# Patient Record
Sex: Male | Born: 1935 | Race: White | Hispanic: No | Marital: Married | Smoking: Former smoker
Health system: Southern US, Community
[De-identification: ages and names within clinical notes are randomized; demographics above are authoritative.]

## PROBLEM LIST (undated history)

## (undated) DIAGNOSIS — R001 Bradycardia, unspecified: Secondary | ICD-10-CM

## (undated) DIAGNOSIS — N183 Chronic kidney disease, stage 3 unspecified: Secondary | ICD-10-CM

## (undated) DIAGNOSIS — Z87891 Personal history of nicotine dependence: Secondary | ICD-10-CM

## (undated) DIAGNOSIS — I5020 Unspecified systolic (congestive) heart failure: Secondary | ICD-10-CM

## (undated) DIAGNOSIS — M1990 Unspecified osteoarthritis, unspecified site: Secondary | ICD-10-CM

## (undated) DIAGNOSIS — E039 Hypothyroidism, unspecified: Secondary | ICD-10-CM

## (undated) DIAGNOSIS — I714 Abdominal aortic aneurysm, without rupture, unspecified: Secondary | ICD-10-CM

## (undated) DIAGNOSIS — N400 Enlarged prostate without lower urinary tract symptoms: Secondary | ICD-10-CM

## (undated) DIAGNOSIS — I428 Other cardiomyopathies: Secondary | ICD-10-CM

## (undated) DIAGNOSIS — I10 Essential (primary) hypertension: Secondary | ICD-10-CM

## (undated) DIAGNOSIS — K859 Acute pancreatitis without necrosis or infection, unspecified: Secondary | ICD-10-CM

## (undated) DIAGNOSIS — I2510 Atherosclerotic heart disease of native coronary artery without angina pectoris: Secondary | ICD-10-CM

## (undated) DIAGNOSIS — I4819 Other persistent atrial fibrillation: Secondary | ICD-10-CM

## (undated) HISTORY — DX: Other persistent atrial fibrillation: I48.19

## (undated) HISTORY — DX: Other cardiomyopathies: I42.8

## (undated) HISTORY — PX: TONSILLECTOMY: SUR1361

## (undated) HISTORY — PX: EYE SURGERY: SHX253

---

## 1978-10-18 HISTORY — PX: CATARACT EXTRACTION W/ INTRAOCULAR LENS  IMPLANT, BILATERAL: SHX1307

## 2000-11-01 ENCOUNTER — Ambulatory Visit (HOSPITAL_COMMUNITY): Admission: RE | Admit: 2000-11-01 | Discharge: 2000-11-01 | Payer: Self-pay | Admitting: Internal Medicine

## 2000-11-01 ENCOUNTER — Encounter: Payer: Self-pay | Admitting: Internal Medicine

## 2001-04-11 ENCOUNTER — Encounter: Payer: Self-pay | Admitting: Urology

## 2001-04-11 ENCOUNTER — Ambulatory Visit (HOSPITAL_COMMUNITY): Admission: RE | Admit: 2001-04-11 | Discharge: 2001-04-11 | Payer: Self-pay | Admitting: Urology

## 2005-12-10 ENCOUNTER — Ambulatory Visit (HOSPITAL_COMMUNITY): Admission: RE | Admit: 2005-12-10 | Discharge: 2005-12-10 | Payer: Self-pay | Admitting: Family Medicine

## 2006-12-07 ENCOUNTER — Ambulatory Visit: Payer: Self-pay | Admitting: Vascular Surgery

## 2007-12-06 ENCOUNTER — Ambulatory Visit: Payer: Self-pay | Admitting: Vascular Surgery

## 2008-04-18 ENCOUNTER — Ambulatory Visit (HOSPITAL_COMMUNITY): Admission: RE | Admit: 2008-04-18 | Discharge: 2008-04-18 | Payer: Self-pay | Admitting: Family Medicine

## 2008-04-19 ENCOUNTER — Ambulatory Visit (HOSPITAL_COMMUNITY): Admission: RE | Admit: 2008-04-19 | Discharge: 2008-04-19 | Payer: Self-pay | Admitting: Family Medicine

## 2009-01-14 ENCOUNTER — Ambulatory Visit: Payer: Self-pay | Admitting: Vascular Surgery

## 2009-07-18 ENCOUNTER — Ambulatory Visit: Payer: Self-pay | Admitting: Vascular Surgery

## 2010-02-26 ENCOUNTER — Ambulatory Visit
Admission: RE | Admit: 2010-02-26 | Discharge: 2010-02-26 | Payer: Self-pay | Source: Home / Self Care | Attending: Vascular Surgery | Admitting: Vascular Surgery

## 2010-03-08 NOTE — Procedures (Unsigned)
DUPLEX ULTRASOUND OF ABDOMINAL AORTA  INDICATION:  AAA.  HISTORY: Diabetes:  No. Cardiac:  No. Hypertension:  No. Smoking:  No. Connective Tissue Disorder: Family History:  No. Previous Surgery:  No.  DUPLEX EXAM:         AP (cm)                   TRANSVERSE (cm) Proximal             Not visualized            Not visualized Mid                  2.79 cm                   2.69 cm Distal               4.44 cm                   4.54 cm Right Iliac          1.61 cm                   1.69 cm Left Iliac           1.50 cm                   1.61 cm  PREVIOUS:  Date: 07/18/2009  AP:  4.5  TRANSVERSE:  4.4  IMPRESSION: 1. Aneurysmal dilatation of the distal abdominal aorta that measures     4.44 X 4.54 cm. 2. Difficult to image due to excessive bowel gas. 3. Appears stable from previous study.  ___________________________________________ Di Kindle. Edilia Bo, M.D.  EM/MEDQ  D:  02/26/2010  T:  02/26/2010  Job:  [id number]

## 2010-07-01 NOTE — Assessment & Plan Note (Signed)
OFFICE VISIT   Diaz, Connor R  DOB:  January 08, 1924                                       07/18/2009  GNFAO#:[id number]   I saw the patient in the office today for continued followup of his  abdominal aortic aneurysm.  He had been coming in for ultrasounds at 6-  month intervals.  His most recent study was January 14, 2009, at which  time the aneurysm measured 4.7 cm in maximum diameter.  He comes in for  65-month followup visit.  Since he was seen last, he has had no abdominal  or back pain.  He does have a history of hypertension which has been  stable on his current medications.  In addition, he has significant  arthritis in the right knee which has also been stable on his current  medications.   On social history, he is married.  He has 3 children.  He has a remote  history of tobacco use but has not smoked in many years.   On review of systems:  CARDIOVASCULAR:  He had no chest pain, chest pressure, palpitations, or  arrhythmias.  He has had no claudication, rest pain, or nonhealing  ulcers.  He had no history of stroke, TIAs.  He has had no history of  DVT.  NEUROLOGIC:  He has had no dizziness, blackouts, headaches, or seizures.   On physical examination, this is a pleasant 77 year old gentleman who  appears younger than his stated age.  Blood pressure is 145/77, heart  rate is 70, saturation 99%.  HEENT is unremarkable.  Lungs are clear  bilaterally to auscultation without rales, rhonchi, or wheezing.  On  cardiovascular exam, I do not detect any carotid bruits.  He has a  regular rate and rhythm.  He has palpable femoral pulses and palpable  dorsalis pedis pulses bilaterally.  Abdomen is soft and  nontender.  It  is difficult to palpate his aneurysm but he is nontender.  He has normal  pitched bowel sounds.  Neurologic exam:  He has no focal weakness or  paresthesias.   I have independently interpreted his duplex scan of his aneurysm which  shows the maximum diameter of his distal aorta as 4.5 cm.  He has some  slight ectasia of his iliac arteries but no frank aneurysms.  I have  compared this study to his previous study which shows no significant  change in the size of his aneurysm.   I have explained that we typically would not recommend elective repair  unless the aneurysm reached 5.5 cm in maximum diameter.  I have ordered  a followup ultrasound in 6 months.  I will see him on a yearly basis,  however, unless the aneurysm shows significant enlargement on his next  followup study.  He knows to call sooner if he has problems.     Di Kindle. Edilia Bo, M.D.  Electronically Signed   CSD/MEDQ  D:  07/18/2009  T:  07/19/2009  Job:  [id number]

## 2010-07-01 NOTE — Procedures (Signed)
DUPLEX ULTRASOUND OF ABDOMINAL AORTA   INDICATION:  Follow up of abdominal aortic aneurysm.   HISTORY:  Diabetes:  no  Cardiac:  no  Hypertension:  yes  Smoking:  no  Connective Tissue Disorder:  Family History:  no  Previous Surgery:  No   DUPLEX EXAM:         AP (cm)                   TRANSVERSE (cm)  Proximal             2.9 cm                    2.4 cm  Mid                  4.46 cm                   4.76 cm  Distal               3.27 cm                   3.76 cm  Right Iliac          1.84 cm                   1.78 cm  Left Iliac           1.52 cm                   1.57 cm   PREVIOUS:  Date: 12/06/2007  AP:  4.24  TRANSVERSE:  4.30   IMPRESSION:  Abdominal aortic aneurysm with largest measurement of 4.46  x 4.76 cm.   ___________________________________________  Di Kindle. Edilia Bo, M.D.   CJ/MEDQ  D:  01/14/2009  T:  01/14/2009  Job:  [id number]

## 2010-07-01 NOTE — Procedures (Signed)
DUPLEX ULTRASOUND OF ABDOMINAL AORTA   INDICATION:  Follow-up abdominal aortic aneurysm.   HISTORY:  Diabetes:  No.  Cardiac:  No.  Hypertension:  Yes.  Smoking:  No.  Connective Tissue Disorder:  Family History:  Previous Surgery:   DUPLEX EXAM:         AP (cm)                   TRANSVERSE (cm)  Proximal             2.86 cm                   2.87 cm  Mid                  2.42 cm                   4.30 cm  Distal               3.58 cm                   3.65 cm  Right Iliac          Not well visualized  Left Iliac           No well visualized   PREVIOUS:  Date:  AP:  4.04  TRANSVERSE:  4.07   IMPRESSION:  Abdominal aortic aneurysm noted with the largest  measurement of (4.24 cm x 4.3 cm).   ___________________________________________  Di Kindle. Edilia Bo, M.D.   MG/MEDQ  D:  12/06/2007  T:  12/06/2007  Job:  [id number]

## 2010-07-01 NOTE — Procedures (Signed)
DUPLEX ULTRASOUND OF ABDOMINAL AORTA   INDICATION:  Followup abdominal aortic aneurysm.   HISTORY:  Diabetes:  No  Cardiac:  No  Hypertension:  Yes  Smoking:  No  Family History:  No  Previous Surgery:  No   DUPLEX EXAM:         AP (cm)                   TRANSVERSE (cm)  Proximal             2.74 cm                   2.86 cm  Mid                  4.04 cm                   4.07 cm  Distal               3.02 cm                   2.92 cm  Right Iliac          1.56 cm                   1.40 cm  Left Iliac           1.40 cm                   1.65 cm   PREVIOUS:  Date: 12/02/2005  AP:  4.04  TRANSVERSE:  4.00   IMPRESSION:  Abdominal aortic aneurysm repair stable from previous  study.   ___________________________________________  Di Kindle. Edilia Bo, M.D.   AS/MEDQ  D:  12/07/2006  T:  12/08/2006  Job:  [id number]

## 2011-11-18 ENCOUNTER — Inpatient Hospital Stay (HOSPITAL_COMMUNITY)
Admission: EM | Admit: 2011-11-18 | Discharge: 2011-11-23 | DRG: 244 | Disposition: A | Discharge status: Home / Self Care | Payer: Medicare Other | Attending: Cardiology | Admitting: Cardiology

## 2011-11-18 ENCOUNTER — Encounter (HOSPITAL_COMMUNITY): Payer: Self-pay

## 2011-11-18 ENCOUNTER — Other Ambulatory Visit: Payer: Self-pay

## 2011-11-18 ENCOUNTER — Emergency Department (HOSPITAL_COMMUNITY): Payer: Medicare Other

## 2011-11-18 DIAGNOSIS — R079 Chest pain, unspecified: Secondary | ICD-10-CM

## 2011-11-18 DIAGNOSIS — I4891 Unspecified atrial fibrillation: Secondary | ICD-10-CM | POA: Diagnosis present

## 2011-11-18 DIAGNOSIS — I129 Hypertensive chronic kidney disease with stage 1 through stage 4 chronic kidney disease, or unspecified chronic kidney disease: Secondary | ICD-10-CM | POA: Diagnosis present

## 2011-11-18 DIAGNOSIS — I714 Abdominal aortic aneurysm, without rupture, unspecified: Secondary | ICD-10-CM | POA: Diagnosis present

## 2011-11-18 DIAGNOSIS — I209 Angina pectoris, unspecified: Secondary | ICD-10-CM | POA: Diagnosis present

## 2011-11-18 DIAGNOSIS — Z87891 Personal history of nicotine dependence: Secondary | ICD-10-CM

## 2011-11-18 DIAGNOSIS — N182 Chronic kidney disease, stage 2 (mild): Secondary | ICD-10-CM | POA: Diagnosis present

## 2011-11-18 DIAGNOSIS — I2510 Atherosclerotic heart disease of native coronary artery without angina pectoris: Secondary | ICD-10-CM | POA: Diagnosis present

## 2011-11-18 DIAGNOSIS — I495 Sick sinus syndrome: Principal | ICD-10-CM | POA: Diagnosis present

## 2011-11-18 HISTORY — DX: Benign prostatic hyperplasia without lower urinary tract symptoms: N40.0

## 2011-11-18 HISTORY — DX: Chronic kidney disease, stage 3 (moderate): N18.3

## 2011-11-18 HISTORY — DX: Personal history of nicotine dependence: Z87.891

## 2011-11-18 HISTORY — DX: Abdominal aortic aneurysm, without rupture: I71.4

## 2011-11-18 HISTORY — DX: Unspecified osteoarthritis, unspecified site: M19.90

## 2011-11-18 HISTORY — DX: Acute pancreatitis without necrosis or infection, unspecified: K85.90

## 2011-11-18 HISTORY — DX: Abdominal aortic aneurysm, without rupture, unspecified: I71.40

## 2011-11-18 HISTORY — DX: Bradycardia, unspecified: R00.1

## 2011-11-18 HISTORY — DX: Essential (primary) hypertension: I10

## 2011-11-18 HISTORY — DX: Chronic kidney disease, stage 3 unspecified: N18.30

## 2011-11-18 LAB — BASIC METABOLIC PANEL
CO2: 29 mEq/L (ref 19–32)
Chloride: 96 mEq/L (ref 96–112)
Creatinine, Ser: 1.4 mg/dL — ABNORMAL HIGH (ref 0.50–1.35)
Glucose, Bld: 111 mg/dL — ABNORMAL HIGH (ref 70–99)
Sodium: 134 mEq/L — ABNORMAL LOW (ref 135–145)

## 2011-11-18 LAB — CBC WITH DIFFERENTIAL/PLATELET
Basophils Absolute: 0 10*3/uL (ref 0.0–0.1)
Eosinophils Absolute: 0.4 10*3/uL (ref 0.0–0.7)
Eosinophils Relative: 4 % (ref 0–5)
HCT: 41.1 % (ref 39.0–52.0)
Lymphocytes Relative: 29 % (ref 12–46)
Lymphs Abs: 3.6 10*3/uL (ref 0.7–4.0)
Lymphs Abs: 2.1 10*3/uL (ref 0.7–4.0)
MCH: 33.6 pg (ref 26.0–34.0)
MCHC: 35.2 g/dL (ref 30.0–36.0)
MCV: 95.5 fL (ref 78.0–100.0)
Monocytes Absolute: 1.2 10*3/uL — ABNORMAL HIGH (ref 0.1–1.0)
Neutro Abs: 6.9 10*3/uL (ref 1.7–7.7)
Platelets: 190 10*3/uL (ref 150–400)
RBC: 4.29 MIL/uL (ref 4.22–5.81)
RBC: 3.75 MIL/uL — ABNORMAL LOW (ref 4.22–5.81)
RDW: 13 % (ref 11.5–15.5)
WBC: 12.4 10*3/uL — ABNORMAL HIGH (ref 4.0–10.5)

## 2011-11-18 LAB — COMPREHENSIVE METABOLIC PANEL
AST: 16 U/L (ref 0–37)
CO2: 29 mEq/L (ref 19–32)
Calcium: 11.1 mg/dL — ABNORMAL HIGH (ref 8.4–10.5)
Creatinine, Ser: 1.44 mg/dL — ABNORMAL HIGH (ref 0.50–1.35)
GFR calc Af Amer: 49 mL/min — ABNORMAL LOW (ref >90)
GFR calc non Af Amer: 42 mL/min — ABNORMAL LOW (ref >90)
Glucose, Bld: 105 mg/dL — ABNORMAL HIGH (ref 70–99)

## 2011-11-18 LAB — TROPONIN I
Troponin I: <0.3 ng/mL (ref <0.30)
Troponin I: <0.3 ng/mL (ref <0.30)
Troponin I: <0.3 ng/mL (ref <0.30)

## 2011-11-18 LAB — MRSA PCR SCREENING: MRSA by PCR: NEGATIVE

## 2011-11-18 LAB — PROTIME-INR: Prothrombin Time: 14.1 seconds (ref 11.6–15.2)

## 2011-11-18 LAB — TSH: TSH: 1.655 u[IU]/mL (ref 0.350–4.500)

## 2011-11-18 MED ORDER — NITROGLYCERIN 0.4 MG SL SUBL: 0.4000 mg | SUBLINGUAL_TABLET | SUBLINGUAL | Status: DC | PRN

## 2011-11-18 MED ORDER — SODIUM CHLORIDE 0.9 % IV SOLN
1.0000 mL/kg/h | INTRAVENOUS | Status: DC
  Administered 2011-11-18: 1 mL/kg/h via INTRAVENOUS

## 2011-11-18 MED ORDER — ACETAMINOPHEN 325 MG PO TABS: 650.0000 mg | ORAL_TABLET | ORAL | Status: DC | PRN

## 2011-11-18 MED ORDER — SODIUM CHLORIDE 0.9 % IV SOLN: 250.0000 mL | INTRAVENOUS | Status: DC | PRN

## 2011-11-18 MED ORDER — ASPIRIN EC 81 MG PO TBEC
81.0000 mg | DELAYED_RELEASE_TABLET | Freq: Every day | ORAL | Status: DC
  Administered 2011-11-21 – 2011-11-23 (×3): 81 mg via ORAL
  Filled 2011-11-18 (×5): qty 1

## 2011-11-18 MED ORDER — ASPIRIN 81 MG PO CHEW
324.0000 mg | CHEWABLE_TABLET | ORAL | Status: AC
  Administered 2011-11-19: 324 mg via ORAL
  Filled 2011-11-18 (×2): qty 4

## 2011-11-18 MED ORDER — ONDANSETRON HCL 4 MG/2ML IJ SOLN
4.0000 mg | Freq: Once | INTRAMUSCULAR | Status: AC
  Administered 2011-11-18: 4 mg via INTRAVENOUS
  Filled 2011-11-18: qty 2

## 2011-11-18 MED ORDER — ASPIRIN 81 MG PO CHEW
324.0000 mg | CHEWABLE_TABLET | Freq: Once | ORAL | Status: AC
  Administered 2011-11-18: 324 mg via ORAL
  Filled 2011-11-18: qty 4

## 2011-11-18 MED ORDER — NITROGLYCERIN IN D5W 200-5 MCG/ML-% IV SOLN: 5.0000 ug/min | Freq: Once | INTRAVENOUS | Status: DC

## 2011-11-18 MED ORDER — NITROGLYCERIN IN D5W 200-5 MCG/ML-% IV SOLN
5.0000 ug/min | Freq: Once | INTRAVENOUS | Status: AC
  Administered 2011-11-18: 5 ug/min via INTRAVENOUS
  Filled 2011-11-18: qty 250

## 2011-11-18 MED ORDER — ATORVASTATIN CALCIUM 80 MG PO TABS
80.0000 mg | ORAL_TABLET | Freq: Every day | ORAL | Status: DC
  Administered 2011-11-18 – 2011-11-22 (×5): 80 mg via ORAL
  Filled 2011-11-18 (×7): qty 1

## 2011-11-18 MED ORDER — MORPHINE SULFATE 4 MG/ML IJ SOLN
4.0000 mg | Freq: Once | INTRAMUSCULAR | Status: AC
  Administered 2011-11-18: 4 mg via INTRAVENOUS
  Filled 2011-11-18: qty 1

## 2011-11-18 MED ORDER — SODIUM CHLORIDE 0.9 % IJ SOLN: 3.0000 mL | INTRAMUSCULAR | Status: DC | PRN

## 2011-11-18 MED ORDER — ASPIRIN 81 MG PO CHEW
324.0000 mg | CHEWABLE_TABLET | ORAL | Status: AC
  Administered 2011-11-19: 324 mg via ORAL

## 2011-11-18 MED ORDER — ONDANSETRON HCL 4 MG/2ML IJ SOLN: 4.0000 mg | Freq: Three times a day (TID) | INTRAMUSCULAR | Status: AC | PRN

## 2011-11-18 MED ORDER — PANTOPRAZOLE SODIUM 40 MG PO TBEC
40.0000 mg | DELAYED_RELEASE_TABLET | Freq: Every day | ORAL | Status: DC
  Administered 2011-11-18 – 2011-11-23 (×7): 40 mg via ORAL
  Filled 2011-11-18 (×6): qty 1

## 2011-11-18 MED ORDER — OMEGA-3 FATTY ACIDS 1000 MG PO CAPS: 1.0000 g | ORAL_CAPSULE | Freq: Every day | ORAL | Status: DC

## 2011-11-18 MED ORDER — ALUM & MAG HYDROXIDE-SIMETH 200-200-20 MG/5ML PO SUSP
30.0000 mL | ORAL | Status: DC | PRN
  Administered 2011-11-18: 30 mL via ORAL
  Filled 2011-11-18: qty 30

## 2011-11-18 MED ORDER — MORPHINE SULFATE 2 MG/ML IJ SOLN: 2.0000 mg | INTRAMUSCULAR | Status: DC | PRN

## 2011-11-18 MED ORDER — SODIUM CHLORIDE 0.9 % IJ SOLN
3.0000 mL | Freq: Two times a day (BID) | INTRAMUSCULAR | Status: DC
  Administered 2011-11-18 (×2): 3 mL via INTRAVENOUS
  Administered 2011-11-19: 6 mL via INTRAVENOUS

## 2011-11-18 MED ORDER — HEPARIN (PORCINE) IN NACL 100-0.45 UNIT/ML-% IJ SOLN
950.0000 [IU]/h | INTRAMUSCULAR | Status: DC
  Administered 2011-11-18: 1000 [IU]/h via INTRAVENOUS
  Filled 2011-11-18 (×3): qty 250

## 2011-11-18 MED ORDER — HEPARIN BOLUS VIA INFUSION
4000.0000 [IU] | Freq: Once | INTRAVENOUS | Status: AC
  Administered 2011-11-18: 4000 [IU] via INTRAVENOUS

## 2011-11-18 MED ORDER — NITROGLYCERIN IN D5W 200-5 MCG/ML-% IV SOLN: 5.0000 ug/min | INTRAVENOUS | Status: DC

## 2011-11-18 MED ORDER — ASPIRIN 300 MG RE SUPP
300.0000 mg | RECTAL | Status: AC
  Filled 2011-11-18: qty 1

## 2011-11-18 MED ORDER — ATROPINE SULFATE 1 MG/ML IJ SOLN
INTRAMUSCULAR | Status: AC
  Filled 2011-11-18: qty 1

## 2011-11-18 MED ORDER — NITROGLYCERIN 0.4 MG SL SUBL
0.4000 mg | SUBLINGUAL_TABLET | Freq: Once | SUBLINGUAL | Status: AC
  Administered 2011-11-18: 0.4 mg via SUBLINGUAL
  Filled 2011-11-18: qty 25

## 2011-11-18 MED ORDER — OMEGA-3-ACID ETHYL ESTERS 1 G PO CAPS
1.0000 g | ORAL_CAPSULE | Freq: Every day | ORAL | Status: DC
Start: 2011-11-18 — End: 2011-11-23
  Administered 2011-11-18 – 2011-11-23 (×5): 1 g via ORAL
  Filled 2011-11-18 (×6): qty 1

## 2011-11-18 MED ORDER — ONDANSETRON HCL 4 MG/2ML IJ SOLN: 4.0000 mg | Freq: Four times a day (QID) | INTRAMUSCULAR | Status: DC | PRN

## 2011-11-18 NOTE — ED Notes (Signed)
Report given to [facility] RN with [facility].

## 2011-11-18 NOTE — Progress Notes (Signed)
ANTICOAGULATION CONSULT NOTE - Initial Consult  Pharmacy Consult for Heparin Indication: chest pain/ACS  No Known Allergies  Patient Measurements: Height: 5\' 4"  (162.6 cm) Weight: 184 lb (83.462 kg) IBW/kg (Calculated) : 59.2  Heparin Dosing Weight: 83 kg  Vital Signs: Temp: 97.5 F (36.4 C) (10/02 0812) Temp src: Oral (10/02 0812) BP: 106/68 mmHg (10/02 0835) Pulse Rate: 95  (10/02 0835)  Labs:  Basename 11/18/11 0809  HGB 14.2  HCT 41.1  PLT 209  APTT --  LABPROT --  INR --  HEPARINUNFRC --  CREATININE 1.40*  CKTOTAL --  CKMB --  TROPONINI <0.30    Estimated Creatinine Clearance: 36.9 ml/min (by C-G formula based on Cr of 1.4).   Medical History: Past Medical History  Diagnosis Date  . Hypertension   . Arthritis     Medications:  Scheduled:    . aspirin  324 mg Oral Once  . heparin  4,000 Units Intravenous Once  . morphine  4 mg Intravenous Once  . nitroGLYCERIN  0.4 mg Sublingual Once  . nitroGLYCERIN  5-200 mcg/min Intravenous Once  . ondansetron  4 mg Intravenous Once    Assessment: Chest pain Heparin per protocol for ACS/STEMI Labs acceptable  Goal of Therapy:  Heparin level 0.3-0.7 units/ml Monitor platelets by anticoagulation protocol: Yes   Plan:  Give 4000 units bolus x 1 Start heparin infusion at 1000 units/hr Check anti-Xa level in 8 hours and daily while on heparin Continue to monitor H&H and platelets  Raquel James, Sanjna Haskew Bennett 11/18/2011,9:58 AM

## 2011-11-18 NOTE — H&P (Signed)
Connor Diaz is an 78 y.o. male.   Chief Complaint: Chest pain HPI: 78 year old male with past medical history significant for hypertension, abdominal aortic aneurysm, degenerative joint disease, remote tobacco abuse, was transferred from [facility] ER. Patient went to the ER from PMDs office because of retrosternal chest pain off and on for last 2 days described as pressure heaviness as if someone is sitting on the chest associated with feeling tired weak fatigued and mild shortness of breath. Patient was noted to be in atrial fibrillation with moderate white blood response and was noted to have occasional A. fib with slow ventricular response heart rate in 40s. Patient denies any palpitations lightheadedness or syncopal episode . Patient received IV heparin and nitrates in ER and transferred to [facility]. Patient presently denies any chest pain. Denies any cardiac workup in the past. Denies any weakness in the arms or legs. Denies any thyroid problems.  Past Medical History  Diagnosis Date  . Hypertension   . Arthritis     Past Surgical History  Procedure Date  . Tonsillectomy     No family history on file. Social History:  reports that he has never smoked. He does not have any smokeless tobacco history on file. He reports that he does not drink alcohol or use illicit drugs.  Allergies: No Known Allergies  Medications Prior to Admission  Medication Sig Dispense Refill  . dutasteride (AVODART) 0.5 MG capsule Take 0.5 mg by mouth daily.      . fish oil-omega-3 fatty acids 1000 MG capsule Take 1 g by mouth daily.      . naproxen (NAPROSYN) 500 MG tablet Take 500 mg by mouth 2 (two) times daily.      Marland Kitchen olmesartan-hydrochlorothiazide (BENICAR HCT) 40-12.5 MG per tablet Take 1 tablet by mouth daily.        Results for orders placed during the hospital encounter of 11/18/11 (from the past 48 hour(s))  CBC WITH DIFFERENTIAL     Status: Abnormal   Collection Time   11/18/11  8:09 AM    Component Value Range Comment   WBC 12.4 (*) 4.0 - 10.5 K/uL    RBC 4.29  4.22 - 5.81 MIL/uL    Hemoglobin 14.2  13.0 - 17.0 g/dL    HCT 16.1  09.6 - 04.5 %    MCV 95.8  78.0 - 100.0 fL    MCH 33.1  26.0 - 34.0 pg    MCHC 34.5  30.0 - 36.0 g/dL    RDW 40.9  81.1 - 91.4 %    Platelets 209  150 - 400 K/uL    Neutrophils Relative 55  43 - 77 %    Neutro Abs 6.9  1.7 - 7.7 K/uL    Lymphocytes Relative 29  12 - 46 %    Lymphs Abs 3.6  0.7 - 4.0 K/uL    Monocytes Relative 9  3 - 12 %    Monocytes Absolute 1.2 (*) 0.1 - 1.0 K/uL    Eosinophils Relative 6 (*) 0 - 5 %    Eosinophils Absolute 0.8 (*) 0.0 - 0.7 K/uL    Basophils Relative 0  0 - 1 %    Basophils Absolute 0.0  0.0 - 0.1 K/uL   BASIC METABOLIC PANEL     Status: Abnormal   Collection Time   11/18/11  8:09 AM      Component Value Range Comment   Sodium 134 (*) 135 - 145 mEq/L  Potassium 4.2  3.5 - 5.1 mEq/L    Chloride 96  96 - 112 mEq/L    CO2 29  19 - 32 mEq/L    Glucose, Bld 111 (*) 70 - 99 mg/dL    BUN 36 (*) 6 - 23 mg/dL    Creatinine, Ser 1.61 (*) 0.50 - 1.35 mg/dL    Calcium 09.6 (*) 8.4 - 10.5 mg/dL    GFR calc non Af Amer 44 (*) >90 mL/min    GFR calc Af Amer 51 (*) >90 mL/min   TROPONIN I     Status: Normal   Collection Time   11/18/11  8:09 AM      Component Value Range Comment   Troponin I <0.30  <0.30 ng/mL    Dg Chest Portable 1 View  11/18/2011  *RADIOLOGY REPORT*  Clinical Data: Chest pain  PORTABLE CHEST - 1 VIEW  Comparison: Acute abdominal series 05/27/2011  Findings: Heart size appears mildly enlarged and stable.  The patient is rotated slightly to the right.  There are probable bilateral nipple shadows (the right nipple shadow projects over the soft tissues lateral to the right rib cage, and the left nipple shadow projects over the left lung.  There are emphysematous changes bilaterally.  Mild peribronchial thickening at the lung bases appears unchanged from prior study.  No airspace disease, effusion,  or pneumothorax is identified.  IMPRESSION:  1.  No acute findings. 2.  Emphysema. 3.  Probable bilateral nipple shadows.  This could be confirmed with follow-up chest radiograph with nipple markers in place.   Original Report Authenticated By: Britta Mccreedy, M.D.     Review of Systems  Constitutional: Negative for fever, chills and weight loss.  Eyes: Negative for blurred vision and double vision.  Respiratory: Positive for shortness of breath. Negative for cough, hemoptysis, sputum production and wheezing.   Cardiovascular: Positive for chest pain. Negative for palpitations, orthopnea and claudication.  Gastrointestinal: Positive for nausea. Negative for heartburn, vomiting and abdominal pain.  Neurological: Negative for dizziness and headaches.    Blood pressure 125/53, pulse 82, temperature 97.5 F (36.4 C), temperature source Oral, resp. rate 18, height 5\' 11"  (1.803 m), weight 81.8 kg (180 lb 5.4 oz), SpO2 97.00%. Physical Exam  Constitutional: He is oriented to person, place, and time. He appears well-developed and well-nourished.  HENT:  Head: Normocephalic and atraumatic.  Nose: Nose normal.  Eyes: Conjunctivae normal are normal. Left eye exhibits no discharge. No scleral icterus.  Neck: Normal range of motion. Neck supple. No JVD present. No tracheal deviation present. No thyromegaly present.  Cardiovascular:       Irregularly irregular S1 and S2 soft there is soft systolic murmur no S3 gallop  Respiratory: Effort normal and breath sounds normal. No respiratory distress. He has no rales.  GI: Soft. Bowel sounds are normal. He exhibits no distension. There is no tenderness. There is no rebound.  Musculoskeletal: He exhibits no edema and no tenderness.  Neurological: He is alert and oriented to person, place, and time.     Assessment/Plan Acute coronary syndrome New-onset A. fib Probable tachybradycardia syndrome Hypertension Abdominal aortic aneurysm Mild renal  insufficiency Remote tobacco abuse Degenerative joint disease Plan As per orders Discussed with patient regarding left cath possible PTCA stenting its risk and benefits i.e. death MI stroke need for emergency CABG risk of restenosis local vascular complications etc. and consents for PCI Also discussed with patient regarding possible permanent pacemaker in view of tachybradycardia syndrome. We will get  EP consult  [name] 11/18/2011, 12:08 PM

## 2011-11-18 NOTE — Progress Notes (Signed)
Latest potassium level- 5.6, MD aware with no order.

## 2011-11-18 NOTE — ED Notes (Signed)
[facility] at bedside for transport. Patient remains alert/oriented x 4. NAD noted.

## 2011-11-18 NOTE — ED Notes (Signed)
Patient reports pain with Nitro drip is "almost gone and barely there". Nitro drip increased to 7.5 mcg/minute per titration orders. BP 110/61. Patient states nausea is relieved.

## 2011-11-18 NOTE — ED Notes (Signed)
Patient reports pain has "eased" with 1 SL nitro. Patient reports pain 6/10 on NPS. BP 106/68. A Fib on monitor. Will continue to monitor patient.

## 2011-11-18 NOTE — Progress Notes (Signed)
ANTICOAGULATION CONSULT NOTE - Follow Up Consult  Pharmacy Consult for Heparin Indication: chest pain/ACS  No Known Allergies  Patient Measurements: Height: 5\' 11"  (180.3 cm) Weight: 180 lb 5.4 oz (81.8 kg) IBW/kg (Calculated) : 75.3  Heparin Dosing Weight: 82 kg  Vital Signs: Temp: 97.9 F (36.6 C) (10/02 1653) Temp src: Oral (10/02 1653) BP: 114/56 mmHg (10/02 1900) Pulse Rate: 75  (10/02 1900)  Labs:  Basename 11/18/11 1849 11/18/11 1315 11/18/11 1314 11/18/11 0809  HGB -- 12.6* -- 14.2  HCT -- 35.8* -- 41.1  PLT -- 190 -- 209  APTT -- 116* -- --  LABPROT -- 14.1 -- --  INR -- 1.10 -- --  HEPARINUNFRC 0.69 -- -- --  CREATININE -- 1.44* -- 1.40*  CKTOTAL -- -- -- --  CKMB -- -- -- --  TROPONINI <0.30 -- <0.30 <0.30    Estimated Creatinine Clearance: 39.2 ml/min (by C-G formula based on Cr of 1.44).    Assessment: 78 yo who was tx from [facility] for CP as well as afib. IV heparin was started at [facility]. Heparin level high-end goal range on 1000 units/hr, initially also received a bolus of 4000 units. No bleeding noted per chart.    Goal of Therapy:  Heparin level 0.3-0.7 units/ml Monitor platelets by anticoagulation protocol: Yes   Plan:  - Decrease heparin rate slightly to 950 units/hr to avoid accumulation - f/u Am heparin level and CBC   Bayard Hugger, PharmD, BCPS  Clinical Pharmacist  Pager: [phone]  11/18/2011,8:06 PM

## 2011-11-18 NOTE — ED Notes (Signed)
Complain of chest pain for two days. States it is worse today. Also, nausea this morning.

## 2011-11-18 NOTE — Progress Notes (Signed)
*  PRELIMINARY RESULTS* Echocardiogram 2D Echocardiogram has been performed.  Jeryl Columbia 11/18/2011, 4:54 PM

## 2011-11-18 NOTE — ED Notes (Signed)
Report given to [place], RN unit [id number] at [facility]. Awaiting [facility] for transport.

## 2011-11-18 NOTE — Progress Notes (Addendum)
Heart rates fluctuating to 40's and 80's, asymptomatic, MD aware.

## 2011-11-18 NOTE — ED Notes (Signed)
Patient left ED at this time with [facility]. 

## 2011-11-18 NOTE — ED Provider Notes (Signed)
History  This chart was scribed for Connor Booze, MD by Shari Heritage. The patient was seen in room APA19/APA19. Patient's care was started at 0805.     CSN: [id number]  Arrival date & time 11/18/11  7829   First MD Initiated Contact with Patient 11/18/11 0805      Chief Complaint  Patient presents with  . Chest Pain    The history is provided by the patient. No language interpreter was used.    HPI Comments: [place] is a 78 y.o. male with h/o HTN who presents to the Emergency Department complaining of gradually worsening, constant, pressure-like, moderate chest pain onset 1 day ago. There is associated nausea. Patient denies diaphoresis. Patient rates the pain as 5/10. Patient says that pain is relieved minimally when he is sitting up. Patient has taken Tums and Gas X and states that Tums provided some mild relief. He says that he is not nauseated at this time, but was a few hours ago. Patient's most recent dosage of aspirin 81 mg was yesterday. Other medical history includes arthritis. He does not smoke.  PCP - Janna Arch  Past Medical History  Diagnosis Date  . Hypertension     History reviewed. No pertinent past surgical history.  No family history on file.  History  Substance Use Topics  . Smoking status: Never Smoker   . Smokeless tobacco: Not on file  . Alcohol Use: No      Review of Systems  Constitutional: Negative for diaphoresis.  Cardiovascular: Positive for chest pain.  Gastrointestinal: Positive for nausea.  All other systems reviewed and are negative.    Allergies  Review of patient's allergies indicates no known allergies.  Home Medications  No current outpatient prescriptions on file.  BP 133/79  Pulse 56  Temp 97.5 F (36.4 C) (Oral)  Resp 16  Ht 5\' 4"  (1.626 m)  Wt 184 lb (83.462 kg)  BMI 31.58 kg/m2  SpO2 99%  Physical Exam  Nursing note and vitals reviewed. Constitutional: He is oriented to person, place, and time. He  appears well-developed and well-nourished.  HENT:  Head: Normocephalic and atraumatic.  Eyes: EOM are normal. Pupils are equal, round, and reactive to light.  Neck: Normal range of motion. Neck supple.  Cardiovascular: Normal rate, regular rhythm and normal heart sounds.   No murmur heard. Pulmonary/Chest: Effort normal and breath sounds normal. No respiratory distress. He has no wheezes. He has no rales.  Abdominal: Soft. Bowel sounds are normal. He exhibits no distension. There is no tenderness. There is no rebound.  Musculoskeletal: Normal range of motion.  Neurological: He is alert and oriented to person, place, and time.  Skin: Skin is warm and dry.  Psychiatric: He has a normal mood and affect. His behavior is normal.    ED Course  Procedures (including critical care time) DIAGNOSTIC STUDIES: Oxygen Saturation is 99% on room air, normal by my interpretation.    COORDINATION OF CARE: 8:20am- Patient informed of current plan for treatment and evaluation and agrees with plan at this time.  Results for orders placed during the hospital encounter of 11/18/11  CBC WITH DIFFERENTIAL      Component Value Range   WBC 12.4 (*) 4.0 - 10.5 K/uL   RBC 4.29  4.22 - 5.81 MIL/uL   Hemoglobin 14.2  13.0 - 17.0 g/dL   HCT 56.2  13.0 - 86.5 %   MCV 95.8  78.0 - 100.0 fL   MCH 33.1  26.0 -  34.0 pg   MCHC 34.5  30.0 - 36.0 g/dL   RDW 16.1  09.6 - 04.5 %   Platelets 209  150 - 400 K/uL   Neutrophils Relative 55  43 - 77 %   Neutro Abs 6.9  1.7 - 7.7 K/uL   Lymphocytes Relative 29  12 - 46 %   Lymphs Abs 3.6  0.7 - 4.0 K/uL   Monocytes Relative 9  3 - 12 %   Monocytes Absolute 1.2 (*) 0.1 - 1.0 K/uL   Eosinophils Relative 6 (*) 0 - 5 %   Eosinophils Absolute 0.8 (*) 0.0 - 0.7 K/uL   Basophils Relative 0  0 - 1 %   Basophils Absolute 0.0  0.0 - 0.1 K/uL  BASIC METABOLIC PANEL      Component Value Range   Sodium 134 (*) 135 - 145 mEq/L   Potassium 4.2  3.5 - 5.1 mEq/L   Chloride 96  96  - 112 mEq/L   CO2 29  19 - 32 mEq/L   Glucose, Bld 111 (*) 70 - 99 mg/dL   BUN 36 (*) 6 - 23 mg/dL   Creatinine, Ser 4.09 (*) 0.50 - 1.35 mg/dL   Calcium 81.1 (*) 8.4 - 10.5 mg/dL   GFR calc non Af Amer 44 (*) >90 mL/min   GFR calc Af Amer 51 (*) >90 mL/min  TROPONIN I      Component Value Range   Troponin I <0.30  <0.30 ng/mL     Dg Chest Portable 1 View  11/18/2011  *RADIOLOGY REPORT*  Clinical Data: Chest pain  PORTABLE CHEST - 1 VIEW  Comparison: Acute abdominal series 05/27/2011  Findings: Heart size appears mildly enlarged and stable.  The patient is rotated slightly to the right.  There are probable bilateral nipple shadows (the right nipple shadow projects over the soft tissues lateral to the right rib cage, and the left nipple shadow projects over the left lung.  There are emphysematous changes bilaterally.  Mild peribronchial thickening at the lung bases appears unchanged from prior study.  No airspace disease, effusion, or pneumothorax is identified.  IMPRESSION:  1.  No acute findings. 2.  Emphysema. 3.  Probable bilateral nipple shadows.  This could be confirmed with follow-up chest radiograph with nipple markers in place.   Original Report Authenticated By: Britta Mccreedy, M.D.     Date: 11/18/2011  Rate: 97  Rhythm: normal sinus rhythm and premature atrial contractions (PAC)  QRS Axis: normal  Intervals: PR prolonged  ST/T Wave abnormalities: normal  Conduction Disutrbances:first-degree A-V block  and right bundle branch block  Narrative Interpretation: First agree AV block, atrial bigeminy, right bundle branch block. No prior ECG available for comparison. Please note, computer reading of ECG states atrial fibrillation which is incorrect.  Old EKG Reviewed: none available    1. Chest pain    CRITICAL CARE Performed by: BJYNW,GNFAO   Total critical care time: 35 minutes  Critical care time was exclusive of separately billable procedures and treating other  patients.  Critical care was necessary to treat or prevent imminent or life-threatening deterioration.  Critical care was time spent personally by me on the following activities: development of treatment plan with patient and/or surrogate as well as nursing, discussions with consultants, evaluation of patient's response to treatment, examination of patient, obtaining history from patient or surrogate, ordering and performing treatments and interventions, ordering and review of laboratory studies, ordering and review of radiographic studies, pulse oximetry and re-evaluation  of patient's condition.   MDM  Chest pain worrisome for cardiac pain. He has known atherosclerotic disease with aortic aneurysm. Prior records are reviewed and he has been followed in vascular surgery clinic for an aortic aneurysm which has been stable over time and currently measures about 4.5 cm. His symptoms do not sound suspicious for dissecting aneurysm. He'll be given aspirin and nitroglycerin.  There is only minimal relief from nitroglycerin. He will be given morphine and will need to be transferred to [facility] for admission and cardiac evaluation.  He got complete relief of pain with IV morphine. Case is discussed with Dr. [name], on call for cardiology, who requests he be started on IV nitroglycerin and IV heparin. He'll be transferred to [facility] and admitted to the step down unit.  I personally performed the services described in this documentation, which was scribed in my presence. The recorded information has been reviewed and considered.      Connor Booze, MD 11/18/11 [phone]

## 2011-11-19 ENCOUNTER — Encounter (HOSPITAL_COMMUNITY): Payer: Self-pay | Admitting: Nurse Practitioner

## 2011-11-19 ENCOUNTER — Encounter (HOSPITAL_COMMUNITY)
Admission: EM | Disposition: A | Discharge status: Home / Self Care | Payer: Self-pay | Source: Home / Self Care | Attending: Cardiology

## 2011-11-19 DIAGNOSIS — I495 Sick sinus syndrome: Principal | ICD-10-CM

## 2011-11-19 HISTORY — PX: CORONARY ANGIOGRAM: SHX5466

## 2011-11-19 LAB — HEPARIN LEVEL (UNFRACTIONATED): Heparin Unfractionated: 0.46 IU/mL (ref 0.30–0.70)

## 2011-11-19 LAB — CBC
Platelets: 169 10*3/uL (ref 150–400)
RDW: 13.1 % (ref 11.5–15.5)
WBC: 9.1 10*3/uL (ref 4.0–10.5)

## 2011-11-19 LAB — BASIC METABOLIC PANEL
Chloride: 103 mEq/L (ref 96–112)
GFR calc Af Amer: 46 mL/min — ABNORMAL LOW (ref >90)
Potassium: 4.4 mEq/L (ref 3.5–5.1)
Sodium: 136 mEq/L (ref 135–145)

## 2011-11-19 LAB — LIPID PANEL
Cholesterol: 124 mg/dL (ref 0–200)
LDL Cholesterol: 61 mg/dL (ref 0–99)
Triglycerides: 87 mg/dL (ref <150)

## 2011-11-19 LAB — TROPONIN I: Troponin I: <0.3 ng/mL (ref <0.30)

## 2011-11-19 LAB — POCT ACTIVATED CLOTTING TIME: Activated Clotting Time: 139 seconds

## 2011-11-19 SURGERY — CORONARY ANGIOGRAM

## 2011-11-19 MED ORDER — ASPIRIN 81 MG PO CHEW: 81.0000 mg | CHEWABLE_TABLET | Freq: Every day | ORAL | Status: DC

## 2011-11-19 MED ORDER — ACETAMINOPHEN 325 MG PO TABS: 650.0000 mg | ORAL_TABLET | ORAL | Status: DC | PRN

## 2011-11-19 MED ORDER — NITROGLYCERIN 0.2 MG/ML ON CALL CATH LAB
INTRAVENOUS | Status: AC
  Filled 2011-11-19: qty 1

## 2011-11-19 MED ORDER — SODIUM CHLORIDE 0.9 % IV SOLN
INTRAVENOUS | Status: AC
  Administered 2011-11-19: 18:00:00 via INTRAVENOUS

## 2011-11-19 MED ORDER — HEPARIN (PORCINE) IN NACL 2-0.9 UNIT/ML-% IJ SOLN
INTRAMUSCULAR | Status: AC
  Filled 2011-11-19: qty 1000

## 2011-11-19 MED ORDER — FENTANYL CITRATE 0.05 MG/ML IJ SOLN
INTRAMUSCULAR | Status: AC
  Filled 2011-11-19: qty 2

## 2011-11-19 MED ORDER — ONDANSETRON HCL 4 MG/2ML IJ SOLN: 4.0000 mg | Freq: Four times a day (QID) | INTRAMUSCULAR | Status: DC | PRN

## 2011-11-19 MED ORDER — ATROPINE SULFATE 0.4 MG/ML IJ SOLN
0.5000 mg | Freq: Once | INTRAMUSCULAR | Status: AC
  Administered 2011-11-19: 0.5 mg via INTRAVENOUS
  Filled 2011-11-19: qty 1.25

## 2011-11-19 MED ORDER — NITROGLYCERIN IN D5W 200-5 MCG/ML-% IV SOLN: 5.0000 ug/min | INTRAVENOUS | Status: DC

## 2011-11-19 MED ORDER — LIDOCAINE HCL (PF) 1 % IJ SOLN
INTRAMUSCULAR | Status: AC
  Filled 2011-11-19: qty 30

## 2011-11-19 NOTE — Progress Notes (Signed)
MD Harwani notified at this time of patients increased HR pause lengths (2.40seconds current). Patient asymptomatic with HR's from the 35-37's, SBP 115/61, 98 2L [place]. New orders obtained at this time. Will continue to closely monitor the patient.

## 2011-11-19 NOTE — Progress Notes (Signed)
Subjective:  Patient denies any further chest pain or shortness of breath. Patient had recurrent episodes of marked sinus bradycardia last night requiring IV atropine  Objective:  Vital Signs in the last 24 hours: Temp:  [97.8 F (36.6 C)-98.5 F (36.9 C)] 98.5 F (36.9 C) (10/03 0756) Pulse Rate:  [38-89] 56  (10/03 0800) Resp:  [13-23] 13  (10/03 0800) BP: (95-143)/(49-83) 108/50 mmHg (10/03 0800) SpO2:  [85 %-100 %] 98 % (10/03 0800) Weight:  [81.8 kg (180 lb 5.4 oz)-85.3 kg (188 lb 0.8 oz)] 85.3 kg (188 lb 0.8 oz) (10/03 0500)  Intake/Output from previous day: 10/02 0701 - 10/03 0700 In: 2131.1 [P.O.:360; I.V.:1771.1] Out: 900 [Urine:900] Intake/Output from this shift: Total I/O In: 92.8 [I.V.:92.8] Out: 75 [Urine:75]  Physical Exam: Neck: no adenopathy, no carotid bruit, no JVD and supple, symmetrical, trachea midline Lungs: clear to auscultation bilaterally Heart: regular rate and rhythm, S1, S2 normal and Soft systolic murmur noted no S3 gallop Abdomen: soft, non-tender; bowel sounds normal; no masses,  no organomegaly Extremities: extremities normal, atraumatic, no cyanosis or edema  Lab Results:  Basename 11/19/11 0448 11/18/11 1315  WBC 9.1 10.6*  HGB 11.8* 12.6*  PLT 169 190    Basename 11/19/11 0448 11/18/11 1315  NA 136 134*  K 4.4 5.6*  CL 103 99  CO2 27 29  GLUCOSE 94 105*  BUN 35* 34*  CREATININE 1.51* 1.44*    Basename 11/18/11 2354 11/18/11 1849  TROPONINI <0.30 <0.30   Hepatic Function Panel  Basename 11/18/11 1315  PROT 6.6  ALBUMIN 3.4*  AST 16  ALT 5  ALKPHOS 56  BILITOT 0.4  BILIDIR --  IBILI --    Basename 11/19/11 0448  CHOL 124   No results found for this basename: PROTIME in the last 72 hours  Imaging: Imaging results have been reviewed and Dg Chest Portable 1 View  11/18/2011  *RADIOLOGY REPORT*  Clinical Data: Chest pain  PORTABLE CHEST - 1 VIEW  Comparison: Acute abdominal series 05/27/2011  Findings: Heart size  appears mildly enlarged and stable.  The patient is rotated slightly to the right.  There are probable bilateral nipple shadows (the right nipple shadow projects over the soft tissues lateral to the right rib cage, and the left nipple shadow projects over the left lung.  There are emphysematous changes bilaterally.  Mild peribronchial thickening at the lung bases appears unchanged from prior study.  No airspace disease, effusion, or pneumothorax is identified.  IMPRESSION:  1.  No acute findings. 2.  Emphysema. 3.  Probable bilateral nipple shadows.  This could be confirmed with follow-up chest radiograph with nipple markers in place.   Original Report Authenticated By: Britta Mccreedy, M.D.     Cardiac Studies:  Assessment/Plan:  Stable angina MI ruled out Status post paroxysmal A. fib  Status post symptomatic bradycardia  Sick sinus syndrome Hypertension Abdominal aortic aneurysm Mild renal insufficiency Remote tobacco abuse Degenerative joint disease Plan Discuss with patient and family regarding left eye possible PTCA stenting its risk and benefits and also need for permanent pacemaker and consents for PCI.  LOS: 1 day    Galan Ghee N 11/19/2011, 9:46 AM

## 2011-11-19 NOTE — Progress Notes (Signed)
ANTICOAGULATION CONSULT NOTE - Follow Up Consult  Pharmacy Consult for Heparin Indication: chest pain/ACS  No Known Allergies  Patient Measurements: Heparin Dosing Weight:82kg  Vital Signs: Temp: 97.8 F (36.6 C) (10/03 0400) Temp src: Oral (10/03 0400) BP: 95/49 mmHg (10/03 0400) Pulse Rate: 70  (10/03 0400)  Labs:  Basename 11/19/11 0448 11/18/11 2354 11/18/11 1849 11/18/11 1315 11/18/11 1314 11/18/11 0809  HGB 11.8* -- -- 12.6* -- --  HCT 35.1* -- -- 35.8* -- 41.1  PLT 169 -- -- 190 -- 209  APTT -- -- -- 116* -- --  LABPROT -- -- -- 14.1 -- --  INR -- -- -- 1.10 -- --  HEPARINUNFRC 0.46 -- 0.69 -- -- --  CREATININE -- -- -- 1.44* -- 1.40*  CKTOTAL -- -- -- -- -- --  CKMB -- -- -- -- -- --  TROPONINI -- <0.30 <0.30 -- <0.30 --    Estimated Creatinine Clearance: 39.2 ml/min (by C-G formula based on Cr of 1.44).   Medications:  Heparin @ 950 units/hr  Assessment: 88yom continues on heparin with a therapeutic heparin level. Noted drop in Hgb/Hct/Plts all trending down. No bleeding noted. May go to cath today.  Goal of Therapy:  Heparin level 0.3-0.7 units/ml Monitor platelets by anticoagulation protocol: Yes   Plan:  1) Continue heparin at 950 units/hr 2) Follow up plans for cath  Fredrik Rigger 11/19/2011,7:36 AM

## 2011-11-19 NOTE — Consult Note (Signed)
ELECTROPHYSIOLOGY CONSULT NOTE  Patient ID: [place] MRN: [id number], DOB/AGE: 1923-05-04   Admit date: 11/18/2011 Date of Consult: 11/19/2011  Primary Physician: Connor Stalling, MD Primary Cardiologist: Connor Lull, MD Reason for Consultation: Bradycardia  Past Medical History Past Medical History  Diagnosis Date  . Hypertension   . Arthritis   . AAA (abdominal aortic aneurysm)     a. 02/2009 U/S 4.4 x 4.5 cm  . PAF (paroxysmal atrial fibrillation)     a. noted 11/2011  . Symptomatic bradycardia     a. noted 11/2011  . CKD (chronic kidney disease), stage III   . DJD (degenerative joint disease)   . History of tobacco abuse     a. roughly 36 pack years, quit @ age 32.  Marland Kitchen Pancreatitis     a. 10/2011 - managed conservatively @ home.  Marland Kitchen BPH (benign prostatic hyperplasia)     Past Surgical History Past Surgical History  Procedure Date  . Tonsillectomy     Allergies/Intolerances No Known Allergies  History of Present Illness Connor [place] is an 78 year old gentleman with the above problem list who was in his USOH until approx 2 wks ago when he developed severe lower abdominal pain.  He saw his PCP and was apparently dx with pancreatitis.  He was advised to maintain bowel rest for 5 days and pain was managed with prn narcotics.  His symptoms improved and per wife, f/u blood work showed improvement.  He resumed a normal diet and was doing ok until about 3 days ago when he developed recurrent abdominal pain, though this time it was in his upper abdomen and epigastric area.  It was constant in nature and worse with palpation.  This persisted for a period of 2 days despite "eating tums," and he saw his PCP on the morning of 10/2 after awakening with severe nausea and heaving.  Once seen by his PCP, he was referred to the [facility] ED, where he was treated with ntg w/o relief.  ECG showed sinus with 1st deg AVB and PAC's.  Labs notable for mild renal insuff (creat 1.4) and nl troponin's  and LFT's.  Amylase/lipase not eval.  Because of risk factors and ongoing pain, he was tx to [facility] for further eval.  Here, he has had no further abdominal/epigastric pain.  He was maintained on heparin and IV NTG.  Overnight and this AM, he was noted to have intermittent bradycardia with rates dipping into the mid 30's w/o evidence of high grade heart block (1st deg avb present with pr of 240 msec).  Bradycardia has been asymptomatic.  He has not received any AV nodal blocking agents.  He was given atropine 0.5mg  x 1 earlier this AM for asymp brady into the 30's.  This AM, he underwent cath - results not available at this time but no intervention performed.  We have been asked to eval 2/2 bradycardia.  Inpatient Medications . aspirin  324 mg Oral NOW   Or  . aspirin  300 mg Rectal NOW  . aspirin  324 mg Oral Pre-Cath  . aspirin  81 mg Oral Daily  . aspirin EC  81 mg Oral Daily  . atorvastatin  80 mg Oral q1800  . atropine      . atropine  0.5 mg Intravenous Once  . fentaNYL      . heparin      . lidocaine      . nitroGLYCERIN      . omega-3  acid ethyl esters  1 g Oral Daily  . pantoprazole  40 mg Oral Q0600   . sodium chloride    . nitroGLYCERIN Stopped (11/19/11 0949)  . nitroGLYCERIN     Family History Family History  Problem Relation Age of Onset  . Other Father     Accidental death @ age 72 - box fell on him at work  . Cancer Mother     died @ 38    Social History History   Social History  . Marital Status: Married    Spouse Name: N/A    Number of Children: N/A  . Years of Education: N/A   Occupational History  . Not on file.   Social History Main Topics  . Smoking status: Former Smoker -- 1.0 packs/day for 35 years    Types: Cigarettes  . Smokeless tobacco: Not on file   Comment: Quit at age 53.  Marland Kitchen Alcohol Use: No  . Drug Use: No  . Sexually Active: Not on file   Other Topics Concern  . Not on file   Social History Narrative   Pt lives in [place] with  his wife.  He is retired from [employer].  He is relatively active @ home.   Review of Systems General: No chills, fever, night sweats or weight changes  Cardiovascular: +abdominal/epigastric pain, No sob, edema, orthopnea, palpitations, paroxysmal nocturnal dyspnea, presyncope, syncope. Dermatological: No rash, lesions or masses Respiratory: No cough Urologic: No hematuria, dysuria Abdominal: +++ nausea and dry heaving yesterday morning in setting of 3 day h/o constant abdominal and epigastric pain and tenderness.  No vomiting, diarrhea, bright red blood per rectum, melena, or hematemesis Neurologic: No visual changes, weakness, changes in mental status All other systems reviewed and are otherwise negative except as noted above.  Physical Exam Blood pressure 145/77, pulse 82, temperature 98.2 F (36.8 C), temperature source Oral, resp. rate 15, height 5\' 11"  (1.803 m), weight 188 lb 0.8 oz (85.3 kg), SpO2 94.00%.  General: Well developed, well appearing 78 year old male in no acute distress. HEENT: Normocephalic, atraumatic. EOMs intact. Sclera nonicteric. Oropharynx clear.  Neck: Supple without bruits. No JVD. Lungs: Respirations regular and unlabored, CTA bilaterally. No wheezes, rales or rhonchi. Heart: RRR. S1, S2 present. No murmurs, rub, S3 or S4. Abdomen: Soft, non-tender, non-distended. BS present x 4 quadrants. No hepatosplenomegaly.  Extremities: No clubbing, cyanosis or edema. DP/PT/Radials 2+ and equal bilaterally. Psych: Normal affect. Neuro: Alert and oriented X 3. Moves all extremities spontaneously. Musculoskeletal: No kyphosis. Skin: Intact. Warm and dry. No rashes or petechiae in exposed areas.   Labs  Basename 11/18/11 2354 11/18/11 1849 11/18/11 1314 11/18/11 0809  CKTOTAL -- -- -- --  CKMB -- -- -- --  TROPONINI <0.30 <0.30 <0.30 <0.30   Lab Results  Component Value Date   WBC 9.1 11/19/2011   HGB 11.8* 11/19/2011   HCT 35.1* 11/19/2011   MCV  96.7 11/19/2011   PLT 169 11/19/2011     Lab 11/19/11 0448 11/18/11 1315  NA 136 --  K 4.4 --  CL 103 --  CO2 27 --  BUN 35* --  CREATININE 1.51* --  CALCIUM 9.5 --  PROT -- 6.6  BILITOT -- 0.4  ALKPHOS -- 56  ALT -- 5  AST -- 16  GLUCOSE 94 --    Basename 11/18/11 1315  TSH 1.655  T4TOTAL --  T3FREE --  THYROIDAB --    Basename 11/18/11 1315  INR 1.10  Radiology/Studies Dg Chest Portable 1 View  11/18/2011  *RADIOLOGY REPORT*  Clinical Data: Chest pain  PORTABLE CHEST - 1 VIEW   IMPRESSION:  1.  No acute findings. 2.  Emphysema. 3.  Probable bilateral nipple shadows.  This could be confirmed with follow-up chest radiograph with nipple markers in place.   Original Report Authenticated By: Britta Mccreedy, M.D.    Echocardiogram  None recent in EPIC  12-lead ECG from admission shows SR, 97, RBBB, 1st deg AVB, frequent PAC's 12-lead ECG performed yesterday shows SR with 1st degree AVB, RBBB; PR 280 ms, QRS 164 ms  Telemetry sinus rhythm/sinus brady, 1st deg avb, pac's.  Brady's down to mid 30's @ times without evidence of high grade HB.  Assessment and Plan    Signed, Nicolasa Ducking, NP 11/19/2011, 2:48 PM  EP Attending  Patient seen and examined. He is a pleasant otherwise healthy 78 yo man who up until the past 2 weeks has been healthy. He was found to have sinus node dysfunction with resting HR's in the 30's and RBBB. He underwent catheterization earlier today and was found to have 3 vessel disease with an occluded RCA and severe, heavily calcified disease in the LAD and LCX. He has renal insufficiency and was on no sinus node slowing drugs. In addition, he has had atrial fibrillation with heart rates over 100/min.   A/P 1. Symptomatic (minimally) tachybrady syndrome 2. 3 vessel CAD 3. Stage 2 renal failure 4. HTN Rec: In light of above, I would suggest PPM and uptitration of beta blocker as well as initiation of low dose amiodarone. He will not tolerate  atrial fib, and will be ischemic when he goes into this arrythmia.

## 2011-11-19 NOTE — Progress Notes (Signed)
14 Fr. Foley Catheter placed by T. Renae Gloss, R.N. and C. Rice, R.N.  Patient cleansed with incontinent cleanser and soap and water prior to catheter insertion.  Foley huddle done and insertion agreed to be necessary.  Bladder scanner indicated >932ml in bladder and pt. with c/o abdominal/bladder pain w/difficulty not being able to void.  Pt. now indicated relief.  Clear yellow urine returned in foley tubing.

## 2011-11-20 ENCOUNTER — Encounter (HOSPITAL_COMMUNITY)
Admission: EM | Disposition: A | Discharge status: Home / Self Care | Payer: Self-pay | Source: Home / Self Care | Attending: Cardiology

## 2011-11-20 DIAGNOSIS — I495 Sick sinus syndrome: Secondary | ICD-10-CM

## 2011-11-20 HISTORY — PX: PERMANENT PACEMAKER INSERTION: SHX5480

## 2011-11-20 SURGERY — PERMANENT PACEMAKER INSERTION
Anesthesia: LOCAL

## 2011-11-20 MED ORDER — SODIUM CHLORIDE 0.9 % IV SOLN: 250.0000 mL | INTRAVENOUS | Status: DC

## 2011-11-20 MED ORDER — SODIUM CHLORIDE 0.9 % IJ SOLN: 3.0000 mL | Freq: Two times a day (BID) | INTRAMUSCULAR | Status: DC

## 2011-11-20 MED ORDER — LIDOCAINE HCL (PF) 1 % IJ SOLN
INTRAMUSCULAR | Status: AC
  Filled 2011-11-20: qty 60

## 2011-11-20 MED ORDER — SODIUM CHLORIDE 0.45 % IV SOLN
INTRAVENOUS | Status: DC
  Administered 2011-11-20: 13:00:00 via INTRAVENOUS

## 2011-11-20 MED ORDER — CHLORHEXIDINE GLUCONATE 4 % EX LIQD
60.0000 mL | Freq: Once | CUTANEOUS | Status: AC
  Administered 2011-11-20: 4 via TOPICAL

## 2011-11-20 MED ORDER — SODIUM CHLORIDE 0.9 % IJ SOLN
3.0000 mL | INTRAMUSCULAR | Status: DC | PRN
  Administered 2011-11-22: 3 mL via INTRAVENOUS

## 2011-11-20 MED ORDER — SODIUM CHLORIDE 0.9 % IJ SOLN: 3.0000 mL | INTRAMUSCULAR | Status: DC | PRN

## 2011-11-20 MED ORDER — ONDANSETRON HCL 4 MG/2ML IJ SOLN: 4.0000 mg | Freq: Four times a day (QID) | INTRAMUSCULAR | Status: DC | PRN

## 2011-11-20 MED ORDER — SODIUM CHLORIDE 0.9 % IJ SOLN
3.0000 mL | Freq: Two times a day (BID) | INTRAMUSCULAR | Status: DC
  Administered 2011-11-21 – 2011-11-23 (×5): 3 mL via INTRAVENOUS

## 2011-11-20 MED ORDER — CEFAZOLIN SODIUM 1-5 GM-% IV SOLN
1.0000 g | Freq: Four times a day (QID) | INTRAVENOUS | Status: AC
  Administered 2011-11-20 – 2011-11-21 (×3): 1 g via INTRAVENOUS
  Filled 2011-11-20 (×3): qty 50

## 2011-11-20 MED ORDER — CHLORHEXIDINE GLUCONATE 4 % EX LIQD
60.0000 mL | Freq: Once | CUTANEOUS | Status: AC
  Administered 2011-11-20: 4 via TOPICAL
  Filled 2011-11-20: qty 60

## 2011-11-20 MED ORDER — HEPARIN (PORCINE) IN NACL 2-0.9 UNIT/ML-% IJ SOLN
INTRAMUSCULAR | Status: AC
  Filled 2011-11-20: qty 500

## 2011-11-20 MED ORDER — YOU HAVE A PACEMAKER BOOK
Freq: Once | Status: AC
  Administered 2011-11-20: 19:00:00
  Filled 2011-11-20: qty 1

## 2011-11-20 MED ORDER — SODIUM CHLORIDE 0.9 % IR SOLN
80.0000 mg | Status: DC
  Filled 2011-11-20: qty 2

## 2011-11-20 MED ORDER — HYDROCODONE-ACETAMINOPHEN 5-325 MG PO TABS: 1.0000 | ORAL_TABLET | ORAL | Status: DC | PRN

## 2011-11-20 MED ORDER — SODIUM CHLORIDE 0.9 % IV SOLN: 250.0000 mL | INTRAVENOUS | Status: DC | PRN

## 2011-11-20 MED ORDER — CEFAZOLIN SODIUM-DEXTROSE 2-3 GM-% IV SOLR
2.0000 g | INTRAVENOUS | Status: AC
  Administered 2011-11-20: 2 g via INTRAVENOUS
  Filled 2011-11-20: qty 50

## 2011-11-20 MED ORDER — ACETAMINOPHEN 325 MG PO TABS
325.0000 mg | ORAL_TABLET | ORAL | Status: DC | PRN
  Administered 2011-11-21: 09:00:00 650 mg via ORAL
  Filled 2011-11-20: qty 2

## 2011-11-20 NOTE — Op Note (Signed)
SURGEON:  Hillis Range, MD     PREPROCEDURE DIAGNOSIS:  Symptomatic Bradycardia    POSTPROCEDURE DIAGNOSIS:  Symptomatic Bradycardia     PROCEDURES:   1. Left upper extremity venography.   2. Pacemaker implantation.     INTRODUCTION: [place] is a 78 y.o. male  with a history of bradycardia who presents today for pacemaker implantation.  The patient reports intermittent episodes of dizziness and fatigue.  No reversible causes have been identified.  Given his CAD and prior afib with RVR, he is felt to require beta blocker therapy long term.  The patient therefore presents today for pacemaker implantation.     DESCRIPTION OF PROCEDURE:  Informed written consent was obtained, and the patient was brought to the electrophysiology lab in a fasting state.  The patient required no sedation for the procedure today.  The patients left chest was prepped and draped in the usual sterile fashion by the EP lab staff. The skin overlying the left deltopectoral region was infiltrated with lidocaine for local analgesia.  A 4-cm incision was made over the left deltopectoral region.  A left subcutaneous pacemaker pocket was fashioned using a combination of sharp and blunt dissection. Electrocautery was required to assure hemostasis.    Left Upper Extremity Venography: A venogram of the left upper extremity was performed, which revealed a small left cephalic vein, which emptied into a large left subclavian vein.  The left axillary vein was large in size.    RA/RV Lead Placement: The left axillary vein was therefore directly visualized and cannulated.  Through the left axillary vein, a Public house manager model [id number] (serial number  [id number]) right atrial lead and a [employer] model 1948- 58 (serial number  [id number]) right ventricular lead were advanced with fluoroscopic visualization into the right atrial appendage and right ventricular apex positions respectively.  The right ventricle was quite vertical.    Initial atrial lead P- waves measured 1.7 mV with impedance of 443 ohms and a threshold of 0.8 V at 0.5 msec.  Right ventricular lead R-waves measured 10 mV with an impedance of 751 ohms and a threshold of 0.5 V at 0.5 msec.  Both leads were secured to the pectoralis fascia using #2-0 silk over the suture sleeves.   Device Placement:  The leads were then connected to a [employer] Accent DR RF model [id number] (serial number [id number]) pacemaker.  The pocket was irrigated with copious gentamicin solution.  The pacemaker was then placed into the pocket.  The pocket was then closed in 2 layers with 2.0 Vicryl suture for the subcutaneous and subcuticular layers.  Steri- Strips and a sterile dressing were then applied.  There were no early apparent complications.     CONCLUSIONS:   1. Successful implantation of a Industrial/product designer DR RF dual-chamber pacemaker for symptomatic bradycardia  2. No early apparent complications.           Hillis Range, MD 11/20/2011 3:21 PM

## 2011-11-20 NOTE — Progress Notes (Signed)
Subjective: Appreciate EP consult and help. Patient denies any chest pain or shortness of breath. Denies any palpitations. Scheduled for permanent pacemaker today  Objective:  Vital Signs in the last 24 hours: Temp:  [97.5 F (36.4 C)-98.9 F (37.2 C)] 97.5 F (36.4 C) (10/04 0730) Pulse Rate:  [38-82] 47  (10/04 0730) Resp:  [12-19] 18  (10/04 0730) BP: (90-149)/(37-87) 113/66 mmHg (10/04 0730) SpO2:  [94 %-99 %] 98 % (10/04 0730)  Intake/Output from previous day: 10/03 0701 - 10/04 0700 In: 2186.9 [P.O.:400; I.V.:1786.9] Out: 1450 [Urine:1450] Intake/Output from this shift:    Physical Exam: Neck: no adenopathy, no carotid bruit, no JVD and supple, symmetrical, trachea midline Lungs: clear to auscultation bilaterally Heart: regular rate and rhythm, S1, S2 normal and Soft systolic murmur noted Abdomen: soft, non-tender; bowel sounds normal; no masses,  no organomegaly Extremities: extremities normal, atraumatic, no cyanosis or edema and Right groin stable  Lab Results:  Basename 11/19/11 0448 11/18/11 1315  WBC 9.1 10.6*  HGB 11.8* 12.6*  PLT 169 190    Basename 11/19/11 0448 11/18/11 1315  NA 136 134*  K 4.4 5.6*  CL 103 99  CO2 27 29  GLUCOSE 94 105*  BUN 35* 34*  CREATININE 1.51* 1.44*    Basename 11/18/11 2354 11/18/11 1849  TROPONINI <0.30 <0.30   Hepatic Function Panel  Basename 11/18/11 1315  PROT 6.6  ALBUMIN 3.4*  AST 16  ALT 5  ALKPHOS 56  BILITOT 0.4  BILIDIR --  IBILI --    Basename 11/19/11 0448  CHOL 124   No results found for this basename: PROTIME in the last 72 hours  Imaging: Imaging results have been reviewed and No results found.  Cardiac Studies:  Assessment/Plan:  Stable angina Multivessel coronary artery disease status post left cath Probably tachybradycardia syndrome Status post symptomatic bradycardia Hypertension Abdominal aortic aneurysm Mild renal insufficiency stage II Remote tobacco abuse Degenerative  joint disease Plan Schedule for permanent pacemaker today Agree with beta blockers and low dose amiodarone Continue aspirin statin We'll start him on Plavix in one week once pacemaker site is healed. Patient able to tolerate Plavix for the next few weeks we'll consider staged PCI. Discussed briefly with his wife and patient and will discuss further with family pedis options of treatment. Dr. Algie Coffer on-call for weekend Check renal function in a.m.  LOS: 2 days    Connor Diaz 11/20/2011, 12:10 PM

## 2011-11-20 NOTE — Progress Notes (Signed)
Patient: Connor Diaz Date of Encounter: 11/20/2011, 10:53 AM Admit date: 11/18/2011     Subjective  [name] has no complaints this AM. He states he is ready for PPM.   Objective  Physical Exam: Vitals: BP 113/66  Pulse 47  Temp 97.5 F (36.4 C) (Oral)  Resp 18  Ht 5\' 11"  (1.803 m)  Wt 188 lb 0.8 oz (85.3 kg)  BMI 26.23 kg/m2  SpO2 98% General: Well developed, well appearing 78 year old male in no acute distress. Neck: Supple. JVD not elevated. Lungs: Clear bilaterally to auscultation without wheezes, rales, or rhonchi. Breathing is unlabored. Heart: RRR S1 S2 without murmurs, rubs, or gallops.  Abdomen: Soft, non-distended. Extremities: No clubbing or cyanosis. No edema.  Distal pedal pulses are 2+ and equal bilaterally. Neuro: Alert and oriented X 3. Moves all extremities spontaneously. No focal deficits.  Intake/Output:  Intake/Output Summary (Last 24 hours) at 11/20/11 1053 Last data filed at 11/19/11 2238  Gross per 24 hour  Intake 1919.5 ml  Output   1325 ml  Net  594.5 ml    Inpatient Medications:  . aspirin  81 mg Oral Daily  . aspirin EC  81 mg Oral Daily  . atorvastatin  80 mg Oral q1800  . omega-3 acid ethyl esters  1 g Oral Daily  . pantoprazole  40 mg Oral Q0600  . DISCONTD: sodium chloride  3 mL Intravenous Q12H   . sodium chloride 150 mL/hr at 11/19/11 1741  . nitroGLYCERIN Stopped (11/19/11 0949)  . nitroGLYCERIN 5 mcg/min (11/19/11 1200)  . DISCONTD: sodium chloride 1 mL/kg/hr (11/19/11 0600)  . DISCONTD: heparin 950 Units/hr (11/19/11 0600)   Labs:  [facility] 11/19/11 0448 11/18/11 1315  NA 136 134*  K 4.4 5.6*  CL 103 99  CO2 27 29  GLUCOSE 94 105*  BUN 35* 34*  CREATININE 1.51* 1.44*  CALCIUM 9.5 11.1*  MG -- 2.0  PHOS -- --    Basename 11/18/11 1315  AST 16  ALT 5  ALKPHOS 56  BILITOT 0.4  PROT 6.6  ALBUMIN 3.4*    Basename 11/19/11 0448 11/18/11 1315 11/18/11 0809  WBC 9.1 10.6* --  NEUTROABS -- 7.2 6.9    HGB 11.8* 12.6* --  HCT 35.1* 35.8* --  MCV 96.7 95.5 --  PLT 169 190 --    Basename 11/18/11 2354 11/18/11 1849 11/18/11 1314 11/18/11 0809  CKTOTAL -- -- -- --  CKMB -- -- -- --  TROPONINI <0.30 <0.30 <0.30 <0.30    Basename 11/18/11 1315  TSH 1.655  T4TOTAL --  T3FREE --  THYROIDAB --    Radiology/Studies: Dg Chest Portable 1 View  11/18/2011  *RADIOLOGY REPORT*  Clinical Data: Chest pain  PORTABLE CHEST - 1 VIEW  Comparison: Acute abdominal series 05/27/2011  Findings: Heart size appears mildly enlarged and stable.  The patient is rotated slightly to the right.  There are probable bilateral nipple shadows (the right nipple shadow projects over the soft tissues lateral to the right rib cage, and the left nipple shadow projects over the left lung.  There are emphysematous changes bilaterally.  Mild peribronchial thickening at the lung bases appears unchanged from prior study.  No airspace disease, effusion, or pneumothorax is identified.  IMPRESSION:  1.  No acute findings. 2.  Emphysema. 3.  Probable bilateral nipple shadows.  This could be confirmed with follow-up chest radiograph with nipple markers in place.   Original Report Authenticated By: Britta Mccreedy, M.D.  Echocardiogram: LVEF 50-55%. Regional wall motion abnormalities: Possible moderate hypokinesis of the basal-midinferior myocardium. No significant valvular abnormalities. LA dimension 40 mm.  Telemetry: sinus bradycardia at 49 bpm currently   Assessment and Plan  1. Symptomatic (minimally) tachybrady syndrome  2. 3 vessel CAD  3. CKD, stage 2 4. HTN  Connor Diaz is a pleasant 78 yo man who up until the past 2 weeks has been healthy. He was found to have sinus node dysfunction with resting HRs in the 30s and underlying RBBB. He underwent catheterization yesterday and was found to have 3 vessel disease with an occluded RCA and severe, heavily calcified disease in the LAD and LCx. He has renal insufficiency and was  on no sinus node slowing drugs. In addition, he has had atrial fibrillation with heart rates over 100 bpm. Per Dr. Lubertha Basque recs yesterday, "In light of the above, I would suggest PPM and uptitration of beta blocker as well as initiation of low dose amiodarone. He will not tolerate atrial fib, and will be ischemic when he goes into this arrythmia."   Connor Diaz and his wife state they understand the indications for PPM in addition to the procedure and its risks and benefits. All of their questions have been answered to their satisfaction and they have no further questions. He agrees to proceed with PPM implantation today.   Signed, EDMISTEN, BROOKE PA-C  I have seen, examined the patient, and reviewed the above assessment and plan.  Changes to above are made where necessary.  Pt with symptomatic tachy/brady syndrome.  I agree with Dr Ladona Ridgel that PPM is reasonable. Risks, benefits, alternatives to pacemaker implantation were discussed in detail with the patient today. The patient understands that the risks include but are not limited to bleeding, infection, pneumothorax, perforation, tamponade, vascular damage, renal failure, MI, stroke, death,  and lead dislodgement and wishes to proceed. We will therefore schedule the procedure at the next available time.   Co Sign: Hillis Range, MD 11/20/2011 11:38 AM

## 2011-11-20 NOTE — Cardiovascular Report (Signed)
NAMEPATRIK, GRUDZINSKI NO.:  [id number]  MEDICAL RECORD NO.:  [id number]  LOCATION:  2922                         FACILITY:  [facility]  PHYSICIAN:  Eduardo Osier. [name], M.D. DATE OF BIRTH:  10/03/1923  DATE OF PROCEDURE:  11/19/2011 DATE OF DISCHARGE:                           CARDIAC CATHETERIZATION   PROCEDURE:  Left cardiac cath with selective left and right coronary angiography, LV graphy via right groin using Judkins technique.  INDICATION FOR THE PROCEDURE:  Mr. Mon is a Nehemiah Settle is 78 year old male with past medical history significant for hypertension, history of abdominal aortic aneurysm, degenerative joint disease, remote tobacco abuse, was transferred from [facility] ER.  The patient went to ER from PMDs office because of retrosternal chest pain off and on for last 2 days described as pressure, heaviness as if someone is sitting on the chest associated with feeling weak, tired, weak, fatigued, and mild shortness of breath.  The patient was noted to be in AFib with moderate ventricular response and was noted to have occasional AFib with slow ventricular response, heart rate in 40s and also episodes of marked sinus bradycardia.  The patient denies any palpitation, lightheadedness, or syncopal episode at present.  The patient received heparin and IV nitrates in the ER and was transferred to [facility].  The patient presently denies any chest pain.  Denies any cardiac workup in the past. Denies any weakness in the arms or legs.  Denies any thyroid problems. Due to typical anginal chest pain, multiple risk factors discussed with the patient and his family regarding left cath, possible PTCA stenting, its risks and benefits, i.e., death, MI, stroke, need for emergency CABG, local vascular complications, etc., and consents for PCI procedure.  After obtaining informed consent, the patient was brought to the cath lab and was placed on fluoroscopy table.  Right groin  was prepped and draped in usual fashion.  Xylocaine 1% was used for local anesthesia in the right groin.  With the help of thin wall needle, a 6- French arterial sheath was placed.  The sheath was aspirated and flushed.  Next, 6-French left Judkins catheter was advanced over the wire under fluoroscopic guidance up to the ascending aorta.  Wire was pulled out.  The catheter was aspirated and connected to the Manifold. Catheter was further advanced and engaged into left coronary ostium. Multiple views of the left system were taken.  Next, the catheter was disengaged and was pulled out over the wire and was replaced with 6- [place] right Judkins catheter, which was advanced over the wire under fluoroscopic guidance up to the ascending aorta.  Wire was pulled out. The catheter was aspirated and connected to the Manifold.  Catheter was further advanced and engaged into right coronary ostium.  A single view of right coronary artery was obtained.  Next, the catheter was disengaged and was pulled out over the wire.  Sheaths were aspirated and flushed.  FINDINGS:  LV was not done as the patient had 2D echo, which showed good LV systolic function.  Also the patient had elevated creatinine of 1.55 with GFR between 40-45.  Left main has mild aneurysmal mid dilatation and 30-40% distal stenosis.  LAD has  60-80% multiple sequential, proximal, and mid stenosis.  Vessel is very calcified.  Diagonal 1 is small, which has 40-50% ostial stenosis.  Diagonal 2 is very small which has 50-60% proximal stenosis.  Left circumflex has 80-85% focal bifurcation stenosis in the proximal portion with OM2.  OM1 is very, very small.  OM2 has 30-40% proximal stenosis.  OM3 has 80-85% proximal focal stenosis.  RCA has 50-60% ostial stenosis and then 100% occluded filling distally by collaterals from the left system.  All his vessels are very calcified.  The patient tolerated the procedure well.  There were  no complications.  Plan is to hydrate the patient.  Discussed with family regarding various options of treatment, i.e., high-risk PCI versus CABG versus medical management.  We will also get EP consultation for possible permanent pacemaker insertion.     Eduardo Osier. [name], M.D.     [facility]/MEDQ  D:  11/19/2011  T:  11/20/2011  Job:  [id number]

## 2011-11-21 ENCOUNTER — Inpatient Hospital Stay (HOSPITAL_COMMUNITY): Payer: Medicare Other

## 2011-11-21 LAB — BASIC METABOLIC PANEL
BUN: 23 mg/dL (ref 6–23)
Calcium: 9 mg/dL (ref 8.4–10.5)
Creatinine, Ser: 1.31 mg/dL (ref 0.50–1.35)
GFR calc Af Amer: 55 mL/min — ABNORMAL LOW (ref >90)
GFR calc non Af Amer: 48 mL/min — ABNORMAL LOW (ref >90)

## 2011-11-21 LAB — CBC
HCT: 33.7 % — ABNORMAL LOW (ref 39.0–52.0)
MCH: 32.7 pg (ref 26.0–34.0)
MCHC: 34.1 g/dL (ref 30.0–36.0)
MCV: 95.7 fL (ref 78.0–100.0)
RDW: 12.7 % (ref 11.5–15.5)

## 2011-11-21 MED ORDER — METOPROLOL TARTRATE 25 MG PO TABS
25.0000 mg | ORAL_TABLET | Freq: Two times a day (BID) | ORAL | Status: DC
  Administered 2011-11-21 – 2011-11-23 (×5): 25 mg via ORAL
  Filled 2011-11-21 (×6): qty 1

## 2011-11-21 MED ORDER — AMIODARONE HCL 200 MG PO TABS
200.0000 mg | ORAL_TABLET | Freq: Two times a day (BID) | ORAL | Status: DC
  Administered 2011-11-21 – 2011-11-23 (×4): 200 mg via ORAL
  Filled 2011-11-21 (×6): qty 1

## 2011-11-21 NOTE — Progress Notes (Signed)
No charge note  Patient seen.  Pacer site looks good.  Xray is pending.  Does have some expiratory wheezing on exam.  Appears to be in some atrial fib on telemetry overriding pacer.  Dr.  Ladona Ridgel had recommended up titration of beta blockade and addition of Amiodarone, but these have not been started.  In light of wheezing, overall adjustment may be difficult and he may require additional time in the hospital.    Will sign off and defer to Dr. Algie Coffer regarding overall management.

## 2011-11-21 NOTE — Plan of Care (Signed)
Problem: Phase I Progression Outcomes Goal: Voiding-avoid urinary catheter unless indicated Outcome: Adequate for Discharge Foley catheter discontinued at 430.

## 2011-11-21 NOTE — Progress Notes (Signed)
Subjective:  Feeling better. Heart rate in 70-90's, SR with APCs. No chest pain.   Objective:  Vital Signs in the last 24 hours: Temp:  [97.6 F (36.4 C)-98.7 F (37.1 C)] 97.8 F (36.6 C) (10/05 0829) Pulse Rate:  [42-93] 93  (10/05 0829) Cardiac Rhythm:  [-] Heart block (10/04 2000) Resp:  [13-21] 18  (10/05 0829) BP: (118-145)/(50-111) 141/76 mmHg (10/05 0829) SpO2:  [95 %-98 %] 98 % (10/05 0829) Weight:  [83.235 kg (183 lb 8 oz)] 83.235 kg (183 lb 8 oz) (10/05 0345)  Physical Exam: BP Readings from Last 1 Encounters:  11/21/11 141/76    Wt Readings from Last 1 Encounters:  11/21/11 83.235 kg (183 lb 8 oz)    Weight change:   HEENT: [place]/AT, Eyes-Blue, PERL, EOMI, Conjunctiva-Pink, Sclera-Non-icteric Neck: No JVD, No bruit, Trachea midline. Lungs:  Clear, Bilateral. Left pectoral pacer site is intact. Cardiac:  Regular rhythm, normal S1 and S2, no S3.  Abdomen:  Soft, non-tender. Extremities:  No edema present. No cyanosis. No clubbing. CNS: AxOx3, Cranial nerves grossly intact, moves all 4 extremities. Right handed. Skin: Warm and dry.   Intake/Output from previous day: 10/04 0701 - 10/05 0700 In: 240 [P.O.:240] Out: 1900 [Urine:1900]    Lab Results: BMET    Component Value Date/Time   NA 134* 11/21/2011 0630   K 4.2 11/21/2011 0630   CL 102 11/21/2011 0630   CO2 25 11/21/2011 0630   GLUCOSE 98 11/21/2011 0630   BUN 23 11/21/2011 0630   CREATININE 1.31 11/21/2011 0630   CALCIUM 9.0 11/21/2011 0630   GFRNONAA 48* 11/21/2011 0630   GFRAA 55* 11/21/2011 0630   CBC    Component Value Date/Time   WBC 9.5 11/21/2011 0630   RBC 3.52* 11/21/2011 0630   HGB 11.5* 11/21/2011 0630   HCT 33.7* 11/21/2011 0630   PLT 161 11/21/2011 0630   MCV 95.7 11/21/2011 0630   MCH 32.7 11/21/2011 0630   MCHC 34.1 11/21/2011 0630   RDW 12.7 11/21/2011 0630   LYMPHSABS 2.1 11/18/2011 1315   MONOABS 0.9 11/18/2011 1315   EOSABS 0.4 11/18/2011 1315   BASOSABS 0.0 11/18/2011 1315   CARDIAC  ENZYMES Lab Results  Component Value Date   TROPONINI <0.30 11/18/2011    Assessment/Plan:  Patient Active Hospital Problem List: Multivessel coronary artery disease status post left cath  Probably tachybradycardia syndrome  Status post symptomatic bradycardia  Hypertension  Abdominal aortic aneurysm  Mild renal insufficiency stage II  Remote tobacco abuse  Degenerative joint disease   Add beta blockers and low dose amiodarone       LOS: 3 days    Orpah Cobb  MD  11/21/2011, 10:57 AM

## 2011-11-22 ENCOUNTER — Inpatient Hospital Stay (HOSPITAL_COMMUNITY): Payer: Medicare Other

## 2011-11-22 MED ORDER — DIGOXIN 0.25 MG/ML IJ SOLN
0.2500 mg | Freq: Once | INTRAMUSCULAR | Status: AC
  Administered 2011-11-22: 0.25 mg via INTRAVENOUS
  Filled 2011-11-22: qty 1

## 2011-11-22 MED ORDER — DIGOXIN 0.25 MG/ML IJ SOLN
0.2500 mg | Freq: Once | INTRAMUSCULAR | Status: AC
  Administered 2011-11-22: 0.25 mg via INTRAVENOUS
  Filled 2011-11-22 (×2): qty 1

## 2011-11-22 NOTE — Progress Notes (Signed)
Subjective:  Feeling well. Heart rate 70-140/min-possible paroxysmal Atrial flutter  Objective:  Vital Signs in the last 24 hours: Temp:  [97.7 F (36.5 C)-98.2 F (36.8 C)] 97.9 F (36.6 C) (10/06 0406) Pulse Rate:  [64-74] 74  (10/06 0406) Cardiac Rhythm:  [-] A-V Sequential paced (10/05 1944) Resp:  [14-18] 14  (10/06 0406) BP: (117-149)/(48-60) 117/60 mmHg (10/06 0406) SpO2:  [97 %-99 %] 97 % (10/06 0406)  Physical Exam: BP Readings from Last 1 Encounters:  11/22/11 117/60    Wt Readings from Last 1 Encounters:  11/21/11 83.235 kg (183 lb 8 oz)    Weight change:   HEENT: /AT, Eyes-Brown, PERL, EOMI, Conjunctiva-Pink, Sclera-Non-icteric Neck: No JVD, No bruit, Trachea midline. Lungs:  Mostly clear, Bilateral. Rare rhonchi. Cardiac:  Regular rhythm, normal S1 and S2, no S3.  Abdomen:  Soft, non-tender. Extremities:  No edema present. No cyanosis. No clubbing. CNS: AxOx3, Cranial nerves grossly intact, moves all 4 extremities. Right handed. Skin: Warm and dry.   Intake/Output from previous day: 10/05 0701 - 10/06 0700 In: 840 [P.O.:840] Out: 1050 [Urine:1050]    Lab Results: BMET    Component Value Date/Time   NA 134* 11/21/2011 0630   K 4.2 11/21/2011 0630   CL 102 11/21/2011 0630   CO2 25 11/21/2011 0630   GLUCOSE 98 11/21/2011 0630   BUN 23 11/21/2011 0630   CREATININE 1.31 11/21/2011 0630   CALCIUM 9.0 11/21/2011 0630   GFRNONAA 48* 11/21/2011 0630   GFRAA 55* 11/21/2011 0630   CBC    Component Value Date/Time   WBC 9.5 11/21/2011 0630   RBC 3.52* 11/21/2011 0630   HGB 11.5* 11/21/2011 0630   HCT 33.7* 11/21/2011 0630   PLT 161 11/21/2011 0630   MCV 95.7 11/21/2011 0630   MCH 32.7 11/21/2011 0630   MCHC 34.1 11/21/2011 0630   RDW 12.7 11/21/2011 0630   LYMPHSABS 2.1 11/18/2011 1315   MONOABS 0.9 11/18/2011 1315   EOSABS 0.4 11/18/2011 1315   BASOSABS 0.0 11/18/2011 1315   CARDIAC ENZYMES Lab Results  Component Value Date   TROPONINI <0.30 11/18/2011     Assessment/Plan:  Patient Active Hospital Problem List: Multivessel coronary artery disease status post left cath  Probably tachybradycardia syndrome  Status post symptomatic bradycardia  Hypertension  Abdominal aortic aneurysm  Mild renal insufficiency stage II  Remote tobacco abuse  Degenerative joint disease   Low dose beta blockers and amiodarone  Add lanoxin for HR control till amiodarone starts working. Check iron level.   LOS: 4 days    Orpah Cobb  MD  11/22/2011, 10:19 AM

## 2011-11-23 ENCOUNTER — Encounter: Payer: Self-pay | Admitting: *Deleted

## 2011-11-23 DIAGNOSIS — Z950 Presence of cardiac pacemaker: Secondary | ICD-10-CM | POA: Insufficient documentation

## 2011-11-23 DIAGNOSIS — R079 Chest pain, unspecified: Secondary | ICD-10-CM

## 2011-11-23 MED ORDER — NITROGLYCERIN 0.4 MG SL SUBL: 0.4000 mg | SUBLINGUAL_TABLET | SUBLINGUAL | Status: AC | PRN

## 2011-11-23 MED ORDER — PANTOPRAZOLE SODIUM 40 MG PO TBEC: 40.0000 mg | DELAYED_RELEASE_TABLET | Freq: Every day | ORAL | Status: DC

## 2011-11-23 MED ORDER — METOPROLOL TARTRATE 25 MG PO TABS: 25.0000 mg | ORAL_TABLET | Freq: Two times a day (BID) | ORAL | Status: DC

## 2011-11-23 MED ORDER — AMIODARONE HCL 200 MG PO TABS: 200.0000 mg | ORAL_TABLET | Freq: Two times a day (BID) | ORAL | Status: DC

## 2011-11-23 MED ORDER — ASPIRIN 81 MG PO TBEC: 81.0000 mg | DELAYED_RELEASE_TABLET | Freq: Every day | ORAL | Status: DC

## 2011-11-23 MED ORDER — ATORVASTATIN CALCIUM 80 MG PO TABS: 40.0000 mg | ORAL_TABLET | Freq: Every day | ORAL | Status: DC

## 2011-11-23 NOTE — Discharge Summary (Signed)
  Discharge summary dictated on 10 713 dictation number is [phone]

## 2011-11-23 NOTE — Progress Notes (Signed)
Pt and family inquiring about d/c home today; MD paged at this time; will await callback.

## 2011-11-23 NOTE — Progress Notes (Signed)
   SUBJECTIVE: The patient is doing well today.  At this time, he denies chest pain, shortness of breath, or any new concerns.     Marland Kitchen amiodarone  200 mg Oral BID  . aspirin EC  81 mg Oral Daily  . atorvastatin  80 mg Oral q1800  . digoxin  0.25 mg Intravenous Once  . digoxin  0.25 mg Intravenous Once  . metoprolol tartrate  25 mg Oral BID  . omega-3 acid ethyl esters  1 g Oral Daily  . pantoprazole  40 mg Oral Q0600  . sodium chloride  3 mL Intravenous Q12H      OBJECTIVE: Physical Exam: Filed Vitals:   11/22/11 2024 11/22/11 2027 11/22/11 2210 11/23/11 0416  BP: 161/76 152/72 154/74 130/69  Pulse: 67  68 67  Temp: 98.5 F (36.9 C)   97.8 F (36.6 C)  TempSrc: Oral   Oral  Resp: 17   18  Height:      Weight:    175 lb 11.2 oz (79.697 kg)  SpO2: 96%   98%    Intake/Output Summary (Last 24 hours) at 11/23/11 4098 Last data filed at 11/22/11 1800  Gross per 24 hour  Intake    240 ml  Output    601 ml  Net   -361 ml    Telemetry reveals atrial pacing, short nonsustained atrial arrhythmias  GEN- The patient is well appearing, alert and oriented x 3 today.   Head- normocephalic, atraumatic Eyes-  Sclera clear, conjunctiva pink Ears- hearing intact Oropharynx- clear Chest- pacemaker site without hematoma Lungs- decreased BS throughout, no wheezes Heart- Regular rate and rhythm, no murmurs, rubs or gallops, PMI not laterally displaced GI- soft, NT, ND, + BS Extremities- no clubbing, cyanosis, or edema   LABS: Basic Metabolic Panel:  Basename 11/21/11 0630  NA 134*  K 4.2  CL 102  CO2 25  GLUCOSE 98  BUN 23  CREATININE 1.31  CALCIUM 9.0  MG --  PHOS --   Liver Function Tests: No results found for this basename: AST:2,ALT:2,ALKPHOS:2,BILITOT:2,PROT:2,ALBUMIN:2 in the last 72 hours No results found for this basename: LIPASE:2,AMYLASE:2 in the last 72 hours CBC:  Basename 11/21/11 0630  WBC 9.5  NEUTROABS --  HGB 11.5*  HCT 33.7*  MCV 95.7  PLT 161   Anemia Panel:  Basename 11/22/11 1038  VITAMINB12 --  FOLATE --  FERRITIN 396*  TIBC 261  IRON 79  RETICCTPCT --    ASSESSMENT AND PLAN:  1. Tachy/ brady- normal pacemaker function Return to my office for a wound check in 1 week Routine wound care  2. afib- appears to be mostly in sinus Low dose amiodarone and beta blocker therapy are probably reasonable I would avoid digoxin given recent data suggesting increased mortality with digoxin when treating afib as well as this patient's advanced age  OK to discharge from an EP standpoint We will see as needed while here. Please call with questions.  Hillis Range, MD 11/23/2011 8:21 AM

## 2011-11-24 NOTE — Discharge Summary (Signed)
NAMELYELL, CLUGSTON NO.:  [id number]  MEDICAL RECORD NO.:  [id number]  LOCATION:  2014                         FACILITY:  [facility]  PHYSICIAN:  Eduardo Osier. [name], M.D. DATE OF BIRTH:  [birth year]  DATE OF ADMISSION:  11/18/2011 DATE OF DISCHARGE:  11/23/2011                              DISCHARGE SUMMARY   ADMITTING DIAGNOSES: 1. Acute coronary syndrome. 2. New onset atrial fibrillation. 3. Probable tachycardia-bradycardia syndrome. 4. Hypertension. 5. Abdominal aortic aneurysm. 6. Mild renal insufficiency, stage II. 7. Remote tobacco abuse. 8. Degenerative joint disease.  DISCHARGE DIAGNOSES: 1. Unstable angina, multivessel coronary artery disease, status post     left catheterization. 2. Tachycardia-bradycardia syndrome, status post permanent pacemaker. 3. Status post atrial fibrillation. 4. Hypertension. 5. Hypercholesteremia. 6. Abdominal aortic aneurysm. 7. Mild renal insufficiency, stage II. 8. Remote tobacco abuse. 9. Degenerative joint disease.  DISCHARGE HOME MEDICATIONS: 1. Amiodarone 200 mg 1 tablet twice daily for now. 2. Enteric-coated aspirin 81 mg 1 tablet daily. 3. Atorvastatin 40 mg daily. 4. Metoprolol tartrate 25 mg 1 tablet twice daily. 5. Nitrostat 0.4 mg sublingual use as directed. 6. Protonix 40 mg 1 tablet daily. 7. Avodart 0.5 mg daily. 8. Omega-3 fish oil 1 g daily.  The patient has been advised to stop     Benicar and Naprosyn.  DIET:  Low salt, low cholesterol.  ACTIVITY:  Increase activity slowly as tolerated.  Follow up with me in 1 week.  Follow up with Dr. Johney Frame as scheduled.  CONDITION AT DISCHARGE:  Stable.  BRIEF HISTORY AND HOSPITAL COURSE:  Mr. Retherford is 78 year old male with past medical history significant for hypertension, abdominal aortic aneurysm, degenerative joint disease, remote tobacco abuse, who was transferred from [facility] ER.  The patient went to ER from PMD's office because of  retrosternal chest pain off and on for last 2 days, described as pressure, heaviness as if someone is sitting on his chest associated with feeling tired, weak, fatigued and mild shortness of breath.  The patient was noted to be in AFib with moderate ventricular response and was noted to have occasional AFib with slow ventricular response, heart rate in the 40s.  The patient denies any palpitation, lightheadedness, or syncopal episode.  The patient received IV heparin, IV nitrates in the ER with relief of chest pain and was transferred to [facility] for further management.  The patient denies any chest pain at present.  Denies any cardiac workup in the past. Denies any weakness in the arms or legs.  Denies any claudication pain. Denies any thyroid problems.  PAST MEDICAL HISTORY:  As above.  PAST SURGICAL HISTORY:  Had tonsillectomy in the past.  He is married. He used to smoke many years ago.  No history of alcohol abuse.  PHYSICAL EXAMINATION:  VITAL SIGNS:  His blood pressure was 125/53, pulse was 82, irregularly irregular.  He was afebrile. HEENT:  Conjunctiva was pink. NECK:  Supple.  No JVD. LUNGS:  Clear to auscultation without rhonchi or rales. CARDIOVASCULAR:  Irregularly irregular.  S1, S2 was soft.  There was soft systolic murmur.  No S3 gallop. ABDOMEN:  Soft.  Bowel sounds were present.  Nontender. EXTREMITIES:  There was no clubbing, cyanosis, or edema.  LABORATORY DATA:  Sodium was 134, potassium 4.2, BUN 36, creatinine 1.40.  Repeat electrolytes on November 21, 2011; sodium 134, potassium 4.2, BUN 23, creatinine 1.31, which has been stable.  His glucose was 111, repeat fasting blood sugar was 94 and 98.  Four sets of troponin-I were negative.  Cholesterol done on November 19, 2011; total cholesterol 124, LDL 51, HDL 46, triglycerides were 87.  Hematocrit was 41.1, white count of 12.4.  Hemoglobin on November 21, 2011 was 11.5, hematocrit 33.7, white count of 9.5, which has  been stable for last 2 days.  His TSH was 1.655.  BRIEF HOSPITAL COURSE:  The patient was admitted to Step-Down Unit.  MI was ruled out by serial enzymes and EKG.  The patient subsequently underwent left cardiac cath with selective left and right coronary angiography as per procedure report and was noted to have multivessel CAD.  EP consultation was also called with Dr. Ladona Ridgel.  The patient subsequently underwent permanent pacemaker placement.  His pacemaker site is okay.  The patient had occasional episodes of AFib with RVR, was started on p.o. Lopressor and low-dose amiodarone.  Discussed with the patient and family at length various options of treatment, i.e. medical versus staged PCI as outpatient, which the patient agrees.  The patient will be discharged home on above medications and will start him on Plavix as outpatient once the pacemaker pocket is healing well, and if the patient is able to tolerate Plavix, we will schedule him for PCI as outpatient.     Eduardo Osier. [name], M.D.     MNH/MEDQ  D:  11/23/2011  T:  11/23/2011  Job:  [id number]

## 2011-11-30 ENCOUNTER — Ambulatory Visit (INDEPENDENT_AMBULATORY_CARE_PROVIDER_SITE_OTHER): Payer: Medicare Other | Admitting: *Deleted

## 2011-11-30 DIAGNOSIS — I442 Atrioventricular block, complete: Secondary | ICD-10-CM

## 2011-11-30 LAB — PACEMAKER DEVICE OBSERVATION
AL IMPEDENCE PM: 512.5 Ohm
AL THRESHOLD: 0.625 V
ATRIAL PACING PM: 89
BAMS-0001: 150 {beats}/min
BAMS-0003: 70 {beats}/min
DEVICE MODEL PM: 7394618
RV LEAD IMPEDENCE PM: 675 Ohm
VENTRICULAR PACING PM: 85

## 2011-11-30 NOTE — Progress Notes (Signed)
Wound check-PPM 

## 2011-12-15 ENCOUNTER — Encounter: Payer: Self-pay | Admitting: Internal Medicine

## 2011-12-17 ENCOUNTER — Encounter (HOSPITAL_COMMUNITY): Payer: Self-pay | Admitting: Pharmacist

## 2011-12-22 ENCOUNTER — Encounter (HOSPITAL_COMMUNITY): Payer: Self-pay | Admitting: General Practice

## 2011-12-22 ENCOUNTER — Encounter (HOSPITAL_COMMUNITY)
Admission: RE | Disposition: A | Discharge status: Home / Self Care | Payer: Self-pay | Source: Ambulatory Visit | Attending: Cardiology

## 2011-12-22 ENCOUNTER — Ambulatory Visit (HOSPITAL_COMMUNITY)
Admission: RE | Admit: 2011-12-22 | Discharge: 2011-12-23 | Disposition: A | Discharge status: Home / Self Care | Payer: Medicare Other | Source: Ambulatory Visit | Attending: Cardiology | Admitting: Cardiology

## 2011-12-22 DIAGNOSIS — M1990 Unspecified osteoarthritis, unspecified site: Secondary | ICD-10-CM | POA: Insufficient documentation

## 2011-12-22 DIAGNOSIS — E7800 Pure hypercholesterolemia, unspecified: Secondary | ICD-10-CM | POA: Insufficient documentation

## 2011-12-22 DIAGNOSIS — Z950 Presence of cardiac pacemaker: Secondary | ICD-10-CM | POA: Insufficient documentation

## 2011-12-22 DIAGNOSIS — I714 Abdominal aortic aneurysm, without rupture, unspecified: Secondary | ICD-10-CM | POA: Insufficient documentation

## 2011-12-22 DIAGNOSIS — N182 Chronic kidney disease, stage 2 (mild): Secondary | ICD-10-CM | POA: Insufficient documentation

## 2011-12-22 DIAGNOSIS — I4891 Unspecified atrial fibrillation: Secondary | ICD-10-CM | POA: Insufficient documentation

## 2011-12-22 DIAGNOSIS — I129 Hypertensive chronic kidney disease with stage 1 through stage 4 chronic kidney disease, or unspecified chronic kidney disease: Secondary | ICD-10-CM | POA: Insufficient documentation

## 2011-12-22 DIAGNOSIS — I2510 Atherosclerotic heart disease of native coronary artery without angina pectoris: Secondary | ICD-10-CM | POA: Insufficient documentation

## 2011-12-22 HISTORY — DX: Atherosclerotic heart disease of native coronary artery without angina pectoris: I25.10

## 2011-12-22 HISTORY — PX: PERCUTANEOUS CORONARY STENT INTERVENTION (PCI-S): SHX5485

## 2011-12-22 HISTORY — PX: CORONARY ANGIOPLASTY WITH STENT PLACEMENT: SHX49

## 2011-12-22 HISTORY — DX: Hypothyroidism, unspecified: E03.9

## 2011-12-22 LAB — BASIC METABOLIC PANEL
BUN: 24 mg/dL — ABNORMAL HIGH (ref 6–23)
CO2: 29 mEq/L (ref 19–32)
Calcium: 9.2 mg/dL (ref 8.4–10.5)
Creatinine, Ser: 1.47 mg/dL — ABNORMAL HIGH (ref 0.50–1.35)
Glucose, Bld: 98 mg/dL (ref 70–99)

## 2011-12-22 LAB — CBC
MCH: 33.8 pg (ref 26.0–34.0)
MCV: 96.8 fL (ref 78.0–100.0)
Platelets: 175 10*3/uL (ref 150–400)
RBC: 3.73 MIL/uL — ABNORMAL LOW (ref 4.22–5.81)

## 2011-12-22 LAB — PLATELET INHIBITION P2Y12: Platelet Function  P2Y12: 267 [PRU] (ref 194–418)

## 2011-12-22 LAB — POCT ACTIVATED CLOTTING TIME: Activated Clotting Time: 349 seconds

## 2011-12-22 SURGERY — PERCUTANEOUS CORONARY STENT INTERVENTION (PCI-S)
Anesthesia: LOCAL

## 2011-12-22 MED ORDER — AMIODARONE HCL 200 MG PO TABS
200.0000 mg | ORAL_TABLET | Freq: Every day | ORAL | Status: DC
Start: 2011-12-22 — End: 2011-12-23
  Administered 2011-12-22 – 2011-12-23 (×2): 200 mg via ORAL
  Filled 2011-12-22 (×2): qty 1

## 2011-12-22 MED ORDER — ASPIRIN 81 MG PO CHEW: 81.0000 mg | CHEWABLE_TABLET | Freq: Every day | ORAL | Status: DC

## 2011-12-22 MED ORDER — LIDOCAINE HCL (PF) 1 % IJ SOLN
INTRAMUSCULAR | Status: AC
  Filled 2011-12-22: qty 30

## 2011-12-22 MED ORDER — MAGNESIUM HYDROXIDE 400 MG/5ML PO SUSP: 30.0000 mL | Freq: Every evening | ORAL | Status: DC | PRN

## 2011-12-22 MED ORDER — NITROGLYCERIN 0.2 MG/ML ON CALL CATH LAB
INTRAVENOUS | Status: AC
  Filled 2011-12-22: qty 1

## 2011-12-22 MED ORDER — SODIUM BICARBONATE 8.4 % IV SOLN
INTRAVENOUS | Status: AC
  Filled 2011-12-22: qty 1000

## 2011-12-22 MED ORDER — CLOPIDOGREL BISULFATE 75 MG PO TABS
75.0000 mg | ORAL_TABLET | Freq: Every day | ORAL | Status: DC
  Administered 2011-12-23: 75 mg via ORAL
  Filled 2011-12-22: qty 1

## 2011-12-22 MED ORDER — CLOPIDOGREL BISULFATE 75 MG PO TABS: 75.0000 mg | ORAL_TABLET | Freq: Every day | ORAL | Status: DC

## 2011-12-22 MED ORDER — ACETAMINOPHEN 325 MG PO TABS: 650.0000 mg | ORAL_TABLET | ORAL | Status: DC | PRN

## 2011-12-22 MED ORDER — SODIUM CHLORIDE 0.9 % IJ SOLN: 3.0000 mL | Freq: Two times a day (BID) | INTRAMUSCULAR | Status: DC

## 2011-12-22 MED ORDER — DIAZEPAM 5 MG PO TABS
5.0000 mg | ORAL_TABLET | ORAL | Status: AC
  Administered 2011-12-22: 5 mg via ORAL

## 2011-12-22 MED ORDER — SODIUM CHLORIDE 0.9 % IJ SOLN: 3.0000 mL | INTRAMUSCULAR | Status: DC | PRN

## 2011-12-22 MED ORDER — CLOPIDOGREL BISULFATE 300 MG PO TABS
ORAL_TABLET | ORAL | Status: AC
  Filled 2011-12-22: qty 1

## 2011-12-22 MED ORDER — ATORVASTATIN CALCIUM 40 MG PO TABS
40.0000 mg | ORAL_TABLET | Freq: Every day | ORAL | Status: DC
  Administered 2011-12-22: 40 mg via ORAL
  Filled 2011-12-22 (×2): qty 1

## 2011-12-22 MED ORDER — FAMOTIDINE IN NACL 20-0.9 MG/50ML-% IV SOLN
20.0000 mg | Freq: Once | INTRAVENOUS | Status: AC
  Administered 2011-12-22: 20 mg via INTRAVENOUS

## 2011-12-22 MED ORDER — SODIUM CHLORIDE 0.9 % IV SOLN
0.2500 mg/kg/h | INTRAVENOUS | Status: DC
  Filled 2011-12-22: qty 250

## 2011-12-22 MED ORDER — ASPIRIN 81 MG PO CHEW
324.0000 mg | CHEWABLE_TABLET | ORAL | Status: AC
  Administered 2011-12-22: 324 mg via ORAL

## 2011-12-22 MED ORDER — SODIUM CHLORIDE 0.9 % IV SOLN
INTRAVENOUS | Status: DC
  Administered 2011-12-22: 13:00:00 via INTRAVENOUS

## 2011-12-22 MED ORDER — DUTASTERIDE 0.5 MG PO CAPS
0.5000 mg | ORAL_CAPSULE | Freq: Every morning | ORAL | Status: DC
  Administered 2011-12-22 – 2011-12-23 (×2): 0.5 mg via ORAL
  Filled 2011-12-22 (×2): qty 1

## 2011-12-22 MED ORDER — NITROGLYCERIN IN D5W 200-5 MCG/ML-% IV SOLN
INTRAVENOUS | Status: AC
  Filled 2011-12-22: qty 250

## 2011-12-22 MED ORDER — CLOPIDOGREL BISULFATE 75 MG PO TABS
300.0000 mg | ORAL_TABLET | ORAL | Status: AC
  Administered 2011-12-22: 300 mg via ORAL

## 2011-12-22 MED ORDER — DIAZEPAM 5 MG PO TABS
ORAL_TABLET | ORAL | Status: AC
  Filled 2011-12-22: qty 1

## 2011-12-22 MED ORDER — ONDANSETRON HCL 4 MG/2ML IJ SOLN
INTRAMUSCULAR | Status: AC
  Filled 2011-12-22: qty 2

## 2011-12-22 MED ORDER — SODIUM BICARBONATE 8.4 % IV SOLN
INTRAVENOUS | Status: AC
  Administered 2011-12-22: 07:00:00 via INTRAVENOUS
  Filled 2011-12-22: qty 1000

## 2011-12-22 MED ORDER — ONDANSETRON HCL 4 MG/2ML IJ SOLN: 4.0000 mg | Freq: Four times a day (QID) | INTRAMUSCULAR | Status: DC | PRN

## 2011-12-22 MED ORDER — FENTANYL CITRATE 0.05 MG/ML IJ SOLN
INTRAMUSCULAR | Status: AC
  Filled 2011-12-22: qty 2

## 2011-12-22 MED ORDER — FAMOTIDINE 20 MG PO TABS
20.0000 mg | ORAL_TABLET | Freq: Two times a day (BID) | ORAL | Status: DC
  Administered 2011-12-22 – 2011-12-23 (×3): 20 mg via ORAL
  Filled 2011-12-22 (×4): qty 1

## 2011-12-22 MED ORDER — ASPIRIN 81 MG PO CHEW
CHEWABLE_TABLET | ORAL | Status: AC
  Filled 2011-12-22: qty 4

## 2011-12-22 MED ORDER — EPTIFIBATIDE 75 MG/100ML IV SOLN
INTRAVENOUS | Status: AC
  Filled 2011-12-22: qty 100

## 2011-12-22 MED ORDER — MIDAZOLAM HCL 2 MG/2ML IJ SOLN
INTRAMUSCULAR | Status: AC
  Filled 2011-12-22: qty 2

## 2011-12-22 MED ORDER — ASPIRIN EC 81 MG PO TBEC
81.0000 mg | DELAYED_RELEASE_TABLET | Freq: Every day | ORAL | Status: DC
  Administered 2011-12-22 – 2011-12-23 (×2): 81 mg via ORAL
  Filled 2011-12-22 (×2): qty 1

## 2011-12-22 MED ORDER — HEPARIN (PORCINE) IN NACL 2-0.9 UNIT/ML-% IJ SOLN
INTRAMUSCULAR | Status: AC
  Filled 2011-12-22: qty 1500

## 2011-12-22 MED ORDER — MORPHINE SULFATE 10 MG/ML IJ SOLN
INTRAMUSCULAR | Status: AC
  Filled 2011-12-22: qty 1

## 2011-12-22 MED ORDER — SODIUM CHLORIDE 0.9 % IV SOLN
INTRAVENOUS | Status: DC
  Administered 2011-12-22: 1000 mL via INTRAVENOUS

## 2011-12-22 MED ORDER — NITROGLYCERIN 0.4 MG SL SUBL: 0.4000 mg | SUBLINGUAL_TABLET | SUBLINGUAL | Status: DC | PRN

## 2011-12-22 MED ORDER — METOPROLOL TARTRATE 25 MG PO TABS
25.0000 mg | ORAL_TABLET | Freq: Two times a day (BID) | ORAL | Status: DC
  Administered 2011-12-22 – 2011-12-23 (×3): 25 mg via ORAL
  Filled 2011-12-22 (×4): qty 1

## 2011-12-22 MED ORDER — NITROGLYCERIN IN D5W 200-5 MCG/ML-% IV SOLN
5.0000 ug/min | INTRAVENOUS | Status: DC
  Administered 2011-12-22: 13:00:00 5 ug/min via INTRAVENOUS

## 2011-12-22 MED ORDER — BIVALIRUDIN BOLUS VIA INFUSION
0.1000 mg/kg | Freq: Once | INTRAVENOUS | Status: DC
  Filled 2011-12-22: qty 9

## 2011-12-22 MED ORDER — EPTIFIBATIDE 75 MG/100ML IV SOLN
1.0000 ug/kg/min | INTRAVENOUS | Status: AC
  Administered 2011-12-22: 1 ug/kg/min via INTRAVENOUS
  Filled 2011-12-22 (×2): qty 100

## 2011-12-22 MED ORDER — BIVALIRUDIN 250 MG IV SOLR
INTRAVENOUS | Status: AC
  Filled 2011-12-22: qty 250

## 2011-12-22 MED ORDER — SODIUM CHLORIDE 0.9 % IV SOLN: 250.0000 mL | INTRAVENOUS | Status: DC | PRN

## 2011-12-22 MED ORDER — FAMOTIDINE IN NACL 20-0.9 MG/50ML-% IV SOLN
INTRAVENOUS | Status: AC
  Filled 2011-12-22: qty 50

## 2011-12-22 NOTE — CV Procedure (Signed)
PTCA stenting report dictated on 12/22/2011 dictation number is [id number]

## 2011-12-22 NOTE — Progress Notes (Signed)
Site area: right groin  Site Prior to Removal:  Level 0  Pressure Applied For 20 MINUTES    Minutes Beginning at 1850  Manual:   yes  Patient Status During Pull: AAO x 3  Post Pull Groin Site:  Level 0  Post Pull Instructions Given:  yes  Post Pull Pulses Present:  yes  Dressing Applied:  no  Comments:  Tolerated procedure well

## 2011-12-22 NOTE — Progress Notes (Signed)
Subjective:  Doing well denies any chest pain or shortness of breath  Objective:  Vital Signs in the last 24 hours: Temp:  [96.8 F (36 C)-97.6 F (36.4 C)] 97.6 F (36.4 C) (11/05 1244) Pulse Rate:  [59-65] 65  (11/05 1700) Resp:  [13-21] 17  (11/05 1700) BP: (130-173)/(63-80) 131/67 mmHg (11/05 1700) SpO2:  [98 %-100 %] 100 % (11/05 1700) Weight:  [81.3 kg (179 lb 3.7 oz)-81.647 kg (180 lb)] 81.647 kg (180 lb) (11/05 0610)  Intake/Output from previous day:   Intake/Output from this shift:    Physical Exam: Neck: no adenopathy, no carotid bruit, no JVD and supple, symmetrical, trachea midline Lungs: clear to auscultation bilaterally Heart: regular rate and rhythm, S1, S2 normal, no murmur, click, rub or gallop Abdomen: soft, non-tender; bowel sounds normal; no masses,  no organomegaly Extremities: extremities normal, atraumatic, no cyanosis or edema and Right groin sheath site okay no hematoma  Lab Results:  Basename 12/22/11 0718  WBC 8.9  HGB 12.6*  PLT 175    Basename 12/22/11 0716  NA 135  K 4.2  CL 99  CO2 29  GLUCOSE 98  BUN 24*  CREATININE 1.47*   No results found for this basename: TROPONINI:2,CK,MB:2 in the last 72 hours Hepatic Function Panel No results found for this basename: PROT,ALBUMIN,AST,ALT,ALKPHOS,BILITOT,BILIDIR,IBILI in the last 72 hours No results found for this basename: CHOL in the last 72 hours No results found for this basename: PROTIME in the last 72 hours  Imaging: Imaging results have been reviewed and No results found.  Cardiac Studies:  Assessment/Plan:  Accelerated angina status post multivessel PCI as above Hypertension Tachybradycardia syndrome status post permanent pacemaker Hypercholesteremia Abdominal aortic aneurysm Degenerative joint disease Status post A. fib Mild renal insufficiency stage II Remote tobacco abuse Plan DC sheets once ACT below 150 Continue present management  LOS: 0 days    Endy Easterly  N 12/22/2011, 6:09 PM

## 2011-12-22 NOTE — H&P (Signed)
  Handwritten H&P in the chart needs to be scanned. 

## 2011-12-22 NOTE — Progress Notes (Signed)
ANTICOAGULATION CONSULT NOTE - Initial Consult  Pharmacy Consult for Integrillin Indication: Continuation for ~12 hours post-PCI  No Known Allergies  Patient Measurements: Height: 5\' 11"  (180.3 cm) Weight: 180 lb (81.647 kg) IBW/kg (Calculated) : 75.3   Vital Signs: Temp: 97.6 F (36.4 C) (11/05 1244) Temp src: Oral (11/05 1244) BP: 150/72 mmHg (11/05 1244) Pulse Rate: 60  (11/05 1244)  Labs:  Basename 12/22/11 0718 12/22/11 0716  HGB 12.6* --  HCT 36.1* --  PLT 175 --  APTT -- --  LABPROT 13.9 --  INR 1.08 --  HEPARINUNFRC -- --  CREATININE -- 1.47*  CKTOTAL -- --  CKMB -- --  TROPONINI -- --    Estimated Creatinine Clearance: 38.4 ml/min (by C-G formula based on Cr of 1.47).   Medical History: Past Medical History  Diagnosis Date  . Hypertension   . Arthritis   . AAA (abdominal aortic aneurysm)     a. 02/2009 U/S 4.4 x 4.5 cm  . PAF (paroxysmal atrial fibrillation)     a. noted 11/2011  . Symptomatic bradycardia     a. noted 11/2011  . CKD (chronic kidney disease), stage III   . DJD (degenerative joint disease)   . History of tobacco abuse     a. roughly 36 pack years, quit @ age 75.  Marland Kitchen Pancreatitis     a. 10/2011 - managed conservatively @ home.  Marland Kitchen BPH (benign prostatic hyperplasia)     Assessment: 78 y.o. M to resume Integrilin post cath with PCI for ~12 hours. The patient was bolused with Integrilin and started at a rate of 2 mcg/kg/min in the cath lab around 1020. Given the patient's renal function (SCr 1.47, CrCl~30-40 ml/min) -- will reduce the Integrilin infusion to 1 mcg/kg/min to complete the rest of the 12 hour interval post-cath.  Goal of Therapy:  ACT~200-300 seconds, aPTT 50-70 seconds   Plan:  1. Reduce integrilin to 1 mcg/kg/min (~6.5 ml/hr)  2. Discontinue drip at ~2230 (~12 hours post starting in cath lab) 3. Pharmacy will sign off as no further intervention needed at this time.   Georgina Pillion, PharmD, BCPS Clinical  Pharmacist Pager: [phone] 12/22/2011 1:20 PM

## 2011-12-23 LAB — BASIC METABOLIC PANEL
Calcium: 8.7 mg/dL (ref 8.4–10.5)
GFR calc Af Amer: 55 mL/min — ABNORMAL LOW (ref >90)
GFR calc non Af Amer: 48 mL/min — ABNORMAL LOW (ref >90)
Potassium: 4.4 mEq/L (ref 3.5–5.1)
Sodium: 135 mEq/L (ref 135–145)

## 2011-12-23 LAB — POCT ACTIVATED CLOTTING TIME
Activated Clotting Time: 199 seconds
Activated Clotting Time: 174 seconds

## 2011-12-23 LAB — CBC
Hemoglobin: 10.9 g/dL — ABNORMAL LOW (ref 13.0–17.0)
Platelets: 159 10*3/uL (ref 150–400)
RBC: 3.28 MIL/uL — ABNORMAL LOW (ref 4.22–5.81)
WBC: 9.6 10*3/uL (ref 4.0–10.5)

## 2011-12-23 MED ORDER — FAMOTIDINE 20 MG PO TABS: 20.0000 mg | ORAL_TABLET | Freq: Two times a day (BID) | ORAL | Status: DC

## 2011-12-23 MED FILL — Dextrose Inj 5%: INTRAVENOUS | Qty: 50 | Status: AC

## 2011-12-23 NOTE — Progress Notes (Addendum)
CARDIAC REHAB PHASE I   PRE:  Rate/Rhythm: 67 pacing  BP:  Supine: 147/84  Sitting:   Standing:    SaO2: 98 RA  MODE:  Ambulation: 100 ft   POST:  Rate/Rhythem: 82  BP:  Supine:   Sitting: 157/74  Standing:    SaO2: 90 RA with rest  Sat 95 RA 0905-1015 Assisted X 1 and used walker to Ambulate. Gait unsteady without walker, but much better with walker. Pt states that he does not use a cane or walker to ambulate,but he has not been walking much for the last 8 weeks. Pt able to walk 100 feet, c/o of SOB and weakness for need to go back to room.He is very deconditioned.Would recommend [facility].He was not interested. Wife would like to have this service. Pt states that he has his wife to help him, she states that she can not do what he needs.Family state that they have a walker he can use at home. RA sat after walk 90% with rest and time  Improved to 95%. Completed discharge education with pt and wife, also other family members present. Pt declines Outpt CRP due to bad knees. He is A member at [employer] and was swimming a lot of days there. Dr Miguel Rota told him he could get in pool 8 weeks after pacemaker. Family voices understanding of education provided.   Beatrix Fetters

## 2011-12-23 NOTE — Cardiovascular Report (Signed)
NAMEKAMERON, BLETHEN             ACCOUNT NO.:  [id number]  MEDICAL RECORD NO.:  [id number]  LOCATION:  6523                         FACILITY:  [facility]  PHYSICIAN:  Eduardo Osier. [name], M.D. DATE OF BIRTH:  11-Sep-1923  DATE OF PROCEDURE:  12/22/2011 DATE OF DISCHARGE:                           CARDIAC CATHETERIZATION   PROCEDURES: 1. Successful PTCA to OM-3 using 2.5 x 8-mm long Trek balloon for     predilatation. 2. Successful deployment of 2.5 x 18-mm long Xience Xpedition drug-     eluting stent in mid OM-3. 3. Successful PTCA to proximal left circumflex using initially 2.5 x 8-     mm long Trek balloon and then 2.75 x 12-mm long Trek balloon. 4. Successful deployment of 3.0 x 15-mm long Xience Xpedition drug-     eluting stent in proximal left circumflex. 5. Successful postdilatation of this stent using 3.25 x 12-mm long [place]     Trek balloon. 6. Successful PTCA to mid and proximal LAD using 2.75 x 20-mm long [place]     Trek balloon and then using 3.0 x 20-mm long [place] Trek balloon. 7. Successful deployment of 3.0 x long Xience Xpedition drug-eluting     stent in mid and proximal LAD. 8. Successful postdilatation of this stent using 3.25 x 20-mm long [place]     Trek balloon and then 3.25 x 9-mm long [place] Sprinter balloon going up     to 21 atmospheric pressure.  INDICATION FOR THE PROCEDURE:  Mr. Titsworth is an 78 year old white male with past medical history significant for multivessel coronary artery disease, status post atrial fibrillation with slow ventricular response/tachybrady syndrome, status post symptomatic bradycardia, requiring permanent pacemaker recently, hypertension, hypercholesteremia, abdominal aortic aneurysm, chronic kidney disease stage II, remote tobacco abuse, degenerative joint disease, was recently admitted with retrosternal chest pain, described as heaviness associated with shortness of breath and feeling weak and tired.  The patient was noted to have atrial  fibrillation with moderate ventricular response and then slow ventricular response and spontaneous symptomatic bradycardia. Subsequently had left cath and was noted to have multivessel coronary artery disease.  The patient underwent subsequently permanent pacemaker and is now admitted for elective PCI.  The complains exertional dyspnea with minimal exertion, feeling weak, tired, no energy.  His activity is limited.  Discussed with the patient various options of treatment, i.e. medical versus CABG versus high-risk PCI, its risks and benefits, i.e., death, MI, stroke, need for emergency CABG, local vascular complications, etc., and consented for PCI.  PROCEDURE:  After obtaining the informed consent, the patient was brought to the Cath Lab and was placed on fluoroscopy table.  Right groin was prepped and draped in usual fashion.  Xylocaine 1% was used for local anesthesia.  With the help of thin wall needle, 6-French arterial sheath was placed.  The sheath was aspirated and flushed. Next, 6-French 3.5 VL4 [place] guiding catheter was advanced over the wire under fluoroscopic guidance up to the ascending aorta.  Wire was pulled out, the catheter was aspirated and connected to the Manifold. Catheter was further advanced and engaged into left coronary ostium. Multiple views of the left system were taken.  FINDINGS:  Left main has mild  mid-aneurysmal dilatation and 30-40% distal stenosis.  LAD has 70-80% multiple sequential stenosis in proximal and midportion.  The vessel is very calcified.  Diagonal 1 has 50-60% ostial and proximal stenosis, which is small vessel.  Diagonal 2 has 80-85% ostial stenosis, which is also very small vessel less than 1.25 mm.  Left circumflex has 85-90% proximal bifurcation stenosis with OM-2.  OM-1 is very very small.  OM-2 has 25-30% proximal stenosis.  OM- 3 has 85-90% mid-stenosis.  INTERVENTIONAL PROCEDURE:  Successful PTCA to mid OM-3 was done using 2.5 x  8-mm long Trek balloon for predilatation and then 2.5 x 18-mm long Xience drug-eluting stent was deployed at 11 atmospheric pressure.  Two inflations were done.  Lesion dilated from 85-90% to 0% residual with excellent TIMI grade 3 distal flow.  Successful PTCA to proximal left circumflex was done initially using same 2.5 x 8-mm long Trek balloon and then 2.75 x 12-mm long [place] Trek balloon for predilatation and then 3.0 x 15-mm long Xience Xpedition drug-eluting stent was deployed in proximal left circumflex at 11 atmospheric pressure.  This stent was postdilated using 3.25 x 12-mm long [place] Trek balloon going up to 18 atmospheric pressure.  Lesion was dilated from 85-90% to 0% residual with excellent TIMI grade 3 distal flow without evidence of dissection or distal embolization.  Successful PTCA to mid and proximal LAD was done using initially 2.75 x 20-mm long [place] Trek balloon and then 3.0 x 20-mm long Trek balloon going up to 18 atmospheric pressure for predilatation and then 3.0 x 38-mm long Xience Xpedition drug-eluting stent was deployed at 12 atmospheric pressure and mid and proximal LAD going up to the ostium.  This stent was postdilated initially with 3.25 x 20-mm long [place] Trek balloon going up to 20 atmospheric pressure and then in the midportion of the stent, [place] Sprinter Rx balloon was used, 3.25 x 9-mm long going up to 21 atmospheric pressure.  Lesion was dilated from 70-80% to less than 10% with excellent TIMI grade 3 distal flow without evidence of dissection or distal embolization.  The patient had sluggish flow and very very small diagonal 2 during the procedure.  The patient was started on IV Integrilin during the procedure.  The patient tolerated the procedure well.  There were no complications.  The patient received weight based Angiomax and received 300 mg of Plavix prior to the procedure.  We will repeat 300 mg of Plavix in the recovery room.  We will give 300 mg  of Plavix more in the recovery room and we will continue with Integrilin for 18 hours.  The patient tolerated the procedure well.  There were no complications.  The patient was transferred to recovery room in stable condition.     Eduardo Osier. [name], M.D.     MNH/MEDQ  D:  12/22/2011  T:  12/23/2011  Job:  [id number]

## 2011-12-23 NOTE — Evaluation (Signed)
Physical Therapy Evaluation Patient Details Name: Connor Diaz MRN: [id number] DOB: [birth year] Today's Date: 12/23/2011 Time: 4010-2725 PT Time Calculation (min): 17 min  PT Assessment / Plan / Recommendation Clinical Impression  patient s/p recent pacemaker and now s/p PTCA.  Patient presents with general deconditioning and requiring use of rolling walker for ambulation safety.  Feel patient would benefit from short course of HHPT to progress ambulation to least restrictive assistive device (cane or nothing) and to ensure proper progression of mobility following stent.       PT Assessment  All further PT needs can be met in the next venue of care    Follow Up Recommendations  Home health PT    Does the patient have the potential to tolerate intense rehabilitation      Barriers to Discharge        Equipment Recommendations  Rolling walker with 5" wheels    Recommendations for Other Services     Frequency      Precautions / Restrictions Precautions Precautions: ICD/Pacemaker   Pertinent Vitals/Pain No pain indicated      Mobility  Bed Mobility Bed Mobility: Supine to Sit Supine to Sit: 7: Independent Transfers Transfers: Sit to Stand;Stand to Sit Sit to Stand: 7: Independent;Without upper extremity assist;From bed Stand to Sit: 7: Independent;With upper extremity assist;To bed Ambulation/Gait Ambulation/Gait Assistance: 5: Supervision Ambulation Distance (Feet): 150 Feet Assistive device: Rolling walker Ambulation/Gait Assistance Details: patient with shortness of breath toward end of ambulation Gait Pattern: Step-through pattern    Shoulder Instructions     Exercises     PT Diagnosis: Generalized weakness  PT Problem List: Decreased activity tolerance;Decreased mobility;Cardiopulmonary status limiting activity PT Treatment Interventions:     PT Goals    Visit Information  Last PT Received On: 12/23/11 Assistance Needed: +1    Subjective Data  Subjective: Patient states he hopes he can work on building his energy up after this stent Patient Stated Goal: see above   Prior Functioning  Home Living Lives With: Spouse Available Help at Discharge: Family Type of Home: House Home Access: Stairs to enter Secretary/administrator of Steps: 1 Entrance Stairs-Rails: None Home Layout: One level Home Adaptive Equipment: None Additional Comments: will borrow walker from son Prior Function Level of Independence: Independent Able to Take Stairs?: Yes    Cognition  Overall Cognitive Status: Appears within functional limits for tasks assessed/performed Arousal/Alertness: Awake/alert Orientation Level: Appears intact for tasks assessed Behavior During Session: [facility] for tasks performed    Extremity/Trunk Assessment Right Upper Extremity Assessment RUE ROM/Strength/Tone: [facility] for tasks assessed Left Upper Extremity Assessment LUE ROM/Strength/Tone: WFL for tasks assessed Right Lower Extremity Assessment RLE ROM/Strength/Tone: [facility] for tasks assessed Left Lower Extremity Assessment LLE ROM/Strength/Tone: [facility] for tasks assessed Trunk Assessment Trunk Assessment: Normal   Balance Balance Balance Assessed: No (no obvious balance deficits noted)  End of Session PT - End of Session Activity Tolerance: Patient limited by fatigue Patient left: in bed;with family/visitor present;with call bell/phone within reach Nurse Communication: Mobility status;Other (comment) (d/c needs)  GP Functional Assessment Tool Used: clinical judgement Functional Limitation: Mobility: Walking and moving around Mobility: Walking and Moving Around Current Status [phone]): At least 1 percent but less than 20 percent impaired, limited or restricted Mobility: Walking and Moving Around Goal Status [phone]): 0 percent impaired, limited or restricted Mobility: Walking and Moving Around Discharge Status [phone]): At least 1 percent but less than 20 percent impaired, limited or  restricted   Olivia Canter, [place] [phone]  12/23/2011, 4:17 PM

## 2011-12-23 NOTE — Discharge Summary (Signed)
  Discharge summary dictated on 12/23/2011 dictation number is [id number]

## 2011-12-23 NOTE — Plan of Care (Signed)
Problem: Consults Goal: PCI Patient Education (See Patient Education module for education specifics.)  Outcome: Completed/Met Date Met:  12/23/11 Reviewed PCI education with patient and wife with good verbalized understanding   Problem: Phase II Progression Outcomes Goal: OOB to chair per PCI oders Outcome: Completed/Met Date Met:  12/23/11 OOB to chair for breakfast tolerating well.  Problem: Phase III Progression Outcomes Goal: Cardiac Rehab if ordered Outcome: Completed/Met Date Met:  12/23/11 Cardiac rehab into see patient, ambulate patient and provide education.  Problem: Discharge Progression Outcomes Goal: Discharge plan in place and appropriate Outcome: Completed/Met Date Met:  12/23/11 Discharge home today as planned Goal: Pain controlled with appropriate interventions Outcome: Completed/Met Date Met:  12/23/11 Patient denies any pain or discomfort Goal: Ambulates up to 600 ft. in hall x 1 Outcome: Completed/Met Date Met:  12/23/11 With cardiac rehab ambulated 100 feet Goal: Tolerating diet Outcome: Completed/Met Date Met:  12/23/11 Ate 100% of diet this am Goal: Vascular site scale level 0 - I Vascular Site Scale Level 0: No bruising/bleeding/hematoma Level I (Mild): Bruising/Ecchymosis, minimal bleeding/ooozing, palpable hematoma < 3 cm Level II (Moderate): Bleeding not affecting hemodynamic parameters, pseudoaneurysm, palpable hematoma > 3 cm  Outcome: Completed/Met Date Met:  12/23/11 Level 0 right groin cath site with bandaid dry and intact

## 2011-12-24 NOTE — Care Management Note (Signed)
    Page 1 of 1   12/24/2011     2:16:27 PM   CARE MANAGEMENT NOTE 12/24/2011  Patient:  Connor Diaz, Connor Diaz   Account Number:  [id number]  Date Initiated:  12/24/2011  Documentation initiated by:  CRAFT,TERRI  Subjective/Objective Assessment:   78 yo male admitted 12/22/11 with angina     Action/Plan:   D/C when medically stable   Anticipated DC Date:  12/23/2011   Anticipated DC Plan:  HOME W HOME HEALTH SERVICES      DC Planning Services  CM consult      [facility] Choice  HOME HEALTH           HH arranged  HH-2 PT  HH-4 NURSE'S AIDE      HH agency  [facility]   Status of service:  Completed, signed off  Discharge Disposition:  HOME W HOME HEALTH SERVICES  Per UR Regulation:  Reviewed for med. necessity/level of care/duration of stay  Comments:  12/23/11, Kathi Der RNC-MNN, BSN, [phone], CM received referral.  Hilda Lias at [facility] contacted with order and confirmatrion receiced.

## 2011-12-24 NOTE — Discharge Summary (Signed)
NAMEBARLOW, HARRISON             ACCOUNT NO.:  [id number]  MEDICAL RECORD NO.:  [id number]  LOCATION:  6523                         FACILITY:  [facility]  PHYSICIAN:  Eduardo Osier. [name], M.D. DATE OF BIRTH:  1923-05-23  DATE OF ADMISSION:  12/22/2011 DATE OF DISCHARGE:  12/23/2011                              DISCHARGE SUMMARY   ADMITTING DIAGNOSES: 1. Accelerated angina, multivessel coronary artery disease. 2. Status post symptomatic bradycardia, status post permanent     pacemaker, status post atrial fibrillation. 3. Hypertension. 4. Hypercholesteremia. 5. Abdominal aortic aneurysm. 6. Remote tobacco abuse. 7. Chronic kidney disease stage II. 8. Degenerative joint disease.  DISCHARGE DIAGNOSES: 1. Stable angina, status post multivessel PCI, as per PTCA stent     report to LAD, left circumflex, and obtuse marginal 3. 2. Status post symptomatic bradycardia, status post permanent     pacemaker, status post atrial fibrillation in the past. 3. Hypertension. 4. Hypercholesteremia. 5. Abdominal aortic aneurysm. 6. History of remote tobacco abuse. 7. Chronic kidney disease stage II. 8. Degenerative joint disease. 9. Postop anemia, stable.  DISCHARGE HOME MEDICATIONS: 1. Amiodarone 200 mg 1 tablet daily. 2. Enteric-coated aspirin 81 mg 1 tablet daily. 3. Atorvastatin 80 mg 1 tablet daily. 4. Clopidogrel 75 mg 1 tablet daily. 5. Pepcid 20 mg twice daily. 6. Avodart 0.5 mg every day. 7. Omega-3 fish oil 1 g daily as before. 8. Metoprolol tartrate 25 mg twice daily. 9. Multivitamin 1 tablet daily. 10.Nitrostat 0.4 mg sublingual use as directed. 11.Nitro-Dur patch 0.2 mg/hr daily. 12.Polyethylene glycol p.r.n. as needed. 13.Protonix.  The patient has been advised to stop taking Protonix,     which has been switched to Pepcid.  DIET:  Low salt, low cholesterol.  ACTIVITY:  Increase activity slowly as tolerated.  Post-PTCA stent instructions have been given.  FOLLOWUP:  Follow  up with me in 1 week.  CONDITION AT DISCHARGE:  Stable.  BRIEF HISTORY AND HOSPITAL COURSE:  Mr. Ground is an 78 year old white male with past medical history significant for multivessel coronary artery disease, status post AFib, tachy-brady syndrome with symptomatic bradycardia status post permanent pacemaker, hypertension, hypercholesteremia, abdominal aortic aneurysm, chronic kidney disease stage II, remote tobacco abuse, degenerative joint disease.  Recently was admitted with retrosternal chest pain, pressure, heaviness associated with shortness of breath and feeling weak and tired.  The patient was noted to have AFib with moderate ventricular response and slow ventricular response and spontaneous symptomatic bradycardia, subsequently had left cath and was noted to have multivessel coronary artery disease.  The patient underwent permanent pacemaker and now is admitted for elective PCI.  The patient complains of exertional dyspnea with minimal exertion and feeling tired and fatigued.  States his activity is limited.  Denies any palpitation, lightheadedness, or syncope.  PAST MEDICAL HISTORY:  As above.  PAST SURGICAL HISTORY:  He had permanent pacemaker placed recently.  He had tonsillectomy in the past.  SOCIAL HISTORY:  He is married, retired.  Remote tobacco abuse.  No history of alcohol abuse.  FAMILY HISTORY:  Noncontributory.  HOME MEDICATIONS: 1. He is on amiodarone 200 mg p.o. daily. 2. Nitro-Dur 0.2 mg/hr daily. 3. Enteric-coated aspirin 81 mg p.o. daily. 4. Plavix 75  mg p.o. daily. 5. Lipitor 40 mg p.o. daily. 6. Lopressor 25 mg p.o. b.i.d. 7. Nitrostat sublingual p.r.n. 8. Protonix 40 mg p.o. daily. 9. Avodart 1 p.o. daily. 10.Omega-3, 1 g daily.  PHYSICAL EXAMINATION:  GENERAL:  He is alert, awake, oriented x3, in no acute distress. VITAL SIGNS:  Blood pressure was 140/70, pulse was 72 and regular. HEENT:  Conjunctivae was pink. NECK:  Supple.  No JVD.   No bruit. LUNGS:  Clear to auscultation without rhonchi or rales. CARDIOVASCULAR:  S1 and S2 was normal.  There was soft systolic murmur. ABDOMEN:  Soft.  Bowel sounds were present, nontender. EXTREMITIES:  There is no clubbing, cyanosis, or edema.  LABORATORY DATA:  Sodium was 135, potassium 4.2, BUN 24, creatinine 1.47.  Labs this morning, sodium is 135, potassium 4.4, BUN 17, creatinine 1.31.  His hemoglobin was 12.6, hematocrit 36.1.  This morning hemoglobin is 10.9, hematocrit 32.3, white count of 9.6, platelet count is 159,000.  EKG showed atrial paced rhythm with left bundle-branch block pattern.  BRIEF HOSPITAL COURSE:  The patient was a.m. admit and underwent PTCA stenting to left circumflex obtuse marginal 3 and LAD as per procedure report.  The patient tolerated procedure well.  There were no complications.  Postprocedure, the patient did not have any episodes of chest pain.  During the hospital stay, his groin was stable with no evidence of hematoma or bruit with minimal ecchymoses.  The patient has been ambulating in hallway without any problems.  Phase 1 cardiac rehab was called and the patient walked in the hallway without any problems. The patient will be discharged home on above medications and will be followed up in my office in 1 week and will be scheduled for phase 2 cardiac rehab as outpatient.  Of note, the patient had some unsteady gait earlier this morning.  Will get outpatient OT, PT, and home health aide consults.     Eduardo Osier. [name], M.D.     [facility]  D:  12/23/2011  T:  12/24/2011  Job:  [id number]

## 2011-12-24 NOTE — Progress Notes (Signed)
[facility]  Patient Status: New  [facility] is providing the following services: PT and HHA  If patient discharges after hours, please call [phone].   Connor Diaz 12/24/2011, 8:34 AM

## 2011-12-28 ENCOUNTER — Other Ambulatory Visit (HOSPITAL_COMMUNITY): Payer: Self-pay | Admitting: Family Medicine

## 2011-12-28 ENCOUNTER — Ambulatory Visit (HOSPITAL_COMMUNITY)
Admission: RE | Admit: 2011-12-28 | Discharge: 2011-12-28 | Disposition: A | Discharge status: Home / Self Care | Payer: Medicare Other | Source: Ambulatory Visit | Attending: Family Medicine | Admitting: Family Medicine

## 2011-12-28 DIAGNOSIS — Z7901 Long term (current) use of anticoagulants: Secondary | ICD-10-CM | POA: Insufficient documentation

## 2011-12-28 DIAGNOSIS — J90 Pleural effusion, not elsewhere classified: Secondary | ICD-10-CM | POA: Insufficient documentation

## 2011-12-28 DIAGNOSIS — N183 Chronic kidney disease, stage 3 unspecified: Secondary | ICD-10-CM | POA: Insufficient documentation

## 2011-12-28 DIAGNOSIS — I4891 Unspecified atrial fibrillation: Secondary | ICD-10-CM | POA: Insufficient documentation

## 2011-12-28 DIAGNOSIS — I509 Heart failure, unspecified: Secondary | ICD-10-CM

## 2011-12-28 DIAGNOSIS — I129 Hypertensive chronic kidney disease with stage 1 through stage 4 chronic kidney disease, or unspecified chronic kidney disease: Secondary | ICD-10-CM | POA: Insufficient documentation

## 2012-01-18 ENCOUNTER — Ambulatory Visit (HOSPITAL_COMMUNITY)
Admission: RE | Admit: 2012-01-18 | Discharge: 2012-01-18 | Disposition: A | Discharge status: Home / Self Care | Payer: Medicare Other | Source: Ambulatory Visit | Attending: Family Medicine | Admitting: Family Medicine

## 2012-01-18 ENCOUNTER — Other Ambulatory Visit (HOSPITAL_COMMUNITY): Payer: Self-pay | Admitting: Family Medicine

## 2012-01-18 DIAGNOSIS — R918 Other nonspecific abnormal finding of lung field: Secondary | ICD-10-CM

## 2012-01-18 DIAGNOSIS — I517 Cardiomegaly: Secondary | ICD-10-CM | POA: Insufficient documentation

## 2012-01-18 DIAGNOSIS — J90 Pleural effusion, not elsewhere classified: Secondary | ICD-10-CM | POA: Insufficient documentation

## 2012-01-29 ENCOUNTER — Other Ambulatory Visit (HOSPITAL_COMMUNITY): Payer: Self-pay | Admitting: Family Medicine

## 2012-01-29 DIAGNOSIS — R0602 Shortness of breath: Secondary | ICD-10-CM

## 2012-02-01 ENCOUNTER — Ambulatory Visit (HOSPITAL_COMMUNITY)
Admission: RE | Admit: 2012-02-01 | Discharge: 2012-02-01 | Disposition: A | Discharge status: Home / Self Care | Payer: Medicare Other | Source: Ambulatory Visit | Attending: Family Medicine | Admitting: Family Medicine

## 2012-02-01 DIAGNOSIS — J438 Other emphysema: Secondary | ICD-10-CM | POA: Insufficient documentation

## 2012-02-01 DIAGNOSIS — J189 Pneumonia, unspecified organism: Secondary | ICD-10-CM | POA: Insufficient documentation

## 2012-02-01 DIAGNOSIS — J90 Pleural effusion, not elsewhere classified: Secondary | ICD-10-CM | POA: Insufficient documentation

## 2012-02-01 DIAGNOSIS — I517 Cardiomegaly: Secondary | ICD-10-CM | POA: Insufficient documentation

## 2012-02-01 DIAGNOSIS — R0602 Shortness of breath: Secondary | ICD-10-CM

## 2012-02-11 ENCOUNTER — Encounter: Payer: Self-pay | Admitting: Vascular Surgery

## 2012-02-26 ENCOUNTER — Other Ambulatory Visit: Payer: Self-pay | Admitting: *Deleted

## 2012-02-26 DIAGNOSIS — I714 Abdominal aortic aneurysm, without rupture: Secondary | ICD-10-CM

## 2012-03-04 ENCOUNTER — Ambulatory Visit (INDEPENDENT_AMBULATORY_CARE_PROVIDER_SITE_OTHER): Payer: Medicare Other | Admitting: Internal Medicine

## 2012-03-04 ENCOUNTER — Encounter: Payer: Self-pay | Admitting: Internal Medicine

## 2012-03-04 ENCOUNTER — Encounter: Payer: Self-pay | Admitting: Neurosurgery

## 2012-03-04 VITALS — BP 126/79 | HR 92 | Ht 71.5 in | Wt 192.4 lb

## 2012-03-04 DIAGNOSIS — I498 Other specified cardiac arrhythmias: Secondary | ICD-10-CM

## 2012-03-04 DIAGNOSIS — R001 Bradycardia, unspecified: Secondary | ICD-10-CM | POA: Insufficient documentation

## 2012-03-04 DIAGNOSIS — Z950 Presence of cardiac pacemaker: Secondary | ICD-10-CM

## 2012-03-04 DIAGNOSIS — I2510 Atherosclerotic heart disease of native coronary artery without angina pectoris: Secondary | ICD-10-CM | POA: Insufficient documentation

## 2012-03-04 LAB — PACEMAKER DEVICE OBSERVATION
AL IMPEDENCE PM: 512.5 Ohm
ATRIAL PACING PM: 95
BATTERY VOLTAGE: 2.9779 V
RV LEAD IMPEDENCE PM: 675 Ohm
VENTRICULAR PACING PM: 97

## 2012-03-04 NOTE — Patient Instructions (Addendum)
Your physician wants you to follow-up in: Oct. 2014 with Dr Allred You will receive a reminder letter in the mail two months in advance. If you don't receive a letter, please call our office to schedule the follow-up appointment.  

## 2012-03-04 NOTE — Progress Notes (Signed)
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 He recently presented with symptomatic bradycardia and underwent PPM by me.  He has done very well with his pacemaker.  He also had recent PCI for which he is followed by Dr [name]. Today, he denies symptoms of palpitations, chest pain, shortness of breath,  lower extremity edema, dizziness, presyncope, or syncope.  The patient is otherwise without complaint today.   Past Medical History  Diagnosis Date  . Hypertension   . Arthritis   . AAA (abdominal aortic aneurysm)     a. 02/2009 U/S 4.4 x 4.5 cm  . PAF (paroxysmal atrial fibrillation)     a. noted 11/2011  . Symptomatic bradycardia     a. noted 11/2011  . CKD (chronic kidney disease), stage III   . DJD (degenerative joint disease)   . History of tobacco abuse     a. roughly 36 pack years, quit @ age 27.  Marland Kitchen Pancreatitis     a. 10/2011 - managed conservatively @ home.  Marland Kitchen BPH (benign prostatic hyperplasia)   . Coronary artery disease   . Myocardial infarction     "  SILENT "  . Anginal pain   . Hypothyroidism   . Shortness of breath   . Pacemaker 11/2011   Past Surgical History  Procedure Date  . Tonsillectomy   . Coronary angioplasty with stent placement 12/22/2011     LAD & CIRCUMFLEX  . Pacemaker insertion 11/20/2011    Implanted by [name]     Current Outpatient Prescriptions  Medication Sig Dispense Refill  . amiodarone (PACERONE) 200 MG tablet Take 200 mg by mouth daily.      Marland Kitchen aspirin EC 81 MG EC tablet Take 1 tablet (81 mg total) by mouth daily.  30 tablet  3  . atorvastatin (LIPITOR) 80 MG tablet Take 40 mg by mouth daily at 6 PM.      . BENICAR HCT 40-12.5 MG per tablet       . clopidogrel (PLAVIX) 75 MG tablet Take 75 mg by mouth daily with breakfast.      . dutasteride (AVODART) 0.5 MG capsule Take 0.5 mg by mouth every morning.       . famotidine (PEPCID) 20  MG tablet Take 1 tablet (20 mg total) by mouth 2 (two) times daily.  60 tablet  3  . finasteride (PROSCAR) 5 MG tablet       . fish oil-omega-3 fatty acids 1000 MG capsule Take 1 g by mouth every morning.       Marland Kitchen levothyroxine (SYNTHROID, LEVOTHROID) 50 MCG tablet       . metoprolol tartrate (LOPRESSOR) 25 MG tablet Take 1 tablet (25 mg total) by mouth 2 (two) times daily.  60 tablet  3  . Multiple Vitamin (MULTIVITAMIN WITH MINERALS) TABS Take 1 tablet by mouth daily.      . nitroGLYCERIN (NITRODUR - DOSED IN MG/24 HR) 0.2 mg/hr Place 1 patch onto the skin. Take off at bedtime and replace in the morning      . nitroGLYCERIN (NITROSTAT) 0.4 MG SL tablet Place 1 tablet (0.4 mg total) under the tongue every 5 (five) minutes x 3 doses as needed for chest pain.  25 tablet  3  . polyethylene glycol powder (GLYCOLAX/MIRALAX) powder Take 17 g by mouth daily.       Marland Kitchen PROAIR HFA 108 (90 BASE) MCG/ACT  inhaler         Physical Exam: Filed Vitals:   03/04/12 1212  BP: 126/79  Pulse: 92  Height: 5' 11.5" (1.816 m)  Weight: 192 lb 6.4 oz (87.272 kg)    GEN- The patient is well appearing, alert and oriented x 3 today.   Head- normocephalic, atraumatic Eyes-  Sclera clear, conjunctiva pink Ears- hearing intact Oropharynx- clear Lungs- Clear to ausculation bilaterally, normal work of breathing Chest- pacemaker pocket is well healed Heart- Regular rate and rhythm, no murmurs, rubs or gallops, PMI not laterally displaced GI- soft, NT, ND, + BS Extremities- no clubbing, cyanosis, or edema  Pacemaker interrogation- reviewed in detail today,  See PACEART report  Assessment and Plan:  1. Bradycardia Normal pacemaker function See Pace Art report No changes today  2. CAD No ischemic symptoms  Return in 9 months to the device clinic Other cardiology care per Dr [name]

## 2012-03-07 ENCOUNTER — Encounter: Payer: Self-pay | Admitting: Neurosurgery

## 2012-03-07 ENCOUNTER — Ambulatory Visit (INDEPENDENT_AMBULATORY_CARE_PROVIDER_SITE_OTHER): Payer: Medicare Other | Admitting: Neurosurgery

## 2012-03-07 ENCOUNTER — Encounter (INDEPENDENT_AMBULATORY_CARE_PROVIDER_SITE_OTHER): Payer: Medicare Other | Admitting: *Deleted

## 2012-03-07 VITALS — BP 141/76 | HR 60 | Resp 16 | Ht 72.0 in | Wt 191.5 lb

## 2012-03-07 DIAGNOSIS — I714 Abdominal aortic aneurysm, without rupture: Secondary | ICD-10-CM

## 2012-03-07 NOTE — Addendum Note (Signed)
Addended by: Sharee Pimple on: 03/07/2012 03:10 PM   Modules accepted: Orders

## 2012-03-07 NOTE — Progress Notes (Signed)
[facility] AAA/Carotid Office Note  CC: AAA surveillance Referring Physician: Edilia Bo  History of Present Illness: 79 year old male patient of Dr. Edilia Bo followed for known AAA. The patient denies any unusual abdominal or back pain. The patient denies any new medical diagnoses or recent surgery.  Past Medical History  Diagnosis Date  . Hypertension   . Arthritis   . AAA (abdominal aortic aneurysm)     a. 02/2009 U/S 4.4 x 4.5 cm  . PAF (paroxysmal atrial fibrillation)     a. noted 11/2011  . Symptomatic bradycardia     a. noted 11/2011  . CKD (chronic kidney disease), stage III   . DJD (degenerative joint disease)   . History of tobacco abuse     a. roughly 36 pack years, quit @ age 89.  Marland Kitchen Pancreatitis     a. 10/2011 - managed conservatively @ home.  Marland Kitchen BPH (benign prostatic hyperplasia)   . Coronary artery disease   . Myocardial infarction     "  SILENT "  . Anginal pain   . Hypothyroidism   . Shortness of breath   . Pacemaker 11/2011    ROS: [x]  Positive   [ ]  Denies    General: [ ]  Weight loss, [ ]  Fever, [ ]  chills Neurologic: [ ]  Dizziness, [ ]  Blackouts, [ ]  Seizure [ ]  Stroke, [ ]  "Mini stroke", [ ]  Slurred speech, [ ]  Temporary blindness; [ ]  weakness in arms or legs, [ ]  Hoarseness Cardiac: [ ]  Chest pain/pressure, [ ]  Shortness of breath at rest [ ]  Shortness of breath with exertion, [ ]  Atrial fibrillation or irregular heartbeat Vascular: [ ]  Pain in legs with walking, [ ]  Pain in legs at rest, [ ]  Pain in legs at night,  [ ]  Non-healing ulcer, [ ]  Blood clot in vein/DVT,   Pulmonary: [ ]  Home oxygen, [ ]  Productive cough, [ ]  Coughing up blood, [ ]  Asthma,  [ ]  Wheezing Musculoskeletal:  [ ]  Arthritis, [ ]  Low back pain, [ ]  Joint pain Hematologic: [ ]  Easy Bruising, [ ]  Anemia; [ ]  Hepatitis Gastrointestinal: [ ]  Blood in stool, [ ]  Gastroesophageal Reflux/heartburn, [ ]  Trouble swallowing Urinary: [ ]  chronic Kidney disease, [  ] on HD - [ ]  MWF or [ ]  TTHS, [ ]  Burning with urination, [ ]  Difficulty urinating Skin: [ ]  Rashes, [ ]  Wounds Psychological: [ ]  Anxiety, [ ]  Depression   Social History History  Substance Use Topics  . Smoking status: Former Smoker -- 1.0 packs/day for 35 years    Types: Cigarettes    Quit date: 12/17/1980  . Smokeless tobacco: Never Used     Comment: Quit at age 46.  Marland Kitchen Alcohol Use: No    Family History Family History  Problem Relation Age of Onset  . Other Father     Accidental death @ age 42 - box fell on him at work  . Cancer Mother     died @ 67 Bladder cancer    No Known Allergies  Current Outpatient Prescriptions  Medication Sig Dispense Refill  . amiodarone (PACERONE) 200 MG tablet Take 200 mg by mouth daily.      Marland Kitchen aspirin EC 81 MG EC tablet Take 1 tablet (81 mg total) by mouth daily.  30 tablet  3  . atorvastatin (LIPITOR) 80 MG tablet Take 40 mg by mouth daily at 6 PM.      . BENICAR HCT 40-12.5 MG  per tablet       . clopidogrel (PLAVIX) 75 MG tablet Take 75 mg by mouth daily with breakfast.      . dutasteride (AVODART) 0.5 MG capsule Take 0.5 mg by mouth every morning.       . famotidine (PEPCID) 20 MG tablet Take 1 tablet (20 mg total) by mouth 2 (two) times daily.  60 tablet  3  . finasteride (PROSCAR) 5 MG tablet       . fish oil-omega-3 fatty acids 1000 MG capsule Take 1 g by mouth every morning.       Marland Kitchen levothyroxine (SYNTHROID, LEVOTHROID) 50 MCG tablet       . metoprolol tartrate (LOPRESSOR) 25 MG tablet Take 1 tablet (25 mg total) by mouth 2 (two) times daily.  60 tablet  3  . Multiple Vitamin (MULTIVITAMIN WITH MINERALS) TABS Take 1 tablet by mouth daily.      . nitroGLYCERIN (NITRODUR - DOSED IN MG/24 HR) 0.2 mg/hr Place 1 patch onto the skin. Take off at bedtime and replace in the morning      . nitroGLYCERIN (NITROSTAT) 0.4 MG SL tablet Place 1 tablet (0.4 mg total) under the tongue every 5 (five) minutes x 3 doses as needed for chest pain.  25  tablet  3  . polyethylene glycol powder (GLYCOLAX/MIRALAX) powder Take 17 g by mouth daily.       Marland Kitchen PROAIR HFA 108 (90 BASE) MCG/ACT inhaler         Physical Examination  Filed Vitals:   03/07/12 0920  BP: 141/76  Pulse: 60  Resp: 16    Body mass index is 25.97 kg/(m^2).  General:  WDWN in NAD Gait: Normal HEENT: WNL Eyes: Pupils equal Pulmonary: normal non-labored breathing , without Rales, rhonchi,  wheezing Cardiac: RRR, without  Murmurs, rubs or gallops; Abdomen: soft, NT, no masses Skin: no rashes, ulcers noted  Vascular Exam Pulses: 3+ radial pulses bilaterally, palpable femoral pulses bilaterally, there is a mild palpable nontender abdominal mass Carotid bruits: Carotid pulses to auscultation no bruits are heard Extremities without ischemic changes, no Gangrene , no cellulitis; no open wounds;  Musculoskeletal: no muscle wasting or atrophy   Neurologic: A&O X 3; Appropriate Affect ; SENSATION: normal; MOTOR FUNCTION:  moving all extremities equally. Speech is fluent/normal  Non-Invasive Vascular Imaging AAA duplex today shows a maximum diameter of 4.81 AP by 4.84 transverse which is a slight increase from January 2012 when he was 4.4 x 4.5  ASSESSMENT/PLAN: Asymptomatic AAA that'll followup in 6 months with repeat duplex. The patient's questions were encouraged and answered, he is in agreement with this plan.  Lauree Chandler ANP   Clinic MD: Myra Gianotti

## 2012-03-23 ENCOUNTER — Ambulatory Visit (HOSPITAL_COMMUNITY)
Admission: RE | Admit: 2012-03-23 | Discharge: 2012-03-23 | Disposition: A | Discharge status: Home / Self Care | Payer: Medicare Other | Source: Ambulatory Visit | Attending: Family Medicine | Admitting: Family Medicine

## 2012-03-23 ENCOUNTER — Other Ambulatory Visit (HOSPITAL_COMMUNITY): Payer: Self-pay | Admitting: Family Medicine

## 2012-03-23 DIAGNOSIS — J189 Pneumonia, unspecified organism: Secondary | ICD-10-CM | POA: Insufficient documentation

## 2012-03-23 DIAGNOSIS — Z09 Encounter for follow-up examination after completed treatment for conditions other than malignant neoplasm: Secondary | ICD-10-CM | POA: Insufficient documentation

## 2012-09-07 ENCOUNTER — Ambulatory Visit: Payer: Medicare Other | Admitting: Neurosurgery

## 2012-09-07 ENCOUNTER — Encounter (INDEPENDENT_AMBULATORY_CARE_PROVIDER_SITE_OTHER): Payer: Medicare Other | Admitting: *Deleted

## 2012-09-07 DIAGNOSIS — I714 Abdominal aortic aneurysm, without rupture: Secondary | ICD-10-CM

## 2012-09-08 ENCOUNTER — Other Ambulatory Visit: Payer: Self-pay | Admitting: *Deleted

## 2012-09-08 DIAGNOSIS — I714 Abdominal aortic aneurysm, without rupture: Secondary | ICD-10-CM

## 2012-09-16 ENCOUNTER — Encounter: Payer: Self-pay | Admitting: Vascular Surgery

## 2012-09-21 ENCOUNTER — Other Ambulatory Visit: Payer: Self-pay

## 2012-12-22 ENCOUNTER — Other Ambulatory Visit: Payer: Self-pay

## 2012-12-23 ENCOUNTER — Encounter: Payer: Self-pay | Admitting: Internal Medicine

## 2012-12-23 ENCOUNTER — Ambulatory Visit (INDEPENDENT_AMBULATORY_CARE_PROVIDER_SITE_OTHER): Payer: Medicare Other | Admitting: Internal Medicine

## 2012-12-23 VITALS — BP 124/72 | HR 68 | Ht 71.0 in | Wt 179.2 lb

## 2012-12-23 DIAGNOSIS — I2510 Atherosclerotic heart disease of native coronary artery without angina pectoris: Secondary | ICD-10-CM

## 2012-12-23 DIAGNOSIS — Z950 Presence of cardiac pacemaker: Secondary | ICD-10-CM

## 2012-12-23 DIAGNOSIS — I498 Other specified cardiac arrhythmias: Secondary | ICD-10-CM

## 2012-12-23 DIAGNOSIS — R001 Bradycardia, unspecified: Secondary | ICD-10-CM

## 2012-12-23 MED ORDER — AMIODARONE HCL 200 MG PO TABS: 100.0000 mg | ORAL_TABLET | Freq: Every day | ORAL | Status: DC

## 2012-12-23 NOTE — Progress Notes (Signed)
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 Since his last visit to our office, he has done well. Today, he denies symptoms of palpitations, chest pain, shortness of breath,  lower extremity edema, dizziness, presyncope, or syncope.  The patient is otherwise without complaint today.   Past Medical History  Diagnosis Date  . Hypertension   . Arthritis   . AAA (abdominal aortic aneurysm)     a. 02/2009 U/S 4.4 x 4.5 cm  . PAF (paroxysmal atrial fibrillation)     a. noted 11/2011  . Symptomatic bradycardia     a. noted 11/2011  . CKD (chronic kidney disease), stage III   . DJD (degenerative joint disease)   . History of tobacco abuse     a. roughly 36 pack years, quit @ age 72.  Marland Kitchen Pancreatitis     a. 10/2011 - managed conservatively @ home.  Marland Kitchen BPH (benign prostatic hyperplasia)   . Coronary artery disease   . Myocardial infarction     "  SILENT "  . Anginal pain   . Hypothyroidism   . Shortness of breath   . Pacemaker 11/2011   Past Surgical History  Procedure Laterality Date  . Tonsillectomy    . Coronary angioplasty with stent placement  12/22/2011     LAD & CIRCUMFLEX  . Pacemaker insertion  11/20/2011    Implanted by [name]     Current Outpatient Prescriptions  Medication Sig Dispense Refill  . amiodarone (PACERONE) 200 MG tablet Take 0.5 tablets (100 mg total) by mouth daily.      Marland Kitchen atorvastatin (LIPITOR) 80 MG tablet Take 40 mg by mouth daily at 6 PM.      . BENICAR HCT 40-12.5 MG per tablet       . clopidogrel (PLAVIX) 75 MG tablet Take 75 mg by mouth daily with breakfast.      . dutasteride (AVODART) 0.5 MG capsule Take 0.5 mg by mouth every morning.       . famotidine (PEPCID) 20 MG tablet Take 1 tablet (20 mg total) by mouth 2 (two) times daily.  60 tablet  3  . finasteride (PROSCAR) 5 MG tablet       . fish oil-omega-3 fatty acids 1000 MG capsule Take 1 g  by mouth every morning.       Marland Kitchen levothyroxine (SYNTHROID, LEVOTHROID) 50 MCG tablet       . metoprolol tartrate (LOPRESSOR) 25 MG tablet Take 1 tablet (25 mg total) by mouth 2 (two) times daily.  60 tablet  3  . nitroGLYCERIN (NITROSTAT) 0.4 MG SL tablet Place 1 tablet (0.4 mg total) under the tongue every 5 (five) minutes x 3 doses as needed for chest pain.  25 tablet  3  . polyethylene glycol powder (GLYCOLAX/MIRALAX) powder Take 17 g by mouth daily.       Marland Kitchen PROAIR HFA 108 (90 BASE) MCG/ACT inhaler        No current facility-administered medications for this visit.    Physical Exam: Filed Vitals:   12/23/12 1556  BP: 124/72  Pulse: 68  Height: 5\' 11"  (1.803 m)  Weight: 179 lb 3.2 oz (81.285 kg)    GEN- The patient is well appearing, alert and oriented x 3 today.   Head- normocephalic, atraumatic Eyes-  Sclera clear, conjunctiva pink Ears- hearing intact Oropharynx- clear Lungs- Clear to ausculation bilaterally, normal work of breathing  Chest- pacemaker pocket is well healed Heart- Regular rate and rhythm, no murmurs, rubs or gallops, PMI not laterally displaced GI- soft, NT, ND, + BS Extremities- no clubbing, cyanosis, or edema  Pacemaker interrogation- reviewed in detail today,  See PACEART report  Assessment and Plan:  1. Symptomatic sinus bradycardia Normal pacemaker function See Pace Art report No changes today  2. CAD No ischemic symptoms  3. ? afib He has had no afib since his last visit to our office He is on amiodarone presumptively for prior afib I will decrease amiodarone to 100mg  daily today If he has afib documented in the future then he will require anticoagulation If he has no afib upon return then perhaps we could stop amiodarone  Merlin Return in 1 year  Return in 9 months to the device clinic Other cardiology care per Dr [name]

## 2012-12-23 NOTE — Patient Instructions (Addendum)
Remote monitoring is used to monitor your pacemaker from home. This monitoring reduces the number of office visits required to check your device to one time per year. It allows [place] to keep an eye on the functioning of your device to ensure it is working properly. You are scheduled for a device check from home on 03-27-2013. You may send your transmission at any time that day. If you have a wireless device, the transmission will be sent automatically. After your physician reviews your transmission, you will receive a postcard with your next transmission date.    Your physician wants you to follow-up in: 12 months with Dr Jacquiline Doe will receive a reminder letter in the mail two months in advance. If you don't receive a letter, please call our office to schedule the follow-up appointment.    Your physician has recommended you make the following change in your medication:  1) Decrease Amiodarone to 100mg  daily(1/2 of a 200mg  tablet)

## 2012-12-27 LAB — MDC_IDC_ENUM_SESS_TYPE_INCLINIC
Battery Voltage: 2.95 V
Date Time Interrogation Session: 20141107220441
Implantable Pulse Generator Model: 2210
Implantable Pulse Generator Serial Number: 7394618
Lead Channel Impedance Value: 562.5 Ohm
Lead Channel Pacing Threshold Amplitude: 0.625 V
Lead Channel Pacing Threshold Amplitude: 0.625 V
Lead Channel Pacing Threshold Pulse Width: 0.4 ms
Lead Channel Sensing Intrinsic Amplitude: 12 mV
Lead Channel Setting Pacing Amplitude: 0.875
Lead Channel Setting Sensing Sensitivity: 2 mV

## 2013-02-03 ENCOUNTER — Ambulatory Visit (HOSPITAL_COMMUNITY)
Admission: RE | Admit: 2013-02-03 | Discharge: 2013-02-03 | Disposition: A | Discharge status: Home / Self Care | Payer: Medicare Other | Source: Ambulatory Visit | Attending: Family Medicine | Admitting: Family Medicine

## 2013-02-03 ENCOUNTER — Other Ambulatory Visit (HOSPITAL_COMMUNITY): Payer: Self-pay | Admitting: Family Medicine

## 2013-02-03 DIAGNOSIS — J90 Pleural effusion, not elsewhere classified: Secondary | ICD-10-CM | POA: Insufficient documentation

## 2013-02-03 DIAGNOSIS — Z950 Presence of cardiac pacemaker: Secondary | ICD-10-CM | POA: Insufficient documentation

## 2013-02-03 DIAGNOSIS — R0989 Other specified symptoms and signs involving the circulatory and respiratory systems: Secondary | ICD-10-CM | POA: Insufficient documentation

## 2013-02-03 DIAGNOSIS — J811 Chronic pulmonary edema: Secondary | ICD-10-CM | POA: Insufficient documentation

## 2013-02-03 DIAGNOSIS — R918 Other nonspecific abnormal finding of lung field: Secondary | ICD-10-CM

## 2013-02-03 DIAGNOSIS — I517 Cardiomegaly: Secondary | ICD-10-CM | POA: Insufficient documentation

## 2013-02-22 ENCOUNTER — Emergency Department (HOSPITAL_COMMUNITY): Payer: Medicare Other

## 2013-02-22 ENCOUNTER — Encounter (HOSPITAL_COMMUNITY): Payer: Self-pay | Admitting: Emergency Medicine

## 2013-02-22 ENCOUNTER — Inpatient Hospital Stay (HOSPITAL_COMMUNITY)
Admission: EM | Admit: 2013-02-22 | Discharge: 2013-02-25 | DRG: 291 | Disposition: A | Discharge status: Home / Self Care | Payer: Medicare Other | Attending: Internal Medicine | Admitting: Internal Medicine

## 2013-02-22 DIAGNOSIS — L03116 Cellulitis of left lower limb: Secondary | ICD-10-CM | POA: Diagnosis present

## 2013-02-22 DIAGNOSIS — I129 Hypertensive chronic kidney disease with stage 1 through stage 4 chronic kidney disease, or unspecified chronic kidney disease: Secondary | ICD-10-CM | POA: Diagnosis present

## 2013-02-22 DIAGNOSIS — N183 Chronic kidney disease, stage 3 unspecified: Secondary | ICD-10-CM | POA: Diagnosis present

## 2013-02-22 DIAGNOSIS — E785 Hyperlipidemia, unspecified: Secondary | ICD-10-CM | POA: Diagnosis present

## 2013-02-22 DIAGNOSIS — K219 Gastro-esophageal reflux disease without esophagitis: Secondary | ICD-10-CM | POA: Diagnosis present

## 2013-02-22 DIAGNOSIS — Z8052 Family history of malignant neoplasm of bladder: Secondary | ICD-10-CM

## 2013-02-22 DIAGNOSIS — Z9849 Cataract extraction status, unspecified eye: Secondary | ICD-10-CM

## 2013-02-22 DIAGNOSIS — I509 Heart failure, unspecified: Secondary | ICD-10-CM

## 2013-02-22 DIAGNOSIS — Z950 Presence of cardiac pacemaker: Secondary | ICD-10-CM

## 2013-02-22 DIAGNOSIS — I5020 Unspecified systolic (congestive) heart failure: Secondary | ICD-10-CM

## 2013-02-22 DIAGNOSIS — R001 Bradycardia, unspecified: Secondary | ICD-10-CM

## 2013-02-22 DIAGNOSIS — I5023 Acute on chronic systolic (congestive) heart failure: Secondary | ICD-10-CM

## 2013-02-22 DIAGNOSIS — R0602 Shortness of breath: Secondary | ICD-10-CM | POA: Diagnosis present

## 2013-02-22 DIAGNOSIS — J189 Pneumonia, unspecified organism: Secondary | ICD-10-CM | POA: Diagnosis present

## 2013-02-22 DIAGNOSIS — I714 Abdominal aortic aneurysm, without rupture, unspecified: Secondary | ICD-10-CM | POA: Diagnosis present

## 2013-02-22 DIAGNOSIS — J9691 Respiratory failure, unspecified with hypoxia: Secondary | ICD-10-CM | POA: Diagnosis present

## 2013-02-22 DIAGNOSIS — J9600 Acute respiratory failure, unspecified whether with hypoxia or hypercapnia: Secondary | ICD-10-CM | POA: Diagnosis present

## 2013-02-22 DIAGNOSIS — I2510 Atherosclerotic heart disease of native coronary artery without angina pectoris: Secondary | ICD-10-CM | POA: Diagnosis present

## 2013-02-22 DIAGNOSIS — I5041 Acute combined systolic (congestive) and diastolic (congestive) heart failure: Principal | ICD-10-CM | POA: Diagnosis present

## 2013-02-22 DIAGNOSIS — Z7901 Long term (current) use of anticoagulants: Secondary | ICD-10-CM

## 2013-02-22 DIAGNOSIS — E039 Hypothyroidism, unspecified: Secondary | ICD-10-CM | POA: Diagnosis present

## 2013-02-22 DIAGNOSIS — I4891 Unspecified atrial fibrillation: Secondary | ICD-10-CM | POA: Diagnosis present

## 2013-02-22 DIAGNOSIS — Z9861 Coronary angioplasty status: Secondary | ICD-10-CM

## 2013-02-22 DIAGNOSIS — L02419 Cutaneous abscess of limb, unspecified: Secondary | ICD-10-CM | POA: Diagnosis present

## 2013-02-22 DIAGNOSIS — L03119 Cellulitis of unspecified part of limb: Secondary | ICD-10-CM

## 2013-02-22 DIAGNOSIS — I252 Old myocardial infarction: Secondary | ICD-10-CM

## 2013-02-22 DIAGNOSIS — J180 Bronchopneumonia, unspecified organism: Secondary | ICD-10-CM | POA: Diagnosis present

## 2013-02-22 DIAGNOSIS — M1990 Unspecified osteoarthritis, unspecified site: Secondary | ICD-10-CM | POA: Diagnosis present

## 2013-02-22 DIAGNOSIS — N400 Enlarged prostate without lower urinary tract symptoms: Secondary | ICD-10-CM | POA: Diagnosis present

## 2013-02-22 DIAGNOSIS — Z87891 Personal history of nicotine dependence: Secondary | ICD-10-CM

## 2013-02-22 HISTORY — DX: Unspecified systolic (congestive) heart failure: I50.20

## 2013-02-22 LAB — CBC
HCT: 41.2 % (ref 39.0–52.0)
Hemoglobin: 14.2 g/dL (ref 13.0–17.0)
MCH: 34 pg (ref 26.0–34.0)
MCHC: 34.5 g/dL (ref 30.0–36.0)
MCV: 98.6 fL (ref 78.0–100.0)
PLATELETS: 186 10*3/uL (ref 150–400)
RBC: 4.18 MIL/uL — AB (ref 4.22–5.81)
RDW: 14.9 % (ref 11.5–15.5)
WBC: 9 10*3/uL (ref 4.0–10.5)

## 2013-02-22 LAB — CG4 I-STAT (LACTIC ACID): Lactic Acid, Venous: 1.05 mmol/L (ref 0.5–2.2)

## 2013-02-22 LAB — MAGNESIUM: MAGNESIUM: 2.1 mg/dL (ref 1.5–2.5)

## 2013-02-22 LAB — COMPREHENSIVE METABOLIC PANEL
ALT: 7 U/L (ref 0–53)
AST: 17 U/L (ref 0–37)
Albumin: 3.5 g/dL (ref 3.5–5.2)
Alkaline Phosphatase: 83 U/L (ref 39–117)
BILIRUBIN TOTAL: 1 mg/dL (ref 0.3–1.2)
BUN: 24 mg/dL — AB (ref 6–23)
CHLORIDE: 104 meq/L (ref 96–112)
CO2: 24 meq/L (ref 19–32)
Calcium: 9 mg/dL (ref 8.4–10.5)
Creatinine, Ser: 1.34 mg/dL (ref 0.50–1.35)
GFR calc Af Amer: 53 mL/min — ABNORMAL LOW (ref >90)
GFR, EST NON AFRICAN AMERICAN: 46 mL/min — AB (ref >90)
Glucose, Bld: 93 mg/dL (ref 70–99)
Potassium: 5.3 mEq/L (ref 3.7–5.3)
Sodium: 140 mEq/L (ref 137–147)
Total Protein: 6.6 g/dL (ref 6.0–8.3)

## 2013-02-22 LAB — STREP PNEUMONIAE URINARY ANTIGEN: STREP PNEUMO URINARY ANTIGEN: NEGATIVE

## 2013-02-22 LAB — TROPONIN I: Troponin I: <0.3 ng/mL (ref <0.30)

## 2013-02-22 LAB — PRO B NATRIURETIC PEPTIDE: Pro B Natriuretic peptide (BNP): 6923 pg/mL — ABNORMAL HIGH (ref 0–450)

## 2013-02-22 LAB — INFLUENZA PANEL BY PCR (TYPE A & B)
H1N1 flu by pcr: NOT DETECTED
Influenza A By PCR: NEGATIVE
Influenza B By PCR: NEGATIVE

## 2013-02-22 LAB — POCT I-STAT TROPONIN I: TROPONIN I, POC: 0.01 ng/mL (ref 0.00–0.08)

## 2013-02-22 MED ORDER — POLYETHYLENE GLYCOL 3350 17 G PO PACK
17.0000 g | PACK | Freq: Every day | ORAL | Status: DC
  Administered 2013-02-22 – 2013-02-25 (×4): 17 g via ORAL
  Filled 2013-02-22 (×4): qty 1

## 2013-02-22 MED ORDER — POLYETHYLENE GLYCOL 3350 17 GM/SCOOP PO POWD
17.0000 g | Freq: Every day | ORAL | Status: DC | PRN
  Filled 2013-02-22: qty 255

## 2013-02-22 MED ORDER — FAMOTIDINE 20 MG PO TABS
20.0000 mg | ORAL_TABLET | Freq: Two times a day (BID) | ORAL | Status: DC
  Administered 2013-02-22: 21:00:00 20 mg via ORAL
  Filled 2013-02-22 (×4): qty 1

## 2013-02-22 MED ORDER — NITROGLYCERIN 0.4 MG SL SUBL
0.4000 mg | SUBLINGUAL_TABLET | SUBLINGUAL | Status: DC | PRN
Start: 2013-02-22 — End: 2013-02-25
  Administered 2013-02-22: 0.4 mg via SUBLINGUAL
  Filled 2013-02-22: qty 25

## 2013-02-22 MED ORDER — FUROSEMIDE 10 MG/ML IJ SOLN
20.0000 mg | Freq: Once | INTRAMUSCULAR | Status: AC
  Administered 2013-02-22: 20 mg via INTRAVENOUS
  Filled 2013-02-22: qty 2

## 2013-02-22 MED ORDER — AZITHROMYCIN 500 MG IV SOLR: 500.0000 mg | INTRAVENOUS | Status: DC

## 2013-02-22 MED ORDER — NITROGLYCERIN 2 % TD OINT
1.0000 [in_us] | TOPICAL_OINTMENT | Freq: Once | TRANSDERMAL | Status: AC
  Administered 2013-02-22: 1 [in_us] via TOPICAL
  Filled 2013-02-22: qty 1

## 2013-02-22 MED ORDER — FUROSEMIDE 20 MG PO TABS: 40.0000 mg | ORAL_TABLET | Freq: Every day | ORAL | Status: DC

## 2013-02-22 MED ORDER — ATORVASTATIN CALCIUM 40 MG PO TABS
40.0000 mg | ORAL_TABLET | Freq: Every day | ORAL | Status: DC
  Administered 2013-02-22 – 2013-02-24 (×3): 40 mg via ORAL
  Filled 2013-02-22 (×4): qty 1

## 2013-02-22 MED ORDER — LEVOTHYROXINE SODIUM 50 MCG PO TABS
50.0000 ug | ORAL_TABLET | Freq: Every day | ORAL | Status: DC
  Administered 2013-02-23 – 2013-02-25 (×3): 50 ug via ORAL
  Filled 2013-02-22 (×5): qty 1

## 2013-02-22 MED ORDER — AMIODARONE HCL 100 MG PO TABS
100.0000 mg | ORAL_TABLET | Freq: Every day | ORAL | Status: DC
  Administered 2013-02-22 – 2013-02-25 (×4): 100 mg via ORAL
  Filled 2013-02-22 (×5): qty 1

## 2013-02-22 MED ORDER — DEXTROSE 5 % IV SOLN
1.0000 g | Freq: Once | INTRAVENOUS | Status: AC
  Administered 2013-02-22: 1 g via INTRAVENOUS
  Filled 2013-02-22: qty 10

## 2013-02-22 MED ORDER — HYDROCHLOROTHIAZIDE 12.5 MG PO CAPS: 12.5000 mg | ORAL_CAPSULE | Freq: Every day | ORAL | Status: DC | PRN

## 2013-02-22 MED ORDER — FUROSEMIDE 10 MG/ML IJ SOLN: 20.0000 mg | Freq: Every day | INTRAMUSCULAR | Status: DC

## 2013-02-22 MED ORDER — OLMESARTAN MEDOXOMIL-HCTZ 40-12.5 MG PO TABS: 1.0000 | ORAL_TABLET | Freq: Every day | ORAL | Status: DC | PRN

## 2013-02-22 MED ORDER — NITROGLYCERIN 2 % TD OINT
0.5000 [in_us] | TOPICAL_OINTMENT | Freq: Four times a day (QID) | TRANSDERMAL | Status: DC
  Administered 2013-02-22: 0.5 [in_us] via TOPICAL
  Filled 2013-02-22: qty 1
  Filled 2013-02-22: qty 8

## 2013-02-22 MED ORDER — FINASTERIDE 5 MG PO TABS
5.0000 mg | ORAL_TABLET | Freq: Every day | ORAL | Status: DC
  Administered 2013-02-22 – 2013-02-25 (×4): 5 mg via ORAL
  Filled 2013-02-22 (×4): qty 1

## 2013-02-22 MED ORDER — NITROGLYCERIN 0.4 MG SL SUBL: 0.4000 mg | SUBLINGUAL_TABLET | SUBLINGUAL | Status: DC | PRN

## 2013-02-22 MED ORDER — DEXTROSE 5 % IV SOLN
500.0000 mg | INTRAVENOUS | Status: DC
  Filled 2013-02-22: qty 500

## 2013-02-22 MED ORDER — MUPIROCIN CALCIUM 2 % EX CREA
TOPICAL_CREAM | Freq: Two times a day (BID) | CUTANEOUS | Status: DC
  Administered 2013-02-22 – 2013-02-25 (×6): via TOPICAL
  Filled 2013-02-22 (×4): qty 15

## 2013-02-22 MED ORDER — DUTASTERIDE 0.5 MG PO CAPS
0.5000 mg | ORAL_CAPSULE | Freq: Every morning | ORAL | Status: DC
Start: 2013-02-22 — End: 2013-02-22
  Filled 2013-02-22: qty 1

## 2013-02-22 MED ORDER — DEXTROSE 5 % IV SOLN
500.0000 mg | Freq: Once | INTRAVENOUS | Status: AC
  Administered 2013-02-22: 500 mg via INTRAVENOUS

## 2013-02-22 MED ORDER — CARVEDILOL 3.125 MG PO TABS
3.1250 mg | ORAL_TABLET | Freq: Two times a day (BID) | ORAL | Status: DC
  Administered 2013-02-22 – 2013-02-24 (×5): 3.125 mg via ORAL
  Filled 2013-02-22 (×9): qty 1

## 2013-02-22 MED ORDER — FUROSEMIDE 40 MG PO TABS
40.0000 mg | ORAL_TABLET | Freq: Every day | ORAL | Status: DC
  Administered 2013-02-23 – 2013-02-25 (×3): 40 mg via ORAL
  Filled 2013-02-22 (×3): qty 1

## 2013-02-22 MED ORDER — ALBUTEROL SULFATE (2.5 MG/3ML) 0.083% IN NEBU: 2.5000 mg | INHALATION_SOLUTION | Freq: Four times a day (QID) | RESPIRATORY_TRACT | Status: DC | PRN

## 2013-02-22 MED ORDER — OMEGA-3-ACID ETHYL ESTERS 1 G PO CAPS
1.0000 g | ORAL_CAPSULE | Freq: Every day | ORAL | Status: DC
  Administered 2013-02-22 – 2013-02-25 (×4): 1 g via ORAL
  Filled 2013-02-22 (×4): qty 1

## 2013-02-22 MED ORDER — METOPROLOL TARTRATE 25 MG PO TABS: 25.0000 mg | ORAL_TABLET | Freq: Two times a day (BID) | ORAL | Status: DC

## 2013-02-22 MED ORDER — DEXTROSE 5 % IV SOLN: 1.0000 g | INTRAVENOUS | Status: DC

## 2013-02-22 MED ORDER — OMEGA-3 FATTY ACIDS 1000 MG PO CAPS: 1.0000 g | ORAL_CAPSULE | Freq: Every morning | ORAL | Status: DC

## 2013-02-22 MED ORDER — CLOPIDOGREL BISULFATE 75 MG PO TABS
75.0000 mg | ORAL_TABLET | Freq: Every day | ORAL | Status: DC
  Administered 2013-02-23 – 2013-02-25 (×3): 75 mg via ORAL
  Filled 2013-02-22 (×4): qty 1

## 2013-02-22 MED ORDER — DEXTROSE 5 % IV SOLN
1.0000 g | INTRAVENOUS | Status: DC
  Administered 2013-02-23 – 2013-02-25 (×3): 1 g via INTRAVENOUS
  Filled 2013-02-22 (×3): qty 10

## 2013-02-22 MED ORDER — IRBESARTAN 300 MG PO TABS
300.0000 mg | ORAL_TABLET | Freq: Every day | ORAL | Status: DC | PRN
  Filled 2013-02-22: qty 1

## 2013-02-22 MED ORDER — ENOXAPARIN SODIUM 40 MG/0.4ML ~~LOC~~ SOLN
40.0000 mg | SUBCUTANEOUS | Status: DC
  Administered 2013-02-22 – 2013-02-24 (×3): 40 mg via SUBCUTANEOUS
  Filled 2013-02-22 (×6): qty 0.4

## 2013-02-22 MED ORDER — SODIUM CHLORIDE 0.9 % IJ SOLN: 3.0000 mL | INTRAMUSCULAR | Status: DC | PRN

## 2013-02-22 MED ORDER — FUROSEMIDE 10 MG/ML IJ SOLN
20.0000 mg | Freq: Once | INTRAMUSCULAR | Status: DC
Start: 2013-02-22 — End: 2013-02-22
  Filled 2013-02-22: qty 2

## 2013-02-22 MED ORDER — ALBUTEROL SULFATE HFA 108 (90 BASE) MCG/ACT IN AERS: 2.0000 | INHALATION_SPRAY | Freq: Four times a day (QID) | RESPIRATORY_TRACT | Status: DC | PRN

## 2013-02-22 NOTE — ED Notes (Signed)
Dr. Bednar at bedside. 

## 2013-02-22 NOTE — ED Notes (Signed)
Meal ordered for pt 

## 2013-02-22 NOTE — Consult Note (Signed)
Reason for Consult: Congestive heart failure/depressed LV systolic function Referring Physician: Teaching service  Connor Diaz is an 80 y.o. male.  HPI: Patient is 80 year old male with past medical history significant for multiple medical problems i.e. coronary artery disease history of silent MI in the past noted to have multivessel coronary artery disease requiring PTCA stenting to proximal and mid LAD (3.0x38 mm long Xience expedition drug-eluting stent ), PTCA stenting to left circumflex (3.0x15 mm long Xience expedition drug-eluting stent), and 2.5x18 mm long Xience expedition drug-eluting stent in OM 3 in November of 2013, tachybradycardia syndrome status post permanent pacemaker, hypertension, hypercholesteremia, history of paroxysmal A. fib, chronic kidney disease stage II, and degenerative joint disease, remote tobacco abuse, history of abdominal aortic aneurysm, was referred to the ER for admission by his PMD has he had 2-D echo as outpatient noted to have markedly depressed LV systolic function associated with progressive increasing shortness of breath for 2-3 weeks. Patient states he has been taking questionable antibiotics for last 2 weeks because of progressive increasing shortness of breath without much improvement and this morning had 2-D echo done at his PMDs office which showed 2+ AI and EF of approximately 20-25% and was referred to [facility] for admission. Patient denies any chest pain pressure tightness heaviness nausea vomiting or diaphoresis. Denies any history of PND orthopnea leg swelling. Denies any palpitation lightheadedness or syncope. Denies any fever or chills. Patient was noted to have markedly elevated pro BNP and chest x-ray was suggestive of possible bilateral pneumonia and CHF. Patient has been evaluated recently by Dr. Rayann Heman for permanent pacemaker .  Past Medical History  Diagnosis Date  . Hypertension   . Arthritis   . AAA (abdominal aortic aneurysm)      a. 02/2009 U/S 4.4 x 4.5 cm  . PAF (paroxysmal atrial fibrillation)     a. noted 11/2011  . Symptomatic bradycardia     a. noted 11/2011  . CKD (chronic kidney disease), stage III   . DJD (degenerative joint disease)   . History of tobacco abuse     a. roughly 36 pack years, quit @ age 58.  Marland Kitchen Pancreatitis     a. 10/2011 - managed conservatively @ home.  Marland Kitchen BPH (benign prostatic hyperplasia)   . Coronary artery disease   . Myocardial infarction     "  SILENT "  . Anginal pain   . Hypothyroidism   . Shortness of breath   . Pacemaker 11/2011    Past Surgical History  Procedure Laterality Date  . Tonsillectomy    . Coronary angioplasty with stent placement  12/22/2011     LAD & CIRCUMFLEX  . Pacemaker insertion  11/20/2011    Implanted by Dr Rayann Heman     Family History  Problem Relation Age of Onset  . Other Father     Accidental death @ age 37 - box fell on him at work  . Cancer Mother     died @ 80 Bladder cancer    Social History:  reports that he quit smoking about 32 years ago. His smoking use included Cigarettes. He has a 35 pack-year smoking history. He has never used smokeless tobacco. He reports that he does not drink alcohol or use illicit drugs.  Allergies: No Known Allergies  Medications: I have reviewed the patient's current medications.  Results for orders placed during the hospital encounter of 02/22/13 (from the past 48 hour(s))  CBC     Status: Abnormal  Collection Time    02/22/13 11:23 AM      Result Value Range   WBC 9.0  4.0 - 10.5 K/uL   RBC 4.18 (*) 4.22 - 5.81 MIL/uL   Hemoglobin 14.2  13.0 - 17.0 g/dL   HCT 71.4  68.9 - 72.8 %   MCV 98.6  78.0 - 100.0 fL   MCH 34.0  26.0 - 34.0 pg   MCHC 34.5  30.0 - 36.0 g/dL   RDW 18.7  97.6 - 66.2 %   Platelets 186  150 - 400 K/uL  PRO B NATRIURETIC PEPTIDE     Status: Abnormal   Collection Time    02/22/13 11:23 AM      Result Value Range   Pro B Natriuretic peptide (BNP) 6923.0 (*) 0 - 450 pg/mL   COMPREHENSIVE METABOLIC PANEL     Status: Abnormal   Collection Time    02/22/13 11:23 AM      Result Value Range   Sodium 140  137 - 147 mEq/L   Potassium 5.3  3.7 - 5.3 mEq/L   Chloride 104  96 - 112 mEq/L   CO2 24  19 - 32 mEq/L   Glucose, Bld 93  70 - 99 mg/dL   BUN 24 (*) 6 - 23 mg/dL   Creatinine, Ser 0.46  0.50 - 1.35 mg/dL   Calcium 9.0  8.4 - 58.6 mg/dL   Total Protein 6.6  6.0 - 8.3 g/dL   Albumin 3.5  3.5 - 5.2 g/dL   AST 17  0 - 37 U/L   ALT 7  0 - 53 U/L   Alkaline Phosphatase 83  39 - 117 U/L   Total Bilirubin 1.0  0.3 - 1.2 mg/dL   GFR calc non Af Amer 46 (*) >90 mL/min   GFR calc Af Amer 53 (*) >90 mL/min   Comment: (NOTE)     The eGFR has been calculated using the CKD EPI equation.     This calculation has not been validated in all clinical situations.     eGFR's persistently <90 mL/min signify possible Chronic Kidney     Disease.  POCT I-STAT TROPONIN I     Status: None   Collection Time    02/22/13 12:06 PM      Result Value Range   Troponin i, poc 0.01  0.00 - 0.08 ng/mL   Comment 3            Comment: Due to the release kinetics of cTnI,     a negative result within the first hours     of the onset of symptoms does not rule out     myocardial infarction with certainty.     If myocardial infarction is still suspected,     repeat the test at appropriate intervals.  CG4 I-STAT (LACTIC ACID)     Status: None   Collection Time    02/22/13  3:31 PM      Result Value Range   Lactic Acid, Venous 1.05  0.5 - 2.2 mmol/L  TROPONIN I     Status: None   Collection Time    02/22/13  4:08 PM      Result Value Range   Troponin I <0.30  <0.30 ng/mL   Comment:            Due to the release kinetics of cTnI,     a negative result within the first hours     of the onset  of symptoms does not rule out     myocardial infarction with certainty.     If myocardial infarction is still suspected,     repeat the test at appropriate intervals.    Dg Chest 2  View  02/22/2013   CLINICAL DATA:  Difficulty breathing.  Weakness.  EXAM: CHEST  2 VIEW  COMPARISON:  Chest x-ray 02/03/2013.  FINDINGS: Patchy ill-defined airspace opacities throughout the right lung, and the left lung base concerning for potential multilobar bronchopneumonia. Relative sparing of the left upper lobe. Small bilateral pleural effusions. Pulmonary vasculature is within normal limits. Heart size appears mildly enlarged. The patient is rotated to the right on today's exam, resulting in distortion of the mediastinal contours and reduced diagnostic sensitivity and specificity for mediastinal pathology. Atherosclerosis in the thoracic aorta. Left-sided pacemaker device in place with lead tips projecting over the expected location of the right atrium and right ventricular free wall.  IMPRESSION: 1. Interval development of patchy multifocal airspace disease in the left lower lobe and throughout the right lung, concerning for developing multilobar bronchopneumonia. 2. Small bilateral pleural effusions. 3. Mild cardiomegaly. 4. Atherosclerosis.   Electronically Signed   By: Vinnie Langton M.D.   On: 02/22/2013 13:12    Review of Systems  Constitutional: Negative for fever and chills.  Eyes: Negative for double vision and photophobia.  Respiratory: Positive for shortness of breath. Negative for sputum production and wheezing.   Cardiovascular: Negative for chest pain, palpitations, orthopnea, claudication and leg swelling.  Gastrointestinal: Negative for nausea and vomiting.  Genitourinary: Negative for dysuria and urgency.  Neurological: Negative for dizziness and headaches.   Blood pressure 126/58, pulse 62, temperature 97.5 F (36.4 C), temperature source Oral, resp. rate 24, SpO2 95.00%. Physical Exam  Constitutional: He is oriented to person, place, and time.  HENT:  Head: Normocephalic and atraumatic.  Eyes: Conjunctivae are normal. Left eye exhibits no discharge. No scleral icterus.   Neck: Normal range of motion. Neck supple. No JVD present. No tracheal deviation present. No thyromegaly present.  Cardiovascular: Normal rate and regular rhythm.   Murmur (Soft  diastolic murmur and S3 gallop noted) heard. Respiratory:  Decreased breath sound at bases with bilateral lateral rhonchi and rales  GI: Soft. Bowel sounds are normal. He exhibits no distension. There is no tenderness.  Musculoskeletal: He exhibits no edema and no tenderness.  Neurological: He is alert and oriented to person, place, and time.    Assessment/Plan: Acute decompensated systolic heart failure with markedly depressed LV systolic function rule out ischemia rule out MI Coronary artery disease history of silent MI in the past with occluded RCA Multivessel CAD status post PCI to LAD left circumflex and OM 3 as above in November 2013 Probable multilobar bilateral pneumonia Tachybradycardia syndrome status post permanent pacemaker History of paroxysmal A. fib Hypertension Hypercholesteremia Hypothyroidism Chronic kidney disease stage II Degenerative joint disease Remote tobacco abuse Abdominal aortic aneurysm Plan Agree with present management Check serial enzymes and EKG Add low-dose Lasix Change Lopressor to carvedilol as per orders Monitor renal function closely will add ACE inhibitor as renal function and blood pressure permits Will get 2-D echo report from [place] office May need nuclear stress test once fully compensated and pneumonia resolved to evaluate for ischemia Consider an EP consult for pacemaker interrogation and may need chronic anticoagulation if noted to have chronic atrial fibrillation.   Tamica Covell N 02/22/2013, 5:25 PM

## 2013-02-22 NOTE — ED Notes (Signed)
phlebotomy at bedside.  

## 2013-02-22 NOTE — H&P (Signed)
Date: 02/22/2013               Patient Name:  Connor Diaz MRN: [id number]  DOB: [birth year] Age / Sex: 80 y.o., male   PCP: [name], MD         Medical Service: Internal Medicine Teaching Service         Attending Physician: Dr. Jonah Blue, MD    First Contact: Dr. Vivi Barrack Pager: [phone]  Second Contact: Dr. Desma Maxim Pager: [phone]       After Hours (After 5p/  First Contact Pager: [phone]  weekends / holidays): Second Contact Pager: [phone]   Chief Complaint: Shortness of breath  History of Present Illness:  Connor Diaz is a 80 y.o. male with PMH HTN, multivessel coronary artery disease s/p LHC and multivessel stenting (left circumflex, LAD, and obtuse marginal 3) in 12/2011 by Dr. 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  Patient reports that for the past 2-3 weeks he has experienced exertional dyspnea, not present at rest, gradually worsening over the past few days. He denies cough, fever, chills, sore throat, rhinorrhea, nausea, vomiting, diarrhea. He denies leg swelling or orthopnea, but does endorse coinciding PND-type symptoms for the past 2-3 weeks. He presents today after seeing his PCP, Dr. Oval Linsey, this morning who performed an echocardiogram in his office showing reduced EF 25-30%, dilated LV with hypokinesis, marked AI (I will place report in shadow chart). Patient has no history of heart failure, and his last 2D echo in 11/2011 was within normal limits, EF 50-55%.   His cardiologist is Dr. [name]. He was last seen in clinic on 12/23/12. At that time they interrogated his pacemaker and decreased his amiodarone to 100mg  daily. He was evaluated recently by Dr. Johney Frame for permanent pacemaker. He is recovering  from a LLE cellulitis for which he completed a 14-day course of Keflex. Denies tobacco, alcohol, or drug use. He has not been hospitalized in the past 3 months.  In the ED he received Lasix 20mg  IV x1, NG ointment 1 inch x1, SL NG 0.4mg  x1, azithromycin 500mg  IV x1, ceftriaxone 1g IV x1.   Meds:  Current Outpatient Prescriptions  Medication Sig Dispense Refill  . amiodarone (PACERONE) 200 MG tablet Take 0.5 tablets (100 mg total) by mouth daily.      Marland Kitchen atorvastatin (LIPITOR) 80 MG tablet Take 40 mg by mouth daily at 6 PM.      . BENICAR HCT 40-12.5 MG per tablet Take 1 tablet by mouth daily as needed (for blood pressure).       . clopidogrel (PLAVIX) 75 MG tablet Take 75 mg by mouth daily with breakfast.      . dutasteride (AVODART) 0.5 MG capsule Take 0.5 mg by mouth every morning.       . famotidine (PEPCID) 20 MG tablet Take 1 tablet (20 mg total) by mouth 2 (two) times daily.  60 tablet  3  . finasteride (PROSCAR) 5 MG tablet Take 5 mg by mouth daily.       . fish oil-omega-3 fatty acids 1000 MG capsule Take 1 g by mouth every morning.       Marland Kitchen levothyroxine (SYNTHROID, LEVOTHROID) 50 MCG tablet Take 50 mcg by mouth daily before breakfast.       . metoprolol tartrate (LOPRESSOR) 25  MG tablet Take 1 tablet (25 mg total) by mouth 2 (two) times daily.  60 tablet  3  . nitroGLYCERIN (NITROSTAT) 0.4 MG SL tablet Place 1 tablet (0.4 mg total) under the tongue every 5 (five) minutes x 3 doses as needed for chest pain.  25 tablet  3  . polyethylene glycol powder (GLYCOLAX/MIRALAX) powder Take 17 g by mouth daily as needed for moderate constipation.       Marland Kitchen. PROAIR HFA 108 (90 BASE) MCG/ACT inhaler Inhale 2 puffs into the lungs every 6 (six) hours as needed for shortness of breath.         Allergies: Allergies as of 02/22/2013  . (No Known Allergies)   Past Medical History  Diagnosis Date  . Hypertension   . Arthritis   . AAA (abdominal aortic aneurysm)     a. 02/2009 U/S 4.4 x 4.5 cm  .  PAF (paroxysmal atrial fibrillation)     a. noted 11/2011  . Symptomatic bradycardia     a. noted 11/2011  . CKD (chronic kidney disease), stage III   . DJD (degenerative joint disease)   . History of tobacco abuse     a. roughly 36 pack years, quit @ age 80.  Marland Kitchen. Pancreatitis     a. 10/2011 - managed conservatively @ home.  Marland Kitchen. BPH (benign prostatic hyperplasia)   . Coronary artery disease   . Myocardial infarction     "  SILENT "  . Anginal pain   . Hypothyroidism   . Shortness of breath   . Pacemaker 11/2011   Past Surgical History  Procedure Laterality Date  . Tonsillectomy    . Coronary angioplasty with stent placement  12/22/2011     LAD & CIRCUMFLEX  . Pacemaker insertion  11/20/2011    Implanted by Dr Johney FrameAllred    Family History  Problem Relation Age of Onset  . Other Father     Accidental death @ age 80 - box fell on him at work  . Cancer Mother     died @ 4980 Bladder cancer   History   Social History  . Marital Status: Married    Spouse Name: N/A    Number of Children: N/A  . Years of Education: N/A   Occupational History  . Not on file.   Social History Main Topics  . Smoking status: Former Smoker -- 1.00 packs/day for 35 years    Types: Cigarettes    Quit date: 12/17/1980  . Smokeless tobacco: Never Used     Comment: Quit at age 80.  Marland Kitchen. Alcohol Use: No  . Drug Use: No  . Sexual Activity: Not on file   Other Topics Concern  . Not on file   Social History Narrative   [place] in [place].  He is retired from [employer].  He is relatively active @ home.    Review of Systems: Pertinent items are noted in HPI.  Physical Exam: Blood pressure 144/75, pulse 59, temperature 97.5 F (36.4 C), temperature source Oral, resp. rate 25, SpO2 98.00%. Physical Exam  Constitutional: He is oriented to person, place, and time and well-developed, well-nourished, and in no distress.  HENT:  Head: Normocephalic and atraumatic.  Eyes:  Conjunctivae and EOM are normal. Pupils are equal, round, and reactive to light.  Neck: Normal range of motion. Neck supple. No tracheal deviation present.  Cardiovascular: Normal rate and regular rhythm.  Exam reveals no gallop and no friction rub.  No murmur heard. Difficult auscultation. In paced NSR on monitor.  Pulmonary/Chest: Accessory muscle usage (Some belly breathing.) present. No stridor. No respiratory distress. He has wheezes (Anterior lung fields). He has rhonchi (Soft crackles in left lung base.). He has no rales. He exhibits no tenderness.  Abdominal: Soft. Bowel sounds are normal. He exhibits no distension and no mass. There is no tenderness. There is no rebound and no guarding.  No ascites.  Musculoskeletal: Normal range of motion. He exhibits no edema and no tenderness.  No LE edema.  Neurological: He is alert and oriented to person, place, and time. No cranial nerve deficit. GCS score is 15.  Skin: Skin is warm and dry. Rash (Erythema of the LLE, no swelling or induration. 1cm scrape on medial side of left foot with surrounding erythema. Spots of erythema on big toe.) noted. He is not diaphoretic.  Psychiatric: Memory, affect and judgment normal.     Lab results: Basic Metabolic Panel:  Recent Labs  67/12/45 1123 02/22/13 1636  NA 140  --   K 5.3  --   CL 104  --   CO2 24  --   GLUCOSE 93  --   BUN 24*  --   CREATININE 1.34  --   CALCIUM 9.0  --   MG  --  2.1   Liver Function Tests:  Recent Labs  02/22/13 1123  AST 17  ALT 7  ALKPHOS 83  BILITOT 1.0  PROT 6.6  ALBUMIN 3.5   No results found for this basename: LIPASE, AMYLASE,  in the last 72 hours No results found for this basename: AMMONIA,  in the last 72 hours CBC:  Recent Labs  02/22/13 1123  WBC 9.0  HGB 14.2  HCT 41.2  MCV 98.6  PLT 186   Cardiac Enzymes:  Recent Labs  02/22/13 1608  TROPONINI <0.30   BNP:  Recent Labs  02/22/13 1123  PROBNP 6923.0*   D-Dimer: No  results found for this basename: DDIMER,  in the last 72 hours CBG: No results found for this basename: GLUCAP,  in the last 72 hours Hemoglobin A1C: No results found for this basename: HGBA1C,  in the last 72 hours Fasting Lipid Panel: No results found for this basename: CHOL, HDL, LDLCALC, TRIG, CHOLHDL, LDLDIRECT,  in the last 72 hours Thyroid Function Tests: No results found for this basename: TSH, T4TOTAL, FREET4, T3FREE, THYROIDAB,  in the last 72 hours Anemia Panel: No results found for this basename: VITAMINB12, FOLATE, FERRITIN, TIBC, IRON, RETICCTPCT,  in the last 72 hours Coagulation: No results found for this basename: LABPROT, INR,  in the last 72 hours Urine Drug Screen: Drugs of Abuse  No results found for this basename: labopia,  cocainscrnur,  labbenz,  amphetmu,  thcu,  labbarb    Alcohol Level: No results found for this basename: ETH,  in the last 72 hours Urinalysis: No results found for this basename: COLORURINE, APPERANCEUR, LABSPEC, PHURINE, GLUCOSEU, HGBUR, BILIRUBINUR, KETONESUR, PROTEINUR, UROBILINOGEN, NITRITE, LEUKOCYTESUR,  in the last 72 hours   Imaging results:  Dg Chest 2 View  02/22/2013   CLINICAL DATA:  Difficulty breathing.  Weakness.  EXAM: CHEST  2 VIEW  COMPARISON:  Chest x-ray 02/03/2013.  FINDINGS: Patchy ill-defined airspace opacities throughout the right lung, and the left lung base concerning for potential multilobar bronchopneumonia. Relative sparing of the left upper lobe. Small bilateral pleural effusions. Pulmonary vasculature is within normal limits. Heart size appears mildly enlarged. The patient is rotated  to the right on today's exam, resulting in distortion of the mediastinal contours and reduced diagnostic sensitivity and specificity for mediastinal pathology. Atherosclerosis in the thoracic aorta. Left-sided pacemaker device in place with lead tips projecting over the expected location of the right atrium and right ventricular free wall.   IMPRESSION: 1. Interval development of patchy multifocal airspace disease in the left lower lobe and throughout the right lung, concerning for developing multilobar bronchopneumonia. 2. Small bilateral pleural effusions. 3. Mild cardiomegaly. 4. Atherosclerosis.   Electronically Signed   By: Trudie Reed M.D.   On: 02/22/2013 13:12    Other results: EKG: AV paced rhythm. New TWI in inferior leads and V3 compared to study dated 12/23/11.  Assessment & Plan by Problem: Connor Diaz is a 80 y.o. male with PMH HTN, multivessel coronary artery disease s/p LHC and multivessel stenting (left circumflex, LAD, and obtuse marginal 3) in 12/2011 by Dr. 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  #Community acquired pneumonia - Patient presents with worsening SOB. Chest x-ray shows patchy multifocal airspace disease in the left lower lobe and throughout the right lung, concerning for developing multilobar bronchopneumonia. He denies cough, fevers, chills, rhinorrhea, sore throat. Lung exam notable for anterior wheezes and some soft crackles at the bases. He is afebrile, not tachycardic, satting well on room air. - Admit to IMTS, telemetry - Azithromycin 500mg  IV daily and ceftriaxone 1g IV daily for treatment of community acquired pneumonia - Albuterol nebs q6h prn shortness of breath - Flu panel - Droplet precautions - Respiratory virus panel - Blood cultures x2 - HIV - Urine legionella, strep pneumo antigen - Continuous pulse oximetry - BMP, CBC in am  #Acute systolic heart failure - 2D echo in 11/2011 showed normal systolic function, EF 50-55%. 2D echo interpreted by his PCP this morning showed EF of 25-30%. Pro-BNP is elevated at 6923. No prior values for comparison. He  is s/p 20mg  IV Lasix in the ED. I spoke with Dr. [name] who then saw the patient in the ED. He noted that the patient may need a nuclear stress test once fully compensated and pneumonia resolved to evaluate for ischemia. He also suggests we do an EP consult (Dr. Johney Frame, [facility]) for pacemaker interrogation tomorrow. Patient may need chronic anticoagulation if noted to have chronic atrial fibrillation. - Appreciate cardiology recs, Dr. [name] - Repeat 2D echo - Lasix 40mg  po daily starting tomorrow - Coreg 3.125 mg BID - Monitor renal function closely, will add ACE inhibitor as renal function and blood pressure permits - Daily weights - Strict ins and outs  #EKG abnormality, with history of multivessel CAD - Patient has a history of multivessel coronary artery disease s/p LHC and multivessel stenting in 2013. EKG shows new TWI in inferior leads and V3. - SL NG 0.4mg  q7min prn chest pain - Cycle troponins - Repeat EKG in am - Continue home Plavix 75mg  daily  #LLE cellulitis - Patient is supposedly s/p 14 days of Keflex. Still with erythema of calf, foot. - Wound care consult - Bactroban cream BID  #Hypertension - Currently well controlled. - Change home Lopressor 25 mg BID to Coreg 3.125 mg BID per Dr. [name] - Continue home olmesartan-HCTZ 40-12.5 mg daily  #Paroxysmal atrial fibrillation - Patient is in a paced rhythm currently. - Continue home amiodarone 100mg  daily  #  Hypothyroidism - Stable. - Continue home synthroid daily. - Checking TSH  #HLD - Continue statin Lipitor 40mg  daily and Lovaza 1g daily.  #GERD - Famotidine 20mg  BID.  #BPH - Continue home finasteride 5mg  daily.  #Asymptomatic AAA - Patient last saw [facility] in the clinic on 03/07/12. Duplex ultrasound at that time showed a maximum diameter of 4.81 AP by 4.84 transverse, slight increase from January 2012 when he was 4.4 x 4.5. Repeat duplex on 09/07/12 showed no significant change.  #DVT PPX - Lovenox  subq and SCDs  #Code status - Patient would like to be DNI, but he *would* like chest compressions/defibrillation. Partial code status updated in EPIC.  Dispo: Disposition is deferred at this time, awaiting improvement of current medical problems. Anticipated discharge in approximately 1-3 day(s).   The patient does have a current PCP (Richard Leotis Shames, MD) and does need an [facility] hospital follow-up appointment after discharge.  The patient does not have transportation limitations that hinder transportation to clinic appointments.  Signed: Vivi Barrack, MD 02/22/2013, 6:15 PM

## 2013-02-22 NOTE — ED Provider Notes (Signed)
CSN: [id number]     Arrival date & time 02/22/13  1113 History   First MD Initiated Contact with Patient 02/22/13 1131     Chief Complaint  Patient presents with  . Shortness of Breath   (Consider location/radiation/quality/duration/timing/severity/associated sxs/prior Treatment) HPI History of coronary artery disease, pacemaker, atrial fibrillation, chronic kidney disease, no history of heart failure a gradual onset 2-3 weeks of exertional dyspnea now has resting dyspnea the last few days gradually worsening; no edema no chest pain no fever no cough; echocardiogram this morning revealed ejection fraction 25-30%; a chest x-ray a few weeks ago that showed mild pulmonary edema not treated PTA.   Past Medical History  Diagnosis Date  . Hypertension   . AAA (abdominal aortic aneurysm)     a. 02/2009 U/S 4.4 x 4.5 cm  . PAF (paroxysmal atrial fibrillation)     a. noted 11/2011  . Symptomatic bradycardia     a. noted 11/2011  . History of tobacco abuse     a. roughly 36 pack years, quit @ age 80.  Marland Kitchen Pancreatitis     a. 10/2011 - managed conservatively @ home.  Marland Kitchen BPH (benign prostatic hyperplasia)   . Coronary artery disease   . Myocardial infarction     "  SILENT "  . Anginal pain   . Hypothyroidism   . Shortness of breath   . Pacemaker 11/2011  . HCAP (healthcare-associated pneumonia) 11/2011  . Arthritis     "knees" (02/22/2013)  . DJD (degenerative joint disease)   . CKD (chronic kidney disease), stage III    Past Surgical History  Procedure Laterality Date  . Coronary angioplasty with stent placement  12/22/2011     LAD & CIRCUMFLEX  . Insert / replace / remove pacemaker  11/20/2011    Implanted by [name]   . Tonsillectomy  ~ 1933  . Cataract extraction w/ intraocular lens  implant, bilateral Bilateral 1980's   Family History  Problem Relation Age of Onset  . Other Father     Accidental death @ age 62 - box fell on him at work  . Cancer Mother     died @ 18 Bladder  cancer   History  Substance Use Topics  . Smoking status: Former Smoker -- 1.00 packs/day for 35 years    Types: Cigarettes    Quit date: 12/17/1980  . Smokeless tobacco: Never Used     Comment: Quit at age 80.  Marland Kitchen Alcohol Use: No    Review of Systems 10 Systems reviewed and are negative for acute change except as noted in the HPI. Allergies  Review of patient's allergies indicates no known allergies.  Home Medications   No current outpatient prescriptions on file. BP 140/65  Pulse 72  Temp(Src) 97.4 F (36.3 C) (Oral)  Resp 20  SpO2 98% Physical Exam  Nursing note and vitals reviewed. Constitutional:  Awake, alert, nontoxic appearance.  HENT:  Head: Atraumatic.  Eyes: Right eye exhibits no discharge. Left eye exhibits no discharge.  Neck: Neck supple.  Cardiovascular: Normal rate and regular rhythm.   No murmur heard. Pulmonary/Chest: He is in respiratory distress. He has no wheezes. He has rales. He exhibits no tenderness.   has bibasilar crackles; pulse oximetry normal on room air 94% at rest  Abdominal: Soft. There is no tenderness. There is no rebound.  Musculoskeletal: He exhibits no edema and no tenderness.  Baseline ROM, no obvious new focal weakness.  Neurological: He is alert.  Mental  status and motor strength appears baseline for patient and situation.  Skin: No rash noted.  Psychiatric: He has a normal mood and affect.    ED Course  Procedures (including critical care time) Flow Mgr checked with [facility], told me Pt Unassigned, Int Med paged state Pt should be assigned to [facility],  [facility] paged. Int Med called back and found out from [facility] Pt unassigned after all so Int Med to see Pt in ED for admit. 1550 Patient / Family / Caregiver informed of clinical course, understand medical decision-making process, and agree with plan.Pt stable in ED with no significant deterioration in condition. Labs Review Labs Reviewed  CBC - Abnormal; Notable for the following:     RBC 4.18 (*)    All other components within normal limits  PRO B NATRIURETIC PEPTIDE - Abnormal; Notable for the following:    Pro B Natriuretic peptide (BNP) 6923.0 (*)    All other components within normal limits  COMPREHENSIVE METABOLIC PANEL - Abnormal; Notable for the following:    BUN 24 (*)    GFR calc non Af Amer 46 (*)    GFR calc Af Amer 53 (*)    All other components within normal limits  RESPIRATORY VIRUS PANEL  CULTURE, BLOOD (ROUTINE X 2)  CULTURE, BLOOD (ROUTINE X 2)  CULTURE, EXPECTORATED SPUTUM-ASSESSMENT  GRAM STAIN  INFLUENZA PANEL BY PCR (TYPE A & B, H1N1)  TROPONIN I  TROPONIN I  STREP PNEUMONIAE URINARY ANTIGEN  MAGNESIUM  TROPONIN I  HIV ANTIBODY (ROUTINE TESTING)  LEGIONELLA ANTIGEN, URINE  BASIC METABOLIC PANEL  CBC WITH DIFFERENTIAL  TSH  POCT I-STAT TROPONIN I  CG4 I-STAT (LACTIC ACID)   Imaging Review Dg Chest 2 View  02/22/2013   CLINICAL DATA:  Difficulty breathing.  Weakness.  EXAM: CHEST  2 VIEW  COMPARISON:  Chest x-ray 02/03/2013.  FINDINGS: Patchy ill-defined airspace opacities throughout the right lung, and the left lung base concerning for potential multilobar bronchopneumonia. Relative sparing of the left upper lobe. Small bilateral pleural effusions. Pulmonary vasculature is within normal limits. Heart size appears mildly enlarged. The patient is rotated to the right on today's exam, resulting in distortion of the mediastinal contours and reduced diagnostic sensitivity and specificity for mediastinal pathology. Atherosclerosis in the thoracic aorta. Left-sided pacemaker device in place with lead tips projecting over the expected location of the right atrium and right ventricular free wall.  IMPRESSION: 1. Interval development of patchy multifocal airspace disease in the left lower lobe and throughout the right lung, concerning for developing multilobar bronchopneumonia. 2. Small bilateral pleural effusions. 3. Mild cardiomegaly. 4.  Atherosclerosis.   Electronically Signed   By: Trudie Reedaniel  Entrikin M.D.   On: 02/22/2013 13:12    EKG Interpretation    Date/Time:  Wednesday February 22 2013 11:18:50 EST Ventricular Rate:  66 PR Interval:  260 QRS Duration: 108 QT Interval:  464 QTC Calculation: 486 R Axis:   -12 Text Interpretation:  AV dual-paced rhythm with prolonged AV conduction with frequent atrial-paced complexes No significant change since last tracing Confirmed by [facility]  MD, Nycholas Rayner ([id number]) on 02/22/2013 11:47:07 AM            MDM   1. Heart failure   2. CAP (community acquired pneumonia)    The patient appears reasonably stabilized for admission considering the current resources, flow, and capabilities available in the ED at this time, and I doubt any other [facility] requiring further screening and/or treatment in the ED prior to admission.  Hurman Horn, MD 02/22/13 [phone]

## 2013-02-22 NOTE — ED Notes (Signed)
Pt has been increased sob over the last 2 weeks and had echo done today that showed EF 25-30%, Pt has significant multivessel cad.  Pt has pacemaker.  Sob at rest.  Pt denies any swelling

## 2013-02-22 NOTE — ED Notes (Signed)
NOTIFIED DR. Fonnie Jarvis IN PERSON OF PATIENTS LAB RESULTS OF CG4 LACTIC ACID ,@ 15:40 PM , 02/22/2013.

## 2013-02-23 DIAGNOSIS — K219 Gastro-esophageal reflux disease without esophagitis: Secondary | ICD-10-CM

## 2013-02-23 DIAGNOSIS — I2510 Atherosclerotic heart disease of native coronary artery without angina pectoris: Secondary | ICD-10-CM

## 2013-02-23 DIAGNOSIS — Z950 Presence of cardiac pacemaker: Secondary | ICD-10-CM

## 2013-02-23 DIAGNOSIS — I4891 Unspecified atrial fibrillation: Secondary | ICD-10-CM

## 2013-02-23 DIAGNOSIS — I5021 Acute systolic (congestive) heart failure: Secondary | ICD-10-CM

## 2013-02-23 DIAGNOSIS — I10 Essential (primary) hypertension: Secondary | ICD-10-CM

## 2013-02-23 DIAGNOSIS — N400 Enlarged prostate without lower urinary tract symptoms: Secondary | ICD-10-CM

## 2013-02-23 DIAGNOSIS — E039 Hypothyroidism, unspecified: Secondary | ICD-10-CM

## 2013-02-23 DIAGNOSIS — J189 Pneumonia, unspecified organism: Secondary | ICD-10-CM

## 2013-02-23 DIAGNOSIS — I509 Heart failure, unspecified: Secondary | ICD-10-CM

## 2013-02-23 DIAGNOSIS — E785 Hyperlipidemia, unspecified: Secondary | ICD-10-CM

## 2013-02-23 DIAGNOSIS — L03119 Cellulitis of unspecified part of limb: Secondary | ICD-10-CM

## 2013-02-23 DIAGNOSIS — I359 Nonrheumatic aortic valve disorder, unspecified: Secondary | ICD-10-CM

## 2013-02-23 DIAGNOSIS — L02419 Cutaneous abscess of limb, unspecified: Secondary | ICD-10-CM

## 2013-02-23 LAB — LEGIONELLA ANTIGEN, URINE: Legionella Antigen, Urine: NEGATIVE

## 2013-02-23 LAB — CBC WITH DIFFERENTIAL/PLATELET
Basophils Absolute: 0 10*3/uL (ref 0.0–0.1)
Basophils Relative: 0 % (ref 0–1)
EOS ABS: 0.7 10*3/uL (ref 0.0–0.7)
Eosinophils Relative: 8 % — ABNORMAL HIGH (ref 0–5)
HCT: 37.3 % — ABNORMAL LOW (ref 39.0–52.0)
Hemoglobin: 12.8 g/dL — ABNORMAL LOW (ref 13.0–17.0)
LYMPHS ABS: 1.8 10*3/uL (ref 0.7–4.0)
Lymphocytes Relative: 22 % (ref 12–46)
MCH: 34.1 pg — AB (ref 26.0–34.0)
MCHC: 34.3 g/dL (ref 30.0–36.0)
MCV: 99.5 fL (ref 78.0–100.0)
Monocytes Absolute: 1.5 10*3/uL — ABNORMAL HIGH (ref 0.1–1.0)
Monocytes Relative: 19 % — ABNORMAL HIGH (ref 3–12)
NEUTROS PCT: 51 % (ref 43–77)
Neutro Abs: 4.2 10*3/uL (ref 1.7–7.7)
Platelets: 169 10*3/uL (ref 150–400)
RBC: 3.75 MIL/uL — AB (ref 4.22–5.81)
RDW: 14.9 % (ref 11.5–15.5)
WBC: 8.2 10*3/uL (ref 4.0–10.5)

## 2013-02-23 LAB — BASIC METABOLIC PANEL
BUN: 25 mg/dL — ABNORMAL HIGH (ref 6–23)
CO2: 25 mEq/L (ref 19–32)
Calcium: 8.8 mg/dL (ref 8.4–10.5)
Chloride: 102 mEq/L (ref 96–112)
Creatinine, Ser: 1.44 mg/dL — ABNORMAL HIGH (ref 0.50–1.35)
GFR calc Af Amer: 49 mL/min — ABNORMAL LOW (ref >90)
GFR, EST NON AFRICAN AMERICAN: 42 mL/min — AB (ref >90)
GLUCOSE: 83 mg/dL (ref 70–99)
Potassium: 5.3 mEq/L (ref 3.7–5.3)
Sodium: 138 mEq/L (ref 137–147)

## 2013-02-23 LAB — TSH: TSH: 8.507 u[IU]/mL — AB (ref 0.350–4.500)

## 2013-02-23 LAB — RESPIRATORY VIRUS PANEL
Adenovirus: NOT DETECTED
INFLUENZA A H3: NOT DETECTED
INFLUENZA A: NOT DETECTED
Influenza A H1: NOT DETECTED
Influenza B: NOT DETECTED
Metapneumovirus: NOT DETECTED
PARAINFLUENZA 1 A: NOT DETECTED
PARAINFLUENZA 2 A: NOT DETECTED
Parainfluenza 3: NOT DETECTED
RESPIRATORY SYNCYTIAL VIRUS B: NOT DETECTED
Respiratory Syncytial Virus A: NOT DETECTED
Rhinovirus: NOT DETECTED

## 2013-02-23 LAB — HIV ANTIBODY (ROUTINE TESTING W REFLEX): HIV: NONREACTIVE

## 2013-02-23 LAB — TROPONIN I: Troponin I: <0.3 ng/mL (ref <0.30)

## 2013-02-23 MED ORDER — FAMOTIDINE 20 MG PO TABS
20.0000 mg | ORAL_TABLET | Freq: Every day | ORAL | Status: DC
  Administered 2013-02-23 – 2013-02-24 (×2): 20 mg via ORAL
  Filled 2013-02-23 (×3): qty 1

## 2013-02-23 MED ORDER — AZITHROMYCIN 500 MG PO TABS
500.0000 mg | ORAL_TABLET | Freq: Every day | ORAL | Status: DC
  Administered 2013-02-23 – 2013-02-25 (×3): 500 mg via ORAL
  Filled 2013-02-23 (×3): qty 1

## 2013-02-23 NOTE — Progress Notes (Signed)
Subjective:  Patient denies any chest pains states breathing has improved. Denies fever or chills Objective:  Vital Signs in the last 24 hours: Temp:  [97.4 F (36.3 C)-98 F (36.7 C)] 98 F (36.7 C) (01/08 0541) Pulse Rate:  [51-72] 64 (01/08 0836) Resp:  [15-27] 18 (01/08 0541) BP: (106-148)/(58-90) 129/67 mmHg (01/08 0836) SpO2:  [91 %-98 %] 94 % (01/08 0836) Weight:  [81.4 kg (179 lb 7.3 oz)-81.8 kg (180 lb 5.4 oz)] 81.4 kg (179 lb 7.3 oz) (01/08 0207)  Intake/Output from previous day: 01/07 0701 - 01/08 0700 In: 240 [P.O.:240] Out: 800 [Urine:800] Intake/Output from this shift: Total I/O In: 480 [P.O.:480] Out: 150 [Urine:150]  Physical Exam: Neck: no adenopathy, no carotid bruit, no JVD and supple, symmetrical, trachea midline Lungs: Decreased breath sound at bases with occasional rhonchi and rales air entry improved Heart: regular rate and rhythm, S1, S2 normal and Soft diastolic murmur and S3 gallop noted Abdomen: soft, non-tender; bowel sounds normal; no masses,  no organomegaly Extremities: extremities normal, atraumatic, no cyanosis or edema  Lab Results:  Recent Labs  02/22/13 1123 02/23/13 0255  WBC 9.0 8.2  HGB 14.2 12.8*  PLT 186 169    Recent Labs  02/22/13 1123 02/23/13 0255  NA 140 138  K 5.3 5.3  CL 104 102  CO2 24 25  GLUCOSE 93 83  BUN 24* 25*  CREATININE 1.34 1.44*    Recent Labs  02/22/13 2125 02/23/13 0255  TROPONINI <0.30 <0.30   Hepatic Function Panel  Recent Labs  02/22/13 1123  PROT 6.6  ALBUMIN 3.5  AST 17  ALT 7  ALKPHOS 83  BILITOT 1.0   No results found for this basename: CHOL,  in the last 72 hours No results found for this basename: PROTIME,  in the last 72 hours  Imaging: Imaging results have been reviewed and Dg Chest 2 View  02/22/2013   CLINICAL DATA:  Difficulty breathing.  Weakness.  EXAM: CHEST  2 VIEW  COMPARISON:  Chest x-ray 02/03/2013.  FINDINGS: Patchy ill-defined airspace opacities throughout  the right lung, and the left lung base concerning for potential multilobar bronchopneumonia. Relative sparing of the left upper lobe. Small bilateral pleural effusions. Pulmonary vasculature is within normal limits. Heart size appears mildly enlarged. The patient is rotated to the right on today's exam, resulting in distortion of the mediastinal contours and reduced diagnostic sensitivity and specificity for mediastinal pathology. Atherosclerosis in the thoracic aorta. Left-sided pacemaker device in place with lead tips projecting over the expected location of the right atrium and right ventricular free wall.  IMPRESSION: 1. Interval development of patchy multifocal airspace disease in the left lower lobe and throughout the right lung, concerning for developing multilobar bronchopneumonia. 2. Small bilateral pleural effusions. 3. Mild cardiomegaly. 4. Atherosclerosis.   Electronically Signed   By: Trudie Reed M.D.   On: 02/22/2013 13:12    Cardiac Studies:  Assessment/Plan:  Resolving Acute decompensated systolic heart failure with markedly depressed LV systolic function which is new as compared to prior echo, rule out ischemia rule MI ruled out Coronary artery disease history of silent MI in the past with occluded RCA  Multivessel CAD status post PCI to LAD left circumflex and OM 3 as above in November 2013  Probable multilobar bilateral pneumonia  Tachybradycardia syndrome status post permanent pacemaker  History of paroxysmal A. fib  Hypertension  Hypercholesteremia  Hypothyroidism  Chronic kidney disease stage II  Degenerative joint disease  Remote tobacco abuse  Abdominal aortic aneurysm Plan Check 2-D echo Consider EP consult for pacemaker interrogation Will schedule for nuclear stress test on Saturday to evaluate for ischemia in view of new congestive heart failure and markedly depressed LV systolic function  LOS: 1 day    Sidney Kann N 02/23/2013, 10:56 AM

## 2013-02-23 NOTE — Progress Notes (Signed)
Patient remains on droplet precautions until respiratory panel comes back per IMTS MD covering overnight, visitors not wearing masks in patient's room.  Educated patient's visitors about droplet precautions and the need for masks and to wash hands or to use hand sanitizer.    Spoke with IMTS MD this morning, Dr. Claudell Kyle discontinued droplet precautions.  Patient and visitors updated.

## 2013-02-23 NOTE — Evaluation (Signed)
Physical Therapy Evaluation Patient Details Name: Connor DareHoward R Swailes MRN: [id number] DOB: 03-22-1923 Today's Date: 02/23/2013 Time: [id number] PT Time Calculation (min): 20 min  PT Assessment / Plan / Recommendation History of Present Illness  Connor DareHoward R Diaz is a 80 y.o. male with PAF, tachy-brady syndrome s/p PPM implant ([employer]) October 2013, CAD s/p PCI to LAD, LCx and OM November 2013, previously normal LVEF by echo 2013 and CKD who was admitted yesterday with worsening dyspnea, found to have acute systolic HF and PNA. Connor Diaz reports DOE with progressive worsening for 3 weeks  Clinical Impression  Pt very pleasant and eager to get rid of supplemental oxygen. Pt saturating 97% on RA at rest in room but with slow continual desaturation with activity as low as 87% with recovery only to 91% on RA with standing rest and pursed lip breathing. On 2L back to 97%. Pt demonstrates decreased activity tolerance, balance and mobility from baseline and will benefit from acute therapy to maximize mobility, activity and safety prior to discharge to increase independence. Pt also educated for need to use cane at all times and will attempt next session with cane to see if mobility and balance improved.     PT Assessment  Patient needs continued PT services    Follow Up Recommendations  Outpatient PT    Does the patient have the potential to tolerate intense rehabilitation      Barriers to Discharge        Equipment Recommendations  None recommended by PT    Recommendations for Other Services     Frequency Min 3X/week    Precautions / Restrictions Precautions Precautions: Fall   Pertinent Vitals/Pain Right knee arthritic pain  chronic     Mobility  Bed Mobility Overal bed mobility: Modified Independent Transfers Overall transfer level: Modified independent Ambulation/Gait Ambulation/Gait assistance: Min guard Ambulation Distance (Feet): 200 Feet Assistive device: None;1  person hand held assist Gait Pattern/deviations: Step-through pattern;Decreased stride length;Drifts right/left;Narrow base of support Gait velocity interpretation: Below normal speed for age/gender General Gait Details: pt initially requiring holding PT shoudler to ambulate but was then able to ambulate without assist but with unsteady and slow gait. Cueing throughout for pursed lip breathing due to oxygen sats dropping to 88% on RA with gait    Exercises     PT Diagnosis: Difficulty walking  PT Problem List: Decreased activity tolerance;Decreased knowledge of precautions;Decreased mobility;Decreased knowledge of use of DME PT Treatment Interventions: Gait training;DME instruction;Functional mobility training;Balance training;Therapeutic activities;Patient/family education     PT Goals(Current goals can be found in the care plan section) Acute Rehab PT Goals Patient Stated Goal: return to [employer] PT Goal Formulation: With patient/family Time For Goal Achievement: 03/09/13 Potential to Achieve Goals: Good  Visit Information  Last PT Received On: 02/23/13 Assistance Needed: +1 History of Present Illness: Connor Diaz is a 80 y.o. male with PAF, tachy-brady syndrome s/p PPM implant ([employer]) October 2013, CAD s/p PCI to LAD, LCx and OM November 2013, previously normal LVEF by echo 2013 and CKD who was admitted yesterday with worsening dyspnea, found to have acute systolic HF and PNA. Connor Diaz reports DOE with progressive worsening for 3 weeks       Prior Functioning  Home Living Family/patient expects to be discharged to:: Private residence Living Arrangements: Spouse/significant other Available Help at Discharge: Family;Available 24 hours/day Type of Home: House Home Access: Stairs to enter [employer] CorporationEntrance Stairs-Number of Steps: 1 Entrance Stairs-Rails: None Home Layout: One  level Home Equipment: Walker - 2 wheels;Cane - single point Prior Function Level of  Independence: Independent Communication Communication: No difficulties    Cognition  Cognition Arousal/Alertness: Awake/alert Behavior During Therapy: WFL for tasks assessed/performed Overall Cognitive Status: Within Functional Limits for tasks assessed    Extremity/Trunk Assessment Upper Extremity Assessment Upper Extremity Assessment: Overall WFL for tasks assessed Lower Extremity Assessment Lower Extremity Assessment: Overall WFL for tasks assessed Cervical / Trunk Assessment Cervical / Trunk Assessment: Normal   Balance    End of Session PT - End of Session Equipment Utilized During Treatment: Gait belt Activity Tolerance: Patient tolerated treatment well Patient left: in chair;with call bell/phone within reach Nurse Communication: Mobility status;Other (comment) (desaturation with activity)  GP     Toney Sang Beth 02/23/2013, 2:44 PM Delaney Meigs, PT [phone]

## 2013-02-23 NOTE — Progress Notes (Signed)
Subjective: Patient seen and examined at the bedside this morning. He feels better this morning. His shortness of breath has improved. He denies cough, fevers, chills, chest pain, N/V/D, leg swelling, abdominal pain.  Objective: Vital signs in last 24 hours: Filed Vitals:   02/23/13 0207 02/23/13 0541 02/23/13 0836 02/23/13 1116  BP:  111/65 129/67 128/62  Pulse:  62 64 63  Temp:  98 F (36.7 C)    TempSrc:  Oral    Resp:  18    Height:      Weight: 179 lb 7.3 oz (81.4 kg)     SpO2:  98% 94% 96%   Weight change:   Intake/Output Summary (Last 24 hours) at 02/23/13 1222 Last data filed at 02/23/13 1118  Gross per 24 hour  Intake    840 ml  Output    950 ml  Net   -110 ml   Physical Exam  Constitutional: He is oriented to person, place, and time and well-developed, well-nourished, and in no distress.  HENT:  Head: Normocephalic and atraumatic.  Eyes: Conjunctivae and EOM are normal. Pupils are equal, round, and reactive to light.  Neck: Normal range of motion. Neck supple. No tracheal deviation present.  Cardiovascular: Normal rate and regular rhythm. Exam reveals no gallop and no friction rub.  No murmur heard. Difficult auscultation. In paced NSR on monitor.  Pulmonary/Chest: No respiratory distress. He has rhonchi (Soft crackles in left lung base.). He has no rales. He exhibits no tenderness.  Abdominal: Soft. Bowel sounds are normal. He exhibits no distension and no mass. There is no tenderness. There is no rebound and no guarding.  No ascites.  Musculoskeletal: Normal range of motion. He exhibits no edema and no tenderness.  No LE edema.  Neurological: He is alert and oriented to person, place, and time. No cranial nerve deficit. GCS score is 15.  Skin: Skin is warm and dry. Rash (Erythema of the LLE, no swelling or induration. 1cm scrape on medial side of left foot with surrounding erythema. Spots of erythema on big toe.) noted. He is not diaphoretic.  Psychiatric:  Memory, affect and judgment normal.   Lab Results: Basic Metabolic Panel:  Recent Labs Lab 02/22/13 1123 02/22/13 1636 02/23/13 0255  NA 140  --  138  K 5.3  --  5.3  CL 104  --  102  CO2 24  --  25  GLUCOSE 93  --  83  BUN 24*  --  25*  CREATININE 1.34  --  1.44*  CALCIUM 9.0  --  8.8  MG  --  2.1  --    Liver Function Tests:  Recent Labs Lab 02/22/13 1123  AST 17  ALT 7  ALKPHOS 83  BILITOT 1.0  PROT 6.6  ALBUMIN 3.5   No results found for this basename: LIPASE, AMYLASE,  in the last 168 hours No results found for this basename: AMMONIA,  in the last 168 hours CBC:  Recent Labs Lab 02/22/13 1123 02/23/13 0255  WBC 9.0 8.2  NEUTROABS  --  4.2  HGB 14.2 12.8*  HCT 41.2 37.3*  MCV 98.6 99.5  PLT 186 169   Cardiac Enzymes:  Recent Labs Lab 02/22/13 1608 02/22/13 2125 02/23/13 0255  TROPONINI <0.30 <0.30 <0.30   BNP:  Recent Labs Lab 02/22/13 1123  PROBNP 6923.0*   D-Dimer: No results found for this basename: DDIMER,  in the last 168 hours CBG: No results found for this basename: GLUCAP,  in the last 168 hours Hemoglobin A1C: No results found for this basename: HGBA1C,  in the last 168 hours Fasting Lipid Panel: No results found for this basename: CHOL, HDL, LDLCALC, TRIG, CHOLHDL, LDLDIRECT,  in the last 168 hours Thyroid Function Tests: No results found for this basename: TSH, T4TOTAL, FREET4, T3FREE, THYROIDAB,  in the last 168 hours Coagulation: No results found for this basename: LABPROT, INR,  in the last 168 hours Anemia Panel: No results found for this basename: VITAMINB12, FOLATE, FERRITIN, TIBC, IRON, RETICCTPCT,  in the last 168 hours Urine Drug Screen: Drugs of Abuse  No results found for this basename: labopia,  cocainscrnur,  labbenz,  amphetmu,  thcu,  labbarb    Alcohol Level: No results found for this basename: ETH,  in the last 168 hours Urinalysis: No results found for this basename: COLORURINE, APPERANCEUR, LABSPEC,  PHURINE, GLUCOSEU, HGBUR, BILIRUBINUR, KETONESUR, PROTEINUR, UROBILINOGEN, NITRITE, LEUKOCYTESUR,  in the last 168 hours Misc. Labs:   Micro Results: No results found for this or any previous visit (from the past 240 hour(s)). Studies/Results: Dg Chest 2 View  02/22/2013   CLINICAL DATA:  Difficulty breathing.  Weakness.  EXAM: CHEST  2 VIEW  COMPARISON:  Chest x-ray 02/03/2013.  FINDINGS: Patchy ill-defined airspace opacities throughout the right lung, and the left lung base concerning for potential multilobar bronchopneumonia. Relative sparing of the left upper lobe. Small bilateral pleural effusions. Pulmonary vasculature is within normal limits. Heart size appears mildly enlarged. The patient is rotated to the right on today's exam, resulting in distortion of the mediastinal contours and reduced diagnostic sensitivity and specificity for mediastinal pathology. Atherosclerosis in the thoracic aorta. Left-sided pacemaker device in place with lead tips projecting over the expected location of the right atrium and right ventricular free wall.  IMPRESSION: 1. Interval development of patchy multifocal airspace disease in the left lower lobe and throughout the right lung, concerning for developing multilobar bronchopneumonia. 2. Small bilateral pleural effusions. 3. Mild cardiomegaly. 4. Atherosclerosis.   Electronically Signed   By: Trudie Reed M.D.   On: 02/22/2013 13:12   Medications: I have reviewed the patient's current medications. Scheduled Meds: . amiodarone  100 mg Oral Daily  . atorvastatin  40 mg Oral q1800  . azithromycin  500 mg Oral Daily  . carvedilol  3.125 mg Oral BID WC  . cefTRIAXone (ROCEPHIN)  IV  1 g Intravenous Q24H  . clopidogrel  75 mg Oral Q breakfast  . enoxaparin (LOVENOX) injection  40 mg Subcutaneous Q24H  . famotidine  20 mg Oral QHS  . finasteride  5 mg Oral Daily  . furosemide  40 mg Oral Daily  . levothyroxine  50 mcg Oral QAC breakfast  . mupirocin cream    Topical BID  . omega-3 acid ethyl esters  1 g Oral Daily  . polyethylene glycol  17 g Oral Daily   Continuous Infusions:  PRN Meds:.albuterol, nitroGLYCERIN, sodium chloride Assessment/Plan: Connor Diaz is a 80 y.o. male with PMH HTN, multivessel coronary artery disease s/p LHC and multivessel stenting (left circumflex, LAD, and obtuse marginal 3) in 12/2011 by Dr. 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   #Community acquired pneumonia - Patient presents with worsening SOB. Chest x-ray shows patchy multifocal airspace disease in the left lower lobe and throughout the right lung, concerning for  developing multilobar bronchopneumonia. He remains afebrile, not tachycardic, satting well on room air. Flu panel was negative. HIV NR. Strep pneumo urinary antigen negative. - Azithromycin 500mg  IV daily - Ceftriaxone 1g IV daily - Albuterol nebs q6h prn shortness of breath  - Blood cultures x2 > In process - Follow up urine legionella antigen  - Continuous pulse oximetry  - BMP, CBC in am   #Acute systolic heart failure - 2D echo interpreted by his PCP 02/22/13 showed EF of 25-30%. Pro-BNP elevated at 6923. 2D echo in 11/2011 showed normal systolic function, EF 50-55%. - Appreciate cardiology recs, Dr. Sharyn LullHarwani  - EP consult (Dr. Johney FrameAllred, [facility]) for pacemaker interrogation as patient may need chronic anticoagulation if noted to have chronic atrial fibrillation - Nuclear stress test on Saturday to evaluate for ischemia in view of new congestive heart failure and markedly depressed LV systolic function, per Dr. Sharyn LullHarwani - Repeat 2D echo  - Lasix 40mg  po daily - Coreg 3.125 mg BID  - Monitor renal function closely, will add ACE/ARB as renal function and blood pressure  permits  - Daily weights  - Strict ins and outs > Net -560cc yesterday  Filed Weights   02/22/13 2011 02/23/13 0207  Weight: 180 lb 5.4 oz (81.8 kg) 179 lb 7.3 oz (81.4 kg)    #EKG abnormality, with history of multivessel CAD - Patient has a history of multivessel coronary artery disease s/p LHC and multivessel stenting in 2013. Admission EKG showed new TWI in inferior leads and V3. Troponins were cycled and negative x3. Repeat EKG with improvement in inferior TWI, but new lateral TWI. Difficult to interpret significance given paced rhythm.  - EP consult as above - SL NG 0.4mg  q385min prn chest pain  - Continue home Plavix 75mg  daily   #LLE cellulitis - Patient is supposedly s/p 14 days of Keflex. Still with erythema of calf, foot. Wound care saw him, no treatment recommended. - Appreciate wound care recs - Bactroban cream BID   #Hypertension - Currently well controlled.  - Change home Lopressor 25 mg BID to Coreg 3.125 mg BID per Dr. Sharyn LullHarwani  - Holding home olmesartan-HCTZ 40-12.5 mg daily, will consider adding back tomorrow as renal function and blood pressure permits   #Paroxysmal atrial fibrillation - Patient is in a paced rhythm currently.  - Continue home amiodarone 100mg  daily  - EP consult as above  #Hypothyroidism - Stable.  - Continue home synthroid 50mcg daily.  - Checking TSH   #HLD - Continue Lipitor 40mg  daily and Lovaza 1g daily.   #GERD - Reducing Famotidine to 20mg  daily which is renal dosing.   #BPH - Continue home finasteride 5mg  daily.   #Asymptomatic AAA - Patient last saw [facility] in the clinic on 03/07/12. Duplex ultrasound at that time showed a maximum diameter of 4.81 AP by 4.84 transverse, slight increase from January 2012 when he was 4.4 x 4.5. Repeat duplex on 09/07/12 showed no significant change.   #DVT PPX - Lovenox subq and SCDs   #Code status - Patient would like to be DNI, but he *would* like chest compressions/defibrillation. Partial code status  updated in EPIC.   Dispo: Disposition is deferred at this time, awaiting improvement of current medical problems.  Anticipated discharge in approximately 1-3 day(s).   The patient does have a current PCP (Richard Leotis ShamesM Dondiego, MD) and [facility] after [facility].  The patient does not have transportation limitations that hinder transportation to clinic appointments.  .Marland Kitchen  Services Needed at time of discharge: Y = Yes, Blank = No PT:   OT:   RN:   Equipment:   Other:     LOS: 1 day   Vivi Barrack, MD 02/23/2013, 12:22 PM

## 2013-02-23 NOTE — Progress Notes (Signed)
Patient ambulated with PT, oxygen saturation dropped to 89% on room air during ambulation.  Reapplied 2L via nasal cannula in room and patient's oxygen saturation rose to 97%.  Patient resting comfortably in room, wife at bedside.  Patient states he has no questions or concerns at this time.  Will continue to monitor.

## 2013-02-23 NOTE — H&P (Signed)
INTERNAL MEDICINE TEACHING SERVICE Attending Admission Note  Date: 02/23/2013  Patient name: Connor Diaz  Medical record number: [id number]  Date of birth: [birth year]    I have seen and evaluated [name] and discussed their care with the Residency Team.  80 yr old male with HTN, P-Afib, CKD 3, AAA, CAD with stents, s/p PPM due to symptomatic bradycardia/tachy brady, presented with SOB. Patient reported DOE that was worsening. He follows with Dr. [name] as outpatient. He is currently on amiodarone therapy which was recently decreased to 100 mg daily. He was treated with IV Lasix, NTG, and Azithro/Rocephin was added due to findings of multifocal airspace disease on CXR. Of note, he denies cough, chills, and he has no leukocytosis or fever. Given comorbidities and increasing SOB, would go ahead and treat as CAP. I agree he has component of acute systolic heart failure. Ruled out for ACS. Continue Lasix. Cards will schedule MPS.   Jonah Blue, DO, FACP Faculty [facility] Internal Medicine Residency Program 02/23/2013, 1:30 PM

## 2013-02-23 NOTE — Progress Notes (Signed)
Echocardiogram 2D Echocardiogram has been performed.  Connor Diaz 02/23/2013, 12:39 PM

## 2013-02-23 NOTE — Consult Note (Signed)
WOC wound consult note Reason for Consult: Per consult note-left LE wound.  At the time of my assessment patient and wife report that he did have some issues with his LLE and was treated with PO antibiotics for "cellulitis".  He does have some mild discoloration of the LLE and some evidence of some healed areas on the medial arch of the foot. No open wounds at this time. No treatment recommended.  Discussed POC with patient and bedside nurse.  Re consult if needed, will not follow at this time. Thanks  Julyanna Scholle [employer], CWOCN [phone])

## 2013-02-23 NOTE — Consult Note (Signed)
ELECTROPHYSIOLOGY CONSULT NOTE  Patient ID: Connor Diaz MRN: [id number], DOB/AGE: [birth year]   Admit date: 02/22/2013 Date of Consult: 02/23/2013  Primary Physician: [name], MD Primary Cardiologist: [name], MD Reason for Consultation: Atrial fibrillation, tachy-brady s/p PPM implant, now with new LV dysfunction  History of Present Illness Connor Diaz is a 80 y.o. male with PAF, tachy-brady syndrome s/p PPM implant ([employer]) October 2013, CAD s/p PCI to LAD, LCx and OM November 2013, previously normal LVEF by echo 2013 and CKD who was admitted yesterday with worsening dyspnea, found to have acute systolic HF and PNA. [name] reports DOE with progressive worsening for 3 weeks. He also reports PND. He denies CP. He denies palpitations or syncope. He denies LE or abdominal swelling. He was evaluated by his PCP 02/03/2013 and a chest x-ray at that time showed "mild CHF." He was evaluated at his PCP's office yesterday at which time an echo was done revealing new LV dysfunction, EF 25-30%, diffuse HK and AI. An echo was repeated today but the report is pending. He has been continued on ARB therapy. Metoprolol tartrate has been discontinued in favor of Coreg.    Past Medical History Past Medical History  Diagnosis Date  . Hypertension   . AAA (abdominal aortic aneurysm)     a. 02/2009 U/S 4.4 x 4.5 cm  . PAF (paroxysmal atrial fibrillation)     a. noted 11/2011  . Symptomatic bradycardia     a. noted 11/2011  . History of tobacco abuse     a. roughly 36 pack years, quit @ age 29.  Marland Kitchen Pancreatitis     a. 10/2011 - managed conservatively @ home.  Marland Kitchen BPH (benign prostatic hyperplasia)   . Coronary artery disease   . Myocardial infarction     "  SILENT "  . Anginal pain   . Hypothyroidism   . Shortness of breath   . Pacemaker 11/2011  . HCAP (healthcare-associated pneumonia) 11/2011  . Arthritis     "knees" (02/22/2013)  . DJD (degenerative joint disease)   .  CKD (chronic kidney disease), stage III     Past Surgical History Past Surgical History  Procedure Laterality Date  . Coronary angioplasty with stent placement  12/22/2011     LAD & CIRCUMFLEX  . Insert / replace / remove pacemaker  11/20/2011    Implanted by [name]   . Tonsillectomy  ~ 1933  . Cataract extraction w/ intraocular lens  implant, bilateral Bilateral 1980's    Allergies/Intolerances No Known Allergies   Current Home Medications      amiodarone 200 MG tablet  Commonly known as:  PACERONE  Take 0.5 tablets (100 mg total) by mouth daily.     atorvastatin 80 MG tablet  Commonly known as:  LIPITOR  Take 40 mg by mouth daily at 6 PM.     BENICAR HCT 40-12.5 MG per tablet  Generic drug:  olmesartan-hydrochlorothiazide  Take 1 tablet by mouth daily as needed (for blood pressure).     clopidogrel 75 MG tablet  Commonly known as:  PLAVIX  Take 75 mg by mouth daily with breakfast.     dutasteride 0.5 MG capsule  Commonly known as:  AVODART  Take 0.5 mg by mouth every morning.     famotidine 20 MG tablet  Commonly known as:  PEPCID  Take 1 tablet (20 mg total) by mouth 2 (two) times daily.     finasteride 5 MG tablet  Commonly known as:  PROSCAR  Take 5 mg by mouth daily.     fish oil-omega-3 fatty acids 1000 MG capsule  Take 1 g by mouth every morning.     levothyroxine 50 MCG tablet  Commonly known as:  SYNTHROID, LEVOTHROID  Take 50 mcg by mouth daily before breakfast.     metoprolol tartrate 25 MG tablet  Commonly known as:  LOPRESSOR  Take 1 tablet (25 mg total) by mouth 2 (two) times daily.     nitroGLYCERIN 0.4 MG SL tablet  Commonly known as:  NITROSTAT  Place 1 tablet (0.4 mg total) under the tongue every 5 (five) minutes x 3 doses as needed for chest pain.     polyethylene glycol powder powder  Commonly known as:  GLYCOLAX/MIRALAX  Take 17 g by mouth daily as needed for moderate constipation.     PROAIR HFA 108 (90 BASE) MCG/ACT inhaler    Generic drug:  albuterol  Inhale 2 puffs into the lungs every 6 (six) hours as needed for shortness of breath.     Inpatient Medications . amiodarone  100 mg Oral Daily  . atorvastatin  40 mg Oral q1800  . azithromycin  500 mg Oral Daily  . carvedilol  3.125 mg Oral BID WC  . cefTRIAXone (ROCEPHIN)  IV  1 g Intravenous Q24H  . clopidogrel  75 mg Oral Q breakfast  . enoxaparin (LOVENOX) injection  40 mg Subcutaneous Q24H  . famotidine  20 mg Oral QHS  . finasteride  5 mg Oral Daily  . furosemide  40 mg Oral Daily  . levothyroxine  50 mcg Oral QAC breakfast  . mupirocin cream   Topical BID  . omega-3 acid ethyl esters  1 g Oral Daily  . polyethylene glycol  17 g Oral Daily    Family History Family History  Problem Relation Age of Onset  . Other Father     Accidental death @ age 3 - box fell on him at work  . Cancer Mother     died @ 20 Bladder cancer     Social History History   Social History  . Marital Status: Married    Spouse Name: N/A    Number of Children: N/A  . Years of Education: N/A   Occupational History  . Not on file.   Social History Main Topics  . Smoking status: Former Smoker -- 1.00 packs/day for 35 years    Types: Cigarettes    Quit date: 12/17/1980  . Smokeless tobacco: Never Used     Comment: Quit at age 55.  Marland Kitchen Alcohol Use: No  . Drug Use: No  . Sexual Activity: Not Currently   Other Topics Concern  . Not on file   Social History Narrative   Pt lives in [place] with his wife.  He is retired from [employer].  He is relatively active @ home.     Review of Systems General: No chills, fever, night sweats or weight changes  Cardiovascular:  No chest pain, dyspnea on exertion, edema, orthopnea, palpitations, paroxysmal nocturnal dyspnea Dermatological: No rash, lesions or masses Respiratory: No cough, dyspnea Urologic: No hematuria, dysuria Abdominal: No nausea, vomiting, diarrhea, bright red blood per rectum, melena, or  hematemesis Neurologic: No visual changes, weakness, changes in mental status All other systems reviewed and are otherwise negative except as noted above.  Physical Exam Vitals: Blood pressure 128/62, pulse 63, temperature 98 F (36.7 C), temperature source Oral, resp. rate 18, height  5\' 11"  (1.803 m), weight 179 lb 7.3 oz (81.4 kg), SpO2 96.00%.  General: Well developed, well appearing 80 y.o. male in no acute distress. HEENT: Normocephalic, atraumatic. EOMs intact. Sclera nonicteric. Oropharynx clear.  Neck: Supple. No JVD. Lungs: Respirations regular and unlabored. Bilateral end expiratory wheeze. No rales or rhonchi. Heart: RRR. S1, S2 present. No murmurs, rub, S3 or S4. Abdomen: Soft, non-tender, non-distended. BS present x 4 quadrants. No hepatosplenomegaly.  Extremities: No clubbing, cyanosis or edema. DP/PT/Radials 2+ and equal bilaterally. Psych: Normal affect. Neuro: Alert and oriented X 3. Moves all extremities spontaneously. Musculoskeletal: No kyphosis. Skin: Intact. Warm and dry. No rashes or petechiae in exposed areas.   Labs  Recent Labs  02/22/13 1608 02/22/13 2125 02/23/13 0255  TROPONINI <0.30 <0.30 <0.30   Lab Results  Component Value Date   WBC 8.2 02/23/2013   HGB 12.8* 02/23/2013   HCT 37.3* 02/23/2013   MCV 99.5 02/23/2013   PLT 169 02/23/2013    Recent Labs Lab 02/22/13 1123 02/23/13 0255  NA 140 138  K 5.3 5.3  CL 104 102  CO2 24 25  BUN 24* 25*  CREATININE 1.34 1.44*  CALCIUM 9.0 8.8  PROT 6.6  --   BILITOT 1.0  --   ALKPHOS 83  --   ALT 7  --   AST 17  --   GLUCOSE 93 83    Radiology/Studies Dg Chest 2 View  02/22/2013   CLINICAL DATA:  Difficulty breathing.  Weakness.  EXAM: CHEST  2 VIEW  COMPARISON:  Chest x-ray 02/03/2013.  FINDINGS: Patchy ill-defined airspace opacities throughout the right lung, and the left lung base concerning for potential multilobar bronchopneumonia. Relative sparing of the left upper lobe. Small bilateral pleural  effusions. Pulmonary vasculature is within normal limits. Heart size appears mildly enlarged. The patient is rotated to the right on today's exam, resulting in distortion of the mediastinal contours and reduced diagnostic sensitivity and specificity for mediastinal pathology. Atherosclerosis in the thoracic aorta. Left-sided pacemaker device in place with lead tips projecting over the expected location of the right atrium and right ventricular free wall.  IMPRESSION: 1. Interval development of patchy multifocal airspace disease in the left lower lobe and throughout the right lung, concerning for developing multilobar bronchopneumonia. 2. Small bilateral pleural effusions. 3. Mild cardiomegaly. 4. Atherosclerosis.   Electronically Signed   By: Trudie Reedaniel  Entrikin M.D.   On: 02/22/2013 13:12   Dg Chest 2 View  02/03/2013   CLINICAL DATA:  Chest congestion.  EXAM: CHEST  2 VIEW  COMPARISON:  03/23/2012.  FINDINGS: Cardiomegaly with normal pulmonary vascularity noted. Cardiac pacer noted with lead tips in right atrium right ventricle. Mild interstitial prominence with pleural effusions noted. Component of congestive heart failure should be considered. No pneumothorax. No acute osseous abnormality.  IMPRESSION: Findings suggesting mild congestive heart failure with mild interstitial pulmonary edema and small pleural effusions. Cardiac pacer in good anatomic position.   Electronically Signed   By: Maisie Fushomas  Register   On: 02/03/2013 10:13  Echocardiogram  Pending  12-lead ECG shows AV paced rhythm Telemetry - AV paced at 60 bpm; no arrhythmias Device interrogation performed by industry - Normal PPM function. No interval AFib episodes. VP 100%.  Assessment and Plan 1. Acute systolic HF with new LV dysfunction, EF 25-30% - possible etiologies include coronary ischemia or chronic RV pacing - agree with ischemic w/u as per Dr. Sharyn LullHarwani - agree with initiating medical therapy with carvedilol and continuing ARB -  recommend reassessment of LVEF in 3 months and if LV dysfunction persists despite optimal medical therapy, consider upgrade to CRT-P  Dr. Graciela Husbands to see. Please see recommendations below. Signed, Rick Duff, PA-C 02/23/2013, 12:52 PM  Pt presents with acute systolic CHF.  Symptoms are betterm but the cause is begged.  It could be pacing but that is dx of exclusion.  There is NO afib.  Ischemic should be excluded and a workup is planned For now 1) continue med rx with ARB; consider carvedilol or succinate vs tartrate.   2) ischemic workup per DR MH 3) if LV function does not improve over next 3-6 months and CHF remains a problem consideration at that point for device revision could be entertained  No further recs Call for questions Thanks steve Armanie Martine Reviewed with Dr Fawn Kirk

## 2013-02-23 NOTE — Progress Notes (Signed)
UR completed. Patient changed to inpatient r/t requiring IV antibiotics.  

## 2013-02-24 DIAGNOSIS — J9691 Respiratory failure, unspecified with hypoxia: Secondary | ICD-10-CM | POA: Diagnosis present

## 2013-02-24 LAB — BASIC METABOLIC PANEL
BUN: 29 mg/dL — AB (ref 6–23)
CALCIUM: 8.9 mg/dL (ref 8.4–10.5)
CO2: 23 mEq/L (ref 19–32)
Chloride: 101 mEq/L (ref 96–112)
Creatinine, Ser: 1.51 mg/dL — ABNORMAL HIGH (ref 0.50–1.35)
GFR calc Af Amer: 46 mL/min — ABNORMAL LOW (ref >90)
GFR, EST NON AFRICAN AMERICAN: 40 mL/min — AB (ref >90)
Glucose, Bld: 83 mg/dL (ref 70–99)
Potassium: 4.5 mEq/L (ref 3.7–5.3)
SODIUM: 139 meq/L (ref 137–147)

## 2013-02-24 LAB — CBC
HCT: 38.9 % — ABNORMAL LOW (ref 39.0–52.0)
Hemoglobin: 13.4 g/dL (ref 13.0–17.0)
MCH: 33.9 pg (ref 26.0–34.0)
MCHC: 34.4 g/dL (ref 30.0–36.0)
MCV: 98.5 fL (ref 78.0–100.0)
PLATELETS: 190 10*3/uL (ref 150–400)
RBC: 3.95 MIL/uL — AB (ref 4.22–5.81)
RDW: 14.8 % (ref 11.5–15.5)
WBC: 6.7 10*3/uL (ref 4.0–10.5)

## 2013-02-24 LAB — T4, FREE: Free T4: 1.34 ng/dL (ref 0.80–1.80)

## 2013-02-24 NOTE — Discharge Summary (Signed)
Name: Connor Diaz MRN: [id number] DOB: Oct 04, 1923 80 y.o. PCP: [name], MD  Date of Admission: 02/22/2013 11:17 AM Date of Discharge: 02/25/2013 Attending Physician: Jonah Blue, DO  Discharge Diagnosis: 1. 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 Community acquired pneumonia/Bronchopneumonia 3. Oxygen desaturation  4. EKG abnormality, with history of multivessel CAD 5. LLE cellulitis 6. Hypothyroidism 7. Hypertension 8. Paroxysmal atrial fibrillation (AF) 9. HLD (Hyperlipidemia) 10. GERD 11. BPH 12. Asymptomatic AAA  Discharge Medications:   Medication List    STOP taking these medications       BENICAR HCT 40-12.5 MG per tablet  Generic drug:  olmesartan-hydrochlorothiazide     dutasteride 0.5 MG capsule  Commonly known as:  AVODART     metoprolol tartrate 25 MG tablet  Commonly known as:  LOPRESSOR      TAKE these medications       amiodarone 200 MG tablet  Commonly known as:  PACERONE  Take 0.5 tablets (100 mg total) by mouth daily.     atorvastatin 80 MG tablet  Commonly known as:  LIPITOR  Take 40 mg by mouth daily at 6 PM.     azithromycin 500 MG tablet  Commonly known as:  ZITHROMAX  Take 1 tablet (500 mg total) by mouth daily.     carvedilol 3.125 MG tablet  Commonly known as:  COREG  Take 1 tablet (3.125 mg total) by mouth 2 (two) times daily with a meal.     clopidogrel 75 MG tablet  Commonly known as:  PLAVIX  Take 75 mg by mouth daily with breakfast.     famotidine 20 MG tablet  Commonly known as:  PEPCID  Take 1 tablet (20 mg total) by mouth 2 (two) times daily.     finasteride 5 MG tablet  Commonly known as:  PROSCAR  Take 5 mg by mouth daily.     fish oil-omega-3 fatty acids 1000 MG capsule  Take 1 g by mouth every morning.     furosemide 40 MG tablet  Commonly known as:  LASIX  Take 1 tablet (40 mg total) by mouth daily.     levothyroxine 50 MCG tablet  Commonly known as:  SYNTHROID,  LEVOTHROID  Take 50 mcg by mouth daily before breakfast.     mupirocin ointment 2 %  Commonly known as:  BACTROBAN  Apply 1 application topically 2 (two) times daily. Apply to left lower leg     nitroGLYCERIN 0.4 MG SL tablet  Commonly known as:  NITROSTAT  Place 1 tablet (0.4 mg total) under the tongue every 5 (five) minutes x 3 doses as needed for chest pain.     polyethylene glycol powder powder  Commonly known as:  GLYCOLAX/MIRALAX  Take 17 g by mouth daily as needed for moderate constipation.     PROAIR HFA 108 (90 BASE) MCG/ACT inhaler  Generic drug:  albuterol  Inhale 2 puffs into the lungs every 6 (six) hours as needed for shortness of breath.     ramipril 2.5 MG capsule  Commonly known as:  ALTACE  Take 1 capsule (2.5 mg total) by mouth daily.        Disposition and follow-up:   Mr.Connor Diaz was discharged from [facility] in Stable condition.  At the hospital follow up visit please address:  1.   -Address vitals  -Address medication tolerance and compliance  -Address Volume status -Reassess if patient wants outpatient PT (recommended here) but  he declined -Reduce Famotidine to 20 mg daily-which is renal dosing  -Repeat CXR in 6-8 weeks for resolution pneumonia -Repeat BMET at follow up  -Per EP, if his LV function does not improve over the next 3-6 months and CHF remains a problem, consideration at that point for pacemaker device revision should be entertained. Close follow up was arranged with his PCP. Cardiology also stated he may need CRT/? B/ V ICD if no evidence of ischemia and LV function remains depressed despite maximizing beta blockers and ACE inhibitors  2.  Labs / imaging needed at time of follow-up:  Repeat echo in 3 months-see above  Repeat CXR 6-8 weeks   3.  Pending labs/ test needing follow-up: None  Follow-up Appointments: Follow-up Information   Follow up with [name], MD On 03/03/2013. (@9 :00am. This is  your primary care doctor.)    Specialty:  Internal Medicine   Contact information:   [address] [place] [address] [phone]       Follow up with [name], MD.   Specialty:  Cardiology   Contact information:   [address] [place] [address] [phone]       Follow up with Hillis Range, MD.   Specialty:  Cardiology   Contact information:   [address] [place] [address] [phone]       Discharge Instructions: Discharge Orders   Future Appointments Provider Department Dept Phone   03/15/2013 9:00 AM [facility] [address] [phone]   Eat a light meal the night before the exam. Nothing to eat or drink for at least 8 hours before exam. No gum chewing or smoking the morning of the exam Please take your morning medications with small sips of water, especially blood pressure medication.*Very Important* Please wear 2 piece clothing IF the patient is to see the physician and does not take blood pressure medication, the blood pressure is often elevated and is being flagged in [facility]   03/15/2013 9:30 AM Chuck Hint, MD [facility] [phone]   03/27/2013 9:40 AM [facility] [employer] [phone]   Future Orders Complete By Expires   Diet - low sodium heart healthy  As directed    Discharge instructions  As directed    Comments:     It is important that the patient uses supplemental oxygen with walking and whenever not at rest.  Please follow up with Dr. Janna Arch and Dr. [name] outpatient.  Please call Dr. [name] and schedule.  Your appt with Dr. Janna Arch is already arranged call and confirm  Please pick up your medications from the [facility] in [place]   For home use only DME oxygen  As directed    Questions:     Mode or (Route):  Nasal cannula   Liters per Minute:  3   Frequency:  Continuous  (stationary and portable oxygen unit needed)   Oxygen conserving device:  No   Increase activity slowly  As directed       Consultations: Treatment Team:  [name], MD EP-Dr. Graciela Husbands  Procedures Performed:  Dg Chest 2 View  02/22/2013   CLINICAL DATA:  Difficulty breathing.  Weakness.  EXAM: CHEST  2 VIEW  COMPARISON:  Chest x-ray 02/03/2013.  FINDINGS: Patchy ill-defined airspace opacities throughout the right lung, and the left lung base concerning for potential multilobar bronchopneumonia. Relative sparing of the left upper  lobe. Small bilateral pleural effusions. Pulmonary vasculature is within normal limits. Heart size appears mildly enlarged. The patient is rotated to the right on today's exam, resulting in distortion of the mediastinal contours and reduced diagnostic sensitivity and specificity for mediastinal pathology. Atherosclerosis in the thoracic aorta. Left-sided pacemaker device in place with lead tips projecting over the expected location of the right atrium and right ventricular free wall.  IMPRESSION: 1. Interval development of patchy multifocal airspace disease in the left lower lobe and throughout the right lung, concerning for developing multilobar bronchopneumonia. 2. Small bilateral pleural effusions. 3. Mild cardiomegaly. 4. Atherosclerosis.   Electronically Signed   By: Trudie Reed M.D.   On: 02/22/2013 13:12   Dg Chest 2 View  02/03/2013   CLINICAL DATA:  Chest congestion.  EXAM: CHEST  2 VIEW  COMPARISON:  03/23/2012.  FINDINGS: Cardiomegaly with normal pulmonary vascularity noted. Cardiac pacer noted with lead tips in right atrium right ventricle. Mild interstitial prominence with pleural effusions noted. Component of congestive heart failure should be considered. No pneumothorax. No acute osseous abnormality.  IMPRESSION: Findings suggesting mild congestive heart failure with mild interstitial pulmonary edema and small pleural effusions. Cardiac pacer in good  anatomic position.   Electronically Signed   By: Maisie Fus  Register   On: 02/03/2013 10:13    2D Echo:  ------------------------------------------------------------ Transthoracic Echocardiography  Patient: Migel, Hannis MR #: [id number] Study Date: 02/23/2013 Gender: M Age: 76 Height: 180.3cm Weight: 81.6kg BSA: 2.22m^2 Pt. Status: Room: C23C  ADMITTING Jonah Blue ATTENDING Gerlene Fee SONOGRAPHER Jimmy Reel PERFORMING [facility], Inpatient cc:  ------------------------------------------------------------ LV EF: 20% - 25%  ------------------------------------------------------------ Indications: Bradycardia 427.81. CHF - 428.0. CAD of native vessels 414.01.  ------------------------------------------------------------ Study Conclusions  - Left ventricle: The cavity size was severely dilated. Systolic function was severely reduced. The estimated ejection fraction was in the range of 20% to 25%. Doppler parameters are consistent with abnormal left ventricular relaxation (grade 1 diastolic dysfunction). - Aortic valve: Mild regurgitation. - Mitral valve: Mild regurgitation. - Left atrium: The atrium was moderately to severely dilated. - Right ventricle: Systolic function was mildly reduced. - Right atrium: The atrium was moderately dilated. - Pulmonary arteries: PA peak pressure: 37mm Hg (S). Transthoracic echocardiography. M-mode, complete 2D, spectral Doppler, and color Doppler. Height: Height: 180.3cm. Height: 71in. Weight: Weight: 81.6kg. Weight: 179.6lb. Body mass index: BMI: 25.1kg/m^2. Body surface area: BSA: 2.28m^2. Blood pressure: 128/62. Patient status: Inpatient. Location: Bedside.  ------------------------------------------------------------  MYOCARDIAL IMAGING WITH SPECT (REST AND PHARMACOLOGIC-STRESS)  GATED LEFT VENTRICULAR WALL MOTION STUDY  LEFT VENTRICULAR EJECTION FRACTION  TECHNIQUE:    Standard myocardial SPECT imaging was performed after resting  intravenous injection of 10 mCi Tc-71m sestamibi. Subsequently,  intravenous infusion of Lexiscan was performed under the supervision  of the Cardiology staff. At peak effect of the drug, 30 mCi Tc-49m  sestamibi was injected intravenously and standard myocardial SPECT  imaging was performed. Quantitative gated imaging was also performed  to evaluate left ventricular wall motion, and estimate left  ventricular ejection fraction.  COMPARISON: None.  FINDINGS:  Utilizing gated data, the end-diastolic volume is estimated to be  196 mL and the end systolic volume 135 mL. Calculated ejection  fraction is 31%.  Gated wall motion analysis shows significant hypokinesis involving  the inferior wall, septum and left ventricular apex. Milder  hypokinesis is present involving the anterior and lateral walls. No  overt akinetic or dyskinetic segments are identified.  SPECT imaging demonstrates  significant scar involving most of the  inferior wall, the inferolateral wall, the lower septum, the distal  anterior wall and the apex. No significant inducible myocardial  ischemia is identified. There may be a small region of reversibility  of the mid anterior wall towards the septum at the margin of prior  infarction. This is a tiny area of potential reversibility.  IMPRESSION:  1. No evidence of significant inducible myocardial ischemia.  Extensive scar is identified related to prior infarction. There may  be a tiny area of reversibility along the margin of prior anterior  wall infarction.  2. Left ventricular dysfunction with quantitative ejection fraction  calculation of 31%. Significant hypokinesis is present involving the  inferior wall, septum and apex.  Cardiac Cath: none  Admission HPI:  CHAKA JEFFERYS is a 80 y.o. male with PMH HTN, multivessel coronary artery disease s/p LHC and multivessel stenting (left circumflex, LAD, and  obtuse marginal 3) in 12/2011 by Dr. 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   Patient reports that for the past 2-3 weeks he has experienced exertional dyspnea, not present at rest, gradually worsening over the past few days. He denies cough, fever, chills, sore throat, rhinorrhea, nausea, vomiting, diarrhea. He denies leg swelling or orthopnea, but does endorse coinciding PND-type symptoms for the past 2-3 weeks. He presents today after seeing his PCP, Dr. Oval Linsey, this morning who performed an echocardiogram in his office showing reduced EF 25-30%, dilated LV with hypokinesis, marked AI (I will place report in shadow chart). Patient has no history of heart failure, and his last 2D echo in 11/2011 was within normal limits, EF 50-55%.   His cardiologist is Dr. [name]. He was last seen in clinic on 12/23/12. At that time they interrogated his pacemaker and decreased his amiodarone to 100mg  daily. He was evaluated recently by Dr. Johney Frame for permanent pacemaker. He is recovering from a LLE cellulitis for which he completed a 14-day course of Keflex. Denies tobacco, alcohol, or drug use. He has not been hospitalized in the past 3 months.   In the ED he received Lasix 20mg  IV x1, NG ointment 1 inch x1, SL NG 0.4mg  x1, azithromycin 500mg  IV x1, ceftriaxone 1g IV x1.  Physical Exam:  Blood pressure 144/75, pulse 59, temperature 97.5 F (36.4 C), temperature source Oral, resp. rate 25, SpO2 98.00%.  Physical Exam  Constitutional: He is oriented to person, place, and time and well-developed, well-nourished, and in no distress.  HENT:  Head: Normocephalic and atraumatic.  Eyes: Conjunctivae and EOM are normal. Pupils are equal, round, and reactive to light.  Neck: Normal  range of motion. Neck supple. No tracheal deviation present.  Cardiovascular: Normal rate and regular rhythm. Exam reveals no gallop and no friction rub.  No murmur heard. Difficult auscultation. In paced NSR on monitor.  Pulmonary/Chest: Accessory muscle usage (Some belly breathing.) present. No stridor. No respiratory distress. He has wheezes (Anterior lung fields). He has rhonchi (Soft crackles in left lung base.). He has no rales. He exhibits no tenderness.  Abdominal: Soft. Bowel sounds are normal. He exhibits no distension and no mass. There is no tenderness. There is no rebound and no guarding.  No ascites.  Musculoskeletal: Normal range of motion. He exhibits no edema and no tenderness.  No LE edema.  Neurological: He is alert and  oriented to person, place, and time. No cranial nerve deficit. GCS score is 15.  Skin: Skin is warm and dry. Rash (Erythema of the LLE, no swelling or induration. 1cm scrape on medial side of left foot with surrounding erythema. Spots of erythema on big toe.) noted. He is not diaphoretic.  Psychiatric: Memory, affect and judgment normal.    Hospital Course by problem list: 1. 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 Community acquired pneumonia/Bronchopneumonia 3. Oxygen desaturation  4. EKG abnormality, with history of multivessel CAD 5. LLE cellulitis 6. Hypothyroidism 7. Hypertension 8. Paroxysmal atrial fibrillation (AF) 9. HLD (Hyperlipidemia) 10. GERD 11. BPH 12. Asymptomatic AAA   Gabrielle DareHoward R Eissler is a 80 y.o. male with PMH HTN, multivessel coronary artery disease s/p LHC and multivessel stenting (left circumflex, LAD, and obtuse marginal 3) in 12/2011 by Dr. 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 Janna ArchonDiego due to office echo performed with EF found to be 25-30%.    1. 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 2D echo interpreted by his PCP in the office on 02/22/13 showed EF of 25-30%. Pro-BNP here was elevated at 6923. 2D echo in 11/2011 showed normal systolic function, EF 50-55%, so this was a new problem for him. Repeat 2D echo here on 02/23/13 showed severely dilated left ventricle, severely reduced systolic function, EF 20-25%, grade 1 diastolic dysfunction, moderately to severely dilated left atrium, mildly reduced right ventricular systolic function, moderately dilated right atrium. Pacemaker interrogation on 02/23/13 showed nomal PPM function, NO interval AFib episodes, VP 100%. Per Electrophysiology (EP) (Cardiology), etiology of acute systolic HF could be his pacing, but that would be a diagnosis of exclusion. We felt it necessary to rule out ischemia first given known multivessel CAD. Troponins were trended on admission and negative x3. The patient underwent a nuclear stress test on 02/25/13 which showed above. The patient was discharged on Lasix 40mg  po daily,  Coreg 3.125 mg BID, Ramipril 2.5 mg daily and statin. Per EP, if his LV function does not improve over the next 3-6 months and CHF remains a problem, consideration at that point for pacemaker device revision should be entertained. Close follow up was arranged with his PCP.  2. Community acquired pneumonia/Bronchopneumonia Chest x-ray on admission showed patchy multifocal airspace disease in the left lower lobe and throughout the right lung, concerning for developing multilobar bronchopneumonia. He was consistently afebrile, not tachycardic, and denied cough. Flu panel was negative. Respiratory virus panel negative. HIV negative. Strep pneumo and legionella urinary antigen negative. His lung exam improved with antibiotic therapy on Azithromycin  500mg  IV daily and Ceftriaxone 1g IV daily on 1/7-1/10. We transitioned him to him to oral Azithromycin at discharge for total 7 days of antibiotics.  Blood cultures x2 > No growth to date.  He also has ProAir at home as needed.    3. Oxygen desaturation  PT worked with him on 02/23/13. During that time he was saturating 97% on RA at rest in the room, but had slow continual desaturations with activity as low as 87% with recovery only to 91% on RA with standing rest and pursed lip breathing. On 2L, his oxygen saturation increased back to 97%. PT recommended outpatient PT but  patient declined.  They also states he needs a cane which he has at home and also a walker. Home O2 was ordered at discharge for patient to use with exertion.  On day of discharge he was desaturating w/o oxygen to 81% with ambulation but improved to 95% on 3L.    4. EKG abnormality, with history of multivessel CAD Patient has a history of multivessel coronary artery disease s/p LHC and multivessel stenting in 2013. Admission EKG showed new TWI in inferior leads and V3. Troponins were cycled and negative x3. Repeat EKG with improvement in inferior TWI, but new lateral TWI. Difficult to interpret significance given paced rhythm. We performed a stress test, results as above. Patient denied chest pain throughout his stay. We continued his home Plavix 75mg  daily.  5. LLE cellulitis Patient presented with LLE erythema, appartently s/p 14 days of Keflex for treatment of cellulitis. Still with erythema of calf, foot. Wound care saw him, felt this represented a resolving cellulitis and no treatment was recommended. We provided Bactroban cream BID and he will follow up with Dr. Margo Aye (Dermatology) outpatient.    6. Hypothyroidism TSH was elevated at 8.5, but this was is in setting of acute illness. Free T4 was wnl at 1.34. We continued his home synthroid daily.  7. Hypertension Well controlled on Coreg 3.125 mg BID. Discontinued  olmesartan-HCTZ 40-12.5 mg daily to protect his kidneys, and given soft blood pressures on admission. At discharge he was sent home on Lasix, Coreg 3.125 mg bid and Ramapril.  We stopped the combo ARB thiazide.    8. Paroxysmal atrial fibrillation (AF) Patient presented in a paced rhythm. No episodes of AF on pacemaker interrogation. No indication for anticoagulation at this time. We continued his home amiodarone 100mg  daily.  9. HLD (Hyperlipidemia) Lipid Panel     Component Value Date/Time   CHOL 124 11/19/2011 0448   TRIG 87 11/19/2011 0448   HDL 46 11/19/2011 0448   CHOLHDL 2.7 11/19/2011 0448   VLDL 17 11/19/2011 0448   LDLCALC 61 11/19/2011 0448    We continued Lipitor 40mg  daily and Lovaza 1g daily.   10. GERD We reduced his Famotidine to 20mg  daily which is renal dosing.   11. BPH Continued home finasteride 5mg  daily. Will stop Dutasteride as they are duplicate medications   12. Asymptomatic AAA Patient last saw [facility] in the clinic on 03/07/12. Duplex ultrasound at that time showed a maximum diameter of 4.81 AP by 4.84 transverse, slight increase from January 2012 when he was 4.4 x 4.5. Repeat duplex on 09/07/12 showed no significant change. Continue to monitor as an outpatient.  13. DVT px  -scds, On Plavix    Discharge Vitals:   Discharge physical exam: General: resting in bed, NAD  HEENT: [place]/at, no scleral icterus  Cardiac: RRR, no rubs, murmurs or gallops  Pulm: clear to auscultation bilaterally, no wheezes, rales, or rhonchi. On [place]  Abd: soft, nontender, nondistended, BS present  Ext: warm and well perfused, no pedal edema  Neuro: alert and oriented X3, cranial nerves II-XII grossly intact  BP 112/63  Pulse 64  Temp(Src) 97.4 F (36.3 C) (Oral)  Resp 20  Ht 5\' 11"  (1.803 m)  Wt 174 lb 3.2 oz (79.017 kg)  BMI 24.31 kg/m2  SpO2 94%  Discharge Labs:  Results for JAMIS, KRYDER (MRN [id number]) as of 02/25/2013 17:13  Ref. Range 02/22/2013 11:23 02/22/2013 16:36  02/23/2013 02:55 02/24/2013 05:57 02/25/2013 04:55  Sodium Latest Range: 137-147 mEq/L  140  138 139 137  Potassium Latest Range: 3.7-5.3 mEq/L 5.3  5.3 4.5 4.2  Chloride Latest Range: 96-112 mEq/L 104  102 101 102  CO2 Latest Range: 19-32 mEq/L 24  25 23 22   BUN Latest Range: 6-23 mg/dL 24 (H)  25 (H) 29 (H) 32 (H)  Creatinine Latest Range: 0.50-1.35 mg/dL 1.61  0.96 (H) 0.45 (H) 1.46 (H)  Calcium Latest Range: 8.4-10.5 mg/dL 9.0  8.8 8.9 8.4  GFR calc non Af Amer Latest Range: >90 mL/min 46 (L)  42 (L) 40 (L) 41 (L)  GFR calc Af Amer Latest Range: >90 mL/min 53 (L)  49 (L) 46 (L) 48 (L)  Glucose Latest Range: 70-99 mg/dL 93  83 83 86  Magnesium Latest Range: 1.5-2.5 mg/dL  2.1     Alkaline Phosphatase Latest Range: 39-117 U/L 83      Albumin Latest Range: 3.5-5.2 g/dL 3.5      AST Latest Range: 0-37 U/L 17      ALT Latest Range: 0-53 U/L 7      Total Protein Latest Range: 6.0-8.3 g/dL 6.6      Total Bilirubin Latest Range: 0.3-1.2 mg/dL 1.0      Results for VISHAAL, STROLLO (MRN [id number]) as of 02/25/2013 17:13  Ref. Range 02/22/2013 11:23 02/22/2013 12:06 02/22/2013 16:08 02/22/2013 21:25 02/23/2013 02:55  Troponin I Latest Range: <0.30 ng/mL   <0.30 <0.30 <0.30  Troponin i, poc Latest Range: 0.00-0.08 ng/mL  0.01     Pro B Natriuretic peptide (BNP) Latest Range: 0-450 pg/mL 6923.0 (H)       Results for DORIEN, MAYOTTE (MRN [id number]) as of 02/25/2013 17:13  Ref. Range 02/22/2013 15:31  Lactic Acid, Venous Latest Range: 0.5-2.2 mmol/L 1.05   Results for LUKUS, BINION (MRN [id number]) as of 02/25/2013 17:13  Ref. Range 02/22/2013 11:23 02/23/2013 02:55 02/24/2013 05:57  WBC Latest Range: 4.0-10.5 K/uL 9.0 8.2 6.7  RBC Latest Range: 4.22-5.81 MIL/uL 4.18 (L) 3.75 (L) 3.95 (L)  Hemoglobin Latest Range: 13.0-17.0 g/dL 62.9 52.8 (L) 41.3  HCT Latest Range: 39.0-52.0 % 41.2 37.3 (L) 38.9 (L)  MCV Latest Range: 78.0-100.0 fL 98.6 99.5 98.5  MCH Latest Range: 26.0-34.0 pg 34.0 34.1 (H) 33.9  MCHC  Latest Range: 30.0-36.0 g/dL 24.4 01.0 27.2  RDW Latest Range: 11.5-15.5 % 14.9 14.9 14.8  Platelets Latest Range: 150-400 K/uL 186 169 190  Neutrophils Relative % Latest Range: 43-77 %  51   Lymphocytes Relative Latest Range: 12-46 %  22   Monocytes Relative Latest Range: 3-12 %  19 (H)   Eosinophils Relative Latest Range: 0-5 %  8 (H)   Basophils Relative Latest Range: 0-1 %  0   NEUT# Latest Range: 1.7-7.7 K/uL  4.2   Lymphocytes Absolute Latest Range: 0.7-4.0 K/uL  1.8   Monocytes Absolute Latest Range: 0.1-1.0 K/uL  1.5 (H)   Eosinophils Absolute Latest Range: 0.0-0.7 K/uL  0.7   Basophils Absolute Latest Range: 0.0-0.1 K/uL  0.0     Results for LEGEND, TUMMINELLO (MRN [id number]) as of 02/25/2013 17:13  Ref. Range 02/23/2013 02:55 02/24/2013 05:57  TSH Latest Range: 0.350-4.500 uIU/mL 8.507 (H)   Free T4 Latest Range: 0.80-1.80 ng/dL  7.42   Results for GAIUS, ISHAQ (MRN [id number]) as of 02/25/2013 17:13  Ref. Range 02/22/2013 16:28 02/22/2013 16:36  Influenza A By PCR Latest Range: NEGATIVE  NEGATIVE   Influenza B By PCR Latest Range: NEGATIVE  NEGATIVE   H1N1 flu by pcr Latest  Range: NOT DETECTED  NOT DETECTED   HIV Latest Range: NON REACTIVE   NON REACTIVE   Results for SAMIL, MECHAM (MRN [id number]) as of 02/25/2013 17:13  Ref. Range 02/22/2013 20:52  Legionella Antigen, Urine No range found Negative for Legi...  Strep Pneumo Urinary Antigen Latest Range: NEGATIVE  NEGATIVE    RSV panel negative  Blood cultures negative to date    Results for orders placed during the hospital encounter of 02/22/13 (from the past 24 hour(s))  BASIC METABOLIC PANEL     Status: Abnormal   Collection Time    02/25/13  4:55 AM      Result Value Range   Sodium 137  137 - 147 mEq/L   Potassium 4.2  3.7 - 5.3 mEq/L   Chloride 102  96 - 112 mEq/L   CO2 22  19 - 32 mEq/L   Glucose, Bld 86  70 - 99 mg/dL   BUN 32 (*) 6 - 23 mg/dL   Creatinine, Ser 4.09 (*) 0.50 - 1.35 mg/dL   Calcium 8.4   8.4 - 81.1 mg/dL   GFR calc non Af Amer 41 (*) >90 mL/min   GFR calc Af Amer 48 (*) >90 mL/min    Signed: Annett Gula, MD 02/25/2013, 5:43 PM   Time Spent on Discharge: 45 minutes Services Ordered on Discharge: None Equipment Ordered on Discharge: None

## 2013-02-24 NOTE — Care Management Note (Signed)
    Page 1 of 1   02/24/2013     3:40:23 PM   CARE MANAGEMENT NOTE 02/24/2013  Patient:  MARTINEZ, WATERFIELD   Account Number:  [id number]  Date Initiated:  02/24/2013  Documentation initiated by:  Oletta Cohn  Subjective/Objective Assessment:   80 y.o. male with PMH HTN, multivessel coronary artery disease s/p LHC and multivessel stenting; presents with shortness of breath.//Home Nola Ahlquist,[phone]( H) [id number])     Action/Plan:   Pacemaker interrogation, diurese//Home with HH   Anticipated DC Date:  02/26/2013   Anticipated DC Plan:  HOME W HOME HEALTH SERVICES      DC Planning Services  CM consult      Choice offered to / List presented to:          [facility] arranged  HH - 11 Patient Refused      Status of service:  Completed, signed off Medicare Important Message given?   (If response is "NO", the following Medicare IM given date fields will be blank) Date Medicare IM given:   Date Additional Medicare IM given:    Discharge Disposition:    Per UR Regulation:    If discussed at Long Length of Stay Meetings, dates discussed:    Comments:  02/24/13 1515 Ronica Vivian, RN, BSN, [employer] [phone] I have recommended that this patient have Home Health services but he declines at this time. I have discussed the benefits of Home Health with him. The patient verbalizes understanding.  States he utilizes the pool at the [facility] 3-4 times a week and his wife takes very good care of him. Will continue to follow.

## 2013-02-24 NOTE — Progress Notes (Signed)
  Date: 02/24/2013  Patient name: Connor Diaz  Medical record number: [id number]  Date of birth: 21-Apr-1923   This patient has been seen and the plan of care was discussed with the house staff. Please see their note for complete details. I concur with their findings with the following additions/corrections: States he feels better this morning. He has acute hypoxic respiratory failure and is now requiring supplemental O2, more evident on exertion. Denies CP. Appreciate cards and EP involvement. He has acute HFrEF. Continue treatment for CAP which could have triggered HF. Watch tele for QT interval given tx with azithro.  Plans for MPS, but given hx of CAD, may need to make decision on LHC given risks of CIN.  Jonah Blue, DO, FACP Faculty [facility] Medicine Residency Program 02/24/2013, 12:35 PM

## 2013-02-24 NOTE — Progress Notes (Signed)
  I have seen and examined the patient, and reviewed the daily progress note by Rande Brunt, MS 3 and discussed the care of the patient with them. Please see my progress note from 02/24/2013 for further details regarding assessment and plan.    Signed:  Vivi Barrack, MD 02/24/2013, 11:28 AM

## 2013-02-24 NOTE — Progress Notes (Signed)
Subjective: Patient seen and examined at the bedside this morning. He again feels better this morning. His shortness of breath has improved, but he feels quite weak. He tells [place] became dyspneic while working with PT yesterday. He denies cough, fevers, chills, chest pain, N/V/D, leg swelling, abdominal pain. He is tolerating nasal cannula oxygen.  Objective: Vital signs in last 24 hours: Filed Vitals:   02/23/13 1655 02/23/13 1956 02/24/13 0543 02/24/13 0740  BP: 117/60 104/64 139/60 129/75  Pulse:  60 65 71  Temp:  98.2 F (36.8 C) 97.9 F (36.6 C)   TempSrc:  Oral Oral   Resp:  20 20   Height:      Weight:   173 lb 8 oz (78.7 kg)   SpO2: 96% 99% 98% 93%   Weight change: -6 lb 13.4 oz (-3.1 kg)  Intake/Output Summary (Last 24 hours) at 02/24/13 0847 Last data filed at 02/24/13 0835  Gross per 24 hour  Intake   1130 ml  Output    900 ml  Net    230 ml   Physical Exam  Constitutional: He is oriented to person, place, and time and well-developed, well-nourished, and in no distress.  HENT:  Head: Normocephalic and atraumatic.  Eyes: Conjunctivae and EOM are normal. Pupils are equal, round, and reactive to light.  Neck: Normal range of motion. Neck supple. No tracheal deviation present.  Cardiovascular: Normal rate and regular rhythm. Exam reveals no gallop and no friction rub.  No murmur heard. Difficult auscultation. In paced NSR on monitor.  Pulmonary/Chest: No respiratory distress. Clear to ausculation today. He has no rales. He exhibits no tenderness.  Abdominal: Soft. Bowel sounds are normal. He exhibits no distension and no mass. There is no tenderness. There is no rebound and no guarding.  No ascites.  Musculoskeletal: Normal range of motion. He exhibits no edema and no tenderness.  No LE edema.  Neurological: He is alert and oriented to person, place, and time. No cranial nerve deficit. GCS score is 15.  Skin: Skin is warm and dry. Rash (Erythema of the LLE, no  swelling or induration. 1cm scrape on medial side of left foot with surrounding erythema. Spots of erythema on big toe.) noted. Wound care is following.  Psychiatric: Memory, affect and judgment normal.   Lab Results: Basic Metabolic Panel:  Recent Labs Lab 02/22/13 1636 02/23/13 0255 02/24/13 0557  NA  --  138 139  K  --  5.3 4.5  CL  --  102 101  CO2  --  25 23  GLUCOSE  --  83 83  BUN  --  25* 29*  CREATININE  --  1.44* 1.51*  CALCIUM  --  8.8 8.9  MG 2.1  --   --    Liver Function Tests:  Recent Labs Lab 02/22/13 1123  AST 17  ALT 7  ALKPHOS 83  BILITOT 1.0  PROT 6.6  ALBUMIN 3.5   No results found for this basename: LIPASE, AMYLASE,  in the last 168 hours No results found for this basename: AMMONIA,  in the last 168 hours CBC:  Recent Labs Lab 02/23/13 0255 02/24/13 0557  WBC 8.2 6.7  NEUTROABS 4.2  --   HGB 12.8* 13.4  HCT 37.3* 38.9*  MCV 99.5 98.5  PLT 169 190   Cardiac Enzymes:  Recent Labs Lab 02/22/13 1608 02/22/13 2125 02/23/13 0255  TROPONINI <0.30 <0.30 <0.30   BNP:  Recent Labs Lab 02/22/13 1123  PROBNP 6923.0*  D-Dimer: No results found for this basename: DDIMER,  in the last 168 hours CBG: No results found for this basename: GLUCAP,  in the last 168 hours Hemoglobin A1C: No results found for this basename: HGBA1C,  in the last 168 hours Fasting Lipid Panel: No results found for this basename: CHOL, HDL, LDLCALC, TRIG, CHOLHDL, LDLDIRECT,  in the last 168 hours Thyroid Function Tests:  Recent Labs Lab 02/23/13 0255  TSH 8.507*   Coagulation: No results found for this basename: LABPROT, INR,  in the last 168 hours Anemia Panel: No results found for this basename: VITAMINB12, FOLATE, FERRITIN, TIBC, IRON, RETICCTPCT,  in the last 168 hours Urine Drug Screen: Drugs of Abuse  No results found for this basename: labopia,  cocainscrnur,  labbenz,  amphetmu,  thcu,  labbarb    Alcohol Level: No results found for this  basename: ETH,  in the last 168 hours Urinalysis: No results found for this basename: COLORURINE, APPERANCEUR, LABSPEC, PHURINE, GLUCOSEU, HGBUR, BILIRUBINUR, KETONESUR, PROTEINUR, UROBILINOGEN, NITRITE, LEUKOCYTESUR,  in the last 168 hours Misc. Labs:   Micro Results: Recent Results (from the past 240 hour(s))  CULTURE, BLOOD (ROUTINE X 2)     Status: None   Collection Time    02/22/13  4:46 PM      Result Value Range Status   Specimen Description BLOOD ARM LEFT   Final   Special Requests BOTTLES DRAWN AEROBIC AND ANAEROBIC 10CC   Final   Culture  Setup Time     Final   Value: 02/23/2013 00:26     Performed at [facility]   Culture     Final   Value:        BLOOD CULTURE RECEIVED NO GROWTH TO DATE CULTURE WILL BE HELD FOR 5 DAYS BEFORE ISSUING A FINAL NEGATIVE REPORT     Performed at [facility]   Report Status PENDING   Incomplete  CULTURE, BLOOD (ROUTINE X 2)     Status: None   Collection Time    02/22/13  4:52 PM      Result Value Range Status   Specimen Description BLOOD HAND LEFT   Final   Special Requests BOTTLES DRAWN AEROBIC ONLY 10CC   Final   Culture  Setup Time     Final   Value: 02/23/2013 00:25     Performed at [facility]   Culture     Final   Value:        BLOOD CULTURE RECEIVED NO GROWTH TO DATE CULTURE WILL BE HELD FOR 5 DAYS BEFORE ISSUING A FINAL NEGATIVE REPORT     Performed at [facility]   Report Status PENDING   Incomplete  RESPIRATORY VIRUS PANEL     Status: None   Collection Time    02/23/13  4:23 AM      Result Value Range Status   Source - RVPAN NASAL SWAB   Corrected   Comment: CORRECTED ON 01/08 AT 1740: PREVIOUSLY REPORTED AS NASAL SWAB   Respiratory Syncytial Virus A NOT DETECTED   Final   Respiratory Syncytial Virus B NOT DETECTED   Final   Influenza A NOT DETECTED   Final   Influenza B NOT DETECTED   Final   Parainfluenza 1 NOT DETECTED   Final   Parainfluenza 2 NOT DETECTED   Final   Parainfluenza 3  NOT DETECTED   Final   Metapneumovirus NOT DETECTED   Final   Rhinovirus NOT DETECTED   Final  Adenovirus NOT DETECTED   Final   Influenza A H1 NOT DETECTED   Final   Influenza A H3 NOT DETECTED   Final   Comment: (NOTE)           Normal Reference Range for each Analyte: NOT DETECTED     Testing performed using the Luminex xTAG Respiratory Viral Panel test     kit.     This test was developed and its performance characteristics determined     by [facility]. It has not been cleared or approved by the [place]     Food and Drug Administration. This test is used for clinical purposes.     It should not be regarded as investigational or for research. This     laboratory is certified under the [place] (CLIA) as qualified to perform high complexity     clinical laboratory testing.     Performed at [facility]   Studies/Results: Dg Chest 2 View  02/22/2013   CLINICAL DATA:  Difficulty breathing.  Weakness.  EXAM: CHEST  2 VIEW  COMPARISON:  Chest x-ray 02/03/2013.  FINDINGS: Patchy ill-defined airspace opacities throughout the right lung, and the left lung base concerning for potential multilobar bronchopneumonia. Relative sparing of the left upper lobe. Small bilateral pleural effusions. Pulmonary vasculature is within normal limits. Heart size appears mildly enlarged. The patient is rotated to the right on today's exam, resulting in distortion of the mediastinal contours and reduced diagnostic sensitivity and specificity for mediastinal pathology. Atherosclerosis in the thoracic aorta. Left-sided pacemaker device in place with lead tips projecting over the expected location of the right atrium and right ventricular free wall.  IMPRESSION: 1. Interval development of patchy multifocal airspace disease in the left lower lobe and throughout the right lung, concerning for developing multilobar bronchopneumonia. 2. Small bilateral pleural  effusions. 3. Mild cardiomegaly. 4. Atherosclerosis.   Electronically Signed   By: Vinnie Langton M.D.   On: 02/22/2013 13:12   Medications: I have reviewed the patient's current medications. Scheduled Meds: . amiodarone  100 mg Oral Daily  . atorvastatin  40 mg Oral q1800  . azithromycin  500 mg Oral Daily  . carvedilol  3.125 mg Oral BID WC  . cefTRIAXone (ROCEPHIN)  IV  1 g Intravenous Q24H  . clopidogrel  75 mg Oral Q breakfast  . enoxaparin (LOVENOX) injection  40 mg Subcutaneous Q24H  . famotidine  20 mg Oral QHS  . finasteride  5 mg Oral Daily  . furosemide  40 mg Oral Daily  . levothyroxine  50 mcg Oral QAC breakfast  . mupirocin cream   Topical BID  . omega-3 acid ethyl esters  1 g Oral Daily  . polyethylene glycol  17 g Oral Daily   Continuous Infusions:  PRN Meds:.albuterol, nitroGLYCERIN, sodium chloride Assessment/Plan: ASHWATH LASCH is a 80 y.o. male with PMH HTN, multivessel coronary artery disease s/p LHC and multivessel stenting (left circumflex, LAD, and obtuse marginal 3) in 12/2011 by Dr. Terrence Dupont, tachy-brady syndrome with symptomatic bradycardia s/p pacemaker placed in 11/2011, paroxysmal atrial fibrillation, chronic kidney disease stage 3, hypothyroidism, asymptomatic AAA followed [facility], who presents with shortness of breath.   #Acute systolic heart failure - Patient presents with 2-3 weeks of worsening exertional SOB. 2D echo interpreted by his PCP 02/22/13 showed EF of 25-30%. Pro-BNP elevated at 6923. 2D echo in 11/2011 showed normal systolic function, EF 67-20%. Repeat  2D echo here on 02/23/13 showed severely dilated left ventricle, severely reduced systolic function, EF 16-10%, grade 1 diastolic dysfunction, moderately to severely dilated left atrium, mildly reduced right ventricular systolic function, moderately dilated right atrium. Pacemaker interrogation on 02/23/13 showed nomal PPM function, NO interval AFib episodes, VP 100%. Per EP,  etiology of acute systolic HF could be pacing, but that is a diagnosis of exclusion. We will rule out ischemia first. Troponins were trended on admission and negative x3. - Appreciate cardiology recs, Dr. Terrence Dupont  - Appreciate EP recs, Dr. Caryl Comes - Plan for nuclear stress test this weekend to rule out ischemia - Lasix $Remov'40mg'MoBues$  po daily - Coreg 3.125 mg BID  - Monitor renal function closely, will consider adding ACE/ARB as renal function and blood pressure permits, Cr trend below - Daily weights, trend below - Strict ins and outs > Net +60cc yesterday, -950cc UOP - BMP in am  [facility] Weights   02/22/13 2011 02/23/13 0207 02/24/13 0543  Weight: 180 lb 5.4 oz (81.8 kg) 179 lb 7.3 oz (81.4 kg) 173 lb 8 oz (78.7 kg)    Creatinine, Ser  Date Value Range Status  02/24/2013 1.51* 0.50 - 1.35 mg/dL Final  02/23/2013 1.44* 0.50 - 1.35 mg/dL Final  02/22/2013 1.34  0.50 - 1.35 mg/dL Final  12/23/2011 1.31  0.50 - 1.35 mg/dL Final  12/22/2011 1.47* 0.50 - 1.35 mg/dL Final    #Community acquired pneumonia - Chest x-ray shows patchy multifocal airspace disease in the left lower lobe and throughout the right lung, concerning for developing multilobar bronchopneumonia. He remains afebrile, not tachycardic. Flu panel was negative. Respiratory virus panel negative. HIV NR. Strep pneumo and legionella urinary antigen negative. His lung exam is improving with antibiotic therapy. - Azithromycin $RemoveBefore'500mg'ZkFUiwDoqexlY$  IV daily - Ceftriaxone 1g IV daily - Albuterol nebs q6h prn shortness of breath  - Blood cultures x2 > NGTD - Continuous pulse oximetry  - BMP in am   #Oxygen desaturation - PT worked with him yesterday. During that time he was saturating 97% on RA at rest in room, but had slow continual desaturations with activity as low as 87% with recovery only to 91% on RA with standing rest and pursed lip breathing. On 2L, his oxygen saturation increased back to 97%. They are recommend outpatient PT. - Continue O2 therapy -  Outpatient PT referral upon discharge, though patient tells [place] he probably doesn't want it since he is able to work out at the pool - May need home O2 upon discharge, will continue to monitor sats  #EKG abnormality, with history of multivessel CAD - Patient has a history of multivessel coronary artery disease s/p LHC and multivessel stenting in 2013. Admission EKG showed new TWI in inferior leads and V3. Troponins were cycled and negative x3. Repeat EKG with improvement in inferior TWI, but new lateral TWI. Difficult to interpret significance given paced rhythm.  - Appreciate EP consult as above - Rule out ischemia with stress test this weekend - SL NG 0.$Remov'4mg'vFGdQJ$  q55min prn chest pain  - Continue home Plavix $RemoveBef'75mg'CWltWQrFNi$  daily   #LLE cellulitis - Patient is supposedly s/p 14 days of Keflex. Still with erythema of calf, foot. Wound care saw him, no treatment recommended. - Appreciate wound care recs - Bactroban cream BID   #Hypothyroidism - TSH was elevated at 8.5 but this is is in setting of acute illness. Free T4 is in process. - Follow up free T4 - Continue home synthroid 33mcg daily  #Hypertension - Currently  well controlled.  - Continue Coreg 3.125 mg BID - Holding home olmesartan-HCTZ 40-12.5 mg daily, will consider adding back tomorrow as renal function and blood pressure permits   #Paroxysmal atrial fibrillation - Patient is in a paced rhythm currently. No episodes of AF on pacemaker interrogation. No indication for anticoagulation at this time. - Continue home amiodarone $RemoveBeforeD'100mg'cKxytnuKMtrinz$  daily   #HLD - Continue Lipitor $RemoveBeforeDE'40mg'VrULotUcLxwxlsU$  daily and Lovaza 1g daily.   #GERD - Reducing Famotidine to $RemoveBefor'20mg'abSWBRrUFLqE$  daily which is renal dosing.   #BPH - Continue home finasteride $RemoveBeforeDE'5mg'FlVbkYAvVWAOIoO$  daily.   #Asymptomatic AAA - Patient last saw [facility] in the clinic on 03/07/12. Duplex ultrasound at that time showed a maximum diameter of 4.81 AP by 4.84 transverse, slight increase from January 2012 when he was 4.4 x 4.5. Repeat duplex on 09/07/12  showed no significant change.   #DVT PPX - Lovenox subq and SCDs   #Code status - Patient would like to be DNI, but he *would* like chest compressions/defibrillation. Partial code status updated in EPIC.   Dispo: Disposition is deferred at this time, awaiting improvement of current medical problems.  Anticipated discharge in approximately 1-3 day(s).   The patient does have a current PCP (Richard Marcia Brash, MD) and does need an [facility] hospital follow-up appointment after discharge.  The patient does not have transportation limitations that hinder transportation to clinic appointments.  .Services Needed at time of discharge: Y = Yes, Blank = No PT:   OT:   RN:   Equipment:   Other:     LOS: 2 days   Lesly Dukes, MD 02/24/2013, 8:47 AM

## 2013-02-24 NOTE — Progress Notes (Signed)
Subjective: [name] reports feeling much better today with easier breathing but continued fatigue. He slept well last night and has not had any chest pain, shortness of breath, pain elsewhere, or other complaints. He was seen by cardiology and electrophysiology yesterday. He desat-ed from 97% to 87% with ambulation down the hallway yesterday and reported SOB at that time which was relieved by O2 via nasal cannula.   Objective: Vital signs in last 24 hours: Filed Vitals:   02/23/13 1509 02/23/13 1655 02/23/13 1956 02/24/13 0543  BP: 105/59 117/60 104/64 139/60  Pulse: 63  60 65  Temp: 97.2 F (36.2 C)  98.2 F (36.8 C) 97.9 F (36.6 C)  TempSrc: Oral  Oral Oral  Resp: _0 Height:      Weight:    78.7 kg (173 lb 8 oz)  SpO2: 100% 96% 99% 98%   Weight change: -3.1 kg (-6 lb 13.4 oz)  Intake/Output Summary (Last 24 hours) at 02/24/13 1610 Last data filed at 02/23/13 2156  Gross per 24 hour  Intake   1010 ml  Output    950 ml  Net     60 ml    Physical Exam Constitutional: He is oriented to person, place, and time. WDWN, NAD. Head: Normocephalic and atraumatic.  Eyes: Conjunctivae and EOM are normal.   Neck: Normal range of motion. Neck supple. No tracheal deviation present.  Cardiovascular: Difficult to auscultate.  Pulmonary/Chest: No increased WOB, no crackles, no rales.  Abdominal: Soft. No distension and no mass.  No ascites.  Musculoskeletal: No LE edema.  Neurological: Appropriately cooperative during interview.   Skin: Skin is warm and dry. Rash (Erythema of the LLE, no swelling or induration. 1cm scrape on medial side of left foot with surrounding erythema. Spots of erythema on big toe.) noted.   Psychiatric: Affect is bright. Memory, affect and judgment normal.   Lab Results:  Recent Labs Lab 02/22/13 1123 02/23/13 0255 02/24/13 0557  NA 140 138 139  K 5.3 5.3 4.5  CL 104 102 101  CO2 _1 GLUCOSE 93 83 83  BUN 24* 25* 29*  CREATININE  1.34 1.44* 1.51*  CALCIUM 9.0 8.8 8.9  GFRNONAA 46* 42* 40*  GFRAA 53* 49* 46*     Recent Labs Lab 02/22/13 1123 02/23/13 0255 02/24/13 0557  WBC 9.0 8.2 6.7  HGB 14.2 12.8* 13.4  HCT 41.2 37.3* 38.9*  PLT 186 169 190    Micro Results: Recent Results (from the past 240 hour(s))  RESPIRATORY VIRUS PANEL     Status: None   Collection Time    02/23/13  4:23 AM      Result Value Range Status   Source - [facility]AN NASAL SWAB   Corrected   Comment: CORRECTED ON 01/08 AT 9604: PREVIOUSLY REPORTED AS NASAL SWAB   Respiratory Syncytial Virus A NOT DETECTED   Final   Respiratory Syncytial Virus B NOT DETECTED   Final   Influenza A NOT DETECTED   Final   Influenza B NOT DETECTED   Final   Parainfluenza 1 NOT DETECTED   Final   Parainfluenza 2 NOT DETECTED   Final   Parainfluenza 3 NOT DETECTED   Final   Metapneumovirus NOT DETECTED   Final   Rhinovirus NOT DETECTED   Final   Adenovirus NOT DETECTED   Final   Influenza A H1 NOT DETECTED   Final   Influenza A H3 NOT DETECTED   Final  Comment: (NOTE)           Normal Reference Range for each Analyte: NOT DETECTED     Testing performed using the Luminex xTAG Respiratory Viral Panel test     kit.     This test was developed and its performance characteristics determined     by [facility]. It has not been cleared or approved by the [place]     Food and Drug Administration. This test is used for clinical purposes.     It should not be regarded as investigational or for research. This     laboratory is certified under the [place] (CLIA) as qualified to perform high complexity     clinical laboratory testing.     Performed at [facility]    Studies/Results: Dg Chest 2 View  02/22/2013   CLINICAL DATA:  Difficulty breathing.  Weakness.  EXAM: CHEST  2 VIEW  COMPARISON:  Chest x-ray 02/03/2013.  FINDINGS: Patchy ill-defined airspace opacities throughout the right lung, and the  left lung base concerning for potential multilobar bronchopneumonia. Relative sparing of the left upper lobe. Small bilateral pleural effusions. Pulmonary vasculature is within normal limits. Heart size appears mildly enlarged. The patient is rotated to the right on today's exam, resulting in distortion of the mediastinal contours and reduced diagnostic sensitivity and specificity for mediastinal pathology. Atherosclerosis in the thoracic aorta. Left-sided pacemaker device in place with lead tips projecting over the expected location of the right atrium and right ventricular free wall.  IMPRESSION: 1. Interval development of patchy multifocal airspace disease in the left lower lobe and throughout the right lung, concerning for developing multilobar bronchopneumonia. 2. Small bilateral pleural effusions. 3. Mild cardiomegaly. 4. Atherosclerosis.   Electronically Signed   By: Vinnie Langton M.D.   On: 02/22/2013 13:12   2D Echo:  - Left ventricle: The cavity size was severely dilated. Systolic function was severely reduced. The estimated ejection fraction was in the range of 20% to 25%. Doppler parameters are consistent with abnormal left ventricular relaxation (grade 1 diastolic dysfunction). - Aortic valve: Mild regurgitation. - Mitral valve: Mild regurgitation. - Left atrium: The atrium was moderately to severely dilated. - Right ventricle: Systolic function was mildly reduced. - Right atrium: The atrium was moderately dilated. - Pulmonary arteries: PA peak pressure: 46m Hg (S).  Medications: I have reviewed the patient's current medications. Scheduled Meds: . amiodarone  100 mg Oral Daily  . atorvastatin  40 mg Oral q1800  . azithromycin  500 mg Oral Daily  . carvedilol  3.125 mg Oral BID WC  . cefTRIAXone (ROCEPHIN)  IV  1 g Intravenous Q24H  . clopidogrel  75 mg Oral Q breakfast  . enoxaparin (LOVENOX) injection  40 mg Subcutaneous Q24H  . famotidine  20 mg Oral QHS  . finasteride  5 mg  Oral Daily  . furosemide  40 mg Oral Daily  . levothyroxine  50 mcg Oral QAC breakfast  . mupirocin cream   Topical BID  . omega-3 acid ethyl esters  1 g Oral Daily  . polyethylene glycol  17 g Oral Daily   Continuous Infusions:  PRN Meds:.albuterol, nitroGLYCERIN, sodium chloride   Assessment/Plan: Principal Problem:   Acute on chronic clinical systolic heart failure Active Problems:   CAD (coronary artery disease)   Abdominal aneurysm without mention of rupture   Bronchopneumonia   SOB (shortness of breath)   CAP (community acquired pneumonia)  Cellulitis of left lower extremity  Francene Castle. Krogh is a 80yo male with a PMH of HTN, multivessel CAD s/p LHC and multivessel stenting (left circumflex, LAD, obtuse marginal 3) in 12/2011 by Dr. Terrence Dupont, tachy-brady syndrome with symptomatic bradycardia s/p pacemaker placed in 11/2011, paroxysmal atrial fibrillation, CKD stage 3, Hypothyroidism, asymptomatic AAA followed by [facility], who presents with worsening SOB for the last 2-3 weeks.  # Community-acquired pneumonia - CXR shows patchy multifocal airspace disease in LLL and throughout right lung, concerning for developing multilobar bronchopneumonia. Remains afebrile, not tachycardic, satting well on room air. Flu panel was negative. HIV NR. Strep pneumo urinary antigen negative. Urine legionella antigen negative. BMP this morning shows BUN increased to 29 (from 25) and Creatinine increased to 1.51 (from 1.44). CBC WBC, Hb, plt wnl.  - Continue Azithromycin 548m IV daily and Ceftriaxone 1g IV daily, with plan to transition to PO Levoquin  - Albuterol nebs q6h prn shortness of breath  - Continuous pulse oximetry  - Given increase in Cr 1.44 --> 1.51 - continue to hold ARB - Repeat BMP in AM - Blood cultures x2 - pending  #Acute systolic heart failure - 2D echo interpreted by his PCP 02/22/13 showed EF of 25-30%. Pro-BNP elevated at 6923. 2D echo in 11/2011 showed  normal systolic function, EF 536-14% Repeated 2D Echo on 02/23/13 showed EF of 20-25%, LV severely dilated with LA moderate-severely dilated, Grade 1 diastolic dysfunction, AV and MV mild regurgitation.  - Appreciate cardiology recs, Dr. HTerrence Dupont - EP consult (Dr. ARayann Heman [facility]) for pacemaker interrogation - patient without chronic atrial fibrillation. Patient does not need chronic anticoagulation at this time. - Nuclear stress test on Saturday to evaluate for ischemia in view of new CHF and markedly depressed LV systolic function, per Dr. HTerrence Dupont - Continue Lasix 417mpo daily  - Continue Coreg 3.125 mg BID  - Monitor renal function closely, will add ACE/ARB as renal function and blood pressure permits  - Daily weights  - Strict ins and outs > Net +60cc yesterday  Filed Weights   02/22/13 2011 02/23/13 0207 02/24/13 0543  Weight: 81.8 kg (180 lb 5.4 oz) 81.4 kg (179 lb 7.3 oz) 78.7 kg (173 lb 8 oz)   #EKG abnormality, with history of multivessel CAD - Patient has a history of multivessel CAD s/p LHC and multivessel stenting in 2013. Admission EKG showed new TWI in inferior leads and V3. Troponins were cycled and negative x3. Repeated EKG with improvement in inferior TWI, but new lateral TWI. Difficult to interpret significance given paced rhythm.  - EP consult as above  - SL NG 0.58m13m5mi28mrn chest pain  - Continue home Plavix 75mg758mly   #LLE cellulitis - Patient is supposedly s/p 14 days of Keflex. Still with erythema of calf, foot. Wound care saw him, no treatment recommended.  - Bactroban cream BID   #Hypertension - Currently well controlled.  - Change home Lopressor 25 mg BID to Coreg 3.125 mg BID per Dr. HarwaTerrence Dupontolding home olmesartan-HCTZ 40-12.5 mg daily, will continue to hold as creatinine increased to 1.51. Will consider adding back tomorrow as renal function and blood pressure permits.  #Paroxysmal atrial fibrillation - Patient is in a paced rhythm currently.  -  Continue home amiodarone 100mg 67my  - EP consult as above   #Hypothyroidism - Stable. TSH of 8.507. - Continue home synthroid 50mcg 558my.  - Free T4 pending  #HLD  - Continue Lipitor  32m daily and Lovaza 1g daily.   #GERD  - Continue Famotidine to 289mdaily which is renal dosing.   #BPH  - Continue home finasteride 55m555maily.   #Asymptomatic AAA - Patient last saw [facility] in the clinic on 03/07/12. Duplex ultrasound at that time showed a maximum diameter of 4.81 AP by 4.84 transverse, slight increase from January 2012 when he was 4.4 x 4.5. Repeat duplex on 09/07/12 showed no significant change.   #DVT PPX - Lovenox subq and SCDs   #Code status - Patient would like to be DNI, but he *would* like chest compressions/defibrillation. Partial code status updated in EPIC.  Dispo: Disposition is deferred at this time, awaiting improvement of current medical problems. Anticipated discharge in approximately 1-3 day(s).  The patient does have a current PCP (Richard M DMarcia BrashD) and [facility].  The patient does not have transportation limitations that hinder transportation to clinic appointments. Per PT consult the patient needs outpatient HH [facility] after discharge and cane for assistance. Pt declines HH PT and already has a cane.  Contacts/follow up:  This is a [employer] information officerte.  The care of the patient was discussed with Dr. RogStann Mainlandd the assessment and plan formulated with their assistance.  Please see their attached note for official documentation of the daily encounter.   LOS: 2 days   KimMaryclare Beaned Student 02/24/2013, 7:07 AM

## 2013-02-24 NOTE — Discharge Instructions (Signed)
It was a pleasure taking care of you. - Please follow up with Dr. Janna Arch as scheduled above for hospital follow up. - Please also follow up with your cardiologist, Dr. [name], as scheduled. - Please continue your antibiotics as prescribed for your pneumonia.  - If you develop fevers, chills, shortness of breath, chest pain please return to the ED.

## 2013-02-24 NOTE — Progress Notes (Addendum)
Pt o4x, no complaints of SOB. 96% on 2l Rn move to 1l . Pt stat 91% on 1L after ambulation in room. Put back on 2l. Will continue to monitor

## 2013-02-24 NOTE — Progress Notes (Signed)
Subjective:  Patient denies any chest pains states breathing is markedly improved. Review 2-D echo showed markedly depressed LV systolic function with EF of approximately 20%. Discussed with patient and family regarding various causes of depressed LV systolic function i.e. silent MI in last 1 year versus ischemic hibernating myocardium versus pacemaker induced. Discussed with patient regarding nuclear stress test and agrees and will consider left heart cath only if shows large area of myocardium at risk in view chronic kidney disease. We'll need to stage the procedure if required invasive PCI.   Objective:  Vital Signs in the last 24 hours: Temp:  [97.2 F (36.2 C)-98.2 F (36.8 C)] 97.6 F (36.4 C) (01/09 0949) Pulse Rate:  [60-73] 64 (01/09 0949) Resp:  [20] 20 (01/09 0543) BP: (103-139)/(50-75) 103/50 mmHg (01/09 0949) SpO2:  [88 %-100 %] 95 % (01/09 0949) Weight:  [78.7 kg (173 lb 8 oz)] 78.7 kg (173 lb 8 oz) (01/09 0543)  Intake/Output from previous day: 01/08 0701 - 01/09 0700 In: 1010 [P.O.:960; IV Piggyback:50] Out: 950 [Urine:950] Intake/Output from this shift: Total I/O In: 600 [P.O.:600] Out: 225 [Urine:225]  Physical Exam: Neck: no adenopathy, no carotid bruit, no JVD and supple, symmetrical, trachea midline Lungs: Decrease vessel on at bases with occasional rhonchi air entry improved Heart: regular rate and rhythm, S1, S2 normal and Soft diastolic murmur and S3 gallop noted Abdomen: soft, non-tender; bowel sounds normal; no masses,  no organomegaly Extremities: extremities normal, atraumatic, no cyanosis or edema  Lab Results:  Recent Labs  02/23/13 0255 02/24/13 0557  WBC 8.2 6.7  HGB 12.8* 13.4  PLT 169 190    Recent Labs  02/23/13 0255 02/24/13 0557  NA 138 139  K 5.3 4.5  CL 102 101  CO2 25 23  GLUCOSE 83 83  BUN 25* 29*  CREATININE 1.44* 1.51*    Recent Labs  02/22/13 2125 02/23/13 0255  TROPONINI <0.30 <0.30   Hepatic Function  Panel  Recent Labs  02/22/13 1123  PROT 6.6  ALBUMIN 3.5  AST 17  ALT 7  ALKPHOS 83  BILITOT 1.0   No results found for this basename: CHOL,  in the last 72 hours No results found for this basename: PROTIME,  in the last 72 hours  Imaging: Imaging results have been reviewed and No results found.  Cardiac Studies:  Assessment/Plan:  Resolving Acute decompensated systolic heart failure with markedly depressed LV systolic function which is new as compared to prior echo, rule out ischemia rule MI ruled out  Coronary artery disease history of silent MI in the past with occluded RCA  Multivessel CAD status post PCI to LAD left circumflex and OM 3 as above in November 2013  Probable multilobar bilateral pneumonia  Tachybradycardia syndrome status post permanent pacemaker  History of paroxysmal A. fib  Hypertension  Hypercholesteremia  Hypothyroidism  Chronic kidney disease stage II  Degenerative joint disease  Remote tobacco abuse  Abdominal aortic aneurysm Plan Continue present management Scheduled for nuclear stress test for tomorrow. May need CRT/ ? B/ V. ICD if no evidence of ischemia and LV function remains depressed despite maximizing beta blockers and ACE inhibitors.   LOS: 2 days    Connor Diaz N 02/24/2013, 12:51 PM

## 2013-02-24 NOTE — Progress Notes (Signed)
Physical Therapy Treatment Patient Details Name: Connor Diaz MRN: [id number] DOB: 04/23/1923 Today's Date: 02/24/2013 Time: 1350-1410 PT Time Calculation (min): 20 min  PT Assessment / Plan / Recommendation  History of Present Illness Connor Diaz is a 80 y.o. male with PAF, tachy-brady syndrome s/p PPM implant ([employer]) October 2013, CAD s/p PCI to LAD, LCx and OM November 2013, previously normal LVEF by echo 2013 and CKD who was admitted yesterday with worsening dyspnea, found to have acute systolic HF and PNA. Connor Diaz reports DOE with progressive worsening for 3 weeks   PT Comments   Not steady enough to use cane alone, but better with walker.  States has walker already at home.  Feels he can manage his rehab on his own at home rather than having HHPT.  History of going to [facility], so likely may not need outpatient PT if recovers.  Highly recommend nursing staff to walk with patient 2x daily checking O2 in hallway to allow quick transition home when stable.  Follow Up Recommendations  Outpatient PT     Does the patient have the potential to tolerate intense rehabilitation   N/a  Barriers to Discharge  None      Equipment Recommendations  None recommended by PT    Recommendations for Other Services  None  Frequency Min 3X/week   Progress towards PT Goals Progress towards PT goals: Progressing toward goals  Plan Current plan remains appropriate    Precautions / Restrictions Precautions Precautions: Fall   Pertinent Vitals/Pain 91% amb on 2L O2    Mobility  Bed Mobility Overal bed mobility: Modified Independent Transfers Overall transfer level: Modified independent Ambulation/Gait Ambulation/Gait assistance: Min guard;Supervision Ambulation Distance (Feet): 200 Feet Assistive device: Rolling walker (2 wheeled);Straight cane Gait Pattern/deviations: Step-through pattern;Drifts right/left;Decreased stride length General Gait Details: pt SOB sitting up to  EOB so maintained on O2 with mobility; started with [facility] and pt continued to be unsteady needing assist for safety, so used walker for second half (100') back to room with supervision only for safety    Exercises General Exercises - Lower Extremity Long Arc Quad: AROM;Seated;Both;20 reps Hip Flexion/Marching: AROM;Seated;Both;20 reps Toe Raises: AROM;Seated;Both;20 reps Heel Raises: AROM;Seated;Both;20 reps    PT Goals (current goals can now be found in the care plan section)    Visit Information  Last PT Received On: 02/24/13 Assistance Needed: +1 History of Present Illness: Connor Diaz is a 80 y.o. male with PAF, tachy-brady syndrome s/p PPM implant ([employer]) October 2013, CAD s/p PCI to LAD, LCx and OM November 2013, previously normal LVEF by echo 2013 and CKD who was admitted yesterday with worsening dyspnea, found to have acute systolic HF and PNA. Connor Diaz reports DOE with progressive worsening for 3 weeks    Subjective Data      Cognition  Cognition Arousal/Alertness: Awake/alert Behavior During Therapy: WFL for tasks assessed/performed Overall Cognitive Status: Within Functional Limits for tasks assessed    Balance  Balance Overall balance assessment: Needs assistance Standing balance support: No upper extremity supported Standing balance-Leahy Scale: Fair Standing balance comment: supervision for safety in hospital enviroment moving to chair without walker, pushing walker out of the way  End of Session PT - End of Session Equipment Utilized During Treatment: Gait belt;Oxygen Activity Tolerance: Patient tolerated treatment well Patient left: with family/visitor present;in chair;with call bell/phone within reach   GP     [facility] 02/24/2013, 2:16 PM [place], [place] [phone] 02/24/2013

## 2013-02-25 ENCOUNTER — Inpatient Hospital Stay (HOSPITAL_COMMUNITY): Payer: Medicare Other

## 2013-02-25 ENCOUNTER — Encounter (HOSPITAL_COMMUNITY): Payer: Self-pay | Admitting: Internal Medicine

## 2013-02-25 LAB — BASIC METABOLIC PANEL
BUN: 32 mg/dL — ABNORMAL HIGH (ref 6–23)
CALCIUM: 8.4 mg/dL (ref 8.4–10.5)
CO2: 22 mEq/L (ref 19–32)
CREATININE: 1.46 mg/dL — AB (ref 0.50–1.35)
Chloride: 102 mEq/L (ref 96–112)
GFR calc non Af Amer: 41 mL/min — ABNORMAL LOW (ref >90)
GFR, EST AFRICAN AMERICAN: 48 mL/min — AB (ref >90)
Glucose, Bld: 86 mg/dL (ref 70–99)
Potassium: 4.2 mEq/L (ref 3.7–5.3)
SODIUM: 137 meq/L (ref 137–147)

## 2013-02-25 MED ORDER — RAMIPRIL 2.5 MG PO CAPS
2.5000 mg | ORAL_CAPSULE | Freq: Every day | ORAL | Status: DC
  Administered 2013-02-25: 2.5 mg via ORAL
  Filled 2013-02-25: qty 1

## 2013-02-25 MED ORDER — TECHNETIUM TC 99M SESTAMIBI GENERIC - CARDIOLITE
10.0000 | Freq: Once | INTRAVENOUS | Status: AC | PRN
  Administered 2013-02-25: 10 via INTRAVENOUS

## 2013-02-25 MED ORDER — MUPIROCIN 2 % EX OINT: 1.0000 "application " | TOPICAL_OINTMENT | Freq: Two times a day (BID) | CUTANEOUS | Status: DC

## 2013-02-25 MED ORDER — CARVEDILOL 3.125 MG PO TABS
3.1250 mg | ORAL_TABLET | Freq: Two times a day (BID) | ORAL | Status: DC
  Administered 2013-02-25: 3.125 mg via ORAL
  Filled 2013-02-25 (×2): qty 1

## 2013-02-25 MED ORDER — REGADENOSON 0.4 MG/5ML IV SOLN
0.4000 mg | Freq: Once | INTRAVENOUS | Status: AC
  Administered 2013-02-25: 0.4 mg via INTRAVENOUS
  Filled 2013-02-25: qty 5

## 2013-02-25 MED ORDER — CARVEDILOL 3.125 MG PO TABS: 3.1250 mg | ORAL_TABLET | Freq: Two times a day (BID) | ORAL | Status: DC

## 2013-02-25 MED ORDER — FUROSEMIDE 40 MG PO TABS: 40.0000 mg | ORAL_TABLET | Freq: Every day | ORAL | Status: DC

## 2013-02-25 MED ORDER — AZITHROMYCIN 500 MG PO TABS: 500.0000 mg | ORAL_TABLET | Freq: Every day | ORAL | Status: DC

## 2013-02-25 MED ORDER — TECHNETIUM TC 99M SESTAMIBI - CARDIOLITE
30.0000 | Freq: Once | INTRAVENOUS | Status: AC | PRN
Start: 2013-02-25 — End: 2013-02-25
  Administered 2013-02-25: 11:00:00 30 via INTRAVENOUS

## 2013-02-25 MED ORDER — REGADENOSON 0.4 MG/5ML IV SOLN
INTRAVENOUS | Status: AC
  Administered 2013-02-25: 10:00:00 0.4 mg via INTRAVENOUS
  Filled 2013-02-25: qty 5

## 2013-02-25 MED ORDER — RAMIPRIL 2.5 MG PO CAPS: 2.5000 mg | ORAL_CAPSULE | Freq: Every day | ORAL | Status: DC

## 2013-02-25 NOTE — Progress Notes (Addendum)
Subjective: Pt feeling better today, denies sob.  He is sitting in recliner with family at bedside.  We are waiting on the results of stress test.     Objective: Vital signs in last 24 hours: Filed Vitals:   02/25/13 0954 02/25/13 0956 02/25/13 0958 02/25/13 1112  BP: 128/69 127/66 137/61 141/63  Pulse:    63  Temp:    97.3 F (36.3 C)  TempSrc:    Oral  Resp:    18  Height:      Weight:      SpO2:    92%   Weight change: 11.2 oz (0.317 kg)  Intake/Output Summary (Last 24 hours) at 02/25/13 1211 Last data filed at 02/25/13 0944  Gross per 24 hour  Intake    410 ml  Output   1525 ml  Net  -1115 ml   Vitals reviewed. General: resting in bed, NAD HEENT: [place]/at, no scleral icterus Cardiac: RRR, no rubs, murmurs or gallops Pulm: clear to auscultation bilaterally, no wheezes, rales, or rhonchi. On [place] Abd: soft, nontender, nondistended, BS present Ext: warm and well perfused, no pedal edema Neuro: alert and oriented X3, cranial nerves II-XII grossly intact  Lab Results: Basic Metabolic Panel:  Recent Labs Lab 02/22/13 1636  02/24/13 0557 02/25/13 0455  NA  --   < > 139 137  K  --   < > 4.5 4.2  CL  --   < > 101 102  CO2  --   < > 23 22  GLUCOSE  --   < > 83 86  BUN  --   < > 29* 32*  CREATININE  --   < > 1.51* 1.46*  CALCIUM  --   < > 8.9 8.4  MG 2.1  --   --   --   < > = values in this interval not displayed. Liver Function Tests:  Recent Labs Lab 02/22/13 1123  AST 17  ALT 7  ALKPHOS 83  BILITOT 1.0  PROT 6.6  ALBUMIN 3.5   CBC:  Recent Labs Lab 02/23/13 0255 02/24/13 0557  WBC 8.2 6.7  NEUTROABS 4.2  --   HGB 12.8* 13.4  HCT 37.3* 38.9*  MCV 99.5 98.5  PLT 169 190   Cardiac Enzymes:  Recent Labs Lab 02/22/13 1608 02/22/13 2125 02/23/13 0255  TROPONINI <0.30 <0.30 <0.30   BNP:  Recent Labs Lab 02/22/13 1123  PROBNP 6923.0*   Thyroid Function Tests:  Recent Labs Lab 02/23/13 0255 02/24/13 0557  TSH 8.507*  --   FREET4   --  1.34   Misc. Labs: None   Micro Results: Recent Results (from the past 240 hour(s))  CULTURE, BLOOD (ROUTINE X 2)     Status: None   Collection Time    02/22/13  4:46 PM      Result Value Range Status   Specimen Description BLOOD ARM LEFT   Final   Special Requests BOTTLES DRAWN AEROBIC AND ANAEROBIC 10CC   Final   Culture  Setup Time     Final   Value: 02/23/2013 00:26     Performed at [facility]   Culture     Final   Value:        BLOOD CULTURE RECEIVED NO GROWTH TO DATE CULTURE WILL BE HELD FOR 5 DAYS BEFORE ISSUING A FINAL NEGATIVE REPORT     Performed at [facility]   Report Status PENDING   Incomplete  CULTURE, BLOOD (ROUTINE  X 2)     Status: None   Collection Time    02/22/13  4:52 PM      Result Value Range Status   Specimen Description BLOOD HAND LEFT   Final   Special Requests BOTTLES DRAWN AEROBIC ONLY 10CC   Final   Culture  Setup Time     Final   Value: 02/23/2013 00:25     Performed at [facility]   Culture     Final   Value:        BLOOD CULTURE RECEIVED NO GROWTH TO DATE CULTURE WILL BE HELD FOR 5 DAYS BEFORE ISSUING A FINAL NEGATIVE REPORT     Performed at [facility]   Report Status PENDING   Incomplete  RESPIRATORY VIRUS PANEL     Status: None   Collection Time    02/23/13  4:23 AM      Result Value Range Status   Source - RVPAN NASAL SWAB   Corrected   Comment: CORRECTED ON 01/08 AT 1740: PREVIOUSLY REPORTED AS NASAL SWAB   Respiratory Syncytial Virus A NOT DETECTED   Final   Respiratory Syncytial Virus B NOT DETECTED   Final   Influenza A NOT DETECTED   Final   Influenza B NOT DETECTED   Final   Parainfluenza 1 NOT DETECTED   Final   Parainfluenza 2 NOT DETECTED   Final   Parainfluenza 3 NOT DETECTED   Final   Metapneumovirus NOT DETECTED   Final   Rhinovirus NOT DETECTED   Final   Adenovirus NOT DETECTED   Final   Influenza A H1 NOT DETECTED   Final   Influenza A H3 NOT DETECTED   Final   Comment: (NOTE)            Normal Reference Range for each Analyte: NOT DETECTED     Testing performed using the Luminex xTAG Respiratory Viral Panel test     kit.     This test was developed and its performance characteristics determined     by [facility]. It has not been cleared or approved by the [place]     Food and Drug Administration. This test is used for clinical purposes.     It should not be regarded as investigational or for research. This     laboratory is certified under the [place] (CLIA) as qualified to perform high complexity     clinical laboratory testing.     Performed at [facility]   Studies/Results: No results found. Medications:  Scheduled Meds: . amiodarone  100 mg Oral Daily  . atorvastatin  40 mg Oral q1800  . azithromycin  500 mg Oral Daily  . carvedilol  3.125 mg Oral BID WC  . cefTRIAXone (ROCEPHIN)  IV  1 g Intravenous Q24H  . clopidogrel  75 mg Oral Q breakfast  . enoxaparin (LOVENOX) injection  40 mg Subcutaneous Q24H  . famotidine  20 mg Oral QHS  . finasteride  5 mg Oral Daily  . furosemide  40 mg Oral Daily  . levothyroxine  50 mcg Oral QAC breakfast  . mupirocin cream   Topical BID  . omega-3 acid ethyl esters  1 g Oral Daily  . polyethylene glycol  17 g Oral Daily  . ramipril  2.5 mg Oral Daily   Continuous Infusions:  PRN Meds:.albuterol, nitroGLYCERIN, sodium chloride Assessment/Plan: 80 y.o PMH AAA, HTN, hypothyroidism, CKD, Atrial fibrillation,  h/o tacy-brady s/p device, CAD (on Plavix) presenting with acute respiratory failure with hypoxia on 1/7 (improved) found to have acute systolic heart failure and community acquired pneumonia.    1. 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 Doppler parameters are consistent with abnormal left ventricular relaxation (grade 1 diastolic dysfunction), aortic mild regurg, MV mild regurg, left atrium:  The atrium was moderately to severely dilated, right atrium: The atrium was moderately dilated, Pulmonary arteries: PA peak pressure: 50m Hg (S -Nuclear stress today read with no evidence of significant inducible myocardial ischemia. Extensive scar is identified related to prior infarction. There may be a tiny area of reversibility along the margin of prior anterior  wall infarction. Left ventricular dysfunction with quantitative ejection fraction calculation of 31%. Significant hypokinesis is present involving the inferior wall, septum and apex. -Pt can be d/c'ed per Dr. HTerrence Dupontw/ outpatient f/u with him and PCP Dr. DDerrick RavelNC -Pt is on Lasix 40 mg qd, Coreg 3.125 mg bid, Ramipril 2.5 mg qd, statin -will d/c Benicar PTA and keep the pt on current meds above   2. Bronchopneumonia likely 2/2 CAP -On Rocephin and AZM since 1/7.  Blood cultures NTD. Will likely d/c with AZM x total 7 days -will ambulate with pulse Ox again today to see if needs home O2  -pt has proair prn at home   3. Cellulitis of left lower extremity -just tx'ed with 14 days of Keflex PTA.  Follows with Dr. HNevada Crane(Dermatology) outpatient  4. History of Atrial fibrillation  -device was interrogated this admission w/o AF -Continued Amiodarone   5. F/E/N -NSL -will need repeat BMET outpatient  -cardiac diet   6. DVT px  -scds, on Plavix     Dispo: Disposition is deferred at this time, awaiting improvement of current medical problems.  Anticipated discharge in approximately 0-1 day(s).   The patient does have a current PCP (Richard MMarcia Brash MD) and [facility].  The patient does not have transportation limitations that hinder transportation to clinic appointments.  .Services Needed at time of discharge: Y = Yes, Blank = No PT:   OT:   RN:   Equipment:   Other:     LOS: 3 days   TCresenciano Genre MD [phone]/11/2013, 12:11 PM

## 2013-02-25 NOTE — Progress Notes (Signed)
   CARE MANAGEMENT NOTE 02/25/2013  Patient:  BRANDY, MILUM   Account Number:  [id number]  Date Initiated:  02/24/2013  Documentation initiated by:  [facility]  Subjective/Objective Assessment:   80 y.o. male with PMH HTN, multivessel coronary artery disease s/p LHC and multivessel stenting; presents with shortness of breath.//Home [phone]( [phone]) [id number])     Action/Plan:   Pacemaker interrogation, diurese//Home with HH   Anticipated DC Date:  02/26/2013   Anticipated DC Plan:  HOME W HOME HEALTH SERVICES      DC Planning Services  CM consult      Choice offered to / List presented to:     DME arranged  [facility]      DME agency  [facility]     [facility] arranged  HH - 11 Patient Refused      Status of service:  Completed, signed off Medicare Important Message given?   (If response is "NO", the following Medicare IM given date fields will be blank) Date Medicare IM given:   Date Additional Medicare IM given:    Discharge Disposition:  HOME/SELF CARE  Per UR Regulation:    If discussed at Long Length of Stay Meetings, dates discussed:    Comments:  02/26/1515:30  CM called [facility] for delivery of 02 to room prior to discharge.  [facility] asked if CHF was chronic.  CM called MD on call at [phone] (in for [facility]) to add comment to 02 order for HH oxygen.  No other CM needs were communicated.  Freddy Jaksch, BSN, [place] [phone].  02/24/13 1515 Camellia Lucretia Roers, RN, BSN, [employer] [phone] I have recommended that this patient have Home Health services but he declines at this time. I have discussed the benefits of Home Health with him. The patient verbalizes understanding.  States he utilizes the pool at the [facility] 3-4 times a week and his wife takes very good care of him. Will continue to follow.

## 2013-02-25 NOTE — Progress Notes (Signed)
Agree with medical student Vuong note.  See my note for more details.   Shirlee Latch MD

## 2013-02-25 NOTE — Progress Notes (Signed)
Subjective: Connor Diaz is in good spirits and reports feeling much better with improved breathing. Denies chest pain, SOB at rest, or any other complaints. Case Management recommended HH but patient refused because his wife is his primary caretaker and he regularly participates in water exercises at the [facility]. Last PM patient de-sated from 96% on 2L to 91% on 1L after ambulation in room, and placed back on 2L. Stress test was performed today with cardiology, patient tolerated this well and without complaints.   Objective: Vital signs in last 24 hours: Filed Vitals:   02/25/13 0956 02/25/13 0958 02/25/13 1112 02/25/13 1222  BP: 127/66 137/61 141/63 127/62  Pulse:   63   Temp:   97.3 F (36.3 C)   TempSrc:   Oral   Resp:   18   Height:      Weight:      SpO2:   92%    Weight change: 0.317 kg (11.2 oz)  Intake/Output Summary (Last 24 hours) at 02/25/13 1256 Last data filed at 02/25/13 0944  Gross per 24 hour  Intake    410 ml  Output   1525 ml  Net  -1115 ml    ROS: See HPI. Otherwise ROS negative.  Physical Exam  Constitutional: WDWN, NAD.  Head: Normocephalic and atraumatic.  Eyes: Conjunctivae and EOM are normal.  Neck: Normal ROM. Neck supple. No tracheal deviation present.  Cardiovascular: Difficult to auscultate.  Pulmonary/Chest: No increased WOB, no crackles, no rales.   Abdominal: Soft. No distension and no mass. No ascites.  Musculoskeletal: No LE edema.  Neurological: Appropriately cooperative during interview.  Skin: Skin is warm and dry. Erythema of LLE, no swelling or induration, 1 cm scrape on medial side of left foot with surrounding erythema. Spots of erythema on big toe. Darkened discoloration of dorsal surface of hands bilaterally.  Psychiatric: Affect is bright. Memory, affect and judgment normal.   Lab Results:  Recent Labs Lab 02/23/13 0255 02/24/13 0557 02/25/13 0455  NA 138 139 137  K 5.3 4.5 4.2  CL 102 101 102  CO2 _0 GLUCOSE 83 83  86  BUN 25* 29* 32*  CREATININE 1.44* 1.51* 1.46*  CALCIUM 8.8 8.9 8.4  GFRNONAA 42* 40* 41*  GFRAA 49* 46* 48*     Recent Labs Lab 02/22/13 1123 02/23/13 0255 02/24/13 0557  WBC 9.0 8.2 6.7  HGB 14.2 12.8* 13.4  HCT 41.2 37.3* 38.9*  PLT 186 169 190    Micro Results: Recent Results (from the past 240 hour(s))  CULTURE, BLOOD (ROUTINE X 2)     Status: None   Collection Time    02/22/13  4:46 PM      Result Value Range Status   Specimen Description BLOOD ARM LEFT   Final   Special Requests BOTTLES DRAWN AEROBIC AND ANAEROBIC 10CC   Final   Culture  Setup Time     Final   Value: 02/23/2013 00:26     Performed at [facility]   Culture     Final   Value:        BLOOD CULTURE RECEIVED NO GROWTH TO DATE CULTURE WILL BE HELD FOR 5 DAYS BEFORE ISSUING A FINAL NEGATIVE REPORT     Performed at [facility]   Report Status PENDING   Incomplete  CULTURE, BLOOD (ROUTINE X 2)     Status: None   Collection Time    02/22/13  4:52 PM      Result  Value Range Status   Specimen Description BLOOD HAND LEFT   Final   Special Requests BOTTLES DRAWN AEROBIC ONLY 10CC   Final   Culture  Setup Time     Final   Value: 02/23/2013 00:25     Performed at [facility]   Culture     Final   Value:        BLOOD CULTURE RECEIVED NO GROWTH TO DATE CULTURE WILL BE HELD FOR 5 DAYS BEFORE ISSUING A FINAL NEGATIVE REPORT     Performed at [facility]   Report Status PENDING   Incomplete  RESPIRATORY VIRUS PANEL     Status: None   Collection Time    02/23/13  4:23 AM      Result Value Range Status   Source - RVPAN NASAL SWAB   Corrected   Comment: CORRECTED ON 01/08 AT 1740: PREVIOUSLY REPORTED AS NASAL SWAB   Respiratory Syncytial Virus A NOT DETECTED   Final   Respiratory Syncytial Virus B NOT DETECTED   Final   Influenza A NOT DETECTED   Final   Influenza B NOT DETECTED   Final   Parainfluenza 1 NOT DETECTED   Final   Parainfluenza 2 NOT DETECTED   Final    Parainfluenza 3 NOT DETECTED   Final   Metapneumovirus NOT DETECTED   Final   Rhinovirus NOT DETECTED   Final   Adenovirus NOT DETECTED   Final   Influenza A H1 NOT DETECTED   Final   Influenza A H3 NOT DETECTED   Final   Comment: (NOTE)           Normal Reference Range for each Analyte: NOT DETECTED     Testing performed using the Luminex xTAG Respiratory Viral Panel test     kit.     This test was developed and its performance characteristics determined     by [facility]. It has not been cleared or approved by the [place]     Food and Drug Administration. This test is used for clinical purposes.     It should not be regarded as investigational or for research. This     laboratory is certified under the [place] (CLIA) as qualified to perform high complexity     clinical laboratory testing.     Performed at [facility]    Studies/Results: Nm Myocar Multi W/spect W/wall Motion / Ef  02/25/2013   CLINICAL DATA:  Chest pain and history of coronary artery disease with prior myocardial infarction, percutaneous coronary intervention and history of heart failure.  EXAM: MYOCARDIAL IMAGING WITH SPECT (REST AND PHARMACOLOGIC-STRESS)  GATED LEFT VENTRICULAR WALL MOTION STUDY  LEFT VENTRICULAR EJECTION FRACTION  TECHNIQUE: Standard myocardial SPECT imaging was performed after resting intravenous injection of 10 mCi Tc-62msestamibi. Subsequently, intravenous infusion of Lexiscan was performed under the supervision of the Cardiology staff. At peak effect of the drug, 30 mCi Tc-976mestamibi was injected intravenously and standard myocardial SPECT imaging was performed. Quantitative gated imaging was also performed to evaluate left ventricular wall motion, and estimate left ventricular ejection fraction.  COMPARISON:  None.  FINDINGS: Utilizing gated data, the end-diastolic volume is estimated to be 196 mL and the end systolic volume 13229L.  Calculated ejection fraction is 31%.  Gated wall motion analysis shows significant hypokinesis involving the inferior wall, septum and left ventricular apex. Milder hypokinesis is present involving the anterior and lateral  walls. No overt akinetic or dyskinetic segments are identified.  SPECT imaging demonstrates significant scar involving most of the inferior wall, the inferolateral wall, the lower septum, the distal anterior wall and the apex. No significant inducible myocardial ischemia is identified. There may be a small region of reversibility of the mid anterior wall towards the septum at the margin of prior infarction. This is a tiny area of potential reversibility.  IMPRESSION: 1. No evidence of significant inducible myocardial ischemia. Extensive scar is identified related to prior infarction. There may be a tiny area of reversibility along the margin of prior anterior wall infarction. 2. Left ventricular dysfunction with quantitative ejection fraction calculation of 31%. Significant hypokinesis is present involving the inferior wall, septum and apex.   Electronically Signed   By: Aletta Edouard M.D.   On: 02/25/2013 12:45    Stress Test results pending   Medications: I have reviewed the patient's current medications. Scheduled Meds: . amiodarone  100 mg Oral Daily  . atorvastatin  40 mg Oral q1800  . azithromycin  500 mg Oral Daily  . carvedilol  3.125 mg Oral BID WC  . cefTRIAXone (ROCEPHIN)  IV  1 g Intravenous Q24H  . clopidogrel  75 mg Oral Q breakfast  . enoxaparin (LOVENOX) injection  40 mg Subcutaneous Q24H  . famotidine  20 mg Oral QHS  . finasteride  5 mg Oral Daily  . furosemide  40 mg Oral Daily  . levothyroxine  50 mcg Oral QAC breakfast  . mupirocin cream   Topical BID  . omega-3 acid ethyl esters  1 g Oral Daily  . polyethylene glycol  17 g Oral Daily  . ramipril  2.5 mg Oral Daily   Continuous Infusions:  PRN Meds:.albuterol, nitroGLYCERIN, sodium  chloride   Assessment/Plan: Principal Problem:   Acute systolic heart failure Active Problems:   CAD (coronary artery disease)   Abdominal aneurysm without mention of rupture   Bronchopneumonia   SOB (shortness of breath)   CAP (community acquired pneumonia)   Cellulitis of left lower extremity   Respiratory failure with hypoxia  Connor Diaz is a 80yo male with a PMH of HTN, multivessel CAD s/p LHC and multivessel stenting (left circumflex, LAD, obtuse marginal 3) in 12/2011 by Dr. Terrence Dupont, tachy-brady syndrome with symptomatic bradycardia s/p pacemaker placed in 11/2011, paroxysmal atrial fibrillation, CKD stage 3, Hypothyroidism, asymptomatic AAA followed by [facility], who presents with worsening SOB for the last 2-3 weeks.   # Community-acquired pneumonia - CXR shows patchy multifocal airspace disease in LLL and throughout right lung, concerning for developing multilobar bronchopneumonia. Remains afebrile, not tachycardic, satting well on 2L O2. Negative Flu panel, strep pneumo, legionella. HIV NR. BMP this morning improved with creatinine 1.44 --> 1.51 --> 1.46 and BUN 25 --> 29 --> 32.   - Continue Azithromycin 511m PO daily and Ceftriaxone 1g IV daily, with plan to transition to PO Azithryomycin at discharge until 03/01/13  - Albuterol nebs q6h prn SOB - Continuous pulse oximetry and 2L O2 via nasal cannula - Will have nursing repeat test for O2 requirement and check for desat with ambulation - Blood cultures x2 - pending   #Acute systolic heart failure - Repeated 2D Echo on 02/23/13 showed EF of 20-25%, LV severely dilated with LA moderate-severely dilated, Grade 1 diastolic dysfunction, AV and MV mild regurgitation. EP consult (Dr. ARayann Heman [facility]) for pacemaker interrogation - No chronic afib, no chronic anticoagulation needed - Appreciate cardiology recs, Dr. HTerrence Dupont-  cardiologically stable since no ischemia on nuclear imaging from stress test   -  Continue Lasix 10m po daily  - Restart Coreg 3.125 mg BID (AM dose not given due to stress test) - Repeat BMP in AM if not discharged today  - Strict ins and outs > Net -390 cc yesterday  - Daily weights Filed Weights   02/23/13 0207 02/24/13 0543 02/25/13 0500  Weight: 81.4 kg (179 lb 7.3 oz) 78.7 kg (173 lb 8 oz) 79.017 kg (174 lb 3.2 oz)   #EKG abnormality, with history of multivessel CAD - Hx multivessel CAD s/p LHC and multivessel stenting in 2013. Admission EKG showed new TWI in inferior leads and V3. Troponins were cycled and negative x3. Repeated EKG with improvement in inferior TWI, but new lateral TWI. Difficult to interpret significance given paced rhythm.  - EP consult as above  - SL NG 0.450mq5m33mprn chest pain  - Continue home Plavix 37m41mily   #LLE cellulitis - Patient is supposedly s/p 14 days of Keflex. Still with erythema of calf, foot. Wound care saw him, no treatment recommended.  - Bactroban cream BID   #Hypertension - Currently well controlled. Creatinine improved from Cr 1.44 --> 1.51 --> 1.46, so can start ACEi/ARB   - Continue Coreg 3.125 mg BID (per Dr. HarwTerrence Dupontanged from home Lopressor 25 mg BID) - Start Ramipril 2.5 mg daily (per Dr. HarwTerrence Dupontanged from home olmesartan-HCTZ 40-12.5 mg daily)  #Paroxysmal atrial fibrillation - Currently in paced rhythm  - Continue home amiodarone 100mg69mly  - EP consult as above   #Hypothyroidism - Stable. TSH high at 8.507, free T4 wnl at 1.34.  - Continue home synthroid 50mcg71mly.   #HLD  - Continue Lipitor 40mg d17m and Lovaza 1g daily.   #GERD  - Continue Famotidine to 20mg da53mwhich is renal dosing.   #BPH  - Continue home finasteride 5mg dail49m  #Asymptomatic AAA - Patient last saw [facility] in the clinic on 03/07/12. Duplex ultrasound at that time showed a maximum diameter of 4.81 AP by 4.84 transverse, slight increase from January 2012 when he was 4.4 x 4.5. Repeat duplex on 09/07/12 showed no  significant change.   #DVT PPX - Lovenox subq and SCDs    Dispo: Disposition is deferred at this time; awaiting improvement of current medical problems.   The patient does have a current PCP (DONDIEGO,RICHARD M, MD) and [facility]   The patient does not have transportation limitations that hinder transportation to clinic appointments.   Contacts/follow up: Follow-up Information   Follow up with DONDIEGO,Maricela Curet/16/2015. (_0 :00am. This is your primary care doctor.)    Specialty:  Internal Medicine   Contact information:   [address] [phone] up with HARWANI,MClent Demarkpecialty:  Cardiology   Contact information:   [address] [phone] up with James AllThompson Grayerpecialty:  Cardiology   Contact information:   [address] [phone] is a Medical Student Note.  The care of the patient was discussed with Dr. McLean anAundra Dubinassessment and plan formulated with their assistance.  Please see their attached note for official documentation of the daily encounter.   LOS: 3  days   Maryclare Bean, Med Student 02/25/2013, 12:56 PM

## 2013-02-25 NOTE — Progress Notes (Signed)
Subjective:  Patient denies any chest pains states breathing has markedly improved. Denies any fever or chills. Tolerated Lexiscan nuclear stress test. Nuclear images results still pending  Objective:  Vital Signs in the last 24 hours: Temp:  [97.2 F (36.2 C)-97.8 F (36.6 C)] 97.7 F (36.5 C) (01/10 0500) Pulse Rate:  [60-67] 64 (01/10 0500) Resp:  [18-22] 18 (01/10 0500) BP: (103-147)/(54-73) 137/61 mmHg (01/10 0958) SpO2:  [91 %-99 %] 98 % (01/10 0500) Weight:  [79.017 kg (174 lb 3.2 oz)] 79.017 kg (174 lb 3.2 oz) (01/10 0500)  Intake/Output from previous day: 01/09 0701 - 01/10 0700 In: 1010 [P.O.:960; IV Piggyback:50] Out: 1400 [Urine:1400] Intake/Output from this shift: Total I/O In: 0  Out: 350 [Urine:350]  Physical Exam: Neck: no adenopathy, no carotid bruit, no JVD and supple, symmetrical, trachea midline Lungs: Decreased breath sound at bases Heart: regular rate and rhythm, S1, S2 normal and Soft diastolic murmur noted at left lower sternal border Abdomen: soft, non-tender; bowel sounds normal; no masses,  no organomegaly Extremities: extremities normal, atraumatic, no cyanosis or edema  Lab Results:  Recent Labs  02/23/13 0255 02/24/13 0557  WBC 8.2 6.7  HGB 12.8* 13.4  PLT 169 190    Recent Labs  02/24/13 0557 02/25/13 0455  NA 139 137  K 4.5 4.2  CL 101 102  CO2 23 22  GLUCOSE 83 86  BUN 29* 32*  CREATININE 1.51* 1.46*    Recent Labs  02/22/13 2125 02/23/13 0255  TROPONINI <0.30 <0.30   Hepatic Function Panel  Recent Labs  02/22/13 1123  PROT 6.6  ALBUMIN 3.5  AST 17  ALT 7  ALKPHOS 83  BILITOT 1.0   No results found for this basename: CHOL,  in the last 72 hours No results found for this basename: PROTIME,  in the last 72 hours  Imaging: Imaging results have been reviewed and No results found.  Cardiac Studies:  Assessment/Plan:  Compensated Acute  systolic heart failure with markedly depressed LV systolic function which  is new as compared to prior echo, rule out ischemia rule MI ruled out  Coronary artery disease history of silent MI in the past with occluded RCA  Multivessel CAD status post PCI to LAD left circumflex and OM 3 as above in November 2013  Resolving Probable multilobar bilateral pneumonia  Tachybradycardia syndrome status post permanent pacemaker  History of paroxysmal A. fib  Hypertension  Hypercholesteremia  Hypothyroidism  Chronic kidney disease stage II  Degenerative joint disease  Remote tobacco abuse  Abdominal aortic aneurysm Plan We'll add low-dose ACE inhibitors as per orders  Monitor renal function Check nuclear stress test results if negative for ischemia okay to discharge from cardiac point of view   LOS: 3 days    Connor Diaz N 02/25/2013, 10:45 AM

## 2013-02-25 NOTE — Progress Notes (Signed)
Pt discharged with oxygen by wheelchair, all discharged instructions given to patient and wife, all questions answered, NAD noted

## 2013-02-25 NOTE — Progress Notes (Signed)
SATURATION QUALIFICATIONS: (This note is used to comply with regulatory documentation for home oxygen)  Patient Saturations on Room Air at Rest = 92%  Patient Saturations on Room Air while Ambulating =81%  Patient Saturations on 3 Liters of oxygen while Ambulating = 95%  Please briefly explain why patient needs home oxygen: Pt oxygen level decreased to 81% while ambulating off of Oxygen. Pt symptomatic with SOB and labored breathing and after restarting Oxygen symptoms decreased.

## 2013-02-25 NOTE — Progress Notes (Signed)
Patient currently resting and complains of no pain or discomfort. Patient is NPO for stress test this morning. Will continue to monitor to end of shift.

## 2013-02-27 NOTE — Discharge Summary (Signed)
  Date: 02/27/2013  Patient name: Connor Diaz  Medical record number: [id number]  Date of birth: 1923/05/01   This patient has been discussed with the house staff. Please see their note for complete details. I concur with their findings and plan.  Jonah Blue, DO, FACP [facility] 02/27/2013, 9:45 AM

## 2013-03-01 LAB — CULTURE, BLOOD (ROUTINE X 2)
CULTURE: NO GROWTH
CULTURE: NO GROWTH

## 2013-03-08 ENCOUNTER — Other Ambulatory Visit (HOSPITAL_COMMUNITY): Payer: Self-pay | Admitting: Internal Medicine

## 2013-03-13 ENCOUNTER — Ambulatory Visit (INDEPENDENT_AMBULATORY_CARE_PROVIDER_SITE_OTHER): Payer: Medicare Other | Admitting: Internal Medicine

## 2013-03-13 ENCOUNTER — Encounter: Payer: Self-pay | Admitting: Internal Medicine

## 2013-03-13 VITALS — BP 146/84 | HR 66 | Ht 71.0 in | Wt 181.8 lb

## 2013-03-13 DIAGNOSIS — I498 Other specified cardiac arrhythmias: Secondary | ICD-10-CM

## 2013-03-13 DIAGNOSIS — R001 Bradycardia, unspecified: Secondary | ICD-10-CM

## 2013-03-13 DIAGNOSIS — I5041 Acute combined systolic (congestive) and diastolic (congestive) heart failure: Secondary | ICD-10-CM

## 2013-03-13 DIAGNOSIS — Z950 Presence of cardiac pacemaker: Secondary | ICD-10-CM

## 2013-03-13 LAB — MDC_IDC_ENUM_SESS_TYPE_INCLINIC
Battery Remaining Longevity: 93.6 mo
Date Time Interrogation Session: 20150126121521
Implantable Pulse Generator Model: 2210
Implantable Pulse Generator Serial Number: 7394618
Lead Channel Impedance Value: 600 Ohm
Lead Channel Pacing Threshold Amplitude: 0.5 V
Lead Channel Pacing Threshold Amplitude: 0.625 V
Lead Channel Pacing Threshold Pulse Width: 0.4 ms
Lead Channel Sensing Intrinsic Amplitude: 12 mV
Lead Channel Sensing Intrinsic Amplitude: 4.8 mV
Lead Channel Setting Pacing Amplitude: 1.5 V
Lead Channel Setting Pacing Pulse Width: 0.4 ms
MDC IDC MSMT BATTERY VOLTAGE: 2.95 V
MDC IDC MSMT LEADCHNL RA IMPEDANCE VALUE: 512.5 Ohm
MDC IDC MSMT LEADCHNL RV PACING THRESHOLD PULSEWIDTH: 0.4 ms
MDC IDC SET LEADCHNL RV PACING AMPLITUDE: 0.875
MDC IDC SET LEADCHNL RV SENSING SENSITIVITY: 2 mV
MDC IDC STAT BRADY RA PERCENT PACED: 96 %
MDC IDC STAT BRADY RV PERCENT PACED: 99.89 %

## 2013-03-13 MED ORDER — RAMIPRIL 5 MG PO CAPS: 5.0000 mg | ORAL_CAPSULE | Freq: Every day | ORAL | Status: DC

## 2013-03-13 NOTE — Patient Instructions (Signed)
Your physician recommends that you schedule a follow-up appointment in: 3 months with Rick Duff, PA  Your physician has recommended you make the following change in your medication:  1) Increase Ramipril to 5mg  daily---take 2 of the 2.5mg  pills until you run out then pick up new prescription

## 2013-03-14 ENCOUNTER — Encounter: Payer: Self-pay | Admitting: Vascular Surgery

## 2013-03-15 ENCOUNTER — Encounter: Payer: Self-pay | Admitting: Vascular Surgery

## 2013-03-15 ENCOUNTER — Ambulatory Visit (HOSPITAL_COMMUNITY)
Admission: RE | Admit: 2013-03-15 | Discharge: 2013-03-15 | Disposition: A | Discharge status: Home / Self Care | Payer: Medicare Other | Source: Ambulatory Visit | Attending: Vascular Surgery | Admitting: Vascular Surgery

## 2013-03-15 ENCOUNTER — Ambulatory Visit (INDEPENDENT_AMBULATORY_CARE_PROVIDER_SITE_OTHER): Payer: Medicare Other | Admitting: Vascular Surgery

## 2013-03-15 VITALS — BP 123/62 | HR 65 | Ht 71.0 in | Wt 177.0 lb

## 2013-03-15 DIAGNOSIS — I714 Abdominal aortic aneurysm, without rupture, unspecified: Secondary | ICD-10-CM

## 2013-03-15 DIAGNOSIS — Z48812 Encounter for surgical aftercare following surgery on the circulatory system: Secondary | ICD-10-CM

## 2013-03-15 NOTE — Progress Notes (Signed)
[facility]  Patient name: Connor Diaz MRN: [id number] DOB: [birth year] Sex: male  REASON FOR VISIT: Follow up of abdominal aortic aneurysm.  HPI: Connor Diaz is a 80 y.o. male who presents for follow up of his abdominal aortic aneurysm. He was last seen in July of 2014 when it was 4.8 cm in maximum diameter. In reviewing his records, back in 2011 it was 4.5 cm in maximum diameter. Since I saw him last, he denies any history of abdominal pain or back pain.  Since I saw him last, he has had coronary stents placed and also a pacemaker placed. He has done well since that time from a cardiac standpoint.   Past Medical History  Diagnosis Date  . Hypertension   . AAA (abdominal aortic aneurysm)     a. 02/2009 U/S 4.4 x 4.5 cm  . PAF (paroxysmal atrial fibrillation)     a. noted 11/2011  . Symptomatic bradycardia     a. noted 11/2011  . History of tobacco abuse     a. roughly 36 pack years, quit @ age 80.  Marland Kitchen. Pancreatitis     a. 10/2011 - managed conservatively @ home.  Marland Kitchen. BPH (benign prostatic hyperplasia)   . Coronary artery disease   . Myocardial infarction     "  SILENT "  . Anginal pain   . Hypothyroidism   . Shortness of breath   . Pacemaker 11/2011  . HCAP (healthcare-associated pneumonia) 11/2011  . Arthritis     "knees" (02/22/2013)  . DJD (degenerative joint disease)   . CKD (chronic kidney disease), stage III   . Systolic heart failure     02/2013  . CAP (community acquired pneumonia)     02/2013   Family History  Problem Relation Age of Onset  . Other Father     Accidental death @ age 78 - box fell on him at work  . Cancer Mother     died @ 7280 Bladder cancer   SOCIAL HISTORY: History  Substance Use Topics  . Smoking status: Former Smoker -- 1.00 packs/day for 35 years    Types: Cigarettes    Quit date: 12/17/1980  . Smokeless tobacco: Never Used     Comment: Quit at age 80.  Marland Kitchen. Alcohol Use: No   No Known  Allergies Current Outpatient Prescriptions  Medication Sig Dispense Refill  . amiodarone (PACERONE) 200 MG tablet Take 0.5 tablets (100 mg total) by mouth daily.      Marland Kitchen. atorvastatin (LIPITOR) 80 MG tablet Take 40 mg by mouth daily at 6 PM.      . carvedilol (COREG) 3.125 MG tablet Take 1 tablet (3.125 mg total) by mouth 2 (two) times daily with a meal.  60 tablet  0  . clopidogrel (PLAVIX) 75 MG tablet Take 75 mg by mouth daily with breakfast.      . famotidine (PEPCID) 20 MG tablet Take 20 mg by mouth daily.      . finasteride (PROSCAR) 5 MG tablet Take 5 mg by mouth daily.       . fish oil-omega-3 fatty acids 1000 MG capsule Take 1 g by mouth every morning.       . furosemide (LASIX) 40 MG tablet Take 1 tablet (40 mg total) by mouth daily.  30 tablet  0  . levothyroxine (SYNTHROID, LEVOTHROID) 50 MCG tablet Take 50 mcg by mouth daily before breakfast.       . mupirocin  ointment (BACTROBAN) 2 % Apply 1 application topically 2 (two) times daily. Apply to left lower leg  22 g  0  . naproxen (NAPROSYN) 500 MG tablet Take 1 tablet by mouth as needed.       . nitroGLYCERIN (NITROSTAT) 0.4 MG SL tablet Place 1 tablet (0.4 mg total) under the tongue every 5 (five) minutes x 3 doses as needed for chest pain.  25 tablet  3  . polyethylene glycol powder (GLYCOLAX/MIRALAX) powder Take 17 g by mouth daily as needed for moderate constipation.       . ramipril (ALTACE) 5 MG capsule Take 1 capsule (5 mg total) by mouth daily.  90 capsule  3   No current facility-administered medications for this visit.   REVIEW OF SYSTEMS: Arly.Keller ] denotes positive finding; [  ] denotes negative finding  CARDIOVASCULAR:  [ ]  chest pain   [ ]  chest pressure   [ ]  palpitations   [ ]  orthopnea   Arly.Keller ] dyspnea on exertion   Arly.Keller ] claudication   [ ]  rest pain   [ ]  DVT   [ ]  phlebitis PULMONARY:   [ ]  productive cough   [ ]  asthma   [ ]  wheezing NEUROLOGIC:   [ ]  weakness  [ ]  paresthesias  [ ]  aphasia  [ ]  amaurosis  [ ]   dizziness HEMATOLOGIC:   [ ]  bleeding problems   [ ]  clotting disorders MUSCULOSKELETAL:  [ ]  joint pain   [ ]  joint swelling [ ]  leg swelling GASTROINTESTINAL: [ ]   blood in stool  [ ]   hematemesis GENITOURINARY:  [ ]   dysuria  [ ]   hematuria PSYCHIATRIC:  [ ]  history of major depression INTEGUMENTARY:  [ ]  rashes  [ ]  ulcers CONSTITUTIONAL:  [ ]  fever   [ ]  chills  PHYSICAL EXAM: Filed Vitals:   03/15/13 0937  BP: 123/62  Pulse: 65  Height: 5\' 11"  (1.803 m)  Weight: 177 lb (80.287 kg)  SpO2: 100%   Body mass index is 24.7 kg/(m^2). GENERAL: The patient is a well-nourished male, in no acute distress. The vital signs are documented above. CARDIOVASCULAR: There is a regular rate and rhythm. I do not detect carotid bruits. He has palpable femoral pulses. Both feet are warm and well-perfused. He has mild bilateral lower extremity swelling. PULMONARY: There is good air exchange bilaterally without wheezing or rales. ABDOMEN: Soft and non-tender with normal pitched bowel sounds. His aneurysm is palpable and nontender. MUSCULOSKELETAL: There are no major deformities or cyanosis. NEUROLOGIC: No focal weakness or paresthesias are detected. SKIN: There are no ulcers or rashes noted. PSYCHIATRIC: The patient has a normal affect.  DATA:  I have independently interpreted his duplex of his aneurysm which shows that the maximum diameter is 5.08 cm. His iliac arteries are not aneurysmal. I have compared this to his previous study.  MEDICAL ISSUES:  Abdominal aneurysm without mention of rupture His aneurysm measures 5.1 cm in maximum diameter. It was 4.8 cm in July of 2014 and 4.5 way back in 2011. He understands we would not consider elective repair with the aneurysm reached 5.5 cm in maximum diameter. He is not a smoker. His blood pressure is under good control. I've ordered a follow up ultrasound in 6 months. I'll see him back in one year and last is any change in his ultrasound in 6 months. He  knows to call sooner if he has problems.   Charmin Aguiniga S [facility]   Beeper: [phone]

## 2013-03-15 NOTE — Assessment & Plan Note (Signed)
His aneurysm measures 5.1 cm in maximum diameter. It was 4.8 cm in July of 2014 and 4.5 way back in 2011. He understands we would not consider elective repair with the aneurysm reached 5.5 cm in maximum diameter. He is not a smoker. His blood pressure is under good control. I've ordered a follow up ultrasound in 6 months. I'll see him back in one year and last is any change in his ultrasound in 6 months. He knows to call sooner if he has problems.

## 2013-03-15 NOTE — Addendum Note (Signed)
Addended by: Adria Dill L on: 03/15/2013 10:55 AM   Modules accepted: Orders

## 2013-03-17 NOTE — Progress Notes (Signed)
PCP: Isabella StallingNDIEGO,RICHARD M, MD Primary Cardiologist:  Dr Madilyn HookHarwani  Maximillion R Payment is a 80 y.o. male who presents today for electrophysiology followup. He was recently hospitalized for CHF.  He has done very well since discharge.  His EF is newly decreased.. Today, he denies symptoms of palpitations, chest pain, shortness of breath,  lower extremity edema, dizziness, presyncope, or syncope.  The patient is otherwise without complaint today.   Past Medical History  Diagnosis Date  . Hypertension   . AAA (abdominal aortic aneurysm)     a. 02/2009 U/S 4.4 x 4.5 cm  . PAF (paroxysmal atrial fibrillation)     a. noted 11/2011  . Symptomatic bradycardia     a. noted 11/2011  . History of tobacco abuse     a. roughly 36 pack years, quit @ age 80.  Marland Kitchen. Pancreatitis     a. 10/2011 - managed conservatively @ home.  Marland Kitchen. BPH (benign prostatic hyperplasia)   . Coronary artery disease   . Myocardial infarction     "  SILENT "  . Anginal pain   . Hypothyroidism   . Shortness of breath   . Pacemaker 11/2011  . HCAP (healthcare-associated pneumonia) 11/2011  . Arthritis     "knees" (02/22/2013)  . DJD (degenerative joint disease)   . CKD (chronic kidney disease), stage III   . Systolic heart failure     02/2013  . CAP (community acquired pneumonia)     02/2013   Past Surgical History  Procedure Laterality Date  . Coronary angioplasty with stent placement  12/22/2011     LAD & CIRCUMFLEX  . Insert / replace / remove pacemaker  11/20/2011    Implanted by Dr Johney FrameAllred   . Tonsillectomy  ~ 1933  . Cataract extraction w/ intraocular lens  implant, bilateral Bilateral 1980's    Current Outpatient Prescriptions  Medication Sig Dispense Refill  . amiodarone (PACERONE) 200 MG tablet Take 0.5 tablets (100 mg total) by mouth daily.      Marland Kitchen. atorvastatin (LIPITOR) 80 MG tablet Take 40 mg by mouth daily at 6 PM.      . carvedilol (COREG) 3.125 MG tablet Take 1 tablet (3.125 mg total) by mouth 2 (two) times daily  with a meal.  60 tablet  0  . clopidogrel (PLAVIX) 75 MG tablet Take 75 mg by mouth daily with breakfast.      . famotidine (PEPCID) 20 MG tablet Take 20 mg by mouth daily.      . finasteride (PROSCAR) 5 MG tablet Take 5 mg by mouth daily.       . fish oil-omega-3 fatty acids 1000 MG capsule Take 1 g by mouth every morning.       . furosemide (LASIX) 40 MG tablet Take 1 tablet (40 mg total) by mouth daily.  30 tablet  0  . levothyroxine (SYNTHROID, LEVOTHROID) 50 MCG tablet Take 50 mcg by mouth daily before breakfast.       . mupirocin ointment (BACTROBAN) 2 % Apply 1 application topically 2 (two) times daily. Apply to left lower leg  22 g  0  . naproxen (NAPROSYN) 500 MG tablet Take 1 tablet by mouth as needed.       . nitroGLYCERIN (NITROSTAT) 0.4 MG SL tablet Place 1 tablet (0.4 mg total) under the tongue every 5 (five) minutes x 3 doses as needed for chest pain.  25 tablet  3  . polyethylene glycol powder (GLYCOLAX/MIRALAX) powder Take 17 g by mouth  daily as needed for moderate constipation.       . ramipril (ALTACE) 5 MG capsule Take 1 capsule (5 mg total) by mouth daily.  90 capsule  3   No current facility-administered medications for this visit.    Physical Exam: Filed Vitals:   03/13/13 1157  BP: 146/84  Pulse: 66  Height: 5\' 11"  (1.803 m)  Weight: 181 lb 12.8 oz (82.464 kg)  SpO2: 97%    GEN- The patient is well appearing, alert and oriented x 3 today.   Head- normocephalic, atraumatic Eyes-  Sclera clear, conjunctiva pink Ears- hearing intact Oropharynx- clear Lungs- Clear to ausculation bilaterally, normal work of breathing Chest- pacemaker pocket is well healed Heart- Regular rate and rhythm, no murmurs, rubs or gallops, PMI not laterally displaced GI- soft, NT, ND, + BS Extremities- no clubbing, cyanosis, or edema  Pacemaker interrogation- reviewed in detail today,  See PACEART report  Assessment and Plan:  1. Symptomatic sinus bradycardia Normal pacemaker  function See Pace Art report No changes today He has a very long AV delay (344 msec) and therefore V paces all of the time. Though we could consider upgrade to CRT-P given his CHF, he would prefer a more conservative approach given his advanced age.  2. Newly depressed EF/ chf Increase ramipril today Return to further medicine adjustement with Brooke in 3 months  3. ? afib He has had no afib since his last visit to our office He is on amiodarone presumptively for prior afib  If he has afib documented in the future then he will require anticoagulation If he has no afib upon return then we should stop amiodarone  Merlin Return in 3 months to see [facility]  Other cardiology care per Dr [name]

## 2013-03-19 ENCOUNTER — Other Ambulatory Visit (HOSPITAL_COMMUNITY): Payer: Self-pay | Admitting: Internal Medicine

## 2013-03-25 ENCOUNTER — Other Ambulatory Visit (HOSPITAL_COMMUNITY): Payer: Self-pay | Admitting: Internal Medicine

## 2013-03-28 ENCOUNTER — Encounter: Payer: Self-pay | Admitting: Internal Medicine

## 2013-03-29 ENCOUNTER — Other Ambulatory Visit (HOSPITAL_COMMUNITY): Payer: Self-pay | Admitting: Internal Medicine

## 2013-03-30 ENCOUNTER — Other Ambulatory Visit (HOSPITAL_COMMUNITY): Payer: Self-pay | Admitting: Internal Medicine

## 2013-06-14 ENCOUNTER — Encounter: Payer: Self-pay | Admitting: Cardiology

## 2013-06-14 ENCOUNTER — Ambulatory Visit (INDEPENDENT_AMBULATORY_CARE_PROVIDER_SITE_OTHER): Payer: Medicare Other | Admitting: Cardiology

## 2013-06-14 ENCOUNTER — Encounter: Payer: Medicare Other | Admitting: *Deleted

## 2013-06-14 VITALS — BP 120/70 | HR 60 | Wt 182.0 lb

## 2013-06-14 DIAGNOSIS — I2510 Atherosclerotic heart disease of native coronary artery without angina pectoris: Secondary | ICD-10-CM

## 2013-06-14 DIAGNOSIS — R001 Bradycardia, unspecified: Secondary | ICD-10-CM

## 2013-06-14 DIAGNOSIS — Z950 Presence of cardiac pacemaker: Secondary | ICD-10-CM

## 2013-06-14 DIAGNOSIS — I428 Other cardiomyopathies: Secondary | ICD-10-CM | POA: Insufficient documentation

## 2013-06-14 DIAGNOSIS — I498 Other specified cardiac arrhythmias: Secondary | ICD-10-CM

## 2013-06-14 DIAGNOSIS — I519 Heart disease, unspecified: Secondary | ICD-10-CM

## 2013-06-14 LAB — MDC_IDC_ENUM_SESS_TYPE_INCLINIC
Battery Remaining Longevity: 93.6 mo
Battery Voltage: 2.95 V
Brady Statistic RA Percent Paced: 85 %
Brady Statistic RV Percent Paced: 99.86 %
Date Time Interrogation Session: 20150429125130
Implantable Pulse Generator Model: 2210
Lead Channel Impedance Value: 480 Ohm
Lead Channel Impedance Value: 580 Ohm
Lead Channel Pacing Threshold Amplitude: 0.5 V
Lead Channel Pacing Threshold Pulse Width: 0.4 ms
Lead Channel Sensing Intrinsic Amplitude: 4.4 mV
Lead Channel Setting Pacing Amplitude: 1.5 V
MDC IDC MSMT LEADCHNL RA PACING THRESHOLD PULSEWIDTH: 0.4 ms
MDC IDC MSMT LEADCHNL RV PACING THRESHOLD AMPLITUDE: 0.75 V
MDC IDC MSMT LEADCHNL RV SENSING INTR AMPL: 12 mV
MDC IDC PG SERIAL: 7394618
MDC IDC SET LEADCHNL RV PACING AMPLITUDE: 0.875
MDC IDC SET LEADCHNL RV PACING PULSEWIDTH: 0.4 ms
MDC IDC SET LEADCHNL RV SENSING SENSITIVITY: 2 mV

## 2013-06-14 MED ORDER — CARVEDILOL 3.125 MG PO TABS: 3.1250 mg | ORAL_TABLET | Freq: Two times a day (BID) | ORAL | Status: DC

## 2013-06-14 NOTE — Progress Notes (Signed)
ELECTROPHYSIOLOGY OFFICE NOTE   Patient ID: Connor Diaz MRN: [id number], DOB/AGE: [birth year]   Date of Visit: 06/14/2013  Primary Physician: Janna Arch, MD Primary Cardiologist: [name], MD Primary EP: Johney Frame, MD Reason for Visit: EP/device follow-up  History of Present Illness  Connor Diaz is a 80 y.o. male with symptomatic bradycardia s/p PPM implant, newly diagnosed LV dysfunction, EF 20-25% by echo Jan 2015 and CAD who presents today for routine electrophysiology followup. Since last being seen in our clinic, he reports he is doing well and has no complaints. He denies chest pain or shortness of breath. He denies palpitations, dizziness, near syncope or syncope. He denies LE swelling, orthopnea, PND or recent weight gain. He stopped taking Coreg 2 months ago stating he ran out of refills and didn't know who to call. Apparently his pharmacy told him the prescriber listed for the medication "could not be found." He has continued all other medications.  Past Medical History Past Medical History  Diagnosis Date  . Hypertension   . AAA (abdominal aortic aneurysm)     a. 02/2009 U/S 4.4 x 4.5 cm  . PAF (paroxysmal atrial fibrillation)     a. noted 11/2011  . Symptomatic bradycardia     a. noted 11/2011  . History of tobacco abuse     a. roughly 36 pack years, quit @ age 72.  Marland Kitchen Pancreatitis     a. 10/2011 - managed conservatively @ home.  Marland Kitchen BPH (benign prostatic hyperplasia)   . Coronary artery disease   . Myocardial infarction     "  SILENT "  . Anginal pain   . Hypothyroidism   . Shortness of breath   . Pacemaker 11/2011  . HCAP (healthcare-associated pneumonia) 11/2011  . Arthritis     "knees" (02/22/2013)  . DJD (degenerative joint disease)   . CKD (chronic kidney disease), stage III   . Systolic heart failure     02/2013  . CAP (community acquired pneumonia)     02/2013  . Non-ischemic cardiomyopathy     Past Surgical History Past Surgical History  Procedure  Laterality Date  . Coronary angioplasty with stent placement  12/22/2011     LAD & CIRCUMFLEX  . Insert / replace / remove pacemaker  11/20/2011    Implanted by [name]   . Tonsillectomy  ~ 1933  . Cataract extraction w/ intraocular lens  implant, bilateral Bilateral 1980's    Allergies/Intolerances No Known Allergies  Current Home Medications Current Outpatient Prescriptions  Medication Sig Dispense Refill  . ramipril (ALTACE) 10 MG capsule Take 10 mg by mouth daily.      Marland Kitchen atorvastatin (LIPITOR) 80 MG tablet Take 40 mg by mouth daily at 6 PM.      . carvedilol (COREG) 3.125 MG tablet Take 1 tablet (3.125 mg total) by mouth 2 (two) times daily with a meal.  60 tablet  4  . clopidogrel (PLAVIX) 75 MG tablet Take 75 mg by mouth daily with breakfast.      . famotidine (PEPCID) 20 MG tablet Take 20 mg by mouth daily.      . finasteride (PROSCAR) 5 MG tablet Take 5 mg by mouth daily.       . fish oil-omega-3 fatty acids 1000 MG capsule Take 1 g by mouth every morning.       . furosemide (LASIX) 40 MG tablet Take 1 tablet (40 mg total) by mouth daily.  30 tablet  0  . levothyroxine (SYNTHROID, LEVOTHROID)  50 MCG tablet Take 50 mcg by mouth daily before breakfast.       . mupirocin ointment (BACTROBAN) 2 % Apply 1 application topically 2 (two) times daily. Apply to left lower leg  22 g  0  . naproxen (NAPROSYN) 500 MG tablet Take 1 tablet by mouth as needed.       . nitroGLYCERIN (NITROSTAT) 0.4 MG SL tablet Place 1 tablet (0.4 mg total) under the tongue every 5 (five) minutes x 3 doses as needed for chest pain.  25 tablet  3  . polyethylene glycol powder (GLYCOLAX/MIRALAX) powder Take 17 g by mouth daily as needed for moderate constipation.        No current facility-administered medications for this visit.    Social History History   Social History  . Marital Status: Married    Spouse Name: N/A    Number of Children: N/A  . Years of Education: N/A   Occupational History  . Not  on file.   Social History Main Topics  . Smoking status: Former Smoker -- 1.00 packs/day for 35 years    Types: Cigarettes    Quit date: 12/17/1980  . Smokeless tobacco: Never Used     Comment: Quit at age 80.  Marland Kitchen. Alcohol Use: No  . Drug Use: No  . Sexual Activity: Not Currently   Other Topics Concern  . Not on file   Social History Narrative   [place] lives in [place] with his wife.  He is retired from [employer].  He is relatively active @ home.     Review of Systems General: No chills, fever, night sweats or weight changes Cardiovascular: No chest pain, dyspnea on exertion, edema, orthopnea, palpitations, paroxysmal nocturnal dyspnea Dermatological: No rash, lesions or masses Respiratory: No cough, dyspnea Urologic: No hematuria, dysuria Abdominal: No nausea, vomiting, diarrhea, bright red blood per rectum, melena, or hematemesis Neurologic: No visual changes, weakness, changes in mental status All other systems reviewed and are otherwise negative except as noted above.  Physical Exam Vitals: Blood pressure 120/70, pulse 60, weight 182 lb (82.555 kg).  General: Well developed, well appearing 80 y.o. male in no acute distress. HEENT: Normocephalic, atraumatic. EOMs intact. Sclera nonicteric. Oropharynx clear.  Neck: Supple without bruits. No JVD. Lungs: Respirations regular and unlabored, CTA bilaterally. No wheezes, rales or rhonchi. Heart: RRR. S1, S2 present. No murmurs, rub, S3 or S4. Abdomen: Soft, non-tender, non-distended. BS present x 4 quadrants. No hepatosplenomegaly.  Extremities: No clubbing, cyanosis or edema. DP/PT/Radials 2+ and equal bilaterally. Psych: Normal affect. Neuro: Alert and oriented X 3. Moves all extremities spontaneously.   Diagnostics Echocardiogram Jan 2015 Study Conclusions - Left ventricle: The cavity size was severely dilated. Systolic function was severely reduced. The estimated ejection fraction was in the range of 20% to  25%. Doppler parameters are consistent with abnormal left ventricular relaxation (grade 1 diastolic dysfunction). - Aortic valve: Mild regurgitation. - Mitral valve: Mild regurgitation. - Left atrium: The atrium was moderately to severely dilated. - Right ventricle: Systolic function was mildly reduced. - Right atrium: The atrium was moderately dilated. - Pulmonary arteries: PA peak pressure: 37mm Hg (S).  Device interrogation today - Normal device function. Thresholds, sensing, impedances consistent with previous measurements. Device programmed to maximize longevity. 4 mode switch episodes, longest 6 seconds, EGMs reviewed and no evidence of AFib. No high ventricular rates noted. Device programmed at appropriate safety margins. Histogram distribution appropriate for patient activity level. Device programmed to optimize intrinsic conduction. Estimated  longevity 7.8 - 8.4 years.   Assessment and Plan  1. Symptomatic bradycardia s/p PPM implant Normal device function No programming changes made He has a very long AV delay (344 msec) and therefore V paces all of the time. Per last visit with Dr. Johney Frame, we could consider upgrade to CRT-P given his LV dysfunction; however, he would prefer a more conservative approach given his advanced age. Will await repeat echo results.  2. LV dysfunction, EF 20-25% by echo Jan 2015 Stopped Coreg 2 months ago (states he didn't have refills and didn't know who to call) Resume Coreg today Continue ramipril Repeat echo in 3 months  Follow-up with Dr. Johney Frame in 3 months  3. ? atrial fibrillation No AFib on device interrogation today Stop amiodarone  4. CAD No anginal symptoms Followed by Dr. [name]  Signed, Minda Meo, PA-C 06/14/2013, 5:29 PM

## 2013-06-14 NOTE — Patient Instructions (Signed)
Your physician has recommended you make the following change in your medication:  1. Stop Amiodarone  2. Start Carvedilol 3.125 mg 1 tab twice daily   Your physician has requested that you have an echocardiogram in 3 months . Echocardiography is a painless test that uses sound waves to create images of your heart. It provides your doctor with information about the size and shape of your heart and how well your heart's chambers and valves are working. This procedure takes approximately one hour. There are no restrictions for this procedure.  Your physician recommends that you schedule a follow-up appointment in: 3 months with Dr. Johney Frame

## 2013-08-03 ENCOUNTER — Encounter: Payer: Self-pay | Admitting: Internal Medicine

## 2013-09-12 ENCOUNTER — Encounter: Payer: Self-pay | Admitting: Family

## 2013-09-13 ENCOUNTER — Encounter: Payer: Self-pay | Admitting: Family

## 2013-09-13 ENCOUNTER — Other Ambulatory Visit (HOSPITAL_COMMUNITY): Payer: Medicare Other

## 2013-09-13 ENCOUNTER — Ambulatory Visit (HOSPITAL_BASED_OUTPATIENT_CLINIC_OR_DEPARTMENT_OTHER): Payer: Medicare Other | Admitting: Cardiology

## 2013-09-13 ENCOUNTER — Ambulatory Visit (HOSPITAL_COMMUNITY)
Admission: RE | Admit: 2013-09-13 | Discharge: 2013-09-13 | Disposition: A | Discharge status: Home / Self Care | Payer: Medicare Other | Source: Ambulatory Visit | Attending: Family | Admitting: Family

## 2013-09-13 ENCOUNTER — Ambulatory Visit (INDEPENDENT_AMBULATORY_CARE_PROVIDER_SITE_OTHER): Payer: Medicare Other | Admitting: Family

## 2013-09-13 VITALS — BP 130/71 | HR 60 | Resp 16 | Ht 70.5 in | Wt 180.0 lb

## 2013-09-13 DIAGNOSIS — I714 Abdominal aortic aneurysm, without rupture, unspecified: Secondary | ICD-10-CM

## 2013-09-13 DIAGNOSIS — I519 Heart disease, unspecified: Secondary | ICD-10-CM

## 2013-09-13 DIAGNOSIS — Z950 Presence of cardiac pacemaker: Secondary | ICD-10-CM

## 2013-09-13 DIAGNOSIS — Z48812 Encounter for surgical aftercare following surgery on the circulatory system: Secondary | ICD-10-CM | POA: Diagnosis present

## 2013-09-13 DIAGNOSIS — I2510 Atherosclerotic heart disease of native coronary artery without angina pectoris: Secondary | ICD-10-CM

## 2013-09-13 DIAGNOSIS — R001 Bradycardia, unspecified: Secondary | ICD-10-CM

## 2013-09-13 DIAGNOSIS — I428 Other cardiomyopathies: Secondary | ICD-10-CM

## 2013-09-13 NOTE — Progress Notes (Signed)
[facility] [place]  Established Abdominal Aortic Aneurysm  History of Present Illness  Connor Diaz is a 80 y.o. (1923-12-03) male patient of Dr. Edilia Boickson who presents with chief complaint: follow up for AAA.  Previous studies demonstrate an AAA, measuring 5.1 cm. In 2011 it was 4.5 cm in maximum diameter  The patient does not have back or abdominal pain.  The patient is not a smoker. The patient denies claudication in legs with walking, denies non healing wounds. States he recently was treated for cellulitis in his lower legs and this has improved. The patient denies history of stroke or TIA symptoms. He reports that he probably cannot have bilateral knee surgery to address his OA pain due to his heart issues.  He states that he will see Dr. Johney FrameAllred, his cardiologist, today for follow up.  Pt Diabetic: No Pt smoker: former smoker, quit in 1982  Past Medical History  Diagnosis Date  . Hypertension   . AAA (abdominal aortic aneurysm)     a. 02/2009 U/S 4.4 x 4.5 cm  . PAF (paroxysmal atrial fibrillation)     a. noted 11/2011  . Symptomatic bradycardia     a. noted 11/2011  . History of tobacco abuse     a. roughly 36 pack years, quit @ age 80.  Marland Kitchen. Pancreatitis     a. 10/2011 - managed conservatively @ home.  Marland Kitchen. BPH (benign prostatic hyperplasia)   . Coronary artery disease   . Myocardial infarction     "  SILENT "  . Anginal pain   . Hypothyroidism   . Shortness of breath   . Pacemaker 11/2011  . HCAP (healthcare-associated pneumonia) 11/2011  . Arthritis     "knees" (02/22/2013)  . DJD (degenerative joint disease)   . CKD (chronic kidney disease), stage III   . Systolic heart failure     02/2013  . CAP (community acquired pneumonia)     02/2013  . Non-ischemic cardiomyopathy    Past Surgical History  Procedure Laterality Date  . Coronary angioplasty with stent placement  12/22/2011     LAD & CIRCUMFLEX  . Insert / replace / remove pacemaker   11/20/2011    Implanted by Dr Johney FrameAllred   . Tonsillectomy  ~ 1933  . Cataract extraction w/ intraocular lens  implant, bilateral Bilateral 1980's   Social History History   Social History  . Marital Status: Married    Spouse Name: N/A    Number of Children: N/A  . Years of Education: N/A   Occupational History  . Not on file.   Social History Main Topics  . Smoking status: Former Smoker -- 1.00 packs/day for 35 years    Types: Cigarettes    Quit date: 12/17/1980  . Smokeless tobacco: Never Used     Comment: Quit at age 80.  Marland Kitchen. Alcohol Use: No  . Drug Use: No  . Sexual Activity: Not Currently   Other Topics Concern  . Not on file   Social History Narrative   Pt lives in [place] with his wife.  He is retired from [employer].  He is relatively active @ home.   Family History Family History  Problem Relation Age of Onset  . Other Father     Accidental death @ age 80 - box fell on him at work  . Cancer Mother     died @ 6380 Bladder cancer    Current Outpatient Prescriptions on File Prior to  Visit  Medication Sig Dispense Refill  . atorvastatin (LIPITOR) 80 MG tablet Take 40 mg by mouth daily at 6 PM.      . carvedilol (COREG) 3.125 MG tablet Take 1 tablet (3.125 mg total) by mouth 2 (two) times daily with a meal.  60 tablet  4  . clopidogrel (PLAVIX) 75 MG tablet Take 75 mg by mouth daily with breakfast.      . famotidine (PEPCID) 20 MG tablet Take 20 mg by mouth daily.      . finasteride (PROSCAR) 5 MG tablet Take 5 mg by mouth daily.       . fish oil-omega-3 fatty acids 1000 MG capsule Take 1 g by mouth every morning.       . furosemide (LASIX) 40 MG tablet Take 1 tablet (40 mg total) by mouth daily.  30 tablet  0  . levothyroxine (SYNTHROID, LEVOTHROID) 50 MCG tablet Take 50 mcg by mouth daily before breakfast.       . mupirocin ointment (BACTROBAN) 2 % Apply 1 application topically 2 (two) times daily. Apply to left lower leg  22 g  0  . naproxen  (NAPROSYN) 500 MG tablet Take 1 tablet by mouth as needed.       . nitroGLYCERIN (NITROSTAT) 0.4 MG SL tablet Place 1 tablet (0.4 mg total) under the tongue every 5 (five) minutes x 3 doses as needed for chest pain.  25 tablet  3  . polyethylene glycol powder (GLYCOLAX/MIRALAX) powder Take 17 g by mouth daily as needed for moderate constipation.       . ramipril (ALTACE) 10 MG capsule Take 10 mg by mouth daily.       No current facility-administered medications on file prior to visit.   No Known Allergies  ROS: See HPI for pertinent positives and negatives.  Physical Examination Filed Vitals:   09/13/13 0921  BP: 130/71  Pulse: 60  Resp: 16  Height: 5' 10.5" (1.791 m)  Weight: 180 lb (81.647 kg)  SpO2: 100%   Body mass index is 25.45 kg/(m^2).  General: A&O x 3, WD, walking with cane.  Pulmonary: Sym exp, good air movt, CTAB, no rales, rhonchi, or wheezing.  Cardiac: RRR.   Aorta is not palpable Radial pulses are 2+ palpable and =.                          VASCULAR EXAM: Lower legs are warm and well perfused.                                                                                                         LE Pulses Right Left       FEMORAL  2+ palpable  2+ palpable    Gastrointestinal: soft, NTND, -G/R, - HSM, - masses, - CVAT B.  Musculoskeletal:  Extremities without ischemic changes, no edema, ambulating with cane, has neoprene supports at both knees.  Neurologic: CN 2-12 grossly intact.  Non-Invasive Vascular Imaging  AAA Duplex (09/13/2013)  ABDOMINAL AORTA DUPLEX EVALUATION  INDICATION: Follow-up abdominal aortic aneurysm     PREVIOUS INTERVENTION(S):     DUPLEX EXAM:     LOCATION DIAMETER AP (cm) DIAMETER TRANSVERSE (cm) VELOCITIES (cm/sec)  Aorta Proximal 3.25 3.20 37  Aorta Mid 3.19 3.32 33  Aorta Distal 5.0 5.02 43  Right Common Iliac Artery 1.48 1.39 53  Left Common Iliac Artery 1.5 1.5 55    Previous max aortic diameter:  5.08 Date:  03/15/2013     ADDITIONAL FINDINGS:     IMPRESSION: Stable abdominal aortic aneurysm measuring 5.0cm x 5.02cm on today's exam with moderate intramural thrombus. Remainder of the abdominal aorta appears ectatic.    Compared to the previous exam:  No significant change compared to prior exam.     Medical Decision Making  The patient is a 80 y.o. male who presents with asymptomatic AAA with no increase in size.   Based on this patient's exam and diagnostic studies, the patient will follow up in 6 months  with the following studies: AAA Duplex.  Consideration for repair of AAA would be made when the size is 5.5 cm, growth > 1 cm/yr, and symptomatic status.  I emphasized the importance of maximal medical management including strict control of blood pressure, blood glucose, and lipid levels, antiplatelet agents, obtaining regular exercise, and continued cessation of smoking.   The patient was given information about AAA including signs, symptoms, treatment, and how to minimize the risk of enlargement and rupture of aneurysms.    The patient was advised to call [phone] should the patient experience sudden onset abdominal or back pain.   Thank you for allowing [place] to participate in this patient's care.  Charisse March, RN, MSN, FNP-C [facility] [place] Office: [phone]  Clinic Physician: Edilia Bo  09/13/2013, 9:03 AM

## 2013-09-13 NOTE — Patient Instructions (Signed)
Abdominal Aortic Aneurysm An aneurysm is a weakened or damaged part of an artery wall that bulges from the normal force of blood pumping through the body. An abdominal aortic aneurysm is an aneurysm that occurs in the lower part of the aorta, the main artery of the body.  The major concern with an abdominal aortic aneurysm is that it can enlarge and burst (rupture) or blood can flow between the layers of the wall of the aorta through a tear (aorticdissection). Both of these conditions can cause bleeding inside the body and can be life threatening unless diagnosed and treated promptly. CAUSES  The exact cause of an abdominal aortic aneurysm is unknown. Some contributing factors are:   A hardening of the arteries caused by the buildup of fat and other substances in the lining of a blood vessel (arteriosclerosis).  Inflammation of the walls of an artery (arteritis).   Connective tissue diseases, such as Marfan syndrome.   Abdominal trauma.   An infection, such as syphilis or staphylococcus, in the wall of the aorta (infectious aortitis) caused by bacteria. RISK FACTORS  Risk factors that contribute to an abdominal aortic aneurysm may include:  Age older than 60 years.   High blood pressure (hypertension).  Male gender.  Ethnicity (white race).  Obesity.  Family history of aneurysm (first degree relatives only).  Tobacco use. PREVENTION  The following healthy lifestyle habits may help decrease your risk of abdominal aortic aneurysm:  Quitting smoking. Smoking can raise your blood pressure and cause arteriosclerosis.  Limiting or avoiding alcohol.  Keeping your blood pressure, blood sugar level, and cholesterol levels within normal limits.  Decreasing your salt intake. In somepeople, too much salt can raise blood pressure and increase your risk of abdominal aortic aneurysm.  Eating a diet low in saturated fats and cholesterol.  Increasing your fiber intake by including  whole grains, vegetables, and fruits in your diet. Eating these foods may help lower blood pressure.  Maintaining a healthy weight.  Staying physically active and exercising regularly. SYMPTOMS  The symptoms of abdominal aortic aneurysm may vary depending on the size and rate of growth of the aneurysm.Most grow slowly and do not have any symptoms. When symptoms do occur, they may include:  Pain (abdomen, side, lower back, or groin). The pain may vary in intensity. A sudden onset of severe pain may indicate that the aneurysm has ruptured.  Feeling full after eating only small amounts of food.  Nausea or vomiting or both.  Feeling a pulsating lump in the abdomen.  Feeling faint or passing out. DIAGNOSIS  Since most unruptured abdominal aortic aneurysms have no symptoms, they are often discovered during diagnostic exams for other conditions. An aneurysm may be found during the following procedures:  Ultrasonography (A one-time screening for abdominal aortic aneurysm by ultrasonography is also recommended for all men aged 65-75 years who have ever smoked).  X-ray exams.  A computed tomography (CT).  Magnetic resonance imaging (MRI).  Angiography or arteriography. TREATMENT  Treatment of an abdominal aortic aneurysm depends on the size of your aneurysm, your age, and risk factors for rupture. Medication to control blood pressure and pain may be used to manage aneurysms smaller than 6 cm. Regular monitoring for enlargement may be recommended by your caregiver if:  The aneurysm is 3-4 cm in size (an annual ultrasonography may be recommended).  The aneurysm is 4-4.5 cm in size (an ultrasonography every 6 months may be recommended).  The aneurysm is larger than 4.5 cm in   size (your caregiver may ask that you be examined by a vascular surgeon). If your aneurysm is larger than 6 cm, surgical repair may be recommended. There are two main methods for repair of an aneurysm:   Endovascular  repair (a minimally invasive surgery). This is done most often.  Open repair. This method is used if an endovascular repair is not possible. Document Released: 11/12/2004 Document Revised: 05/30/2012 Document Reviewed: 03/04/2012 ExitCare Patient Information 2015 [employer]. This information is not intended to replace advice given to you by your health care provider. Make sure you discuss any questions you have with your health care provider.  

## 2013-09-13 NOTE — Progress Notes (Signed)
Echo performed. 

## 2013-11-06 ENCOUNTER — Other Ambulatory Visit: Payer: Self-pay | Admitting: *Deleted

## 2013-11-06 DIAGNOSIS — I519 Heart disease, unspecified: Secondary | ICD-10-CM

## 2013-11-06 MED ORDER — CARVEDILOL 3.125 MG PO TABS: 3.1250 mg | ORAL_TABLET | Freq: Two times a day (BID) | ORAL | Status: DC

## 2013-12-04 ENCOUNTER — Other Ambulatory Visit: Payer: Self-pay | Admitting: Internal Medicine

## 2014-01-01 ENCOUNTER — Other Ambulatory Visit: Payer: Self-pay | Admitting: Internal Medicine

## 2014-01-06 ENCOUNTER — Encounter (HOSPITAL_COMMUNITY): Payer: Self-pay

## 2014-01-06 ENCOUNTER — Ambulatory Visit (HOSPITAL_COMMUNITY): Payer: Medicare Other | Attending: Emergency Medicine

## 2014-01-06 ENCOUNTER — Emergency Department (INDEPENDENT_AMBULATORY_CARE_PROVIDER_SITE_OTHER)
Admission: EM | Admit: 2014-01-06 | Discharge: 2014-01-06 | Disposition: A | Discharge status: Home / Self Care | Payer: Medicare Other | Source: Home / Self Care | Attending: Emergency Medicine | Admitting: Emergency Medicine

## 2014-01-06 DIAGNOSIS — R05 Cough: Secondary | ICD-10-CM

## 2014-01-06 DIAGNOSIS — R059 Cough, unspecified: Secondary | ICD-10-CM

## 2014-01-06 DIAGNOSIS — R0989 Other specified symptoms and signs involving the circulatory and respiratory systems: Secondary | ICD-10-CM | POA: Insufficient documentation

## 2014-01-06 DIAGNOSIS — J069 Acute upper respiratory infection, unspecified: Secondary | ICD-10-CM

## 2014-01-06 MED ORDER — IPRATROPIUM BROMIDE 0.06 % NA SOLN: 2.0000 | Freq: Four times a day (QID) | NASAL | Status: DC

## 2014-01-06 MED ORDER — HYDROCOD POLST-CHLORPHEN POLST 10-8 MG/5ML PO LQCR: 5.0000 mL | Freq: Two times a day (BID) | ORAL | Status: DC | PRN

## 2014-01-06 NOTE — ED Notes (Signed)
C/o cough onset soon " after cleaning out a barn the other day"

## 2014-01-06 NOTE — ED Provider Notes (Addendum)
Chief Complaint   Cough   History of Present Illness   Connor Diaz is a 80 year old male who has had a 3 day history of dry cough and clear rhinorrhea. He has not had any fever, chills, headache, sore throat, difficulty breathing, chest pain, or GI symptoms. He's had no known sick exposures.  Review of Systems   Other than as noted above, the patient denies any of the following symptoms: Systemic:  No fevers, chills, sweats, or myalgias. Eye:  No redness or discharge. ENT:  No ear pain, headache, nasal congestion, drainage, sinus pressure, or sore throat. Neck:  No neck pain, stiffness, or swollen glands. Lungs:  No cough, sputum production, hemoptysis, wheezing, chest tightness, shortness of breath or chest pain. GI:  No abdominal pain, nausea, vomiting or diarrhea.  PMFSH   Past medical history, family history, social history, meds, and allergies were reviewed. He has a history of a pacemaker and 3 stents as well as high blood pressure. Current meds include Lipitor, carvedilol, Plavix, Pepcid, Proscar, Lasix, Synthroid, Nitrostat, MiraLAX, and ramipril.  Physical exam   Vital signs:  BP 134/70 mmHg  Pulse 71  Temp(Src) 97.5 F (36.4 C) (Oral)  Resp 16  SpO2 95% General:  Alert and oriented.  In no distress.  Skin warm and dry. Eye:  No conjunctival injection or drainage. Lids were normal. ENT:  TMs and canals were normal, without erythema or inflammation.  Nasal mucosa was clear and uncongested, without drainage.  Mucous membranes were moist.  Pharynx was clear with no exudate or drainage.  There were no oral ulcerations or lesions. Neck:  Supple, no adenopathy, tenderness or mass. Lungs:  No respiratory distress.  Lungs were clear to auscultation, without wheezes, rales or rhonchi.  Breath sounds were clear and equal bilaterally.  Heart:  Regular rhythm, without gallops, murmers or rubs. Skin:  Clear, warm, and dry, without rash or lesions.  EKG Results:  Date:  01/06/2014  Rate: 74  Rhythm: Ventricular paced rhythm  QRS Axis: normal--20  Intervals: normal  ST/T Wave abnormalities: normal  Conduction Disutrbances:none  Narrative Interpretation: Ventricular paced rhythm, otherwise normal EKG  Old EKG Reviewed: none available   Radiology   Dg Chest 2 View  01/06/2014   CLINICAL DATA:  Dry cough and runny nose 3 days.  EXAM: CHEST  2 VIEW  COMPARISON:  02/22/2013 and 02/03/2013  FINDINGS: Left-sided pacemaker unchanged. Lungs are adequately inflated without consolidation or effusion. There is subtle chronic increased interstitial markings in the lung bases. There is borderline cardiomegaly unchanged. There is minimal calcified plaque over the thoracic aorta. There is evidence of patient's coronary artery stents. There is mild degenerative change of the spine.  IMPRESSION: No acute cardiopulmonary disease.  Minimal chronic basilar interstitial disease.  Stable borderline cardiomegaly.   Electronically Signed   By: Elberta Fortis M.D.   On: 01/06/2014 11:43   Assessment     The primary encounter diagnosis was Viral URI. A diagnosis of Cough was also pertinent to this visit.  There is no evidence of pneumonia, strep throat, sinusitis, otitis media.    Plan    1.  Meds:  The following meds were prescribed:   Discharge Medication List as of 01/06/2014 12:01 PM    START taking these medications   Details  chlorpheniramine-HYDROcodone (TUSSIONEX) 10-8 MG/5ML LQCR Take 5 mLs by mouth every 12 (twelve) hours as needed for cough., Starting 01/06/2014, Until Discontinued, Normal    ipratropium (ATROVENT) 0.06 % nasal spray Place  2 sprays into both nostrils 4 (four) times daily., Starting 01/06/2014, Until Discontinued, Normal        2.  Patient Education/Counseling:  The patient was given appropriate handouts, self care instructions, and instructed in symptomatic relief.  Instructed to get extra fluids and extra rest.    3.  Follow up:  The patient  was told to follow up here if no better in 3 to 4 days, or sooner if becoming worse in any way, and given some red flag symptoms such as increasing fever, difficulty breathing, chest pain, or persistent vomiting which would prompt immediate return.       Reuben Likesavid C Charae Depaolis, MD 01/06/14 1320  Reuben Likesavid C Lorenza Shakir, MD 01/06/14 [phone]

## 2014-01-06 NOTE — Discharge Instructions (Signed)

## 2014-01-25 ENCOUNTER — Encounter (HOSPITAL_COMMUNITY): Payer: Self-pay | Admitting: Cardiology

## 2014-03-04 ENCOUNTER — Other Ambulatory Visit: Payer: Self-pay | Admitting: Internal Medicine

## 2014-03-21 ENCOUNTER — Ambulatory Visit: Payer: Medicare Other | Admitting: Vascular Surgery

## 2014-03-21 ENCOUNTER — Other Ambulatory Visit (HOSPITAL_COMMUNITY): Payer: Medicare Other

## 2014-03-27 ENCOUNTER — Encounter: Payer: Self-pay | Admitting: *Deleted

## 2014-03-28 ENCOUNTER — Encounter: Payer: Self-pay | Admitting: Vascular Surgery

## 2014-03-29 ENCOUNTER — Ambulatory Visit (HOSPITAL_COMMUNITY)
Admission: RE | Admit: 2014-03-29 | Discharge: 2014-03-29 | Disposition: A | Discharge status: Home / Self Care | Payer: Medicare Other | Source: Ambulatory Visit | Attending: Vascular Surgery | Admitting: Vascular Surgery

## 2014-03-29 ENCOUNTER — Encounter: Payer: Self-pay | Admitting: Vascular Surgery

## 2014-03-29 ENCOUNTER — Ambulatory Visit (INDEPENDENT_AMBULATORY_CARE_PROVIDER_SITE_OTHER): Payer: Medicare Other | Admitting: Vascular Surgery

## 2014-03-29 VITALS — BP 141/62 | HR 60 | Ht 70.5 in | Wt 177.0 lb

## 2014-03-29 DIAGNOSIS — Z48812 Encounter for surgical aftercare following surgery on the circulatory system: Secondary | ICD-10-CM

## 2014-03-29 DIAGNOSIS — I714 Abdominal aortic aneurysm, without rupture, unspecified: Secondary | ICD-10-CM

## 2014-03-29 NOTE — Progress Notes (Signed)
[facility]  Patient name: Connor Diaz MRN: [id number] DOB: October 06, 1923 Sex: male  REASON FOR VISIT: Follow up of abdominal aortic aneurysm  HPI: Connor Diaz is a 81 y.o. male who I have been following with an abdominal aortic aneurysm. I last saw him 1 year ago on 03/15/2013. Back in 2011 the aneurysm measured 4.5 cm in maximum diameter. At the time of his visit one year ago the aneurysm measured 5.1 centimeters in maximum diameter. He comes in for a routine follow up visit.  Since I saw him last, he denies any history of abdominal pain or back pain. His only complaint is some knee pain. He has been considered for knee replacement but is holding off at this point.   Past Medical History  Diagnosis Date  . Hypertension   . AAA (abdominal aortic aneurysm)     a. 02/2009 U/S 4.4 x 4.5 cm  . PAF (paroxysmal atrial fibrillation)     a. noted 11/2011  . Symptomatic bradycardia     a. noted 11/2011  . History of tobacco abuse     a. roughly 36 pack years, quit @ age 78.  Marland Kitchen Pancreatitis     a. 10/2011 - managed conservatively @ home.  Marland Kitchen BPH (benign prostatic hyperplasia)   . Coronary artery disease   . Myocardial infarction     "  SILENT "  . Anginal pain   . Hypothyroidism   . Shortness of breath   . Pacemaker 11/2011  . HCAP (healthcare-associated pneumonia) 11/2011  . Arthritis     "knees" (02/22/2013)  . DJD (degenerative joint disease)   . CKD (chronic kidney disease), stage III   . Systolic heart failure     02/2013  . CAP (community acquired pneumonia)     02/2013  . Non-ischemic cardiomyopathy    Family History  Problem Relation Age of Onset  . Other Father     Accidental death @ age 51 - box fell on him at work  . Cancer Mother     died @ 60 Bladder cancer   SOCIAL HISTORY: History  Substance Use Topics  . Smoking status: Former Smoker -- 1.00 packs/day for 35 years    Types: Cigarettes    Quit date: 12/17/1980  .  Smokeless tobacco: Never Used     Comment: Quit at age 64.  Marland Kitchen Alcohol Use: No   No Known Allergies Current Outpatient Prescriptions  Medication Sig Dispense Refill  . atorvastatin (LIPITOR) 80 MG tablet Take 40 mg by mouth daily at 6 PM.    . carvedilol (COREG) 3.125 MG tablet TAKE 1 TABLET (3.125 MG TOTAL) BY MOUTH 2 (TWO) TIMES DAILY WITH A MEAL. 60 tablet 0  . carvedilol (COREG) 3.125 MG tablet TAKE 1 TABLET (3.125 MG TOTAL) BY MOUTH 2 (TWO) TIMES DAILY WITH A MEAL. 60 tablet 0  . clopidogrel (PLAVIX) 75 MG tablet Take 75 mg by mouth daily with breakfast.    . famotidine (PEPCID) 20 MG tablet Take 20 mg by mouth daily.    . finasteride (PROSCAR) 5 MG tablet Take 5 mg by mouth daily.     . fish oil-omega-3 fatty acids 1000 MG capsule Take 1 g by mouth every morning.     . furosemide (LASIX) 40 MG tablet Take 1 tablet (40 mg total) by mouth daily. 30 tablet 0  . levothyroxine (SYNTHROID, LEVOTHROID) 50 MCG tablet Take 50 mcg by mouth daily before breakfast.     .  naproxen (NAPROSYN) 500 MG tablet Take 1 tablet by mouth as needed.     . nitroGLYCERIN (NITROSTAT) 0.4 MG SL tablet Place 1 tablet (0.4 mg total) under the tongue every 5 (five) minutes x 3 doses as needed for chest pain. 25 tablet 3  . polyethylene glycol powder (GLYCOLAX/MIRALAX) powder Take 17 g by mouth daily as needed for moderate constipation.     . ramipril (ALTACE) 10 MG capsule Take 10 mg by mouth daily.    . chlorpheniramine-HYDROcodone (TUSSIONEX) 10-8 MG/5ML LQCR Take 5 mLs by mouth every 12 (twelve) hours as needed for cough. (Patient not taking: Reported on 03/29/2014) 140 mL 0  . HYDROcodone-acetaminophen (NORCO/VICODIN) 5-325 MG per tablet as needed.    Marland Kitchen ipratropium (ATROVENT) 0.06 % nasal spray Place 2 sprays into both nostrils 4 (four) times daily. (Patient not taking: Reported on 03/29/2014) 15 mL 12  . mupirocin ointment (BACTROBAN) 2 % Apply 1 application topically 2 (two) times daily. Apply to left lower leg  (Patient not taking: Reported on 03/29/2014) 22 g 0   No current facility-administered medications for this visit.   REVIEW OF SYSTEMS: Arly.Keller ] denotes positive finding; [  ] denotes negative finding  CARDIOVASCULAR:  [ ]  chest pain   [ ]  chest pressure   [ ]  palpitations   [ ]  orthopnea   [ ]  dyspnea on exertion   [ ]  claudication   [ ]  rest pain   [ ]  DVT   [ ]  phlebitis PULMONARY:   [ ]  productive cough   [ ]  asthma   [ ]  wheezing NEUROLOGIC:   [ ]  weakness  [ ]  paresthesias  [ ]  aphasia  [ ]  amaurosis  [ ]  dizziness HEMATOLOGIC:   [ ]  bleeding problems   [ ]  clotting disorders MUSCULOSKELETAL:  [ ]  joint pain   [ ]  joint swelling [ ]  leg swelling GASTROINTESTINAL: [ ]   blood in stool  [ ]   hematemesis GENITOURINARY:  [ ]   dysuria  [ ]   hematuria PSYCHIATRIC:  [ ]  history of major depression INTEGUMENTARY:  [ ]  rashes  [ ]  ulcers CONSTITUTIONAL:  [ ]  fever   [ ]  chills  PHYSICAL EXAM: Filed Vitals:   03/29/14 0909  BP: 141/62  Pulse: 60  Height: 5' 10.5" (1.791 m)  Weight: 177 lb (80.287 kg)  SpO2: 100%   Body mass index is 25.03 kg/(m^2). GENERAL: The patient is a well-nourished male, in no acute distress. The vital signs are documented above. CARDIOVASCULAR: There is a regular rate and rhythm. I do not detect carotid bruits. He has palpable femoral pulses. Both feet are warm and well perfused. PULMONARY: There is good air exchange bilaterally without wheezing or rales. ABDOMEN: Soft and non-tender with normal pitched bowel sounds. His aneurysm is palpable and nontender. MUSCULOSKELETAL: There are no major deformities or cyanosis. NEUROLOGIC: No focal weakness or paresthesias are detected. SKIN: There are no ulcers or rashes noted. PSYCHIATRIC: The patient has a normal affect.  DATA:  I have independently interpreted his duplex of his abdominal aortic aneurysm. This shows that the maximum diameter of his aneurysm is 5.1 cm. This has not changed compared to the study back in  July.  MEDICAL ISSUES: The patient's abdominal aortic aneurysm remains stable at 5.1 cm. He understands we would not consider elective repair in a normal risk patient unless the aneurysm reached 5.5 cm in maximum diameter. I have ordered a follow up duplex scan in 6 months. We will see  him back at that time. He knows to call sooner if he has problems. He is not a smoker. His blood pressure is well controlled.   Return in about 6 months (around 09/27/2014).   Jhada Risk S [facility] [place]: [phone]

## 2014-03-29 NOTE — Addendum Note (Signed)
Addended by: Sharee Pimple on: 03/29/2014 01:48 PM   Modules accepted: Orders

## 2014-05-15 ENCOUNTER — Other Ambulatory Visit: Payer: Self-pay | Admitting: *Deleted

## 2014-05-15 MED ORDER — CARVEDILOL 3.125 MG PO TABS: 3.1250 mg | ORAL_TABLET | Freq: Two times a day (BID) | ORAL | Status: DC

## 2014-05-23 ENCOUNTER — Ambulatory Visit (INDEPENDENT_AMBULATORY_CARE_PROVIDER_SITE_OTHER): Payer: Medicare Other | Admitting: Internal Medicine

## 2014-05-23 ENCOUNTER — Encounter: Payer: Self-pay | Admitting: Internal Medicine

## 2014-05-23 VITALS — BP 162/66 | HR 66 | Ht 70.5 in | Wt 179.0 lb

## 2014-05-23 DIAGNOSIS — R001 Bradycardia, unspecified: Secondary | ICD-10-CM | POA: Diagnosis not present

## 2014-05-23 DIAGNOSIS — I428 Other cardiomyopathies: Secondary | ICD-10-CM

## 2014-05-23 DIAGNOSIS — I429 Cardiomyopathy, unspecified: Secondary | ICD-10-CM | POA: Diagnosis not present

## 2014-05-23 LAB — MDC_IDC_ENUM_SESS_TYPE_INCLINIC
Battery Remaining Longevity: 88.8 mo
Battery Voltage: 2.93 V
Brady Statistic RA Percent Paced: 80 %
Brady Statistic RV Percent Paced: 99.44 %
Lead Channel Pacing Threshold Amplitude: 0.375 V
Lead Channel Sensing Intrinsic Amplitude: 5 mV
Lead Channel Setting Pacing Amplitude: 0.875
Lead Channel Setting Pacing Amplitude: 1.375
Lead Channel Setting Pacing Pulse Width: 0.4 ms
MDC IDC MSMT LEADCHNL RA IMPEDANCE VALUE: 462.5 Ohm
MDC IDC MSMT LEADCHNL RA PACING THRESHOLD PULSEWIDTH: 0.4 ms
MDC IDC MSMT LEADCHNL RV IMPEDANCE VALUE: 562.5 Ohm
MDC IDC MSMT LEADCHNL RV PACING THRESHOLD AMPLITUDE: 0.625 V
MDC IDC MSMT LEADCHNL RV PACING THRESHOLD PULSEWIDTH: 0.4 ms
MDC IDC MSMT LEADCHNL RV SENSING INTR AMPL: 12 mV
MDC IDC PG SERIAL: 7394618
MDC IDC SESS DTM: 20160406154906
MDC IDC SET LEADCHNL RV SENSING SENSITIVITY: 2 mV

## 2014-05-23 MED ORDER — RAMIPRIL 10 MG PO CAPS: ORAL_CAPSULE | ORAL | Status: DC

## 2014-05-23 NOTE — Patient Instructions (Signed)
Your physician has recommended you make the following change in your medication:  1) Increase ramipril to 20 mg once daily  Remote monitoring is used to monitor your Pacemaker of ICD from home. This monitoring reduces the number of office visits required to check your device to one time per year. It allows [place] to keep an eye on the functioning of your device to ensure it is working properly. You are scheduled for a device check from home on 08/22/14. You may send your transmission at any time that day. If you have a wireless device, the transmission will be sent automatically. After your physician reviews your transmission, you will receive a postcard with your next transmission date.  Your physician wants you to follow-up in: 1 year with Gypsy Balsam, NP for Dr. Johney Frame. You will receive a reminder letter in the mail two months in advance. If you don't receive a letter, please call our office to schedule the follow-up appointment.

## 2014-05-23 NOTE — Progress Notes (Signed)
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 male who presents today for electrophysiology followup.   His EF is chronically decreased.  He remains active.  He continues to mow his yard and do water exercises.  Today, he denies symptoms of palpitations, chest pain, shortness of breath,  lower extremity edema, dizziness, presyncope, or syncope.  The patient is otherwise without complaint today.   Past Medical History  Diagnosis Date  . Hypertension   . AAA (abdominal aortic aneurysm)     a. 02/2009 U/S 4.4 x 4.5 cm  . PAF (paroxysmal atrial fibrillation)     a. noted 11/2011  . Symptomatic bradycardia     a. noted 11/2011  . History of tobacco abuse     a. roughly 36 pack years, quit @ age 89.  Marland Kitchen Pancreatitis     a. 10/2011 - managed conservatively @ home.  Marland Kitchen BPH (benign prostatic hyperplasia)   . Coronary artery disease   . Myocardial infarction     "  SILENT "  . Anginal pain   . Hypothyroidism   . Shortness of breath   . Pacemaker 11/2011  . HCAP (healthcare-associated pneumonia) 11/2011  . Arthritis     "knees" (02/22/2013)  . DJD (degenerative joint disease)   . CKD (chronic kidney disease), stage III   . Systolic heart failure     02/2013  . CAP (community acquired pneumonia)     02/2013  . Non-ischemic cardiomyopathy    Past Surgical History  Procedure Laterality Date  . Coronary angioplasty with stent placement  12/22/2011     LAD & CIRCUMFLEX  . Insert / replace / remove pacemaker  11/20/2011    Implanted by [name]   . Tonsillectomy  ~ 1933  . Cataract extraction w/ intraocular lens  implant, bilateral Bilateral 1980's  . Eye surgery    . Coronary angiogram  11/19/2011    Procedure: CORONARY ANGIOGRAM;  Surgeon: [name], MD;  Location: [facility] CATH LAB;  Service: Cardiovascular;;  . Permanent pacemaker insertion N/A 11/20/2011    Procedure: PERMANENT PACEMAKER INSERTION;  Surgeon: Hillis Range, MD;  Location: [facility]  CATH LAB;  Service: Cardiovascular;  Laterality: N/A;  . Percutaneous coronary stent intervention (pci-s) N/A 12/22/2011    Procedure: PERCUTANEOUS CORONARY STENT INTERVENTION (PCI-S);  Surgeon: [name], MD;  Location: [facility] CATH LAB;  Service: Cardiovascular;  Laterality: N/A;    Current Outpatient Prescriptions  Medication Sig Dispense Refill  . atorvastatin (LIPITOR) 80 MG tablet Take 40 mg by mouth daily at 6 PM.    . carvedilol (COREG) 3.125 MG tablet Take 1 tablet (3.125 mg total) by mouth 2 (two) times daily with a meal. 60 tablet 0  . clopidogrel (PLAVIX) 75 MG tablet Take 75 mg by mouth daily with breakfast.    . famotidine (PEPCID) 20 MG tablet Take 20 mg by mouth daily.    . finasteride (PROSCAR) 5 MG tablet Take 5 mg by mouth daily.     . fish oil-omega-3 fatty acids 1000 MG capsule Take 1 g by mouth every morning.     . furosemide (LASIX) 40 MG tablet Take 1 tablet (40 mg total) by mouth daily. 30 tablet 0  . levothyroxine (SYNTHROID, LEVOTHROID) 75 MCG tablet Take 75 mcg by mouth daily before breakfast.  5  . Naproxen Sodium 220 MG CAPS Take 1 capsule by mouth daily as needed (pain).    . nitroGLYCERIN (NITROSTAT)  0.4 MG SL tablet Place 1 tablet (0.4 mg total) under the tongue every 5 (five) minutes x 3 doses as needed for chest pain. 25 tablet 3  . polyethylene glycol powder (GLYCOLAX/MIRALAX) powder Take 17 g by mouth daily as needed for moderate constipation.     . ramipril (ALTACE) 10 MG capsule Take 10 mg by mouth daily.     No current facility-administered medications for this visit.   ROS- all systems are reviewed and negative except as per HPI above  Physical Exam: Filed Vitals:   05/23/14 1149  BP: 162/66  Pulse: 66  Height: 5' 10.5" (1.791 m)  Weight: 179 lb (81.194 kg)    GEN- The patient is well appearing, alert and oriented x 3 today.   Head- normocephalic, atraumatic Eyes-  Sclera clear, conjunctiva pink Ears- hearing intact Oropharynx-  clear Lungs- Clear to ausculation bilaterally, normal work of breathing Chest- pacemaker pocket is well healed Heart- Regular rate and rhythm, no murmurs, rubs or gallops, PMI not laterally displaced GI- soft, NT, ND, + BS Extremities- no clubbing, cyanosis, or edema  Pacemaker interrogation- reviewed in detail today,  See PACEART report  Assessment and Plan:  1. Symptomatic sinus bradycardia Normal pacemaker function See Pace Art report No changes today  2. Chronic systolic dysfunction Increase ramipril today to 20mg  daily Given advanced age, he is a poor candidate for any EP procedures.  I have discouraged device upgrade to CRT-P today. A more conservative approach is appropriate.  3. ? afib He has had no sustained afib since his last visit to our office   Merlin Return in 12 months to [facility] Balsam NP  Other cardiology care per Dr [name]

## 2014-06-20 ENCOUNTER — Other Ambulatory Visit: Payer: Self-pay

## 2014-06-20 MED ORDER — CARVEDILOL 3.125 MG PO TABS: 3.1250 mg | ORAL_TABLET | Freq: Two times a day (BID) | ORAL | Status: DC

## 2014-08-22 ENCOUNTER — Ambulatory Visit (INDEPENDENT_AMBULATORY_CARE_PROVIDER_SITE_OTHER): Payer: Medicare Other | Admitting: *Deleted

## 2014-08-22 ENCOUNTER — Encounter: Payer: Self-pay | Admitting: Internal Medicine

## 2014-08-22 DIAGNOSIS — I429 Cardiomyopathy, unspecified: Secondary | ICD-10-CM

## 2014-08-22 DIAGNOSIS — I428 Other cardiomyopathies: Secondary | ICD-10-CM

## 2014-08-22 NOTE — Progress Notes (Signed)
Remote pacemaker transmission.   

## 2014-08-26 LAB — CUP PACEART REMOTE DEVICE CHECK
Battery Remaining Longevity: 90 mo
Battery Remaining Percentage: 73 %
Brady Statistic AP VP Percent: 83 %
Brady Statistic AP VS Percent: 1 %
Brady Statistic RV Percent Paced: 99 %
Date Time Interrogation Session: 20160706070348
Lead Channel Impedance Value: 450 Ohm
Lead Channel Pacing Threshold Amplitude: 0.75 V
Lead Channel Pacing Threshold Pulse Width: 0.4 ms
Lead Channel Sensing Intrinsic Amplitude: 12 mV
Lead Channel Sensing Intrinsic Amplitude: 4 mV
Lead Channel Setting Pacing Amplitude: 1 V
Lead Channel Setting Pacing Pulse Width: 0.4 ms
MDC IDC MSMT BATTERY VOLTAGE: 2.92 V
MDC IDC MSMT LEADCHNL RA PACING THRESHOLD AMPLITUDE: 0.5 V
MDC IDC MSMT LEADCHNL RA PACING THRESHOLD PULSEWIDTH: 0.4 ms
MDC IDC MSMT LEADCHNL RV IMPEDANCE VALUE: 540 Ohm
MDC IDC SET LEADCHNL RA PACING AMPLITUDE: 1.5 V
MDC IDC SET LEADCHNL RV SENSING SENSITIVITY: 2 mV
MDC IDC STAT BRADY AS VP PERCENT: 17 %
MDC IDC STAT BRADY AS VS PERCENT: 1 %
MDC IDC STAT BRADY RA PERCENT PACED: 82 %
Pulse Gen Model: 2210
Pulse Gen Serial Number: 7394618

## 2014-08-29 ENCOUNTER — Encounter: Payer: Self-pay | Admitting: Cardiology

## 2014-09-20 ENCOUNTER — Encounter: Payer: Self-pay | Admitting: Cardiology

## 2014-09-25 ENCOUNTER — Encounter: Payer: Self-pay | Admitting: Vascular Surgery

## 2014-09-26 ENCOUNTER — Ambulatory Visit (HOSPITAL_COMMUNITY)
Admission: RE | Admit: 2014-09-26 | Discharge: 2014-09-26 | Disposition: A | Discharge status: Home / Self Care | Payer: Medicare Other | Source: Ambulatory Visit | Attending: Vascular Surgery | Admitting: Vascular Surgery

## 2014-09-26 ENCOUNTER — Encounter: Payer: Self-pay | Admitting: Vascular Surgery

## 2014-09-26 ENCOUNTER — Ambulatory Visit (INDEPENDENT_AMBULATORY_CARE_PROVIDER_SITE_OTHER): Payer: Medicare Other | Admitting: Vascular Surgery

## 2014-09-26 VITALS — BP 141/88 | HR 67 | Ht 70.5 in | Wt 174.4 lb

## 2014-09-26 DIAGNOSIS — I714 Abdominal aortic aneurysm, without rupture, unspecified: Secondary | ICD-10-CM

## 2014-09-26 NOTE — Progress Notes (Signed)
[facility]  Patient name: Connor Diaz MRN: [id number] DOB: [birth year] Sex: male  REASON FOR VISIT: Follow up of abdominal aortic aneurysm  HPI: Connor Diaz is a 81 y.o. male who I have been following with an abdominal aortic aneurysm. I last saw him in February of this year. At that time, the aneurysm measured 5.1 cm in maximum diameter. It was stable in size and I set him up for a 6 month follow up visit. Since I saw him last, he denies any history of abdominal pain or back pain.  He does have a history of congestive heart failure but his most recent issue was 3-4 years ago. He has also undergone previous PTCA. His cardiologist is Dr. Johney Frame.  Past Medical History  Diagnosis Date  . Hypertension   . AAA (abdominal aortic aneurysm)     a. 02/2009 U/S 4.4 x 4.5 cm  . PAF (paroxysmal atrial fibrillation)     a. noted 11/2011  . Symptomatic bradycardia     a. noted 11/2011  . History of tobacco abuse     a. roughly 36 pack years, quit @ age 47.  Marland Kitchen Pancreatitis     a. 10/2011 - managed conservatively @ home.  Marland Kitchen BPH (benign prostatic hyperplasia)   . Coronary artery disease   . Myocardial infarction     "  SILENT "  . Anginal pain   . Hypothyroidism   . Shortness of breath   . Pacemaker 11/2011  . HCAP (healthcare-associated pneumonia) 11/2011  . Arthritis     "knees" (02/22/2013)  . DJD (degenerative joint disease)   . CKD (chronic kidney disease), stage III   . Systolic heart failure     02/2013  . CAP (community acquired pneumonia)     02/2013  . Non-ischemic cardiomyopathy    Family History  Problem Relation Age of Onset  . Other Father     Accidental death @ age 77 - box fell on him at work  . Cancer Mother     died @ 60 Bladder cancer   SOCIAL HISTORY: Social History  Substance Use Topics  . Smoking status: Former Smoker -- 1.00 packs/day for 35 years    Types: Cigarettes    Quit date: 12/17/1980  . Smokeless tobacco:  Never Used     Comment: Quit at age 32.  Marland Kitchen Alcohol Use: No   No Known Allergies Current Outpatient Prescriptions  Medication Sig Dispense Refill  . atorvastatin (LIPITOR) 80 MG tablet Take 40 mg by mouth daily at 6 PM.    . carvedilol (COREG) 3.125 MG tablet Take 1 tablet (3.125 mg total) by mouth 2 (two) times daily with a meal. 60 tablet 3  . clopidogrel (PLAVIX) 75 MG tablet Take 75 mg by mouth daily with breakfast.    . famotidine (PEPCID) 20 MG tablet Take 20 mg by mouth daily.    . finasteride (PROSCAR) 5 MG tablet Take 5 mg by mouth daily.     . fish oil-omega-3 fatty acids 1000 MG capsule Take 1 g by mouth every morning.     . furosemide (LASIX) 40 MG tablet Take 1 tablet (40 mg total) by mouth daily. 30 tablet 0  . ipratropium (ATROVENT) 0.06 % nasal spray PLACE 2 SPRAYS INTO BOTH NOSTRILS 4 (FOUR) TIMES DAILY.  12  . levothyroxine (SYNTHROID, LEVOTHROID) 75 MCG tablet Take 75 mcg by mouth daily before breakfast.  5  . Naproxen Sodium 220 MG  CAPS Take 1 capsule by mouth daily as needed (pain).    . nitroGLYCERIN (NITROSTAT) 0.4 MG SL tablet Place 1 tablet (0.4 mg total) under the tongue every 5 (five) minutes x 3 doses as needed for chest pain. 25 tablet 3  . polyethylene glycol powder (GLYCOLAX/MIRALAX) powder Take 17 g by mouth daily as needed for moderate constipation.     . ramipril (ALTACE) 10 MG capsule Take two capsules (20 mg) by mouth once daily 180 capsule 3   No current facility-administered medications for this visit.   REVIEW OF SYSTEMS: Arly.[email or web address] ] denotes positive finding; [  ] denotes negative finding  CARDIOVASCULAR:  [ ]  chest pain   [ ]  chest pressure   [ ]  palpitations   [ ]  orthopnea   [ ]  dyspnea on exertion   [ ]  claudication   [ ]  rest pain   [ ]  DVT   [ ]  phlebitis PULMONARY:   [ ]  productive cough   [ ]  asthma   [ ]  wheezing NEUROLOGIC:   [ ]  weakness  [ ]  paresthesias  [ ]  aphasia  [ ]  amaurosis  [ ]  dizziness HEMATOLOGIC:   [ ]  bleeding problems   [ ]   clotting disorders MUSCULOSKELETAL:  Arly.Keller ] joint pain - both knees  [ X] joint swelling [ ]  leg swelling GASTROINTESTINAL: [ ]   blood in stool  [ ]   hematemesis GENITOURINARY:  [ ]   dysuria  [ ]   hematuria PSYCHIATRIC:  [ ]  history of major depression INTEGUMENTARY:  [ ]  rashes  [ ]  ulcers CONSTITUTIONAL:  [ ]  fever   [ ]  chills  PHYSICAL EXAM: Filed Vitals:   09/26/14 0916 09/26/14 0917  BP: 162/90 141/88  Pulse: 67   Height: 5' 10.5" (1.791 m)   Weight: 174 lb 6.4 oz (79.107 kg)   SpO2: 96%    GENERAL: The patient is a well-nourished male, in no acute distress. The vital signs are documented above. CARDIAC: There is a regular rate and rhythm.  VASCULAR: I do not detect carotid bruits. He has palpable femoral pulses. Both feet are warm and well perfused. PULMONARY: There is good air exchange bilaterally without wheezing or rales. ABDOMEN: Soft and non-tender with normal pitched bowel sounds. His aneurysm is palpable and nontender. MUSCULOSKELETAL: There are no major deformities or cyanosis. NEUROLOGIC: No focal weakness or paresthesias are detected. SKIN: There are no ulcers or rashes noted. PSYCHIATRIC: The patient has a normal affect.  DATA:  I have independently interpreted his duplex of his aneurysm today which shows that the maximum diameter is 5.5 cm. This has increased from 5.1 cm 6 months ago.  MEDICAL ISSUES:  ASYMPTOMATIC 5.5 CM INFRARENAL ABDOMINAL AORTIC ANEURYSM: His aneurysm has enlarged slightly to 5.5 cm. I have explained that in a normal risk patient we would consider elective repair at 5.5 cm. He would be at increased risk for surgery because of his age and cardiac history. However he is fairly active and lives at home with his wife and is in good shape for his age. Therefore we discuss potentially proceeding with a CT angiogram in order to determine if he is a candidate for an endovascular repair of his aneurysm. However, at this point he does not one to consider  elective repair and would prefer to have a follow up study in 6 months. I do not think that this is unreasonable. I have ordered a CT Angio of the abdomen and pelvis in 6 months and also  her back at that time. If the aneurysm continues to enlarge that I think he should be considered for elective repair, especially if he is a candidate for endovascular repair.  HYPERTENSION: The patient's initial blood pressure today was elevated. We repeated this and this was still elevated. We have encouraged the patient to follow up with their primary care physician for management of their blood pressure.   Return in about 6 months (around 03/29/2015).   Waverly Ferrari [facility] [place] Beeper: [phone]

## 2014-09-26 NOTE — Addendum Note (Signed)
Addended by: Adria Dill L on: 09/26/2014 10:49 AM   Modules accepted: Orders

## 2014-09-26 NOTE — Patient Instructions (Addendum)
Abdominal Aortic Aneurysm An aneurysm is a weakened or damaged part of an artery wall that bulges from the normal force of blood pumping through the body. An abdominal aortic aneurysm is an aneurysm that occurs in the lower part of the aorta, the main artery of the body.  The major concern with an abdominal aortic aneurysm is that it can enlarge and burst (rupture) or blood can flow between the layers of the wall of the aorta through a tear (aorticdissection). Both of these conditions can cause bleeding inside the body and can be life threatening unless diagnosed and treated promptly. CAUSES  The exact cause of an abdominal aortic aneurysm is unknown. Some contributing factors are:   A hardening of the arteries caused by the buildup of fat and other substances in the lining of a blood vessel (arteriosclerosis).  Inflammation of the walls of an artery (arteritis).   Connective tissue diseases, such as Marfan syndrome.   Abdominal trauma.   An infection, such as syphilis or staphylococcus, in the wall of the aorta (infectious aortitis) caused by bacteria. RISK FACTORS  Risk factors that contribute to an abdominal aortic aneurysm may include:  Age older than 60 years.   High blood pressure (hypertension).  Male gender.  Ethnicity (white race).  Obesity.  Family history of aneurysm (first degree relatives only).  Tobacco use. PREVENTION  The following healthy lifestyle habits may help decrease your risk of abdominal aortic aneurysm:  Quitting smoking. Smoking can raise your blood pressure and cause arteriosclerosis.  Limiting or avoiding alcohol.  Keeping your blood pressure, blood sugar level, and cholesterol levels within normal limits.  Decreasing your salt intake. In somepeople, too much salt can raise blood pressure and increase your risk of abdominal aortic aneurysm.  Eating a diet low in saturated fats and cholesterol.  Increasing your fiber intake by including  whole grains, vegetables, and fruits in your diet. Eating these foods may help lower blood pressure.  Maintaining a healthy weight.  Staying physically active and exercising regularly. SYMPTOMS  The symptoms of abdominal aortic aneurysm may vary depending on the size and rate of growth of the aneurysm.Most grow slowly and do not have any symptoms. When symptoms do occur, they may include:  Pain (abdomen, side, lower back, or groin). The pain may vary in intensity. A sudden onset of severe pain may indicate that the aneurysm has ruptured.  Feeling full after eating only small amounts of food.  Nausea or vomiting or both.  Feeling a pulsating lump in the abdomen.  Feeling faint or passing out. DIAGNOSIS  Since most unruptured abdominal aortic aneurysms have no symptoms, they are often discovered during diagnostic exams for other conditions. An aneurysm may be found during the following procedures:  Ultrasonography (A one-time screening for abdominal aortic aneurysm by ultrasonography is also recommended for all men aged 65-75 years who have ever smoked).  X-ray exams.  A computed tomography (CT).  Magnetic resonance imaging (MRI).  Angiography or arteriography. TREATMENT  Treatment of an abdominal aortic aneurysm depends on the size of your aneurysm, your age, and risk factors for rupture. Medication to control blood pressure and pain may be used to manage aneurysms smaller than 6 cm. Regular monitoring for enlargement may be recommended by your caregiver if:  The aneurysm is 3-4 cm in size (an annual ultrasonography may be recommended).  The aneurysm is 4-4.5 cm in size (an ultrasonography every 6 months may be recommended).  The aneurysm is larger than 4.5 cm in   size (your caregiver may ask that you be examined by a vascular surgeon). If your aneurysm is larger than 6 cm, surgical repair may be recommended. There are two main methods for repair of an aneurysm:   Endovascular  repair (a minimally invasive surgery). This is done most often.  Open repair. This method is used if an endovascular repair is not possible. Document Released: 11/12/2004 Document Revised: 05/30/2012 Document Reviewed: 03/04/2012 [facility] Patient Information 2015 [place], [place]. This information is not intended to replace advice given to you by your health care provider. Make sure you discuss any questions you have with your health care provider.     Cardiac Diet This diet can help prevent heart disease and stroke. Many factors influence your heart health, including eating and exercise habits. Coronary risk rises a lot with abnormal blood fat (lipid) levels. Cardiac meal planning includes limiting unhealthy fats, increasing healthy fats, and making other small dietary changes. General guidelines are as follows:  Adjust calorie intake to reach and maintain desirable body weight.  Limit total fat intake to less than 30% of total calories. Saturated fat should be less than 7% of calories.  Saturated fats are found in animal products and in some vegetable products. Saturated vegetable fats are found in coconut oil, cocoa butter, palm oil, and palm kernel oil. Read labels carefully to avoid these products as much as possible. Use butter in moderation. Choose tub margarines and oils that have 2 grams of fat or less. Good cooking oils are canola and olive oils.  Practice low-fat cooking techniques. Do not fry food. Instead, broil, bake, boil, steam, grill, roast on a rack, stir-fry, or microwave it. Other fat reducing suggestions include:  Remove the skin from poultry.  Remove all visible fat from meats.  Skim the fat off stews, soups, and gravies before serving them.  Steam vegetables in water or broth instead of sauting them in fat.  Avoid foods with trans fat (or hydrogenated oils), such as commercially fried foods and commercially baked goods. Commercial shortening and deep-frying fats  will contain trans fat.  Increase intake of fruits, vegetables, whole grains, and legumes to replace foods high in fat.  Increase consumption of nuts, legumes, and seeds to at least 4 servings weekly. One serving of a legume equals  cup, and 1 serving of nuts or seeds equals  cup.  Choose whole grains more often. Have 3 servings per day (a serving is 1 ounce [oz]).  Eat 4 to 5 servings of vegetables per day. A serving of vegetables is 1 cup of raw leafy vegetables;  cup of raw or cooked cut-up vegetables;  cup of vegetable juice.  Eat 4 to 5 servings of fruit per day. A serving of fruit is 1 medium whole fruit;  cup of dried fruit;  cup of fresh, frozen, or canned fruit;  cup of 100% fruit juice.  Increase your intake of dietary fiber to 20 to 30 grams per day. Insoluble fiber may help lower your risk of heart disease and may help curb your appetite.  Soluble fiber binds cholesterol to be removed from the blood. Foods high in soluble fiber are dried beans, citrus fruits, oats, apples, bananas, broccoli, Brussels sprouts, and eggplant.  Try to include foods fortified with plant sterols or stanols, such as yogurt, breads, juices, or margarines. Choose several fortified foods to achieve a daily intake of 2 to 3 grams of plant sterols or stanols.  Foods with omega-3 fats can help reduce your risk  of heart disease. Aim to have a 3.5 oz portion of fatty fish twice per week, such as salmon, mackerel, albacore tuna, sardines, lake trout, or herring. If you wish to take a fish oil supplement, choose one that contains 1 gram of both DHA and EPA.  Limit processed meats to 2 servings (3 oz portion) weekly.  Limit the sodium in your diet to 1500 milligrams (mg) per day. If you have high blood pressure, talk to a registered dietitian about a DASH (Dietary Approaches to Stop Hypertension) eating plan.  Limit sweets and beverages with added sugar, such as soda, to no more than 5 servings per week. One  serving is:   1 tablespoon sugar.  1 tablespoon jelly or jam.   cup sorbet.  1 cup lemonade.   cup regular soda. CHOOSING FOODS Starches  Allowed: Breads: All kinds (wheat, rye, raisin, white, oatmeal, [place], [place], and English muffin bread). Low-fat rolls: English muffins, frankfurter and hamburger buns, bagels, pita bread, tortillas (not fried). Pancakes, waffles, biscuits, and muffins made with recommended oil.  Avoid: Products made with saturated or trans fats, oils, or whole milk products. Butter rolls, cheese breads, croissants. Commercial doughnuts, muffins, sweet rolls, biscuits, waffles, pancakes, store-bought mixes. Crackers  Allowed: Low-fat crackers and snacks: Animal, graham, rye, saltine (with recommended oil, no lard), oyster, and matzo crackers. Bread sticks, melba toast, rusks, flatbread, pretzels, and light popcorn.  Avoid: High-fat crackers: cheese crackers, butter crackers, and those made with coconut, palm oil, or trans fat (hydrogenated oils). Buttered popcorn. Cereals  Allowed: Hot or cold whole-grain cereals.  Avoid: Cereals containing coconut, hydrogenated vegetable fat, or animal fat. Potatoes / Pasta / Rice  Allowed: All kinds of potatoes, rice, and pasta (such as macaroni, spaghetti, and noodles).  Avoid: Pasta or rice prepared with cream sauce or high-fat cheese. Chow mein noodles, [place] fries. Vegetables  Allowed: All vegetables and vegetable juices.  Avoid: Fried vegetables. Vegetables in cream, butter, or high-fat cheese sauces. Limit coconut. Fruit in cream or custard. Protein  Allowed: Limit your intake of meat, seafood, and poultry to no more than 6 oz (cooked weight) per day. All lean, well-trimmed beef, veal, pork, and lamb. All chicken and [place] without skin. All fish and shellfish. Wild game: wild duck, rabbit, pheasant, and venison. Egg whites or low-cholesterol egg substitutes may be used as desired. Meatless dishes: recipes with  dried beans, peas, lentils, and tofu (soybean curd). Seeds and nuts: all seeds and most nuts.  Avoid: Prime grade and other heavily marbled and fatty meats, such as short ribs, spare ribs, rib eye roast or steak, frankfurters, sausage, bacon, and high-fat luncheon meats, mutton. Caviar. Commercially fried fish. Domestic duck, goose, venison sausage. Organ meats: liver, gizzard, heart, chitterlings, brains, kidney, sweetbreads. Dairy  Allowed: Low-fat cheeses: nonfat or low-fat cottage cheese (1% or 2% fat), cheeses made with part skim milk, such as mozzarella, farmers, string, or ricotta. (Cheeses should be labeled no more than 2 to 6 grams fat per oz.). Skim (or 1%) milk: liquid, powdered, or evaporated. Buttermilk made with low-fat milk. Drinks made with skim or low-fat milk or cocoa. Chocolate milk or cocoa made with skim or low-fat (1%) milk. Nonfat or low-fat yogurt.  Avoid: Whole milk cheeses, including colby, cheddar, [place], [address], [place], [place], [place], [address], [place], and blue. Creamed cottage cheese, cream cheese. Whole milk and whole milk products, including buttermilk or yogurt made from whole milk, drinks made from whole milk. Condensed milk, evaporated whole milk, and 2% milk.  Soups and Combination Foods  Allowed: Low-fat low-sodium soups: broth, dehydrated soups, homemade broth, soups with the fat removed, homemade cream soups made with skim or low-fat milk. Low-fat spaghetti, lasagna, chili, and Spanish rice if low-fat ingredients and low-fat cooking techniques are used.  Avoid: Cream soups made with whole milk, cream, or high-fat cheese. All other soups. Desserts and Sweets  Allowed: Sherbet, fruit ices, gelatins, meringues, and angel food cake. Homemade desserts with recommended fats, oils, and milk products. Jam, jelly, honey, marmalade, sugars, and syrups. Pure sugar candy, such as gum drops, hard candy, jelly beans, marshmallows, mints, and small amounts of dark  chocolate.  Avoid: Commercially prepared cakes, pies, cookies, frosting, pudding, or mixes for these products. Desserts containing whole milk products, chocolate, coconut, lard, palm oil, or palm kernel oil. Ice cream or ice cream drinks. Candy that contains chocolate, coconut, butter, hydrogenated fat, or unknown ingredients. Buttered syrups. Fats and Oils  Allowed: Vegetable oils: safflower, sunflower, corn, soybean, cottonseed, sesame, canola, olive, or peanut. Non-hydrogenated margarines. Salad dressing or mayonnaise: homemade or commercial, made with a recommended oil. Low or nonfat salad dressing or mayonnaise.  Limit added fats and oils to 6 to 8 tsp per day (includes fats used in cooking, baking, salads, and spreads on bread). Remember to count the "hidden fats" in foods.  Avoid: Solid fats and shortenings: butter, lard, salt pork, bacon drippings. Gravy containing meat fat, shortening, or suet. Cocoa butter, coconut. Coconut oil, palm oil, palm kernel oil, or hydrogenated oils: these ingredients are often used in bakery products, nondairy creamers, whipped toppings, candy, and commercially fried foods. Read labels carefully. Salad dressings made of unknown oils, sour cream, or cheese, such as blue cheese and Roquefort. Cream, all kinds: half-and-half, light, heavy, or whipping. Sour cream or cream cheese (even if "light" or low-fat). Nondairy cream substitutes: coffee creamers and sour cream substitutes made with palm, palm kernel, hydrogenated oils, or coconut oil. Beverages  Allowed: Coffee (regular or decaffeinated), tea. Diet carbonated beverages, mineral water. Alcohol: Check with your caregiver. Moderation is recommended.  Avoid: Whole milk, regular sodas, and juice drinks with added sugar. Condiments  Allowed: All seasonings and condiments. Cocoa powder. "Cream" sauces made with recommended ingredients.  Avoid: Carob powder made with hydrogenated fats. SAMPLE MENU Breakfast    cup orange juice   cup oatmeal  1 slice toast  1 tsp margarine  1 cup skim milk Lunch  [place] sandwich with 2 oz [place], 2 slices bread  Lettuce and tomato slices  Fresh fruit  Carrot sticks  Coffee or tea Snack  Fresh fruit or low-fat crackers Dinner  3 oz lean ground beef  1 baked potato  1 tsp margarine   cup asparagus  Lettuce salad  1 tbs non-creamy dressing   cup peach slices  1 cup skim milk Document Released: 11/12/2007 Document Revised: 08/04/2011 Document Reviewed: 04/04/2013 [employer] Patient Information 2015 [place], [place]. This information is not intended to replace advice given to you by your health care provider. Make sure you discuss any questions you have with your health care provider.

## 2014-10-05 ENCOUNTER — Telehealth: Payer: Self-pay | Admitting: Vascular Surgery

## 2014-10-05 NOTE — Telephone Encounter (Signed)
-----   Message from Sharee Pimple, RN sent at 10/01/2014 11:09 AM EDT ----- Regarding: RE: Order There are 2 orders in EPIC for this CTA, I think you can use either one. If not let me know and I'll put another one in. ----- Message -----    From: Fredrich Birks    Sent: 10/01/2014  10:19 AM      To: [facility] Subject: Order                                          This gentleman was here recently and Dr Edilia Bo offered a CTA to evaluated enlarging AAA within the next few weeks, however patient declined and asked to schedule for 6 months out.  His wife is now calling stating that they want to go ahead and have it evaluated now. Could you please enter and/or correct the CTA order so that I can get this set up for him?  Thank you! Annabelle Harman

## 2014-10-08 ENCOUNTER — Telehealth: Payer: Self-pay | Admitting: *Deleted

## 2014-10-08 NOTE — Telephone Encounter (Signed)
Darl Pikes, x ray tech at [facility] office calling for PPM clearance for RF ablation on knee.  Will clear with Dr. Johney Frame and formulate form to fax.

## 2014-10-13 ENCOUNTER — Other Ambulatory Visit: Payer: Self-pay | Admitting: Internal Medicine

## 2014-10-17 ENCOUNTER — Other Ambulatory Visit: Payer: Self-pay | Admitting: Vascular Surgery

## 2014-10-17 LAB — BUN: BUN: 35 mg/dL — ABNORMAL HIGH (ref 7–25)

## 2014-10-17 LAB — CREATININE, SERUM: CREATININE: 1.38 mg/dL — AB (ref 0.70–1.11)

## 2014-10-24 ENCOUNTER — Ambulatory Visit
Admission: RE | Admit: 2014-10-24 | Discharge: 2014-10-24 | Disposition: A | Discharge status: Home / Self Care | Payer: Medicare Other | Source: Ambulatory Visit | Attending: Vascular Surgery | Admitting: Vascular Surgery

## 2014-10-24 DIAGNOSIS — I714 Abdominal aortic aneurysm, without rupture, unspecified: Secondary | ICD-10-CM

## 2014-10-24 MED ORDER — IOPAMIDOL (ISOVUE-370) INJECTION 76%
75.0000 mL | Freq: Once | INTRAVENOUS | Status: AC | PRN
  Administered 2014-10-24: 75 mL via INTRAVENOUS

## 2014-10-31 ENCOUNTER — Encounter: Payer: Self-pay | Admitting: Vascular Surgery

## 2014-10-31 ENCOUNTER — Ambulatory Visit (INDEPENDENT_AMBULATORY_CARE_PROVIDER_SITE_OTHER): Payer: Medicare Other | Admitting: Vascular Surgery

## 2014-10-31 VITALS — BP 163/80 | HR 59 | Temp 98.1°F | Ht 70.5 in | Wt 177.9 lb

## 2014-10-31 DIAGNOSIS — I714 Abdominal aortic aneurysm, without rupture, unspecified: Secondary | ICD-10-CM

## 2014-10-31 NOTE — Progress Notes (Signed)
[facility]  Patient name: Connor Diaz MRN: [id number] DOB: September 19, 1923 Sex: male  REASON FOR VISIT: Follow up of abdominal aortic aneurysm  HPI: Connor Diaz is a 81 y.o. male I just saw on 09/26/2014. His aneurysm had enlarged to 5.5 cm.  This had increased from 5.1 cm 6 months ago. I explained that in a normal risk patient we would consider elective repair at 5.5 cm. I think he would be higher risk given his age and cardiac history however. Initially he only wanted to follow up in 6 months as I had suggested a CT angiogram. He subsequent decided to proceed with the CT angiogram. He comes in today to discuss those results.  He denies any abdominal pain or back pain.   Past Medical History  Diagnosis Date  . Hypertension   . AAA (abdominal aortic aneurysm)     a. 02/2009 U/S 4.4 x 4.5 cm  . PAF (paroxysmal atrial fibrillation)     a. noted 11/2011  . Symptomatic bradycardia     a. noted 11/2011  . History of tobacco abuse     a. roughly 36 pack years, quit @ age 89.  Marland Kitchen Pancreatitis     a. 10/2011 - managed conservatively @ home.  Marland Kitchen BPH (benign prostatic hyperplasia)   . Coronary artery disease   . Myocardial infarction     "  SILENT "  . Anginal pain   . Hypothyroidism   . Shortness of breath   . Pacemaker 11/2011  . HCAP (healthcare-associated pneumonia) 11/2011  . Arthritis     "knees" (02/22/2013)  . DJD (degenerative joint disease)   . CKD (chronic kidney disease), stage III   . Systolic heart failure     02/2013  . CAP (community acquired pneumonia)     02/2013  . Non-ischemic cardiomyopathy    Family History  Problem Relation Age of Onset  . Other Father     Accidental death @ age 70 - box fell on him at work  . Cancer Mother     died @ 35 Bladder cancer   SOCIAL HISTORY: Social History  Substance Use Topics  . Smoking status: Former Smoker -- 1.00 packs/day for 35 years    Types: Cigarettes    Quit date: 12/17/1980    . Smokeless tobacco: Never Used     Comment: Quit at age 64.  Marland Kitchen Alcohol Use: No   Allergies  Allergen Reactions  . Isovue [Iopamidol] Itching and Swelling    Pt had slight erythema, itching and facial swelling right ear and right lower chin.  Pt sneezed several times after contrast injection.  Pt was monitored for 1 hr post injection and was given water to drink. Dr Gery Pray feels he had a very mild reaction.  Premeds are warranted in this patient.  Thanks, Gildardo Griffes   Current Outpatient Prescriptions  Medication Sig Dispense Refill  . atorvastatin (LIPITOR) 80 MG tablet Take 40 mg by mouth daily at 6 PM.    . carvedilol (COREG) 3.125 MG tablet TAKE 1 TABLET (3.125 MG TOTAL) BY MOUTH 2 (TWO) TIMES DAILY WITH A MEAL. 60 tablet 3  . clopidogrel (PLAVIX) 75 MG tablet Take 75 mg by mouth daily with breakfast.    . famotidine (PEPCID) 20 MG tablet Take 20 mg by mouth daily.    . finasteride (PROSCAR) 5 MG tablet Take 5 mg by mouth daily.     . fish oil-omega-3 fatty acids 1000  MG capsule Take 1 g by mouth every morning.     . furosemide (LASIX) 40 MG tablet Take 1 tablet (40 mg total) by mouth daily. 30 tablet 0  . ipratropium (ATROVENT) 0.06 % nasal spray PLACE 2 SPRAYS INTO BOTH NOSTRILS 4 (FOUR) TIMES DAILY.  12  . levothyroxine (SYNTHROID, LEVOTHROID) 75 MCG tablet Take 75 mcg by mouth daily before breakfast.  5  . nitroGLYCERIN (NITROSTAT) 0.4 MG SL tablet Place 1 tablet (0.4 mg total) under the tongue every 5 (five) minutes x 3 doses as needed for chest pain. 25 tablet 3  . polyethylene glycol powder (GLYCOLAX/MIRALAX) powder Take 17 g by mouth daily as needed for moderate constipation.     . ramipril (ALTACE) 10 MG capsule Take two capsules (20 mg) by mouth once daily 180 capsule 3  . Naproxen Sodium 220 MG CAPS Take 1 capsule by mouth daily as needed (pain).     No current facility-administered medications for this visit.   REVIEW OF SYSTEMS: Arly.Keller ] denotes positive finding; [  ] denotes  negative finding  CARDIOVASCULAR:   chest pain    chest pressure    palpitations    orthopnea   Arly.Keller ] dyspnea on exertion   Arly.Keller ] claudication    rest pain    DVT    phlebitis PULMONARY:    productive cough    asthma    wheezing NEUROLOGIC:    weakness   paresthesias   aphasia   amaurosis   dizziness HEMATOLOGIC:    bleeding problems    clotting disorders MUSCULOSKELETAL:  Arly.Keller ] joint pain    joint swelling  leg swelling GASTROINTESTINAL:   blood in stool    hematemesis GENITOURINARY:    dysuria    hematuria PSYCHIATRIC:   history of major depression INTEGUMENTARY:   rashes   ulcers CONSTITUTIONAL:   fever    chills  PHYSICAL EXAM: Filed Vitals:   10/31/14 1424 10/31/14 1429  BP: 170/76 163/80  Pulse: 59   Temp: 98.1 F (36.7 C)   TempSrc: Oral   Height: 5' 10.5" (1.791 m)   Weight: 177 lb 14.4 oz (80.695 kg)   SpO2: 97%    GENERAL: The patient is a somewhat frail elderly male, in no acute distress. The vital signs are documented above. CARDIAC: There is a regular rate and rhythm.  VASCULAR: I do not detect carotid bruits. He has palpable femoral pulses. Both feet are warm and well-perfused. PULMONARY: There is good air exchange bilaterally without wheezing or rales. ABDOMEN: Soft and non-tender with normal pitched bowel sounds. His aneurysm is palpable and nontender. MUSCULOSKELETAL: There are no major deformities or cyanosis. NEUROLOGIC: No focal weakness or paresthesias are detected. SKIN: There are no ulcers or rashes noted. PSYCHIATRIC: The patient has a normal affect.  DATA:  Duplex on 09/26/2014 shows that the maximum diameter of the aneurysm is 5.53 cm. This is increased from 5.1 cm on 03/29/2014.  I reviewed his CT scan which was done on 10/24/2014. This shows that the maximum diameter of the aneurysm is 5.7 cm. The aneurysm extends up to the renal arteries and there is some laminated  thrombus just below the renal arteries. The common iliac arteries are 15 mm in diameter.  MEDICAL ISSUES:  JUXTARENAL ABDOMINAL AORTIC ANEURYSM: Based on the CT findings, I  do not think that he is a candidate for a standard endovascular aneurysm repair. I think that the aneurysm could be addressed with an open repair however he would clearly be at increased risk because of his age and cardiac history. We have discussed the option of obtaining a second opinion from Dr. Pattricia Boss in [facility], in order to determine if he might be a candidate for a fenestrated graft. He is agreeable to this and we will make those arrangements. If he is not a candidate, then the options would be continued follow up versus open repair after preoperative cardiac evaluation.   Waverly Ferrari [facility] [place]: [phone]

## 2014-11-02 NOTE — Addendum Note (Signed)
Addended by: Adria Dill L on: 11/02/2014 03:17 PM   Modules accepted: Orders

## 2014-11-26 ENCOUNTER — Encounter: Payer: Self-pay | Admitting: Internal Medicine

## 2014-11-26 ENCOUNTER — Ambulatory Visit (INDEPENDENT_AMBULATORY_CARE_PROVIDER_SITE_OTHER): Payer: Medicare Other | Admitting: *Deleted

## 2014-11-26 DIAGNOSIS — I429 Cardiomyopathy, unspecified: Secondary | ICD-10-CM | POA: Diagnosis not present

## 2014-11-26 DIAGNOSIS — I428 Other cardiomyopathies: Secondary | ICD-10-CM

## 2014-11-27 NOTE — Progress Notes (Signed)
Remote pacemaker transmission.   

## 2014-11-30 LAB — CUP PACEART REMOTE DEVICE CHECK
Battery Remaining Longevity: 91 mo
Brady Statistic AP VS Percent: 1 %
Brady Statistic AS VP Percent: 18 %
Brady Statistic RA Percent Paced: 81 %
Brady Statistic RV Percent Paced: 99 %
Date Time Interrogation Session: 20161010073335
Implantable Lead Location: 753860
Implantable Lead Model: 1944
Lead Channel Impedance Value: 580 Ohm
Lead Channel Pacing Threshold Amplitude: 0.625 V
Lead Channel Pacing Threshold Pulse Width: 0.4 ms
Lead Channel Sensing Intrinsic Amplitude: 12 mV
Lead Channel Setting Pacing Amplitude: 1.375
Lead Channel Setting Sensing Sensitivity: 2 mV
MDC IDC LEAD IMPLANT DT: 20131004
MDC IDC LEAD IMPLANT DT: 20131004
MDC IDC LEAD LOCATION: 753859
MDC IDC LEAD MODEL: 1948
MDC IDC MSMT BATTERY REMAINING PERCENTAGE: 73 %
MDC IDC MSMT BATTERY VOLTAGE: 2.92 V
MDC IDC MSMT LEADCHNL RA IMPEDANCE VALUE: 440 Ohm
MDC IDC MSMT LEADCHNL RA PACING THRESHOLD AMPLITUDE: 0.375 V
MDC IDC MSMT LEADCHNL RA SENSING INTR AMPL: 5 mV
MDC IDC MSMT LEADCHNL RV PACING THRESHOLD PULSEWIDTH: 0.4 ms
MDC IDC SET LEADCHNL RV PACING AMPLITUDE: 0.875
MDC IDC SET LEADCHNL RV PACING PULSEWIDTH: 0.4 ms
MDC IDC STAT BRADY AP VP PERCENT: 82 %
MDC IDC STAT BRADY AS VS PERCENT: 1 %
Pulse Gen Model: 2210
Pulse Gen Serial Number: 7394618

## 2014-12-28 ENCOUNTER — Encounter: Payer: Self-pay | Admitting: Cardiology

## 2015-02-13 ENCOUNTER — Other Ambulatory Visit: Payer: Self-pay | Admitting: Internal Medicine

## 2015-02-25 ENCOUNTER — Telehealth: Payer: Self-pay | Admitting: Nurse Practitioner

## 2015-02-25 ENCOUNTER — Ambulatory Visit (INDEPENDENT_AMBULATORY_CARE_PROVIDER_SITE_OTHER): Payer: Medicare Other | Admitting: *Deleted

## 2015-02-25 DIAGNOSIS — R001 Bradycardia, unspecified: Secondary | ICD-10-CM | POA: Diagnosis not present

## 2015-02-25 NOTE — Progress Notes (Signed)
Remote pacemaker transmission.   

## 2015-02-25 NOTE — Telephone Encounter (Signed)
Phone call from wife this morning - having issue with transmitter box for PPM - says "all lights are flashing and it is beeping".  Suggested unplugging and plugging back up and try again.  Will have EP staff call when office opens later today.   Rosalio Macadamia, RN, ANP-C [facility] [address] [place], [place]  [address] [phone]

## 2015-02-26 ENCOUNTER — Encounter: Payer: Self-pay | Admitting: Cardiology

## 2015-02-27 ENCOUNTER — Telehealth: Payer: Self-pay | Admitting: *Deleted

## 2015-02-27 NOTE — Telephone Encounter (Signed)
Informed wife that remote was received. Wife voiced understanding. 

## 2015-03-17 LAB — CUP PACEART REMOTE DEVICE CHECK
Battery Remaining Percentage: 73 %
Battery Voltage: 2.92 V
Brady Statistic RA Percent Paced: 79 %
Brady Statistic RV Percent Paced: 99 %
Implantable Lead Implant Date: 20131004
Implantable Lead Location: 753859
Implantable Lead Location: 753860
Lead Channel Impedance Value: 480 Ohm
Lead Channel Impedance Value: 580 Ohm
Lead Channel Pacing Threshold Amplitude: 0.375 V
Lead Channel Pacing Threshold Amplitude: 0.75 V
Lead Channel Pacing Threshold Pulse Width: 0.4 ms
Lead Channel Pacing Threshold Pulse Width: 0.4 ms
Lead Channel Sensing Intrinsic Amplitude: 4.4 mV
Lead Channel Setting Pacing Amplitude: 0.875
Lead Channel Setting Pacing Amplitude: 1.375
Lead Channel Setting Pacing Pulse Width: 0.4 ms
MDC IDC LEAD IMPLANT DT: 20131004
MDC IDC LEAD MODEL: 1944
MDC IDC LEAD MODEL: 1948
MDC IDC MSMT BATTERY REMAINING LONGEVITY: 84 mo
MDC IDC MSMT LEADCHNL RV SENSING INTR AMPL: 12 mV
MDC IDC SESS DTM: 20170129132547
MDC IDC SET LEADCHNL RV SENSING SENSITIVITY: 2 mV
Pulse Gen Serial Number: 7394618

## 2015-03-20 ENCOUNTER — Encounter: Payer: Self-pay | Admitting: Cardiology

## 2015-03-27 ENCOUNTER — Other Ambulatory Visit: Payer: Medicare Other

## 2015-04-03 ENCOUNTER — Ambulatory Visit: Payer: Medicare Other | Admitting: Vascular Surgery

## 2015-05-18 ENCOUNTER — Other Ambulatory Visit: Payer: Self-pay | Admitting: Internal Medicine

## 2015-06-18 ENCOUNTER — Encounter: Payer: Self-pay | Admitting: Nurse Practitioner

## 2015-06-18 NOTE — Progress Notes (Signed)
Electrophysiology Office Note Date: 06/19/2015  ID:  DONTRAIL Connor Diaz, DOB 1923-04-17, MRN [id number]  PCP: [name], MD Primary Cardiologist: [name] Electrophysiologist: Allred  CC: Pacemaker follow-up  Connor Diaz is a 82 y.o. male seen today for Dr Connor Diaz.  He presents today for routine electrophysiology followup.  Since last being seen in our clinic, the patient reports doing very well.  He denies chest pain, palpitations, dyspnea, PND, orthopnea, nausea, vomiting, dizziness, syncope, edema, weight gain, or early satiety.  Echo 08/2013 demonstrated EF 20-25%, grade 1 diastolic dysfunction, LA moderately dilated  Device History: STJ dual chamber PPM implanted 2013 for symptomatic bradycardia   Past Medical History  Diagnosis Date  . Hypertension   . AAA (abdominal aortic aneurysm) (HCC)     a. 02/2009 U/S 4.4 x 4.5 cm  . PAF (paroxysmal atrial fibrillation) (HCC)     a. noted 11/2011  . Symptomatic bradycardia     a. s/p STJ dual chamber PPM   . History of tobacco abuse     a. roughly 36 pack years, quit @ age 45.  Marland Kitchen Pancreatitis     a. 10/2011 - managed conservatively @ home.  Marland Kitchen BPH (benign prostatic hyperplasia)   . Coronary artery disease   . Hypothyroidism   . DJD (degenerative joint disease)   . CKD (chronic kidney disease), stage III   . Systolic heart failure (HCC)     02/2013  . Non-ischemic cardiomyopathy [facility])    Past Surgical History  Procedure Laterality Date  . Coronary angioplasty with stent placement  12/22/2011     LAD & CIRCUMFLEX  . Tonsillectomy  ~ 1933  . Cataract extraction w/ intraocular lens  implant, bilateral Bilateral 1980's  . Eye surgery    . Coronary angiogram  11/19/2011    Procedure: CORONARY ANGIOGRAM;  Surgeon: Connor Pane, MD;  Location: [facility] CATH LAB;  Service: Cardiovascular;;  . Permanent pacemaker insertion N/A 11/20/2011    STJ dual chamber PPM implanted by Dr Connor Diaz for symptomatic bradycardia   .  Percutaneous coronary stent intervention (pci-s) N/A 12/22/2011    Procedure: PERCUTANEOUS CORONARY STENT INTERVENTION (PCI-S);  Surgeon: Connor Pane, MD;  Location: [facility] CATH LAB;  Service: Cardiovascular;  Laterality: N/A;    Current Outpatient Prescriptions  Medication Sig Dispense Refill  . acetaminophen (TYLENOL) 500 MG tablet Take 500 mg by mouth daily.    Marland Kitchen atorvastatin (LIPITOR) 80 MG tablet Take 40 mg by mouth daily at 6 PM.    . carvedilol (COREG) 3.125 MG tablet TAKE 1 TABLET (3.125 MG TOTAL) BY MOUTH 2 (TWO) TIMES DAILY WITH A MEAL. 60 tablet 4  . cetirizine (ZYRTEC ALLERGY) 10 MG tablet Take 10 mg by mouth daily.    . clopidogrel (PLAVIX) 75 MG tablet Take 75 mg by mouth daily with breakfast.    . famotidine (PEPCID) 20 MG tablet Take 20 mg by mouth daily.    . finasteride (PROSCAR) 5 MG tablet Take 5 mg by mouth daily.     . fish oil-omega-3 fatty acids 1000 MG capsule Take 1 g by mouth every morning.     . furosemide (LASIX) 40 MG tablet Take 1 tablet (40 mg total) by mouth daily. 30 tablet 0  . levothyroxine (SYNTHROID, LEVOTHROID) 75 MCG tablet Take 75 mcg by mouth daily before breakfast.  5  . nitroGLYCERIN (NITROSTAT) 0.4 MG SL tablet Place 1 tablet (0.4 mg total) under the tongue every 5 (five) minutes x 3 doses as  needed for chest pain. 25 tablet 3  . polyethylene glycol powder (GLYCOLAX/MIRALAX) powder Take 17 g by mouth daily as needed for moderate constipation.     . ramipril (ALTACE) 10 MG capsule TAKE TWO CAPSULES (20 MG) BY MOUTH ONCE DAILY 180 capsule 0  . metoprolol (LOPRESSOR) 50 MG tablet Take 50 mg by mouth 2 (two) times daily.     No current facility-administered medications for this visit.    Allergies:   Iodinated diagnostic agents and Isovue   Social History: Social History   Social History  . Marital Status: Married    Spouse Name: N/A  . Number of Children: N/A  . Years of Education: N/A   Occupational History  . Not on file.   Social  History Main Topics  . Smoking status: Former Smoker -- 1.00 packs/day for 35 years    Types: Cigarettes    Quit date: 12/17/1980  . Smokeless tobacco: Never Used     Comment: Quit at age 82.  Marland Kitchen Alcohol Use: No  . Drug Use: No  . Sexual Activity: Not Currently   Other Topics Concern  . Not on file   Social History Narrative   Pt lives in [place] with his wife.  He is retired from [employer].  He is relatively active @ home.    Family History: Family History  Problem Relation Age of Onset  . Other Father     Accidental death @ age 47 - box fell on him at work  . Cancer Mother     died @ 72 Bladder cancer     Review of Systems: All other systems reviewed and are otherwise negative except as noted above.   Physical Exam: VS:  BP 118/68 mmHg  Pulse 62  Ht 5' 10.5" (1.791 m)  Wt 177 lb 12.8 oz (80.65 kg)  BMI 25.14 kg/m2 , BMI Body mass index is 25.14 kg/(m^2).  GEN- The patient is well appearing, alert and oriented x 3 today.   HEENT: normocephalic, atraumatic; sclera clear, conjunctiva pink; hearing intact; oropharynx clear; neck supple  Lungs- Clear to ausculation bilaterally, normal work of breathing.  No wheezes, rales, rhonchi Heart- Regular rate and rhythm (paced) GI- soft, non-tender, non-distended, bowel sounds present Extremities- no clubbing, cyanosis, or edema; DP/PT/radial pulses 1+ bilaterally MS- no significant deformity or atrophy Skin- warm and dry, no rash or lesion; PPM pocket well healed Psych- euthymic mood, full affect Neuro- strength and sensation are intact  PPM Interrogation- reviewed in detail today,  See PACEART report  EKG:  EKG is ordered today. The ekg ordered today shows AV pacing  Recent Labs: 10/17/2014: BUN 35*; Creat 1.38*   Wt Readings from Last 3 Encounters:  06/19/15 177 lb 12.8 oz (80.65 kg)  10/31/14 177 lb 14.4 oz (80.695 kg)  09/26/14 174 lb 6.4 oz (79.107 kg)     Other studies Reviewed: Additional  studies/ records that were reviewed today include: Dr [name] office notes  Assessment and Plan:  1.  Symptomatic bradycardia Normal PPM function See Pace Art report No changes today  2.  NICM/LV dysfunction Euvolemic on exam Remains active without significant HF symptoms With advanced age, he is a poor candidate for EP procedures Manage conservatively  3.  Paroxysmal atrial fibrillation 1 episode lasting 3 hours documented on device interrogation today Will continue to follow If burden increases, will need anticoagulation for CHADS2VASC of 4 - discussed with patient today   4.  HTN Pt on both Metoprolol  and Coreg Will discontinue Metoprolol and increase Coreg to 6.25mg  bid. Follow up with Dr [name] next week as scheduled    Current medicines are reviewed at length with the patient today.   The patient does not have concerns regarding his medicines.  The following changes were made today:  Discontinue metoprolol. Increase Coreg to 6.25mg  bid   Labs/ tests ordered today include: none   Disposition:   Follow up with Dr [name] as scheduled, Arsenio Katz, Dr Connor Diaz 1 year    Signed, Gypsy Balsam, NP 06/19/2015 10:30 AM  [facility] [address] [place] [address] [phone] (office) [phone] (fax)

## 2015-06-19 ENCOUNTER — Ambulatory Visit (INDEPENDENT_AMBULATORY_CARE_PROVIDER_SITE_OTHER): Payer: Medicare Other | Admitting: Nurse Practitioner

## 2015-06-19 ENCOUNTER — Encounter: Payer: Self-pay | Admitting: Nurse Practitioner

## 2015-06-19 ENCOUNTER — Encounter: Payer: Self-pay | Admitting: Internal Medicine

## 2015-06-19 VITALS — BP 118/68 | HR 62 | Ht 70.5 in | Wt 177.8 lb

## 2015-06-19 DIAGNOSIS — I429 Cardiomyopathy, unspecified: Secondary | ICD-10-CM

## 2015-06-19 DIAGNOSIS — I428 Other cardiomyopathies: Secondary | ICD-10-CM

## 2015-06-19 DIAGNOSIS — I480 Paroxysmal atrial fibrillation: Secondary | ICD-10-CM | POA: Diagnosis not present

## 2015-06-19 DIAGNOSIS — I10 Essential (primary) hypertension: Secondary | ICD-10-CM

## 2015-06-19 DIAGNOSIS — R001 Bradycardia, unspecified: Secondary | ICD-10-CM | POA: Diagnosis not present

## 2015-06-19 LAB — CUP PACEART INCLINIC DEVICE CHECK
Implantable Lead Implant Date: 20131004
Implantable Lead Implant Date: 20131004
Implantable Lead Location: 753859
Implantable Lead Model: 1948
MDC IDC LEAD LOCATION: 753860
MDC IDC LEAD MODEL: 1944
MDC IDC SESS DTM: 20170503103310
Pulse Gen Model: 2210
Pulse Gen Serial Number: 7394618

## 2015-06-19 MED ORDER — CARVEDILOL 6.25 MG PO TABS: 6.2500 mg | ORAL_TABLET | Freq: Two times a day (BID) | ORAL | Status: DC

## 2015-06-19 NOTE — Patient Instructions (Addendum)
Medication Instructions:   STOP TAKING METOPROLOL  START TAKING  CARVEDILOL 6.25 MG  TWICE A DAY   If you need a refill on your cardiac medications before your next appointment, please call your pharmacy.  Labwork:  NONE ORDER TODAY    Testing/Procedures:  NONE ORDER TODAY    Follow-Up:  Your physician wants you to follow-up in: ONE YEAR WITH ALLRED  You will receive a reminder letter in the mail two months in advance. If you don't receive a letter, please call our office to schedule the follow-up appointment.   Remote monitoring is used to monitor your Pacemaker of ICD from home. This monitoring reduces the number of office visits required to check your device to one time per year. It allows [place] to keep an eye on the functioning of your device to ensure it is working properly. You are scheduled for a device check from home on . 09/20/15..You may send your transmission at any time that day. If you have a wireless device, the transmission will be sent automatically. After your physician reviews your transmission, you will receive a postcard with your next transmission date. ]  Any Other Special Instructions Will Be Listed Below (If Applicable).

## 2015-08-24 ENCOUNTER — Other Ambulatory Visit: Payer: Self-pay | Admitting: Internal Medicine

## 2015-09-19 ENCOUNTER — Encounter: Payer: Self-pay | Admitting: Internal Medicine

## 2015-09-20 ENCOUNTER — Ambulatory Visit (INDEPENDENT_AMBULATORY_CARE_PROVIDER_SITE_OTHER): Payer: Medicare Other | Admitting: *Deleted

## 2015-09-20 DIAGNOSIS — I480 Paroxysmal atrial fibrillation: Secondary | ICD-10-CM

## 2015-09-20 DIAGNOSIS — R001 Bradycardia, unspecified: Secondary | ICD-10-CM

## 2015-09-20 NOTE — Progress Notes (Signed)
Remote pacemaker transmission.   

## 2015-09-25 ENCOUNTER — Encounter: Payer: Self-pay | Admitting: Cardiology

## 2015-09-27 ENCOUNTER — Encounter: Payer: Self-pay | Admitting: Internal Medicine

## 2015-09-27 DIAGNOSIS — I480 Paroxysmal atrial fibrillation: Secondary | ICD-10-CM | POA: Insufficient documentation

## 2015-09-30 ENCOUNTER — Encounter (HOSPITAL_COMMUNITY): Payer: Medicare Other

## 2015-10-01 ENCOUNTER — Other Ambulatory Visit (HOSPITAL_COMMUNITY): Payer: Medicare Other

## 2015-10-10 ENCOUNTER — Telehealth: Payer: Self-pay | Admitting: *Deleted

## 2015-10-10 DIAGNOSIS — I480 Paroxysmal atrial fibrillation: Secondary | ICD-10-CM

## 2015-10-10 NOTE — Telephone Encounter (Signed)
[facility] requesting call back.  Gave device clinic phone number.  Per Dr. Johney Frame, patient needs appointment with Dr. Johney Frame or APP to discuss [facility].

## 2015-10-14 LAB — CUP PACEART REMOTE DEVICE CHECK
Brady Statistic AP VS Percent: 1 %
Brady Statistic AS VP Percent: 12 %
Brady Statistic RA Percent Paced: 85 %
Brady Statistic RV Percent Paced: 99 %
Date Time Interrogation Session: 20170804071751
Implantable Lead Implant Date: 20131004
Implantable Lead Location: 753860
Implantable Lead Model: 1944
Implantable Lead Model: 1948
Lead Channel Pacing Threshold Amplitude: 1 V
Lead Channel Pacing Threshold Pulse Width: 0.4 ms
Lead Channel Sensing Intrinsic Amplitude: 12 mV
Lead Channel Sensing Intrinsic Amplitude: 4 mV
Lead Channel Setting Pacing Amplitude: 1.25 V
Lead Channel Setting Pacing Amplitude: 1.375
Lead Channel Setting Pacing Pulse Width: 0.4 ms
MDC IDC LEAD IMPLANT DT: 20131004
MDC IDC LEAD LOCATION: 753859
MDC IDC MSMT BATTERY REMAINING LONGEVITY: 91 mo
MDC IDC MSMT BATTERY REMAINING PERCENTAGE: 73 %
MDC IDC MSMT BATTERY VOLTAGE: 2.92 V
MDC IDC MSMT LEADCHNL RA IMPEDANCE VALUE: 440 Ohm
MDC IDC MSMT LEADCHNL RA PACING THRESHOLD AMPLITUDE: 0.375 V
MDC IDC MSMT LEADCHNL RV IMPEDANCE VALUE: 530 Ohm
MDC IDC MSMT LEADCHNL RV PACING THRESHOLD PULSEWIDTH: 0.4 ms
MDC IDC SET LEADCHNL RV SENSING SENSITIVITY: 2 mV
MDC IDC STAT BRADY AP VP PERCENT: 88 %
MDC IDC STAT BRADY AS VS PERCENT: 1 %
Pulse Gen Model: 2210
Pulse Gen Serial Number: 7394618

## 2015-10-14 NOTE — Telephone Encounter (Signed)
Patient agreeable to appointment on 10/25/15 at 10:00am with Francis Dowse, PA to discuss new PAF per Dr. [name] recommendation.  He is appreciative of call and denies additional questions or concerns at this time.

## 2015-10-14 NOTE — Telephone Encounter (Signed)
LM with patient's wife requesting that patient call back to schedule appointment.  Gave device clinic phone number.

## 2015-10-16 ENCOUNTER — Telehealth: Payer: Self-pay | Admitting: Internal Medicine

## 2015-10-16 ENCOUNTER — Ambulatory Visit: Payer: Medicare Other | Admitting: Physician Assistant

## 2015-10-16 NOTE — Telephone Encounter (Signed)
Called pt back, lm for her to call back.   We have cancelled pts appt per conversation with Carlean Jews, PA-C and pt via phone 10/15/15.

## 2015-10-16 NOTE — Telephone Encounter (Signed)
New Message  Pts wife voiced she will go see the MD who they and Bary Castilla spoke about last night this morning and will come see her today at 2.  Pt voiced to leave message and not to follow up with them. Thanks!

## 2015-10-16 NOTE — Telephone Encounter (Signed)
Follow up  Pts wife voiced she is returning nurses call.  Please follow up with pt. Thanks!

## 2015-10-16 NOTE — Telephone Encounter (Signed)
Returned wife call to let her know that the appt with Carlean Jews, PA-C due to pt has regular cardiologist and pt has already seen him today.

## 2015-10-22 ENCOUNTER — Encounter: Payer: Self-pay | Admitting: Physician Assistant

## 2015-10-22 NOTE — Progress Notes (Signed)
Electrophysiology Office Note Date: 10/23/2015  ID:  NEWMAN WAREN, DOB 12/26/1923, MRN [id number]  PCP: [name], MD Primary Cardiologist: [name] Electrophysiologist: Allred  CC: Pacemaker follow-up, discuss AF  Connor Diaz is a 82 y.o. male seen today for [name].  PMHx includes CAD, ICM, HTN, CRI, DJD, hypothyroidsm, PAFib.   He was last seen by EP service with Gypsy Balsam, NP,  at that time doing well, noting a 3 hour epiosde of AFib, to monitor via his device for increased burden, no a/c was started, his metoprolol changed to carvedilol.    Since last being seen in our clinic, the patient reports doing very well until about 3 weeks ago began feeling winded wth exertion, this getting progressively worse, no rest SOB, he saw Dr. [name] last week who told him he was in AF and started him on Eliquis 2.5mg  daily he states he had labs, EKG and an echo done via his office.  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 And has had progressive DOE, he has not taken any lasix, not noting any swelling or weight gain at home and doesn't like how it keeps him in the BR.  He denies chest pain, palpitations, denies symptoms of PND, orthopnea, no nausea, vomiting, dizziness, syncope, he does not feel bloated.  10/17/15: Echo with EF 20-24%, , mild MR/AI, trace PI, mild TR Echo 08/2013 demonstrated EF 20-25%, grade 1 diastolic dysfunction, LA moderately dilated  Device History: STJ dual chamber PPM implanted 2013 for symptomatic bradycardia   Past Medical History:  Diagnosis Date  . AAA (abdominal aortic aneurysm) (HCC)    a. 02/2009 U/S 4.4 x 4.5 cm  . BPH (benign prostatic hyperplasia)   . CKD (chronic kidney disease), stage III   . Coronary artery disease   . DJD (degenerative joint disease)   . History of tobacco abuse    a.  roughly 36 pack years, quit @ age 61.  Marland Kitchen Hypertension   . Hypothyroidism   . Non-ischemic cardiomyopathy (HCC)   . PAF (paroxysmal atrial fibrillation) (HCC)    a. noted 11/2011  . Pancreatitis    a. 10/2011 - managed conservatively @ home.  . Symptomatic bradycardia    a. s/p STJ dual chamber PPM   . Systolic heart failure (HCC)    02/2013   Past Surgical History:  Procedure Laterality Date  . CATARACT EXTRACTION W/ INTRAOCULAR LENS  IMPLANT, BILATERAL Bilateral 1980's  . CORONARY ANGIOGRAM  11/19/2011   Procedure: CORONARY ANGIOGRAM;  Surgeon: [name], MD;  Location: [facility] CATH LAB;  Service: Cardiovascular;;  . CORONARY ANGIOPLASTY WITH STENT PLACEMENT  12/22/2011    LAD & CIRCUMFLEX  . EYE SURGERY    . PERCUTANEOUS CORONARY STENT INTERVENTION (PCI-S) N/A 12/22/2011   Procedure: PERCUTANEOUS CORONARY STENT INTERVENTION (PCI-S);  Surgeon: [name], MD;  Location: [facility] CATH LAB;  Service: Cardiovascular;  Laterality: N/A;  . PERMANENT PACEMAKER INSERTION N/A 11/20/2011   STJ dual chamber PPM implanted by [name] for symptomatic bradycardia   . TONSILLECTOMY  ~ 1933    Current Outpatient Prescriptions  Medication Sig Dispense Refill  . acetaminophen (TYLENOL) 500 MG tablet Take 500 mg by mouth daily.    Marland Kitchen Apixaban (ELIQUIS PO) Take 2.5 mg by mouth 2 (two) times daily.     Marland Kitchen atorvastatin (LIPITOR)  80 MG tablet Take 40 mg by mouth daily at 6 PM.    . carvedilol (COREG) 6.25 MG tablet Take 1 tablet (6.25 mg total) by mouth 2 (two) times daily with a meal. 60 tablet 6  . cetirizine (ZYRTEC ALLERGY) 10 MG tablet Take 10 mg by mouth daily.    . famotidine (PEPCID) 20 MG tablet Take 20 mg by mouth daily.    . finasteride (PROSCAR) 5 MG tablet Take 5 mg by mouth daily.     . fish oil-omega-3 fatty acids 1000 MG capsule Take 1 g by mouth every morning.     . furosemide (LASIX) 40 MG tablet Take 1 tablet (40 mg total) by mouth daily. 30 tablet 0  . levothyroxine (SYNTHROID,  LEVOTHROID) 75 MCG tablet Take 75 mcg by mouth daily before breakfast.  5  . nitroGLYCERIN (NITROSTAT) 0.4 MG SL tablet Place 1 tablet (0.4 mg total) under the tongue every 5 (five) minutes x 3 doses as needed for chest pain. 25 tablet 3  . polyethylene glycol powder (GLYCOLAX/MIRALAX) powder Take 17 g by mouth daily as needed for moderate constipation.     . ramipril (ALTACE) 10 MG capsule TAKE TWO CAPSULES (20 MG) BY MOUTH ONCE DAILY 180 capsule 3   No current facility-administered medications for this visit.     Allergies:   Iodinated diagnostic agents and Isovue [iopamidol]   Social History: Social History   Social History  . Marital status: Married    Spouse name: N/A  . Number of children: N/A  . Years of education: N/A   Occupational History  . Not on file.   Social History Main Topics  . Smoking status: Former Smoker    Packs/day: 1.00    Years: 35.00    Types: Cigarettes    Quit date: 12/17/1980  . Smokeless tobacco: Never Used     Comment: Quit at age 82.  Marland Kitchen. Alcohol use No  . Drug use: No  . Sexual activity: Not Currently   Other Topics Concern  . Not on file   Social History Narrative   Pt lives in [place] with his wife.  He is retired from [employer].  He is relatively active @ home.    Family History: Family History  Problem Relation Age of Onset  . Other Father     Accidental death @ age 82 - box fell on him at work  . Cancer Mother     died @ 2380 Bladder cancer     Review of Systems: All other systems reviewed and are otherwise negative except as noted above.   Physical Exam: VS:  BP 100/72   Pulse 76   Ht 5' 10.5" (1.791 m)   Wt 150 lb (68 kg)   BMI 21.22 kg/m  , BMI Body mass index is 21.22 kg/m.  GEN- The patient is well appearing, alert and oriented x 3 today.   HEENT: normocephalic, atraumatic; sclera clear, conjunctiva pink; hearing intact; oropharynx clear; neck supple  Lungs- Clear to ausculation bilaterally,  normal work of breathing.  No wheezes, rales, rhonchi Heart- IRRR, 1/6 SM, no gallops, no rubs GI- soft, non-tender, non-distended Extremities- no clubbing, cyanosis, 1+ ankle edema MS- no significant deformity or atrophy Skin- warm and dry, no rash or lesion Psych- euthymic mood, full affect Neuro- strength and sensation are intact  PPM site is stable, no tethering, discomfort  PPM Interrogation- AFlutter, onset 35 days ago and ongoing, V outputs had changed to 5V, device  unable to perform auto threshold testing secondary to Af, reprogrammed to fixed out put, leads and battery status are OK.  V rates in AF are generally well controlled 70-90bpm  EKG:  Done today and reviewed by myself/Dr. Allred, AFlutter, 76bpm, RBBB  Recent Labs: No results found for requested labs within last 8760 hours.   Wt Readings from Last 3 Encounters:  10/23/15 150 lb (68 kg)  06/19/15 177 lb 12.8 oz (80.6 kg)  10/31/14 177 lb 14.4 oz (80.7 kg)     Other studies Reviewed: Additional studies/ records that were reviewed today include:previous office notes  Assessment and Plan:  1.  Symptomatic bradycardia Normal PPM function Programmed to fixed V output   2.  NICM/LV dysfunction Weight is unchanged, he has some ankle edema that he had not taken notice of Increasing SOB/DOE correlates with the persistent AF,  With advanced age, he has been felt a poor candidate for EP procedures, device upgrades,  and has been recommended to manage conservatively  3.  Atrial Flutter He has been in AF for 35 days, remains in AFlutter this correlates well with his SOB CHA2DS2Vasc is at least 5, started on Eliquis last week by his primary cardiologist  4.  HTN Pt on both Metoprolol and Coreg Will discontinue Metoprolol and increase Coreg to 6.25mg  bid. Follow up with Dr [name] next week as scheduled   5. CAD     No CP     Follows with Dr. [name]   He has increased exertional intolerance that correlates with  the AF, his exam is not c/w with overt fluid OL and the patient is quite resistant to use lasix, discussed with him daily weights, monitoring for any increased SOB or development of any rest symptoms, or increased swelling (suspect some chronic ankle edema given he did not feel like today's finding was new or different), his weight is unchanged from his last visit her 4 months ago.  Discussed importance of his Eliquis and not missing any doses, bleeding precautions, signs of bleeding. Discussed with Dr. Johney Frame, would consider DCCV after 3-4weeks of uninterrupted a/c, or TEE/DCCV sooner as a 1st line POC, and this will be deferred to Dr. [name] for further discussion and planning.  He has f/u already scheduled they think in 6 weeks.   Disposition:   Follow up with Dr [name], instructed to give a call to see about possibly DCCV in 3-4 weeks of uninterrupted anticoaglation, or TEE/DCCV sooner, we will schedule a 3 month remote pacer check, and 70mo visit with Dr. Johney Frame, sooner if needed, pending his f/u with Dr. [name].  Norma Fredrickson, PA-C 10/23/2015 10:03 AM  [facility] [address] [place] [address] [phone] (office) [phone] (fax)

## 2015-10-23 ENCOUNTER — Encounter: Payer: Self-pay | Admitting: Physician Assistant

## 2015-10-23 ENCOUNTER — Ambulatory Visit (INDEPENDENT_AMBULATORY_CARE_PROVIDER_SITE_OTHER): Payer: Medicare Other | Admitting: Physician Assistant

## 2015-10-23 VITALS — BP 100/72 | HR 76 | Ht 70.5 in | Wt 150.0 lb

## 2015-10-23 DIAGNOSIS — I480 Paroxysmal atrial fibrillation: Secondary | ICD-10-CM

## 2015-10-23 DIAGNOSIS — R0602 Shortness of breath: Secondary | ICD-10-CM

## 2015-10-23 DIAGNOSIS — I10 Essential (primary) hypertension: Secondary | ICD-10-CM | POA: Diagnosis not present

## 2015-10-23 DIAGNOSIS — R001 Bradycardia, unspecified: Secondary | ICD-10-CM

## 2015-10-23 DIAGNOSIS — I5041 Acute combined systolic (congestive) and diastolic (congestive) heart failure: Secondary | ICD-10-CM

## 2015-10-23 DIAGNOSIS — I2510 Atherosclerotic heart disease of native coronary artery without angina pectoris: Secondary | ICD-10-CM

## 2015-10-23 NOTE — Patient Instructions (Addendum)
Medication Instructions:   Your physician recommends that you continue on your current medications as directed. Please refer to the Current Medication list given to you today.   If you need a refill on your cardiac medications before your next appointment, please call your pharmacy.  Labwork: NONE ORDER TODAY    Testing/Procedures: NONE ORDER TODAY    Follow-Up: Your physician wants you to follow-up in:  IN  6  MONTHS WITH [name]   You will receive a reminder letter in the mail two months in advance. If you don't receive a letter, please call our office to schedule the follow-up appointment.  Remote monitoring is used to monitor your Pacemaker of ICD from home. This monitoring reduces the number of office visits required to check your device to one time per year. It allows [place] to keep an eye on the functioning of your device to ensure it is working properly. You are scheduled for a device check from home on .01/22/2016..You may send your transmission at any time that day. If you have a wireless device, the transmission will be sent automatically. After your physician reviews your transmission, you will receive a postcard with your next transmission date.   Any Other Special Instructions Will Be Listed Below (If Applicable).  YOU HAVE BEEN RECCOMMENDED TO FOLLOW UP WITH DR HARWANI IN THE NEXT WEEK OR SO.  THIS WOULD BE  REGARDING  A POSSIBE CARDIOVERSION  TEE / OR CARDIOVERSION WHICH WILL BE DETERMINED AND COMPLETED WITH DR [facility] AND HIS CARDIOLOGY CLINIC

## 2015-10-25 ENCOUNTER — Encounter: Payer: Medicare Other | Admitting: Physician Assistant

## 2015-10-28 ENCOUNTER — Ambulatory Visit: Payer: Medicare Other | Admitting: Cardiology

## 2016-01-22 ENCOUNTER — Ambulatory Visit (INDEPENDENT_AMBULATORY_CARE_PROVIDER_SITE_OTHER): Payer: Medicare Other | Admitting: *Deleted

## 2016-01-22 DIAGNOSIS — R001 Bradycardia, unspecified: Secondary | ICD-10-CM | POA: Diagnosis not present

## 2016-01-22 NOTE — Progress Notes (Signed)
Remote pacemaker transmission.   

## 2016-01-29 ENCOUNTER — Encounter: Payer: Self-pay | Admitting: Cardiology

## 2016-02-18 LAB — CUP PACEART REMOTE DEVICE CHECK
Battery Voltage: 2.92 V
Brady Statistic AP VP Percent: 88 %
Brady Statistic AS VP Percent: 12 %
Brady Statistic RA Percent Paced: 37 %
Brady Statistic RV Percent Paced: 81 %
Date Time Interrogation Session: 20171206074805
Implantable Lead Implant Date: 20131004
Implantable Lead Implant Date: 20131004
Implantable Lead Location: 753859
Implantable Lead Model: 1944
Implantable Lead Model: 1948
Implantable Pulse Generator Implant Date: 20131004
Lead Channel Impedance Value: 390 Ohm
Lead Channel Impedance Value: 580 Ohm
Lead Channel Pacing Threshold Amplitude: 0.375 V
Lead Channel Sensing Intrinsic Amplitude: 12 mV
Lead Channel Sensing Intrinsic Amplitude: 5 mV
Lead Channel Setting Pacing Amplitude: 1.375
Lead Channel Setting Pacing Amplitude: 2.5 V
Lead Channel Setting Pacing Pulse Width: 0.4 ms
MDC IDC LEAD LOCATION: 753860
MDC IDC MSMT BATTERY REMAINING LONGEVITY: 85 mo
MDC IDC MSMT BATTERY REMAINING PERCENTAGE: 73 %
MDC IDC MSMT LEADCHNL RA PACING THRESHOLD PULSEWIDTH: 0.4 ms
MDC IDC MSMT LEADCHNL RV PACING THRESHOLD AMPLITUDE: 0.75 V
MDC IDC MSMT LEADCHNL RV PACING THRESHOLD PULSEWIDTH: 0.4 ms
MDC IDC SET LEADCHNL RV SENSING SENSITIVITY: 2 mV
MDC IDC STAT BRADY AP VS PERCENT: 1 %
MDC IDC STAT BRADY AS VS PERCENT: 1 %
Pulse Gen Model: 2210
Pulse Gen Serial Number: 7394618

## 2016-03-21 ENCOUNTER — Other Ambulatory Visit: Payer: Self-pay | Admitting: Nurse Practitioner

## 2016-04-14 ENCOUNTER — Inpatient Hospital Stay (HOSPITAL_COMMUNITY)
Admission: EM | Admit: 2016-04-14 | Discharge: 2016-04-23 | DRG: 480 | Disposition: A | Discharge status: Home / Self Care | Payer: Medicare Other | Attending: Internal Medicine | Admitting: Internal Medicine

## 2016-04-14 ENCOUNTER — Encounter (INDEPENDENT_AMBULATORY_CARE_PROVIDER_SITE_OTHER): Payer: Self-pay | Admitting: Internal Medicine

## 2016-04-14 ENCOUNTER — Encounter (HOSPITAL_COMMUNITY): Payer: Self-pay | Admitting: Emergency Medicine

## 2016-04-14 ENCOUNTER — Emergency Department (HOSPITAL_COMMUNITY): Payer: Medicare Other

## 2016-04-14 ENCOUNTER — Ambulatory Visit (INDEPENDENT_AMBULATORY_CARE_PROVIDER_SITE_OTHER): Payer: Medicare Other | Admitting: Internal Medicine

## 2016-04-14 ENCOUNTER — Encounter (INDEPENDENT_AMBULATORY_CARE_PROVIDER_SITE_OTHER): Payer: Self-pay

## 2016-04-14 VITALS — BP 110/64 | HR 60 | Temp 97.4°F | Ht 70.0 in | Wt 174.1 lb

## 2016-04-14 DIAGNOSIS — S72001A Fracture of unspecified part of neck of right femur, initial encounter for closed fracture: Secondary | ICD-10-CM | POA: Diagnosis not present

## 2016-04-14 DIAGNOSIS — K567 Ileus, unspecified: Secondary | ICD-10-CM | POA: Diagnosis not present

## 2016-04-14 DIAGNOSIS — N179 Acute kidney failure, unspecified: Secondary | ICD-10-CM | POA: Diagnosis present

## 2016-04-14 DIAGNOSIS — I428 Other cardiomyopathies: Secondary | ICD-10-CM

## 2016-04-14 DIAGNOSIS — I714 Abdominal aortic aneurysm, without rupture, unspecified: Secondary | ICD-10-CM | POA: Diagnosis present

## 2016-04-14 DIAGNOSIS — S72009A Fracture of unspecified part of neck of unspecified femur, initial encounter for closed fracture: Secondary | ICD-10-CM | POA: Diagnosis present

## 2016-04-14 DIAGNOSIS — Z66 Do not resuscitate: Secondary | ICD-10-CM | POA: Diagnosis present

## 2016-04-14 DIAGNOSIS — Z8679 Personal history of other diseases of the circulatory system: Secondary | ICD-10-CM

## 2016-04-14 DIAGNOSIS — N400 Enlarged prostate without lower urinary tract symptoms: Secondary | ICD-10-CM | POA: Diagnosis present

## 2016-04-14 DIAGNOSIS — I130 Hypertensive heart and chronic kidney disease with heart failure and stage 1 through stage 4 chronic kidney disease, or unspecified chronic kidney disease: Secondary | ICD-10-CM | POA: Diagnosis present

## 2016-04-14 DIAGNOSIS — R140 Abdominal distension (gaseous): Secondary | ICD-10-CM

## 2016-04-14 DIAGNOSIS — E039 Hypothyroidism, unspecified: Secondary | ICD-10-CM | POA: Diagnosis present

## 2016-04-14 DIAGNOSIS — N183 Chronic kidney disease, stage 3 unspecified: Secondary | ICD-10-CM | POA: Diagnosis present

## 2016-04-14 DIAGNOSIS — R0603 Acute respiratory distress: Secondary | ICD-10-CM

## 2016-04-14 DIAGNOSIS — E875 Hyperkalemia: Secondary | ICD-10-CM | POA: Diagnosis not present

## 2016-04-14 DIAGNOSIS — A09 Infectious gastroenteritis and colitis, unspecified: Secondary | ICD-10-CM

## 2016-04-14 DIAGNOSIS — I480 Paroxysmal atrial fibrillation: Secondary | ICD-10-CM | POA: Diagnosis present

## 2016-04-14 DIAGNOSIS — M1990 Unspecified osteoarthritis, unspecified site: Secondary | ICD-10-CM | POA: Diagnosis present

## 2016-04-14 DIAGNOSIS — I5043 Acute on chronic combined systolic (congestive) and diastolic (congestive) heart failure: Secondary | ICD-10-CM | POA: Diagnosis present

## 2016-04-14 DIAGNOSIS — T464X5A Adverse effect of angiotensin-converting-enzyme inhibitors, initial encounter: Secondary | ICD-10-CM | POA: Diagnosis present

## 2016-04-14 DIAGNOSIS — Z950 Presence of cardiac pacemaker: Secondary | ICD-10-CM

## 2016-04-14 DIAGNOSIS — E860 Dehydration: Secondary | ICD-10-CM | POA: Diagnosis present

## 2016-04-14 DIAGNOSIS — E861 Hypovolemia: Secondary | ICD-10-CM | POA: Diagnosis present

## 2016-04-14 DIAGNOSIS — K529 Noninfective gastroenteritis and colitis, unspecified: Secondary | ICD-10-CM | POA: Diagnosis present

## 2016-04-14 DIAGNOSIS — E785 Hyperlipidemia, unspecified: Secondary | ICD-10-CM | POA: Diagnosis present

## 2016-04-14 DIAGNOSIS — I340 Nonrheumatic mitral (valve) insufficiency: Secondary | ICD-10-CM | POA: Diagnosis not present

## 2016-04-14 DIAGNOSIS — K5900 Constipation, unspecified: Secondary | ICD-10-CM | POA: Diagnosis present

## 2016-04-14 DIAGNOSIS — I739 Peripheral vascular disease, unspecified: Secondary | ICD-10-CM | POA: Diagnosis present

## 2016-04-14 DIAGNOSIS — Z9842 Cataract extraction status, left eye: Secondary | ICD-10-CM

## 2016-04-14 DIAGNOSIS — S72145A Nondisplaced intertrochanteric fracture of left femur, initial encounter for closed fracture: Secondary | ICD-10-CM | POA: Diagnosis not present

## 2016-04-14 DIAGNOSIS — I252 Old myocardial infarction: Secondary | ICD-10-CM

## 2016-04-14 DIAGNOSIS — S82001A Unspecified fracture of right patella, initial encounter for closed fracture: Secondary | ICD-10-CM | POA: Diagnosis present

## 2016-04-14 DIAGNOSIS — Z87891 Personal history of nicotine dependence: Secondary | ICD-10-CM | POA: Diagnosis not present

## 2016-04-14 DIAGNOSIS — I255 Ischemic cardiomyopathy: Secondary | ICD-10-CM | POA: Diagnosis present

## 2016-04-14 DIAGNOSIS — I2510 Atherosclerotic heart disease of native coronary artery without angina pectoris: Secondary | ICD-10-CM | POA: Diagnosis present

## 2016-04-14 DIAGNOSIS — E440 Moderate protein-calorie malnutrition: Secondary | ICD-10-CM | POA: Insufficient documentation

## 2016-04-14 DIAGNOSIS — W19XXXA Unspecified fall, initial encounter: Secondary | ICD-10-CM

## 2016-04-14 DIAGNOSIS — R197 Diarrhea, unspecified: Secondary | ICD-10-CM | POA: Diagnosis present

## 2016-04-14 DIAGNOSIS — K591 Functional diarrhea: Secondary | ICD-10-CM | POA: Diagnosis not present

## 2016-04-14 DIAGNOSIS — D649 Anemia, unspecified: Secondary | ICD-10-CM

## 2016-04-14 DIAGNOSIS — W1830XA Fall on same level, unspecified, initial encounter: Secondary | ICD-10-CM | POA: Diagnosis present

## 2016-04-14 DIAGNOSIS — R52 Pain, unspecified: Secondary | ICD-10-CM

## 2016-04-14 DIAGNOSIS — D62 Acute posthemorrhagic anemia: Secondary | ICD-10-CM | POA: Diagnosis not present

## 2016-04-14 DIAGNOSIS — S72141A Displaced intertrochanteric fracture of right femur, initial encounter for closed fracture: Principal | ICD-10-CM

## 2016-04-14 DIAGNOSIS — Z961 Presence of intraocular lens: Secondary | ICD-10-CM | POA: Diagnosis present

## 2016-04-14 DIAGNOSIS — I482 Chronic atrial fibrillation: Secondary | ICD-10-CM | POA: Diagnosis present

## 2016-04-14 DIAGNOSIS — M25461 Effusion, right knee: Secondary | ICD-10-CM | POA: Diagnosis present

## 2016-04-14 DIAGNOSIS — Z7901 Long term (current) use of anticoagulants: Secondary | ICD-10-CM

## 2016-04-14 DIAGNOSIS — Z9841 Cataract extraction status, right eye: Secondary | ICD-10-CM

## 2016-04-14 DIAGNOSIS — Z6824 Body mass index (BMI) 24.0-24.9, adult: Secondary | ICD-10-CM

## 2016-04-14 DIAGNOSIS — T148XXA Other injury of unspecified body region, initial encounter: Secondary | ICD-10-CM

## 2016-04-14 LAB — COMPREHENSIVE METABOLIC PANEL
ALK PHOS: 44 U/L (ref 38–126)
ALT: 11 U/L — AB (ref 17–63)
ANION GAP: 8 (ref 5–15)
AST: 18 U/L (ref 15–41)
Albumin: 3.6 g/dL (ref 3.5–5.0)
BILIRUBIN TOTAL: 0.6 mg/dL (ref 0.3–1.2)
BUN: 37 mg/dL — ABNORMAL HIGH (ref 6–20)
CALCIUM: 8.9 mg/dL (ref 8.9–10.3)
CO2: 23 mmol/L (ref 22–32)
CREATININE: 1.74 mg/dL — AB (ref 0.61–1.24)
Chloride: 104 mmol/L (ref 101–111)
GFR calc Af Amer: 38 mL/min — ABNORMAL LOW (ref >60)
GFR calc non Af Amer: 33 mL/min — ABNORMAL LOW (ref >60)
GLUCOSE: 120 mg/dL — AB (ref 65–99)
Potassium: 4.8 mmol/L (ref 3.5–5.1)
Sodium: 135 mmol/L (ref 135–145)
TOTAL PROTEIN: 6.8 g/dL (ref 6.5–8.1)

## 2016-04-14 LAB — CBC WITH DIFFERENTIAL/PLATELET
Basophils Absolute: 0 10*3/uL (ref 0.0–0.1)
Basophils Relative: 0 %
Eosinophils Absolute: 0.3 10*3/uL (ref 0.0–0.7)
Eosinophils Relative: 4 %
HEMATOCRIT: 36.5 % — AB (ref 39.0–52.0)
HEMOGLOBIN: 12.5 g/dL — AB (ref 13.0–17.0)
LYMPHS ABS: 2.2 10*3/uL (ref 0.7–4.0)
Lymphocytes Relative: 27 %
MCH: 34.2 pg — AB (ref 26.0–34.0)
MCHC: 34.2 g/dL (ref 30.0–36.0)
MCV: 99.7 fL (ref 78.0–100.0)
MONOS PCT: 14 %
Monocytes Absolute: 1.1 10*3/uL — ABNORMAL HIGH (ref 0.1–1.0)
NEUTROS ABS: 4.5 10*3/uL (ref 1.7–7.7)
NEUTROS PCT: 55 %
Platelets: 152 10*3/uL (ref 150–400)
RBC: 3.66 MIL/uL — AB (ref 4.22–5.81)
RDW: 15.1 % (ref 11.5–15.5)
WBC: 8.3 10*3/uL (ref 4.0–10.5)

## 2016-04-14 LAB — TYPE AND SCREEN
ABO/RH(D): A POS
Antibody Screen: NEGATIVE

## 2016-04-14 LAB — CBG MONITORING, ED: Glucose-Capillary: 117 mg/dL — ABNORMAL HIGH (ref 65–99)

## 2016-04-14 MED ORDER — ONDANSETRON HCL 4 MG/2ML IJ SOLN
4.0000 mg | Freq: Once | INTRAMUSCULAR | Status: AC
  Administered 2016-04-14: 4 mg via INTRAVENOUS
  Filled 2016-04-14: qty 2

## 2016-04-14 MED ORDER — DIPHENHYDRAMINE HCL 12.5 MG/5ML PO ELIX: 12.5000 mg | ORAL_SOLUTION | Freq: Four times a day (QID) | ORAL | Status: DC | PRN

## 2016-04-14 MED ORDER — FINASTERIDE 5 MG PO TABS
ORAL_TABLET | ORAL | Status: AC
  Filled 2016-04-14: qty 1

## 2016-04-14 MED ORDER — NALOXONE HCL 0.4 MG/ML IJ SOLN: 0.4000 mg | INTRAMUSCULAR | Status: DC | PRN

## 2016-04-14 MED ORDER — AMIODARONE HCL 100 MG PO TABS
100.0000 mg | ORAL_TABLET | Freq: Every day | ORAL | Status: DC
  Administered 2016-04-15 – 2016-04-23 (×8): 100 mg via ORAL
  Filled 2016-04-14 (×7): qty 1

## 2016-04-14 MED ORDER — HYDROMORPHONE HCL 1 MG/ML IJ SOLN
0.5000 mg | Freq: Once | INTRAMUSCULAR | Status: AC
  Administered 2016-04-14: 0.5 mg via INTRAVENOUS
  Filled 2016-04-14: qty 1

## 2016-04-14 MED ORDER — SODIUM CHLORIDE 0.9 % IV BOLUS (SEPSIS)
1000.0000 mL | Freq: Once | INTRAVENOUS | Status: AC
  Administered 2016-04-14: 1000 mL via INTRAVENOUS

## 2016-04-14 MED ORDER — METHOCARBAMOL 1000 MG/10ML IJ SOLN
500.0000 mg | Freq: Four times a day (QID) | INTRAVENOUS | Status: DC | PRN
  Filled 2016-04-14: qty 5

## 2016-04-14 MED ORDER — HYDROCODONE-ACETAMINOPHEN 5-325 MG PO TABS
1.0000 | ORAL_TABLET | ORAL | Status: DC | PRN
Start: 2016-04-14 — End: 2016-04-21
  Administered 2016-04-14 – 2016-04-17 (×8): 2 via ORAL
  Administered 2016-04-18: 1 via ORAL
  Administered 2016-04-19: 2 via ORAL
  Administered 2016-04-19 (×2): 1 via ORAL
  Administered 2016-04-19: 2 via ORAL
  Administered 2016-04-20: 1 via ORAL
  Administered 2016-04-21: 2 via ORAL
  Filled 2016-04-14 (×2): qty 2
  Filled 2016-04-14: qty 1
  Filled 2016-04-14 (×8): qty 2
  Filled 2016-04-14 (×3): qty 1
  Filled 2016-04-14: qty 2

## 2016-04-14 MED ORDER — ATORVASTATIN CALCIUM 40 MG PO TABS
40.0000 mg | ORAL_TABLET | Freq: Every day | ORAL | Status: DC
  Administered 2016-04-14 – 2016-04-22 (×9): 40 mg via ORAL
  Filled 2016-04-14 (×9): qty 1

## 2016-04-14 MED ORDER — FAMOTIDINE 20 MG PO TABS
20.0000 mg | ORAL_TABLET | Freq: Every evening | ORAL | Status: DC
  Administered 2016-04-14 – 2016-04-22 (×9): 20 mg via ORAL
  Filled 2016-04-14 (×9): qty 1

## 2016-04-14 MED ORDER — DIPHENHYDRAMINE HCL 50 MG/ML IJ SOLN: 12.5000 mg | Freq: Four times a day (QID) | INTRAMUSCULAR | Status: DC | PRN

## 2016-04-14 MED ORDER — CARVEDILOL 6.25 MG PO TABS
6.2500 mg | ORAL_TABLET | Freq: Two times a day (BID) | ORAL | Status: DC
  Administered 2016-04-14 – 2016-04-17 (×7): 6.25 mg via ORAL
  Filled 2016-04-14 (×5): qty 2
  Filled 2016-04-14 (×2): qty 1

## 2016-04-14 MED ORDER — SODIUM CHLORIDE 0.9% FLUSH: 9.0000 mL | INTRAVENOUS | Status: DC | PRN

## 2016-04-14 MED ORDER — MORPHINE SULFATE 2 MG/ML IV SOLN: INTRAVENOUS | Status: DC

## 2016-04-14 MED ORDER — POLYETHYLENE GLYCOL 3350 17 G PO PACK
17.0000 g | PACK | Freq: Every day | ORAL | Status: DC | PRN
  Administered 2016-04-21: 17 g via ORAL

## 2016-04-14 MED ORDER — ONDANSETRON HCL 4 MG/2ML IJ SOLN: 4.0000 mg | Freq: Four times a day (QID) | INTRAMUSCULAR | Status: DC | PRN

## 2016-04-14 MED ORDER — MORPHINE SULFATE (PF) 2 MG/ML IV SOLN
2.0000 mg | INTRAVENOUS | Status: DC | PRN
  Administered 2016-04-14 – 2016-04-18 (×7): 2 mg via INTRAVENOUS
  Filled 2016-04-14 (×7): qty 1

## 2016-04-14 MED ORDER — LEVOTHYROXINE SODIUM 75 MCG PO TABS
75.0000 ug | ORAL_TABLET | Freq: Every day | ORAL | Status: DC
  Administered 2016-04-15 – 2016-04-23 (×8): 75 ug via ORAL
  Filled 2016-04-14 (×8): qty 1

## 2016-04-14 MED ORDER — METHOCARBAMOL 500 MG PO TABS
500.0000 mg | ORAL_TABLET | Freq: Four times a day (QID) | ORAL | Status: DC | PRN
  Administered 2016-04-14 – 2016-04-16 (×2): 500 mg via ORAL
  Filled 2016-04-14 (×2): qty 1

## 2016-04-14 MED ORDER — FINASTERIDE 5 MG PO TABS
5.0000 mg | ORAL_TABLET | Freq: Every evening | ORAL | Status: DC
  Administered 2016-04-14 – 2016-04-22 (×9): 5 mg via ORAL
  Filled 2016-04-14 (×9): qty 1

## 2016-04-14 NOTE — Progress Notes (Signed)
Morphine PCA is not available here at [facility]. Given the patient's advanced age, I am concerned about giving him Dilaudid PCA. For now, will give PO Vicodin 1-2 tabs q4h prn moderate pain with morphine 2 mg IV q2h prn breakthrough pain. If insufficient, will need to reconsider the PCA.  Georgana Curio, M.D.

## 2016-04-14 NOTE — Progress Notes (Signed)
Subjective:    Patient ID: Connor Diaz, male    DOB: Aug 16, 1923, 83 y.o.   MRN: [id number]  HPI Referred by Dr. Janna Arch for diarrhea. He has had diarrhea off and on for  6 months.  He tells me he had a infection and was treated with antibiotics x 2.  He says he has Lomotil as needed. He is having about 2-3 stools a day.  He says his stools are like water. He has urgency. Has not been on any recent antibiotics. Wife states at least 2 months. Hx of abdominal aortic aneuysm (5.6 x 5.7cm)  Hx of A fib, pacemaker and maintained on Eliquis.  Review of Systems Past Medical History:  Diagnosis Date  . AAA (abdominal aortic aneurysm) (HCC)    a. 02/2009 U/S 4.4 x 4.5 cm  . BPH (benign prostatic hyperplasia)   . CKD (chronic kidney disease), stage III   . Coronary artery disease   . DJD (degenerative joint disease)   . History of tobacco abuse    a. roughly 36 pack years, quit @ age 85.  Marland Kitchen Hypertension   . Hypothyroidism   . Non-ischemic cardiomyopathy (HCC)   . PAF (paroxysmal atrial fibrillation) (HCC)    a. noted 11/2011  . Pancreatitis    a. 10/2011 - managed conservatively @ home.  . Symptomatic bradycardia    a. s/p STJ dual chamber PPM   . Systolic heart failure (HCC)    02/2013    Past Surgical History:  Procedure Laterality Date  . CATARACT EXTRACTION W/ INTRAOCULAR LENS  IMPLANT, BILATERAL Bilateral 1980's  . CORONARY ANGIOGRAM  11/19/2011   Procedure: CORONARY ANGIOGRAM;  Surgeon: [name], MD;  Location: [facility] CATH LAB;  Service: Cardiovascular;;  . CORONARY ANGIOPLASTY WITH STENT PLACEMENT  12/22/2011    LAD & CIRCUMFLEX  . EYE SURGERY    . PERCUTANEOUS CORONARY STENT INTERVENTION (PCI-S) N/A 12/22/2011   Procedure: PERCUTANEOUS CORONARY STENT INTERVENTION (PCI-S);  Surgeon: [name], MD;  Location: [facility] CATH LAB;  Service: Cardiovascular;  Laterality: N/A;  . PERMANENT PACEMAKER INSERTION N/A 11/20/2011   STJ dual chamber PPM implanted by [name] for  symptomatic bradycardia   . TONSILLECTOMY  ~ 1933    Allergies  Allergen Reactions  . Iodinated Diagnostic Agents Rash and Swelling    Swelling in face, urticaria. Received Isovoc 370 (non-ionic) on 10/24/14.  . Isovue [Iopamidol] Itching and Swelling    Pt had slight erythema, itching and facial swelling right ear and right lower chin.  Pt sneezed several times after contrast injection.  Pt was monitored for 1 hr post injection and was given water to drink. Dr Gery Pray feels he had a very mild reaction.  Premeds are warranted in this patient.  Thanks, Gildardo Griffes    Current Outpatient Prescriptions on File Prior to Visit  Medication Sig Dispense Refill  . acetaminophen (TYLENOL) 500 MG tablet Take 500 mg by mouth daily.    Marland Kitchen Apixaban (ELIQUIS PO) Take 2.5 mg by mouth 2 (two) times daily.     Marland Kitchen atorvastatin (LIPITOR) 80 MG tablet Take 40 mg by mouth daily at 6 PM.    . cetirizine (ZYRTEC ALLERGY) 10 MG tablet Take 10 mg by mouth daily.    . famotidine (PEPCID) 20 MG tablet Take 20 mg by mouth daily.    . finasteride (PROSCAR) 5 MG tablet Take 5 mg by mouth daily.     . fish oil-omega-3 fatty acids 1000 MG capsule Take  1 g by mouth every morning.     . furosemide (LASIX) 40 MG tablet Take 1 tablet (40 mg total) by mouth daily. 30 tablet 0  . levothyroxine (SYNTHROID, LEVOTHROID) 75 MCG tablet Take 75 mcg by mouth daily before breakfast.  5  . nitroGLYCERIN (NITROSTAT) 0.4 MG SL tablet Place 1 tablet (0.4 mg total) under the tongue every 5 (five) minutes x 3 doses as needed for chest pain. 25 tablet 3  . polyethylene glycol powder (GLYCOLAX/MIRALAX) powder Take 17 g by mouth daily as needed for moderate constipation.     . ramipril (ALTACE) 10 MG capsule TAKE TWO CAPSULES (20 MG) BY MOUTH ONCE DAILY 180 capsule 3  . carvedilol (COREG) 6.25 MG tablet TAKE 1 TABLET BY MOUTH TWICE DAILY WITH MEALS. 60 tablet 6   No current facility-administered medications on file prior to visit.        Objective:    Physical Exam Blood pressure 110/64, pulse 60, temperature 97.4 F (36.3 C), height 5\' 10"  (1.778 m), weight 174 lb 1.6 oz (79 kg). Alert and oriented. Skin warm and dry. Oral mucosa is moist.   . Sclera anicteric, conjunctivae is pink. Thyroid not enlarged. No cervical lymphadenopathy. Lungs clear. Heart regular rate and rhythm.  Abdomen is soft. Bowel sounds are positive. No hepatomegaly. No abdominal masses felt. No tenderness.  No edema to lower extremities.          Assessment & Plan:  Diarrhea. Am going to get a GI pathogen. If positive for C-diff, will treat with Flagyl.

## 2016-04-14 NOTE — H&P (Signed)
History and Physical    Connor Diaz:[id number] DOB: June 06, 1923 DOA: 04/14/2016  PCP: [name], MD Consultants:  [name] - cardiology; Allred - cardiology; [facility] - in [place] Patient coming from:  Home - lives with wife; NOK: wife, [phone]  Chief Complaint: hip fracture  HPI: Connor Diaz is a 83 y.o. male with medical history significant of AAA; afib, on Eliquis; BPH; CKD stage 3; HTN; hypothyroidism; bradycardia s/p pacemaker placement; and CHF (EF 20-25% in 8/17) presenting with a right hip fracture.  Patient got up to set a clock and got dizzy and fell backwards about 3 pm.  He fell back onto carpet, unsure exactly how he landed.  Unable to move his right leg after falling.  Thought he hurt his knee, it was what was hurting - now it hurts from the knee to hip.    He went to see GI earlier today for chronic diarrhea.  About 6 months ago had diarrhea, was given an antibiotic.  Went back again about 2 months later and given the same antibiotic again.  Went back again this AM and the provider suggested stool studies for C diff.  No BM yesterday or today, but had about 3 stools day before yesterday (took Lomotil).  Usually 2-3 liquid stools daily.  Plan today was for GI pathogen panel and C. Diff.   ED Course: Right hip fracture.  Given Dilaudid and Zofran and 1L IVF bolus.  Dr. Romeo Apple will perform ORIF Thursday, holding Eliquis.  Review of Systems: As per HPI; otherwise 10 point review of systems reviewed and negative.   Ambulatory Status:  ambulated with walker prior to fall  Past Medical History:  Diagnosis Date  . AAA (abdominal aortic aneurysm) (HCC)    a. 02/2009 U/S 4.4 x 4.5 cm  . BPH (benign prostatic hyperplasia)   . CKD (chronic kidney disease), stage III   . Coronary artery disease   . DJD (degenerative joint disease)   . History of tobacco abuse    a. roughly 36 pack years, quit @ age 45.  Marland Kitchen Hypertension   . Hypothyroidism   .  Non-ischemic cardiomyopathy (HCC)   . PAF (paroxysmal atrial fibrillation) (HCC)    a. noted 11/2011  . Pancreatitis    a. 10/2011 - managed conservatively @ home.  . Symptomatic bradycardia    a. s/p STJ dual chamber PPM   . Systolic heart failure (HCC)    02/2013    Past Surgical History:  Procedure Laterality Date  . CATARACT EXTRACTION W/ INTRAOCULAR LENS  IMPLANT, BILATERAL Bilateral 1980's  . CORONARY ANGIOGRAM  11/19/2011   Procedure: CORONARY ANGIOGRAM;  Surgeon: [name], MD;  Location: [facility] CATH LAB;  Service: Cardiovascular;;  . CORONARY ANGIOPLASTY WITH STENT PLACEMENT  12/22/2011    LAD & CIRCUMFLEX  . EYE SURGERY    . PERCUTANEOUS CORONARY STENT INTERVENTION (PCI-S) N/A 12/22/2011   Procedure: PERCUTANEOUS CORONARY STENT INTERVENTION (PCI-S);  Surgeon: [name], MD;  Location: [facility] CATH LAB;  Service: Cardiovascular;  Laterality: N/A;  . PERMANENT PACEMAKER INSERTION N/A 11/20/2011   STJ dual chamber PPM implanted by [name] for symptomatic bradycardia   . TONSILLECTOMY  ~ 72    Social History   Social History  . Marital status: Married    Spouse name: N/A  . Number of children: N/A  . Years of education: N/A   Occupational History  . retired    Social History Main Topics  . Smoking status: Former  Smoker    Packs/day: 1.00    Years: 35.00    Types: Cigarettes    Quit date: 12/17/1980  . Smokeless tobacco: Never Used     Comment: Quit at age 76.  Marland Kitchen Alcohol use No  . Drug use: No  . Sexual activity: Not Currently   Other Topics Concern  . Not on file   Social History Narrative   Pt lives in [place] with his wife.  He is retired from [employer].  He is relatively active @ home.    Allergies  Allergen Reactions  . Iodinated Diagnostic Agents Rash and Swelling    Swelling in face, urticaria. Received Isovoc 370 (non-ionic) on 10/24/14.  . Isovue [Iopamidol] Itching and Swelling    Pt had slight erythema, itching and facial  swelling right ear and right lower chin.  Pt sneezed several times after contrast injection.  Pt was monitored for 1 hr post injection and was given water to drink. Dr Gery Pray feels he had a very mild reaction.  Premeds are warranted in this patient.  Thanks, Gildardo Griffes    Family History  Problem Relation Age of Onset  . Other Father     Accidental death @ age 35 - box fell on him at work  . Cancer Mother     died @ 14 Bladder cancer    Prior to Admission medications   Medication Sig Start Date End Date Taking? Authorizing Provider  acetaminophen (TYLENOL) 500 MG tablet Take 500 mg by mouth daily.   Yes Historical Provider, MD  amiodarone (PACERONE) 200 MG tablet Take 100 mg by mouth daily.   Yes Historical Provider, MD  apixaban (ELIQUIS) 2.5 MG TABS tablet Take 2.5 mg by mouth 2 (two) times daily.   Yes Historical Provider, MD  atorvastatin (LIPITOR) 80 MG tablet Take 40 mg by mouth daily at 6 PM. 11/23/11  Yes Rinaldo Cloud, MD  carvedilol (COREG) 6.25 MG tablet TAKE 1 TABLET BY MOUTH TWICE DAILY WITH MEALS. 03/24/16  Yes Amber Caryl Bis, NP  cetirizine (ZYRTEC ALLERGY) 10 MG tablet Take 10 mg by mouth daily as needed for allergies or rhinitis.    Yes Historical Provider, MD  famotidine (PEPCID) 20 MG tablet Take 20 mg by mouth every evening.  12/23/11  Yes Rinaldo Cloud, MD  finasteride (PROSCAR) 5 MG tablet Take 5 mg by mouth every evening.  02/08/12  Yes Historical Provider, MD  fish oil-omega-3 fatty acids 1000 MG capsule Take 1 g by mouth every morning.    Yes Historical Provider, MD  furosemide (LASIX) 40 MG tablet Take 1 tablet (40 mg total) by mouth daily. 02/25/13  Yes Pasty Spillers McLean-Scocuzza, MD  levothyroxine (SYNTHROID, LEVOTHROID) 75 MCG tablet Take 75 mcg by mouth daily before breakfast. 04/30/14  Yes Historical Provider, MD  linaclotide (LINZESS) 72 MCG capsule Take 72 mcg by mouth daily as needed (for constipation).   Yes Historical Provider, MD  Menthol, Topical Analgesic, (BLUE-EMU  MAXIMUM STRENGTH EX) Apply 1 application topically daily. Applied to knees   Yes Historical Provider, MD  nitroGLYCERIN (NITROSTAT) 0.4 MG SL tablet Place 1 tablet (0.4 mg total) under the tongue every 5 (five) minutes x 3 doses as needed for chest pain. 11/23/11  Yes Rinaldo Cloud, MD  polyethylene glycol powder (GLYCOLAX/MIRALAX) powder Take 17 g by mouth daily as needed for moderate constipation.  10/27/11  Yes Historical Provider, MD  ramipril (ALTACE) 10 MG capsule TAKE TWO CAPSULES (20 MG) BY MOUTH ONCE DAILY  08/26/15  Yes Hillis Range, MD    Physical Exam: Vitals:   04/14/16 1745 04/14/16 1800 04/14/16 1815 04/14/16 1830  BP:  117/82  116/77  Pulse: 69 70 70   Resp: 16 16 17 13   Temp:      TempSrc:      SpO2: 98% 99% 100%   Weight:      Height:         General:  Appears calm and comfortable and is NAD Eyes:  PERRL, EOMI, normal lids, iris ENT:  grossly normal hearing, lips & tongue, mmm Neck:  no LAD, masses or thyromegaly Cardiovascular:  RRR, no m/r/g. No LE edema.  Respiratory:  CTA bilaterally, no w/r/r. Normal respiratory effort. Abdomen:  soft, ntnd, NABS Skin:  no rash or induration seen on limited exam Musculoskeletal:  R leg with pain on palpation of hip, abrasion on knee, unable to move the leg Psychiatric:  grossly normal mood and affect, speech fluent and appropriate, AOx3 Neurologic:  CN 2-12 grossly intact, moves all extremities in coordinated fashion (other than above), sensation intact  Labs on Admission: I have personally reviewed following labs and imaging studies  CBC:  Recent Labs Lab 04/14/16 1609  WBC 8.3  NEUTROABS 4.5  HGB 12.5*  HCT 36.5*  MCV 99.7  PLT 152   Basic Metabolic Panel:  Recent Labs Lab 04/14/16 1609  NA 135  K 4.8  CL 104  CO2 23  GLUCOSE 120*  BUN 37*  CREATININE 1.74*  CALCIUM 8.9   GFR: Estimated Creatinine Clearance: 29.1 mL/min (by C-G formula based on SCr of 1.74 mg/dL (H)). Liver Function Tests:  Recent  Labs Lab 04/14/16 1609  AST 18  ALT 11*  ALKPHOS 44  BILITOT 0.6  PROT 6.8  ALBUMIN 3.6   No results for input(s): LIPASE, AMYLASE in the last 168 hours. No results for input(s): AMMONIA in the last 168 hours. Coagulation Profile: No results for input(s): INR, PROTIME in the last 168 hours. Cardiac Enzymes: No results for input(s): CKTOTAL, CKMB, CKMBINDEX, TROPONINI in the last 168 hours. BNP (last 3 results) No results for input(s): PROBNP in the last 8760 hours. HbA1C: No results for input(s): HGBA1C in the last 72 hours. CBG:  Recent Labs Lab 04/14/16 1604  GLUCAP 117*   Lipid Profile: No results for input(s): CHOL, HDL, LDLCALC, TRIG, CHOLHDL, LDLDIRECT in the last 72 hours. Thyroid Function Tests: No results for input(s): TSH, T4TOTAL, FREET4, T3FREE, THYROIDAB in the last 72 hours. Anemia Panel: No results for input(s): VITAMINB12, FOLATE, FERRITIN, TIBC, IRON, RETICCTPCT in the last 72 hours. Urine analysis: No results found for: COLORURINE, APPEARANCEUR, LABSPEC, PHURINE, GLUCOSEU, HGBUR, BILIRUBINUR, KETONESUR, PROTEINUR, UROBILINOGEN, NITRITE, LEUKOCYTESUR  Creatinine Clearance: Estimated Creatinine Clearance: 29.1 mL/min (by C-G formula based on SCr of 1.74 mg/dL (H)).  Sepsis Labs: @LABRCNTIP (procalcitonin:4,lacticidven:4) )No results found for this or any previous visit (from the past 240 hour(s)).   Radiological Exams on Admission: Dg Chest 1 View  Result Date: 04/14/2016 CLINICAL DATA:  Dizziness.  Fall. EXAM: CHEST 1 VIEW FINDINGS: Cardiac pacer with lead tips over the right atrium right ventricle. Cardiomegaly. Mild pulmonary vascular prominence with mild interstitial prominence. Mild CHF cannot be excluded. Underlying chronic interstitial lung disease most likely present. Small right pleural effusion cannot be excluded. Left costophrenic angle not imaged. No pneumothorax. No acute bony abnormalities. Degenerative changes both shoulders. IMPRESSION: 1.  Cardiac pacer stable position. Cardiomegaly with mild bilateral from interstitial prominence suggesting mild CHF. Tiny right pleural effusion  cannot be excluded. 2. Underlying chronic interstitial lung disease most likely present . Electronically Signed   By: Maisie Fus  Register   On: 04/14/2016 17:00   Dg Knee 1-2 Views Right  Result Date: 04/14/2016 CLINICAL DATA:  Fall. EXAM: RIGHT KNEE - 1-2 VIEW COMPARISON:  04/14/2016. FINDINGS: Soft tissue swelling. Small effusion cannot be excluded. Severe tricompartment degenerative change in osteopenia. Subtle fracture, age undetermined, of the superior aspect of patella cannot be excluded. Bipartite patella could also present in this fashion. Peripheral vascular calcification . IMPRESSION: 1. Soft tissue swelling. Small effusion cannot be excluded. Subtle fracture, age undetermined, superior aspect of the patella. Bipartite patella could also present in this fashion. 2. Diffuse osteopenia and severe degenerative change. 3. Peripheral vascular disease. Electronically Signed   By: Maisie Fus  Register   On: 04/14/2016 17:02   Dg Hip Unilat W Or Wo Pelvis 2-3 Views Right  Result Date: 04/14/2016 CLINICAL DATA:  Fall today with right hip pain and deformity EXAM: DG HIP (WITH OR WITHOUT PELVIS) 2-3V RIGHT COMPARISON:  None. FINDINGS: Bones are diffusely demineralized. Comminuted intertrochanteric right femoral neck fracture noted with varus angulation. Lesser trochanter exists as a free fragment. SI joints and symphysis pubis unremarkable. Distal aortic and iliac artery atherosclerosis evident. IMPRESSION: 1. Comminuted intertrochanteric right femoral neck fracture with varus angulation. 2.  Abdominal Aortic Atherosclerois (ICD10-170.0) Electronically Signed   By: Kennith Center M.D.   On: 04/14/2016 17:01    EKG: Independently reviewed.  Paced rhythm with rate 68; no evidence of STEMI  Assessment/Plan Principal Problem:   Hip fracture (HCC) Active Problems:    Aneurysm of abdominal vessel (HCC)   Non-ischemic cardiomyopathy (HCC)   Paroxysmal atrial fibrillation (HCC)   Diarrhea   CKD (chronic kidney disease) stage 3, GFR 30-59 ml/min   Hip fracture -Mechanical fall resulting in hip fracture -Orthopedics consulted by ER, plan for OR possibly Thursday -NPO after midnight Wednesday in anticipation of surgical repair  -SCDs for now, start Lovenox post-operatively (or as per ortho) -Hold Eliquis until post-operative and approved by orthopedics -Pain control with reduced-dose morphine PCA - the patient had received 0.5 mg Dilaudid in the ER with only slight improvement and he will have roughly 48 hours until surgery and so reasonable pain control will be important -SW consult for rehab placement -Will need PT consult post-operatively -Hip fracture order set utilized -Incidentally, he also has a subtle supra-patellar fracture.  CHF, ER 20% -Poor operative candidate (previously declined by Dr. [name] for knee replacement for this reason) -Unfortunately, there is little other option -With CKD, holding Altace for now; consider resuming post-operatively  Afib -Rate controlled with pacer, also on Amiodarone -CHA2DS2-VASc score is 5, with a 6.7% annual stroke rate -It is not clear whether his stroke risk of higher than his bleeding risk at this point, and may depend on how well he does post-operatively  CKD -Creatinine 1.74, prior 1.5 in 2015, 1.7 in 4/17 -Will follow -Avoid nephrotoxic medications when possible  Diarrhea -He is prescribed both Miralax and Linzess for constipation -As such, the h/o loose stools seems less concerning -It is also chronic and he reports he has received abx twice for this issue -Will not order GI panel or C diff at this time and will simply follow   DVT prophylaxis: SCDs Code Status:  DNR - confirmed with patient/family Family Communication: Wife present throughout evaluation  Disposition Plan: Likely to need  SNF rehab; SW consult requested Consults called: Orthopedics; Dr. Delbert Harness to follow inpatient  Admission status: Admit - It is my clinical opinion that admission to INPATIENT is reasonable and necessary because this patient will require at least 2 midnights in the hospital to treat this condition based on the medical complexity of the problems presented.  Given the aforementioned information, the predictability of an adverse outcome is felt to be significant.    Jonah Blue MD [facility]  If 7PM-7AM, please contact night-coverage [email or web address] Password [facility]  04/14/2016, 8:13 PM

## 2016-04-14 NOTE — ED Provider Notes (Signed)
[facility]-EMER[facility]ENCY DEPT Provider Note   CSN: [id number] Arrival date & time: 04/14/16  1549     History   Chief Complaint Chief Complaint  Patient presents with  . Dizziness  . Fall    HPI Connor Diaz is a 83 y.o. male.  The patient became dizzy and fell over on his right hip. He did not hit his head. Patient complains of right hip pain   The history is provided by the patient.  Dizziness  Quality:  Lightheadedness Severity:  Mild Onset quality:  Sudden Duration: 20 seconds. Timing:  Rare Progression:  Resolved Chronicity:  New Context: not with head movement   Associated symptoms: no chest pain, no diarrhea and no headaches   Fall  Pertinent negatives include no chest pain, no abdominal pain and no headaches.    Past Medical History:  Diagnosis Date  . AAA (abdominal aortic aneurysm) (HCC)    a. 02/2009 U/S 4.4 x 4.5 cm  . BPH (benign prostatic hyperplasia)   . CKD (chronic kidney disease), stage III   . Coronary artery disease   . DJD (degenerative joint disease)   . History of tobacco abuse    a. roughly 36 pack years, quit @ age 19.  Marland Kitchen Hypertension   . Hypothyroidism   . Non-ischemic cardiomyopathy (HCC)   . PAF (paroxysmal atrial fibrillation) (HCC)    a. noted 11/2011  . Pancreatitis    a. 10/2011 - managed conservatively @ home.  . Symptomatic bradycardia    a. s/p STJ dual chamber PPM   . Systolic heart failure (HCC)    02/2013    Patient Active Problem List   Diagnosis Date Noted  . Hip fracture (HCC) 04/14/2016  . Paroxysmal atrial fibrillation (HCC) 09/27/2015  . Non-ischemic cardiomyopathy (HCC)   . Respiratory failure with hypoxia (HCC) 02/24/2013  . Bronchopneumonia 02/22/2013  . SOB (shortness of breath) 02/22/2013  . Acute combined systolic and diastolic heart failure (HCC) 02/22/2013  . CAP (community acquired pneumonia) 02/22/2013  . Cellulitis of left lower extremity 02/22/2013  . Abdominal aneurysm without mention of rupture  03/07/2012  . Bradycardia 03/04/2012  . CAD (coronary artery disease) 03/04/2012  . Pacemaker-St. Jude 11/23/2011    Past Surgical History:  Procedure Laterality Date  . CATARACT EXTRACTION W/ INTRAOCULAR LENS  IMPLANT, BILATERAL Bilateral 1980's  . CORONARY ANGIOGRAM  11/19/2011   Procedure: CORONARY ANGIOGRAM;  Surgeon: [name], MD;  Location: [facility] CATH LAB;  Service: Cardiovascular;;  . CORONARY ANGIOPLASTY WITH STENT PLACEMENT  12/22/2011    LAD & CIRCUMFLEX  . EYE SURGERY    . PERCUTANEOUS CORONARY STENT INTERVENTION (PCI-S) N/A 12/22/2011   Procedure: PERCUTANEOUS CORONARY STENT INTERVENTION (PCI-S);  Surgeon: [name], MD;  Location: [facility] CATH LAB;  Service: Cardiovascular;  Laterality: N/A;  . PERMANENT PACEMAKER INSERTION N/A 11/20/2011   STJ dual chamber PPM implanted by [name] for symptomatic bradycardia   . TONSILLECTOMY  ~ 1933       Home Medications    Prior to Admission medications   Medication Sig Start Date End Date Taking? Authorizing Provider  acetaminophen (TYLENOL) 500 MG tablet Take 500 mg by mouth daily.   Yes Historical Provider, MD  amiodarone (PACERONE) 200 MG tablet Take 100 mg by mouth daily.   Yes Historical Provider, MD  apixaban (ELIQUIS) 2.5 MG TABS tablet Take 2.5 mg by mouth 2 (two) times daily.   Yes Historical Provider, MD  atorvastatin (LIPITOR) 80 MG tablet Take 40 mg  by mouth daily at 6 PM. 11/23/11  Yes Rinaldo Cloud, MD  carvedilol (COREG) 6.25 MG tablet TAKE 1 TABLET BY MOUTH TWICE DAILY WITH MEALS. 03/24/16  Yes Amber Caryl Bis, NP  cetirizine (ZYRTEC ALLERGY) 10 MG tablet Take 10 mg by mouth daily as needed for allergies or rhinitis.    Yes Historical Provider, MD  famotidine (PEPCID) 20 MG tablet Take 20 mg by mouth every evening.  12/23/11  Yes Rinaldo Cloud, MD  finasteride (PROSCAR) 5 MG tablet Take 5 mg by mouth every evening.  02/08/12  Yes Historical Provider, MD  fish oil-omega-3 fatty acids 1000 MG capsule Take 1 g by  mouth every morning.    Yes Historical Provider, MD  furosemide (LASIX) 40 MG tablet Take 1 tablet (40 mg total) by mouth daily. 02/25/13  Yes Pasty Spillers McLean-Scocuzza, MD  levothyroxine (SYNTHROID, LEVOTHROID) 75 MCG tablet Take 75 mcg by mouth daily before breakfast. 04/30/14  Yes Historical Provider, MD  linaclotide (LINZESS) 72 MCG capsule Take 72 mcg by mouth daily as needed (for constipation).   Yes Historical Provider, MD  Menthol, Topical Analgesic, (BLUE-EMU MAXIMUM STRENGTH EX) Apply 1 application topically daily. Applied to knees   Yes Historical Provider, MD  nitroGLYCERIN (NITROSTAT) 0.4 MG SL tablet Place 1 tablet (0.4 mg total) under the tongue every 5 (five) minutes x 3 doses as needed for chest pain. 11/23/11  Yes Rinaldo Cloud, MD  polyethylene glycol powder (GLYCOLAX/MIRALAX) powder Take 17 g by mouth daily as needed for moderate constipation.  10/27/11  Yes Historical Provider, MD  ramipril (ALTACE) 10 MG capsule TAKE TWO CAPSULES (20 MG) BY MOUTH ONCE DAILY 08/26/15  Yes Hillis Range, MD    Family History Family History  Problem Relation Age of Onset  . Other Father     Accidental death @ age 17 - box fell on him at work  . Cancer Mother     died @ 68 Bladder cancer    Social History Social History  Substance Use Topics  . Smoking status: Former Smoker    Packs/day: 1.00    Years: 35.00    Types: Cigarettes    Quit date: 12/17/1980  . Smokeless tobacco: Never Used     Comment: Quit at age 29.  Marland Kitchen Alcohol use No     Allergies   Iodinated diagnostic agents and Isovue [iopamidol]   Review of Systems Review of Systems  Constitutional: Negative for appetite change and fatigue.  HENT: Negative for congestion, ear discharge and sinus pressure.   Eyes: Negative for discharge.  Respiratory: Negative for cough.   Cardiovascular: Negative for chest pain.  Gastrointestinal: Negative for abdominal pain and diarrhea.  Genitourinary: Negative for frequency and hematuria.    Musculoskeletal: Negative for back pain.       Right hip pain  Skin: Negative for rash.  Neurological: Positive for dizziness. Negative for seizures and headaches.  Psychiatric/Behavioral: Negative for hallucinations.     Physical Exam Updated Vital Signs BP 118/75   Pulse 70   Temp 98.1 F (36.7 C) (Oral)   Resp 13   Ht 5\' 10"  (1.778 m)   Wt 174 lb (78.9 kg)   SpO2 (!) 86%   BMI 24.97 kg/m   Physical Exam  Constitutional: He is oriented to person, place, and time. He appears well-developed.  HENT:  Head: Normocephalic.  Eyes: Conjunctivae and EOM are normal. No scleral icterus.  Neck: Neck supple. No thyromegaly present.  Cardiovascular: Normal rate and regular rhythm.  Exam reveals no gallop and no friction rub.   No murmur heard. Pulmonary/Chest: No stridor. He has no wheezes. He has no rales. He exhibits no tenderness.  Abdominal: He exhibits no distension. There is no tenderness. There is no rebound.  Musculoskeletal: Normal range of motion. He exhibits no edema.  Tenderness to right hip.  Lymphadenopathy:    He has no cervical adenopathy.  Neurological: He is oriented to person, place, and time. He exhibits normal muscle tone. Coordination normal.  Skin: No rash noted. No erythema.  Psychiatric: He has a normal mood and affect. His behavior is normal.     ED Treatments / Results  Labs (all labs ordered are listed, but only abnormal results are displayed) Labs Reviewed  CBC WITH DIFFERENTIAL/PLATELET - Abnormal; Notable for the following:       Result Value   RBC 3.66 (*)    Hemoglobin 12.5 (*)    HCT 36.5 (*)    MCH 34.2 (*)    Monocytes Absolute 1.1 (*)    All other components within normal limits  COMPREHENSIVE METABOLIC PANEL - Abnormal; Notable for the following:    Glucose, Bld 120 (*)    BUN 37 (*)    Creatinine, Ser 1.74 (*)    ALT 11 (*)    GFR calc non Af Amer 33 (*)    GFR calc Af Amer 38 (*)    All other components within normal limits   CBG MONITORING, ED - Abnormal; Notable for the following:    Glucose-Capillary 117 (*)    All other components within normal limits    EKG  EKG Interpretation  Date/Time:  Tuesday April 14 2016 15:59:02 EST Ventricular Rate:  68 PR Interval:    QRS Duration: 231 QT Interval:  511 QTC Calculation: 544 R Axis:   -66 Text Interpretation:  Paced rhythm No further analysis attempted due to paced rhythm No STEMI.  Confirmed by LONG MD, JOSHUA [phone]) on 04/14/2016 4:08:59 PM       Radiology Dg Chest 1 View  Result Date: 04/14/2016 CLINICAL DATA:  Dizziness.  Fall. EXAM: CHEST 1 VIEW FINDINGS: Cardiac pacer with lead tips over the right atrium right ventricle. Cardiomegaly. Mild pulmonary vascular prominence with mild interstitial prominence. Mild CHF cannot be excluded. Underlying chronic interstitial lung disease most likely present. Small right pleural effusion cannot be excluded. Left costophrenic angle not imaged. No pneumothorax. No acute bony abnormalities. Degenerative changes both shoulders. IMPRESSION: 1. Cardiac pacer stable position. Cardiomegaly with mild bilateral from interstitial prominence suggesting mild CHF. Tiny right pleural effusion cannot be excluded. 2. Underlying chronic interstitial lung disease most likely present . Electronically Signed   By: Maisie Fus  Register   On: 04/14/2016 17:00   Dg Knee 1-2 Views Right  Result Date: 04/14/2016 CLINICAL DATA:  Fall. EXAM: RIGHT KNEE - 1-2 VIEW COMPARISON:  04/14/2016. FINDINGS: Soft tissue swelling. Small effusion cannot be excluded. Severe tricompartment degenerative change in osteopenia. Subtle fracture, age undetermined, of the superior aspect of patella cannot be excluded. Bipartite patella could also present in this fashion. Peripheral vascular calcification . IMPRESSION: 1. Soft tissue swelling. Small effusion cannot be excluded. Subtle fracture, age undetermined, superior aspect of the patella. Bipartite patella could  also present in this fashion. 2. Diffuse osteopenia and severe degenerative change. 3. Peripheral vascular disease. Electronically Signed   By: Maisie Fus  Register   On: 04/14/2016 17:02   Dg Hip Unilat W Or Wo Pelvis 2-3 Views Right  Result Date: 04/14/2016 CLINICAL  DATA:  Fall today with right hip pain and deformity EXAM: DG HIP (WITH OR WITHOUT PELVIS) 2-3V RIGHT COMPARISON:  None. FINDINGS: Bones are diffusely demineralized. Comminuted intertrochanteric right femoral neck fracture noted with varus angulation. Lesser trochanter exists as a free fragment. SI joints and symphysis pubis unremarkable. Distal aortic and iliac artery atherosclerosis evident. IMPRESSION: 1. Comminuted intertrochanteric right femoral neck fracture with varus angulation. 2.  Abdominal Aortic Atherosclerois (ICD10-170.0) Electronically Signed   By: Kennith Center M.D.   On: 04/14/2016 17:01    Procedures Procedures (including critical care time)  Medications Ordered in ED Medications  HYDROmorphone (DILAUDID) injection 0.5 mg (0.5 mg Intravenous Given 04/14/16 1619)  ondansetron (ZOFRAN) injection 4 mg (4 mg Intravenous Given 04/14/16 1619)  sodium chloride 0.9 % bolus 1,000 mL (1,000 mLs Intravenous New Bag/Given 04/14/16 1757)     Initial Impression / Assessment and Plan / ED Course  I have reviewed the triage vital signs and the nursing notes.  Pertinent labs & imaging results that were available during my care of the patient were reviewed by me and considered in my medical decision making (see chart for details).     Patient with a right hip fracture. Patient will be admitted by medicine and Dr. Romeo Apple the orthopedic surgeon will see him tomorrow and possibly fix his hip on Thursday  Final Clinical Impressions(s) / ED Diagnoses   Final diagnoses:  Closed fracture of right hip, initial encounter [facility])    New Prescriptions New Prescriptions   No medications on file     Bethann Berkshire, MD 04/14/16 1801

## 2016-04-14 NOTE — ED Triage Notes (Signed)
PT states he went to his PCP today d/t x3 days of diarrhea. PT states around 1500 he stood up from his chair at home and became dizziness and fell onto his left leg/hip. PT alert and oriented and denies any LOC or weakness today. Right leg appears to be shortened and externally rotated.

## 2016-04-14 NOTE — Patient Instructions (Signed)
GI pathogen   

## 2016-04-15 ENCOUNTER — Inpatient Hospital Stay (HOSPITAL_COMMUNITY): Payer: Medicare Other

## 2016-04-15 DIAGNOSIS — S72145A Nondisplaced intertrochanteric fracture of left femur, initial encounter for closed fracture: Secondary | ICD-10-CM

## 2016-04-15 DIAGNOSIS — I340 Nonrheumatic mitral (valve) insufficiency: Secondary | ICD-10-CM

## 2016-04-15 LAB — ECHOCARDIOGRAM COMPLETE
HEIGHTINCHES: 70 in
Weight: 2761.92 oz

## 2016-04-15 LAB — BASIC METABOLIC PANEL
ANION GAP: 5 (ref 5–15)
BUN: 38 mg/dL — ABNORMAL HIGH (ref 6–20)
CALCIUM: 8.3 mg/dL — AB (ref 8.9–10.3)
CHLORIDE: 106 mmol/L (ref 101–111)
CO2: 24 mmol/L (ref 22–32)
Creatinine, Ser: 1.79 mg/dL — ABNORMAL HIGH (ref 0.61–1.24)
GFR calc non Af Amer: 32 mL/min — ABNORMAL LOW (ref >60)
GFR, EST AFRICAN AMERICAN: 37 mL/min — AB (ref >60)
GLUCOSE: 110 mg/dL — AB (ref 65–99)
Potassium: 5.3 mmol/L — ABNORMAL HIGH (ref 3.5–5.1)
Sodium: 135 mmol/L (ref 135–145)

## 2016-04-15 LAB — CBC
HEMATOCRIT: 31.5 % — AB (ref 39.0–52.0)
HEMOGLOBIN: 10.6 g/dL — AB (ref 13.0–17.0)
MCH: 33.9 pg (ref 26.0–34.0)
MCHC: 33.7 g/dL (ref 30.0–36.0)
MCV: 100.6 fL — AB (ref 78.0–100.0)
Platelets: 137 10*3/uL — ABNORMAL LOW (ref 150–400)
RBC: 3.13 MIL/uL — ABNORMAL LOW (ref 4.22–5.81)
RDW: 15.2 % (ref 11.5–15.5)
WBC: 9.5 10*3/uL (ref 4.0–10.5)

## 2016-04-15 MED ORDER — ENOXAPARIN SODIUM 80 MG/0.8ML ~~LOC~~ SOLN
80.0000 mg | SUBCUTANEOUS | Status: DC
  Administered 2016-04-15 – 2016-04-17 (×3): 80 mg via SUBCUTANEOUS
  Filled 2016-04-15 (×3): qty 0.8

## 2016-04-15 MED ORDER — ONDANSETRON HCL 4 MG/2ML IJ SOLN: 4.0000 mg | INTRAMUSCULAR | Status: DC | PRN

## 2016-04-15 MED ORDER — ONDANSETRON HCL 4 MG PO TABS
4.0000 mg | ORAL_TABLET | ORAL | Status: DC | PRN
  Administered 2016-04-15 – 2016-04-17 (×3): 4 mg via ORAL
  Filled 2016-04-15 (×3): qty 1

## 2016-04-15 NOTE — Progress Notes (Signed)
83 year old male with comminuted fracture of trochanteric.  Ischemic cardiomyopathy ejection fraction 25% 3 stents and coronary arteries previously. Chronic A. fib on eliquis. With 6.4 cm AAA reported by patient at [facility] patient is preop however considered high risk and will require tertiary care center will try to obtain ultrasound of AAA 2-D echo for LV function and cardiology consultation ASAP patient is alert and oriented Connor Diaz TDH:[id number] DOB: 23-Jun-1923 DOA: 04/14/2016 PCP: Maricela Curet, MD   Physical Exam: Blood pressure 112/64, pulse 72, temperature 98.5 F (36.9 C), temperature source Oral, resp. rate 20, height '5\' 10"'$  (1.778 m), weight 78.3 kg (172 lb 9.9 oz), SpO2 98 %. Cranor pursing intact patient alert and oriented 3 lungs diminished breath sounds in the bases prolonged history phase no rales no wheezes appreciable heart irregular regular no S3 no heaves thrills rubs abdomen soft nontender bowel sounds normoactive   Investigations:  No results found for this or any previous visit (from the past 240 hour(s)).   Basic Metabolic Panel:  Recent Labs  04/14/16 1609 04/15/16 0550  NA 135 135  K 4.8 5.3*  CL 104 106  CO2 23 24  GLUCOSE 120* 110*  BUN 37* 38*  CREATININE 1.74* 1.79*  CALCIUM 8.9 8.3*   Liver Function Tests:  Recent Labs  04/14/16 1609  AST 18  ALT 11*  ALKPHOS 44  BILITOT 0.6  PROT 6.8  ALBUMIN 3.6     CBC:  Recent Labs  04/14/16 1609 04/15/16 0550  WBC 8.3 9.5  NEUTROABS 4.5  --   HGB 12.5* 10.6*  HCT 36.5* 31.5*  MCV 99.7 100.6*  PLT 152 137*    Dg Chest 1 View  Result Date: 04/14/2016 CLINICAL DATA:  Dizziness.  Fall. EXAM: CHEST 1 VIEW FINDINGS: Cardiac pacer with lead tips over the right atrium right ventricle. Cardiomegaly. Mild pulmonary vascular prominence with mild interstitial prominence. Mild CHF cannot be excluded. Underlying chronic interstitial lung disease most likely present. Small right  pleural effusion cannot be excluded. Left costophrenic angle not imaged. No pneumothorax. No acute bony abnormalities. Degenerative changes both shoulders. IMPRESSION: 1. Cardiac pacer stable position. Cardiomegaly with mild bilateral from interstitial prominence suggesting mild CHF. Tiny right pleural effusion cannot be excluded. 2. Underlying chronic interstitial lung disease most likely present . Electronically Signed   By: Marcello Moores  Register   On: 04/14/2016 17:00   Dg Knee 1-2 Views Right  Result Date: 04/14/2016 CLINICAL DATA:  Fall. EXAM: RIGHT KNEE - 1-2 VIEW COMPARISON:  04/14/2016. FINDINGS: Soft tissue swelling. Small effusion cannot be excluded. Severe tricompartment degenerative change in osteopenia. Subtle fracture, age undetermined, of the superior aspect of patella cannot be excluded. Bipartite patella could also present in this fashion. Peripheral vascular calcification . IMPRESSION: 1. Soft tissue swelling. Small effusion cannot be excluded. Subtle fracture, age undetermined, superior aspect of the patella. Bipartite patella could also present in this fashion. 2. Diffuse osteopenia and severe degenerative change. 3. Peripheral vascular disease. Electronically Signed   By: Marcello Moores  Register   On: 04/14/2016 17:02   Dg Hip Unilat W Or Wo Pelvis 2-3 Views Right  Result Date: 04/14/2016 CLINICAL DATA:  Fall today with right hip pain and deformity EXAM: DG HIP (WITH OR WITHOUT PELVIS) 2-3V RIGHT COMPARISON:  None. FINDINGS: Bones are diffusely demineralized. Comminuted intertrochanteric right femoral neck fracture noted with varus angulation. Lesser trochanter exists as a free fragment. SI joints and symphysis pubis unremarkable. Distal aortic and iliac artery atherosclerosis evident. IMPRESSION: 1.  Comminuted intertrochanteric right femoral neck fracture with varus angulation. 2.  Abdominal Aortic Atherosclerois (ICD10-170.0) Electronically Signed   By: Misty Stanley M.D.   On: 04/14/2016 17:01       Medicatio  Impression:  Principal Problem:   Hip fracture ([place]) Active Problems:   Aneurysm of abdominal vessel (HCC)   Non-ischemic cardiomyopathy (HCC)   Paroxysmal atrial fibrillation (HCC)   Diarrhea   CKD (chronic kidney disease) stage 3, GFR 30-59 ml/min     Plan: 2-D echo for LV function. Abdominal ultrasound to assess AAA. Cardiology consultation for preop assessment. B met daily 3. Eliquis on  HOLD. LOVENOX ORDERED   Consultants: Orthopedics and cardiology requested   Procedures 2-D echo and abdominal ultrasound   Antibiotics:          Time spent: 40 minutes   LOS: 1 day   Breven Guidroz M   04/15/2016, 11:19 AM

## 2016-04-15 NOTE — Progress Notes (Signed)
*  PRELIMINARY RESULTS* Echocardiogram 2D Echocardiogram has been performed.  Connor Diaz 04/15/2016, 12:18 PM

## 2016-04-15 NOTE — Consult Note (Signed)
Consult-ORTHOPAEDICS  Dr Janna Arch  For RT HIP FRACTURE   PCP: [name], MD Consultants:  [facility] - cardiology; [facility] - cardiology; [facility] - in [place] Patient coming from:  Home - lives with wife; NOK: wife, [phone]  Chief Complaint: hip fracture  HPI: Connor Diaz is a 83 y.o. male with medical history significant of AAA; afib, on Eliquis; BPH; CKD stage 3; HTN; hypothyroidism; bradycardia s/p pacemaker placement; and CHF (EF 20-25% in 8/17) presenting with a right hip fracture.  Patient got up to set a clock and got dizzy and fell backwards about 3 pm.  He fell back onto carpet, unsure exactly how he landed.  Unable to move his right leg after falling.  Thought he hurt his knee, it was what was hurting - now it hurts from the knee to hip.    He went to see GI earlier today for chronic diarrhea.  About 6 months ago had diarrhea, was given an antibiotic.  Went back again about 2 months later and given the same antibiotic again.  Went back again this AM and the provider suggested stool studies for C diff.  No BM yesterday or today, but had about 3 stools day before yesterday (took Lomotil).  Usually 2-3 liquid stools daily.  Plan today was for GI pathogen panel and C. Diff.   ED Course: Right hip fracture.  Given Dilaudid and Zofran and 1L IVF bolus., holding Eliquis. TOOK TUES AM  Review of Systems: As per HPI; otherwise 10 point review of systems reviewed and negative.   Review of Systems  Unable to perform ROS: Other (ON PAIN MEDS)  Constitutional: Negative for fever.  HENT: Negative for nosebleeds.   Eyes: Negative for pain.  Respiratory: Positive for shortness of breath. Negative for cough.   Cardiovascular: Negative for chest pain.  Gastrointestinal: Positive for diarrhea.  Genitourinary: Negative for flank pain.  Musculoskeletal: Positive for joint pain and myalgias.  Skin: Negative for rash.  Neurological: Negative for speech change.   Endo/Heme/Allergies: Negative for environmental allergies and polydipsia. Does not bruise/bleed easily.  Psychiatric/Behavioral: Negative for substance abuse.   Past Medical History:  Diagnosis Date  . AAA (abdominal aortic aneurysm) (HCC)    a. 02/2009 U/S 4.4 x 4.5 cm  . BPH (benign prostatic hyperplasia)   . CKD (chronic kidney disease), stage III   . Coronary artery disease   . DJD (degenerative joint disease)   . History of tobacco abuse    a. roughly 36 pack years, quit @ age 15.  Marland Kitchen Hypertension   . Hypothyroidism   . Non-ischemic cardiomyopathy (HCC)   . PAF (paroxysmal atrial fibrillation) (HCC)    a. noted 11/2011  . Pancreatitis    a. 10/2011 - managed conservatively @ home.  . Symptomatic bradycardia    a. s/p STJ dual chamber PPM   . Systolic heart failure (HCC)    02/2013   Past Surgical History:  Procedure Laterality Date  . CATARACT EXTRACTION W/ INTRAOCULAR LENS  IMPLANT, BILATERAL Bilateral 1980's  . CORONARY ANGIOGRAM  11/19/2011   Procedure: CORONARY ANGIOGRAM;  Surgeon: [name], MD;  Location: [facility] CATH LAB;  Service: Cardiovascular;;  . CORONARY ANGIOPLASTY WITH STENT PLACEMENT  12/22/2011    LAD & CIRCUMFLEX  . EYE SURGERY    . PERCUTANEOUS CORONARY STENT INTERVENTION (PCI-S) N/A 12/22/2011   Procedure: PERCUTANEOUS CORONARY STENT INTERVENTION (PCI-S);  Surgeon: [name], MD;  Location: [facility] CATH LAB;  Service: Cardiovascular;  Laterality: N/A;  .  PERMANENT PACEMAKER INSERTION N/A 11/20/2011   STJ dual chamber PPM implanted by [name] for symptomatic bradycardia   . TONSILLECTOMY  ~ 1933    Family History  Problem Relation Age of Onset  . Other Father     Accidental death @ age 46 - box fell on him at work  . Cancer Mother     died @ 79 Bladder cancer   Social History  Substance Use Topics  . Smoking status: Former Smoker    Packs/day: 1.00    Years: 35.00    Types: Cigarettes    Quit date: 12/17/1980  . Smokeless tobacco: Never Used      Comment: Quit at age 4.  Marland Kitchen Alcohol use No   Physical Exam  Constitutional: He is oriented to person, place, and time. He appears well-developed and well-nourished. No distress.  HENT:  Head: Normocephalic and atraumatic.  Right Ear: External ear normal.  Left Ear: External ear normal.  Nose: Nose normal.  Eyes: Conjunctivae and EOM are normal. Pupils are equal, round, and reactive to light. Right eye exhibits no discharge. Left eye exhibits no discharge.  Neck: Neck supple. No tracheal deviation present. No thyromegaly present.  Neck range of motion compromised by age but no new restrictions  Cardiovascular: Intact distal pulses.   Pulmonary/Chest: Effort normal and breath sounds normal. No stridor. No respiratory distress. He has no wheezes. He has no rales.  Abdominal: Soft. He exhibits no distension. There is no guarding.  Lymphadenopathy:    He has no cervical adenopathy.  Neurological: He is alert and oriented to person, place, and time. He has normal reflexes. He displays normal reflexes. No cranial nerve deficit. He exhibits normal muscle tone. Coordination normal.  Skin: Skin is warm and dry. He is not diaphoretic.  Psychiatric: He has a normal mood and affect. His behavior is normal. Judgment and thought content normal.   Right and left upper extremity  and left lower extremity normal range of motion stability strength alignment and muscle tone  Right lower extremity externally rotated shortened tender in the proximal femur, the range of motion assessment was deferred because of pain, stability tests were delayed because of pain but we observed normal articulation of the knee and ankle. Muscle tone was normal.  CBC Latest Ref Rng & Units 04/15/2016 04/14/2016 02/24/2013  WBC 4.0 - 10.5 K/uL 9.5 8.3 6.7  Hemoglobin 13.0 - 17.0 g/dL 10.6(L) 12.5(L) 13.4  Hematocrit 39.0 - 52.0 % 31.5(L) 36.5(L) 38.9(L)  Platelets 150 - 400 K/uL 137(L) 152 190   BMP Latest Ref Rng & Units  04/15/2016 04/14/2016 10/17/2014  Glucose 65 - 99 mg/dL 569(V) 948(A) -  BUN 6 - 20 mg/dL 16(P) 53(Z) 48(O)  Creatinine 0.61 - 1.24 mg/dL 7.07(E) 6.75(Q) 4.92(E)  Sodium 135 - 145 mmol/L 135 135 -  Potassium 3.5 - 5.1 mmol/L 5.3(H) 4.8 -  Chloride 101 - 111 mmol/L 106 104 -  CO2 22 - 32 mmol/L 24 23 -  Calcium 8.9 - 10.3 mg/dL 8.3(L) 8.9 -   [place] 2016 AAA 5CM X 5CM   Diagnosis right hip intertrochanteric fracture Chronic diarrhea.pmh Past Medical History:  Diagnosis Date  . AAA (abdominal aortic aneurysm) (HCC)    a. 02/2009 U/S 4.4 x 4.5 cm  . BPH (benign prostatic hyperplasia)   . CKD (chronic kidney disease), stage III   . Coronary artery disease   . DJD (degenerative joint disease)   . History of tobacco abuse    a. roughly  36 pack years, quit @ age 54.  Marland Kitchen Hypertension   . Hypothyroidism   . Non-ischemic cardiomyopathy (HCC)   . PAF (paroxysmal atrial fibrillation) (HCC)    a. noted 11/2011  . Pancreatitis    a. 10/2011 - managed conservatively @ home.  . Symptomatic bradycardia    a. s/p STJ dual chamber PPM   . Systolic heart failure (HCC)    02/2013     Assessment: 83 year old male with medical problems as described above including history of AAA last imaged on 2016 measuring 5 x 5 cm history of atrial fibrillation on Ellik was stage III chronic kidney disease hypertension hypothyroidism pacemaker for bradycardia with congestive heart failure with ejection fraction 25%.  I was unaware of the patient's full medical history when I was consult about this in the emergency room, after looking at this history as I have just described the patient should be transferred to tertiary care facility for definitive care.  He tells me that he had abdominal ultrasound about 6 months ago in the aneurysm was up to 6-1/2 cm  He should probably have cardiology consult as well as echocardiogram prior to surgery  Definitive fixation of the hip fracture should probably be intramedullary  nailing with a cephalic medullary device

## 2016-04-15 NOTE — Progress Notes (Signed)
ABD [place] results called to Dr. Janna Arch

## 2016-04-15 NOTE — Progress Notes (Signed)
ANTICOAGULATION CONSULT NOTE - Initial Consult  Pharmacy Consult for lovenox Indication: atrial fibrillation  Allergies  Allergen Reactions  . Iodinated Diagnostic Agents Rash and Swelling    Swelling in face, urticaria. Received Isovoc 370 (non-ionic) on 10/24/14.  . Isovue [Iopamidol] Itching and Swelling    Pt had slight erythema, itching and facial swelling right ear and right lower chin.  Pt sneezed several times after contrast injection.  Pt was monitored for 1 hr post injection and was given water to drink. Dr Gery Pray feels he had a very mild reaction.  Premeds are warranted in this patient.  Thanks, Gildardo Griffes    Patient Measurements: Height: 5\' 10"  (177.8 cm) Weight: 172 lb 9.9 oz (78.3 kg) IBW/kg (Calculated) : 73   Vital Signs: Temp: 98.5 F (36.9 C) (02/28 0824) Temp Source: Oral (02/28 0824) BP: 112/64 (02/28 0824) Pulse Rate: 72 (02/28 0824)  Labs:  Recent Labs  04/14/16 1609 04/15/16 0550  HGB 12.5* 10.6*  HCT 36.5* 31.5*  PLT 152 137*  CREATININE 1.74* 1.79*    Estimated Creatinine Clearance: 28.3 mL/min (by C-G formula based on SCr of 1.79 mg/dL (H)).   Medical History: Past Medical History:  Diagnosis Date  . AAA (abdominal aortic aneurysm) (HCC)    a. 02/2009 U/S 4.4 x 4.5 cm  . BPH (benign prostatic hyperplasia)   . CKD (chronic kidney disease), stage III   . Coronary artery disease   . DJD (degenerative joint disease)   . History of tobacco abuse    a. roughly 36 pack years, quit @ age 78.  Marland Kitchen Hypertension   . Hypothyroidism   . Non-ischemic cardiomyopathy (HCC)   . PAF (paroxysmal atrial fibrillation) (HCC)    a. noted 11/2011  . Pancreatitis    a. 10/2011 - managed conservatively @ home.  . Symptomatic bradycardia    a. s/p STJ dual chamber PPM   . Systolic heart failure (HCC)    02/2013    Medications:  Prescriptions Prior to Admission  Medication Sig Dispense Refill Last Dose  . acetaminophen (TYLENOL) 500 MG tablet Take 500 mg by  mouth daily.   04/14/2016 at morning  . amiodarone (PACERONE) 200 MG tablet Take 100 mg by mouth daily.   04/14/2016 at Unknown time  . apixaban (ELIQUIS) 2.5 MG TABS tablet Take 2.5 mg by mouth 2 (two) times daily.   04/14/2016 at 800a  . atorvastatin (LIPITOR) 80 MG tablet Take 40 mg by mouth daily at 6 PM.   04/13/2016 at Unknown time  . carvedilol (COREG) 6.25 MG tablet TAKE 1 TABLET BY MOUTH TWICE DAILY WITH MEALS. 60 tablet 6 04/14/2016 at 800a  . cetirizine (ZYRTEC ALLERGY) 10 MG tablet Take 10 mg by mouth daily as needed for allergies or rhinitis.    unknown  . famotidine (PEPCID) 20 MG tablet Take 20 mg by mouth every evening.    04/13/2016 at Unknown time  . finasteride (PROSCAR) 5 MG tablet Take 5 mg by mouth every evening.    04/13/2016 at Unknown time  . fish oil-omega-3 fatty acids 1000 MG capsule Take 1 g by mouth every morning.    04/14/2016 at Unknown time  . furosemide (LASIX) 40 MG tablet Take 1 tablet (40 mg total) by mouth daily. 30 tablet 0 04/14/2016 at Unknown time  . levothyroxine (SYNTHROID, LEVOTHROID) 75 MCG tablet Take 75 mcg by mouth daily before breakfast.  5 04/14/2016 at `  . linaclotide (LINZESS) 72 MCG capsule Take 72 mcg by mouth  daily as needed (for constipation).   Past Week at Unknown time  . Menthol, Topical Analgesic, (BLUE-EMU MAXIMUM STRENGTH EX) Apply 1 application topically daily. Applied to knees   04/14/2016 at Unknown time  . nitroGLYCERIN (NITROSTAT) 0.4 MG SL tablet Place 1 tablet (0.4 mg total) under the tongue every 5 (five) minutes x 3 doses as needed for chest pain. 25 tablet 3 unknown  . polyethylene glycol powder (GLYCOLAX/MIRALAX) powder Take 17 g by mouth daily as needed for moderate constipation.    unknown  . ramipril (ALTACE) 10 MG capsule TAKE TWO CAPSULES (20 MG) BY MOUTH ONCE DAILY 180 capsule 3 04/14/2016 at Unknown time    Assessment: 83 yo man who was on eliquis PTA will be changed to lovenox in anticipation for surgery.  His last dose of  eliquis was yesterday am.  His CrCl <30 ml/min. Goal of Therapy:  therpapeutic antiXa level Monitor platelets by anticoagulation protocol: Yes   Plan:  Lovenox 80 mg sq q24 hours Monitor renal function and CBC Monitor for bleeding complications  Connor Diaz 04/15/2016,11:42 AM

## 2016-04-16 LAB — BASIC METABOLIC PANEL
Anion gap: 9 (ref 5–15)
BUN: 45 mg/dL — AB (ref 6–20)
CALCIUM: 8.8 mg/dL — AB (ref 8.9–10.3)
CO2: 23 mmol/L (ref 22–32)
CREATININE: 2.26 mg/dL — AB (ref 0.61–1.24)
Chloride: 104 mmol/L (ref 101–111)
GFR calc Af Amer: 28 mL/min — ABNORMAL LOW (ref >60)
GFR, EST NON AFRICAN AMERICAN: 24 mL/min — AB (ref >60)
GLUCOSE: 123 mg/dL — AB (ref 65–99)
Potassium: 5.6 mmol/L — ABNORMAL HIGH (ref 3.5–5.1)
Sodium: 136 mmol/L (ref 135–145)

## 2016-04-16 MED ORDER — ADULT MULTIVITAMIN W/MINERALS CH
1.0000 | ORAL_TABLET | Freq: Every day | ORAL | Status: DC
  Administered 2016-04-16 – 2016-04-23 (×7): 1 via ORAL
  Filled 2016-04-16 (×7): qty 1

## 2016-04-16 MED ORDER — ENSURE ENLIVE PO LIQD
237.0000 mL | Freq: Two times a day (BID) | ORAL | Status: DC
  Administered 2016-04-16: 237 mL via ORAL

## 2016-04-16 NOTE — Progress Notes (Signed)
Initial Nutrition Assessment  Pt meets criteria for Moderate Malnutrition in the context of chronic illness (diarrhea) as evidenced by enery intake <75% for > 1 month and mild-moderate muscle loss (temporal, clavicle and dorsal) regions.  INTERVENTION:  Limit high potassium foods: orange juice, bananas nuts, potatoes  Heart Healthy diet  MVI daily   NUTRITION DIAGNOSIS:   Increased nutrient needs related to   hip fx, as evidenced by estimated needs.   GOAL:   Patient will meet greater than or equal to 90% of their needs to maintain lean body mass   MONITOR:   PO intake, Labs, Weight trends  REASON FOR ASSESSMENT:   Consult Hip fracture protocol  ASSESSMENT: The patient ( Connor Diaz) is a very pleasant 83 yo male with hx of HTN,  CKD-3, AAA, CAD (s/p pacemaker), and CHF. He fell at home and fractured his right hip. He also has an abrasion on his right knee. GI assessed him on 2/27 for chronic diarrhea but since admission he has not had a BM.  His appetite is fair currently (d-3). His lunch tray is still here and he has consumed 100% of protein, only a couple bites of mashed potatoes and fruit. Breakfast documented as 45%.  At home his wife says they usually eat cheerios, with fruit, walnuts plus toast with orange juice. At lunch is usually on peanut butter crackers or other similar light snack. In the evening then they have a hot meal with protein, starch and vegetable. His favorite beverage is orange juice. No problems with chewing or swallowing per pt.   Connor Pelosi denies weight changes and based on his weight hx 78-81 kg is his usual body wt range.  Nutrition-Focused physical exam completed. Findings are mild fat depletion (arms), mild  muscle depletion (temporal) moderate (dorsal and clavicles), and mild edema to right foot. Suspect that pt is chronically failing to meet est needs given his 6 month diarrhea problem.    Recent Labs Lab 04/14/16 1609 04/15/16 0550  04/16/16 0613  NA 135 135 136  K 4.8 5.3* 5.6*  CL 104 106 104  CO2 23 24 23   BUN 37* 38* 45*  CREATININE 1.74* 1.79* 2.26*  CALCIUM 8.9 8.3* 8.8*  GLUCOSE 120* 110* 123*   Labs: Hyperkalemia, BUN and Cr also elevated   Meds: pepcid, levothyroxine, norco  Diet Order:  Diet Heart Room service appropriate? Yes; Fluid consistency: Thin  Skin:  Reviewed, no issues -abrasion to right knee from fall per pt  Last BM:  2/26 liquid stool per pt. No diarrhea since admission per pt  Height:   Ht Readings from Last 1 Encounters:  04/14/16 5\' 10"  (1.778 m)    Weight:   Wt Readings from Last 1 Encounters:  04/14/16 172 lb 9.9 oz (78.3 kg)    Ideal Body Weight:  75 kg  BMI:  Body mass index is 24.77 kg/m.  Estimated Nutritional Needs:   Kcal:  1900-2100 kcal  Protein:  68-72 gr  Fluid:  1.9 liters daily   EDUCATION NEEDS:   No education needs identified at this time  Royann Shivers MS,RD,CSG,LDN Office: #[phone] Pager: [phone]

## 2016-04-16 NOTE — Progress Notes (Signed)
patient educated on importance of repositioning while in bed yesterday AM to prevent pressure injury. Repositioning offered several times through out day.  Patient refused frequently, unless around meal times and siting up to eat. Pain increases with movement.  PRN pain meds given as needed. Education reinforced this morning.

## 2016-04-16 NOTE — Plan of Care (Signed)
Problem: Bowel/Gastric: Goal: Will not experience complications related to bowel motility Outcome: Progressing Pt currently on bedpan to have BM

## 2016-04-16 NOTE — Discharge Summary (Signed)
Physician Discharge Summary  Connor Diaz YIF:[id number] DOB: 1923-05-17 DOA: 04/14/2016  PCP: [name], MD  Admit date: 04/14/2016 Discharge date: 04/16/2016   Recommendations for Outpatient Follow-up:  Patient is being transferred to [facility] accepting medical doctor and orthopod orthopedic surgeon are not known at this time this note is done in anticipation of finding and within several hours he has a comminuted fracture of his right trochanteric femur with significant comorbidities of ischemic cardiomyopathy ejection fraction 25-30% 3 coronary stents placed previously and infrarenal abdominal aortic aneurysm measuring 6.8 and 7.4 m in transverse diameter by sonogram yesterday chronic atrial fibrillation patient was on Eliquis until 48 hours ago currently on Lovenox for A. fib patient appears hemodynamically stable at present no clinical signs of active ischemia or congestive heart failure over baseline. Discharge Diagnoses:  Principal Problem:   Hip fracture (HCC) Active Problems:   Aneurysm of abdominal vessel (HCC)   Non-ischemic cardiomyopathy (HCC)   Paroxysmal atrial fibrillation (HCC)   Diarrhea   CKD (chronic kidney disease) stage 3, GFR 30-59 ml/min   Discharge Condition: Guarded for transfer to [facility]  Filed Weights   04/14/16 1601 04/14/16 2024  Weight: 78.9 kg (174 lb) 78.3 kg (172 lb 9.9 oz)    History of present illness:  The patient is a 83 year old white male most pleasant alert and oriented who has a comminuted fracture of the right femur head trochanteric portion. He was seen by orthopedic surgeon felt he was too high risk due to multiple comorbidities and he is being transferred to [facility] for high risk possible surgical intervention. His comorbidities include ischemic cardiomyopathy ejection fraction 25-30% with more inferobasilar hypokinesis been remaining segments of myocardium. He has chronic atrial  fibrillation and was on L course until 48 hours ago on admission currently on Lovenox for atrial fibrillation. He has an infrarenal abdominal aortic aneurysm measuring at 2 transverse diameter 6.8 and 7.4 cm. He has seen vascular surgeon who did not recommend surgery less than a month ago at [facility] presumably felt it was too high risk to patient. The patient is alert and ambulatory in his regular routine he is active and ambulating with his wife without assistance prior to this fall. He appears hemodynamically stable at present with no active ischemia or increased congestive heart failure over baseline and he is transferred to [facility] to a medical team and hopefully a orthopedic surgeon to be contacted later today for presumed high risk surgery and anesthesia  Hospital Course:  See history of present illness above  Procedures:  2-D echocardiogram and abdominal sonogram performed yesterday  Consultations:  Orthopedic surgery and cardiology  Discharge Instructions    Allergies  Allergen Reactions  . Iodinated Diagnostic Agents Rash and Swelling    Swelling in face, urticaria. Received Isovoc 370 (non-ionic) on 10/24/14.  . Isovue [Iopamidol] Itching and Swelling    Pt had slight erythema, itching and facial swelling right ear and right lower chin.  Pt sneezed several times after contrast injection.  Pt was monitored for 1 hr post injection and was given water to drink. Dr Gery Pray feels he had a very mild reaction.  Premeds are warranted in this patient.  Thanks, Gildardo Griffes      The results of significant diagnostics from this hospitalization (including imaging, microbiology, ancillary and laboratory) are listed below for reference.    Significant Diagnostic Studies: Dg Chest 1 View  Result Date: 04/14/2016 CLINICAL DATA:  Dizziness.  Fall. EXAM: CHEST 1 VIEW FINDINGS: Cardiac pacer with lead tips over the right atrium right ventricle. Cardiomegaly. Mild pulmonary vascular  prominence with mild interstitial prominence. Mild CHF cannot be excluded. Underlying chronic interstitial lung disease most likely present. Small right pleural effusion cannot be excluded. Left costophrenic angle not imaged. No pneumothorax. No acute bony abnormalities. Degenerative changes both shoulders. IMPRESSION: 1. Cardiac pacer stable position. Cardiomegaly with mild bilateral from interstitial prominence suggesting mild CHF. Tiny right pleural effusion cannot be excluded. 2. Underlying chronic interstitial lung disease most likely present . Electronically Signed   By: Maisie Fus  Register   On: 04/14/2016 17:00   Dg Knee 1-2 Views Right  Result Date: 04/14/2016 CLINICAL DATA:  Fall. EXAM: RIGHT KNEE - 1-2 VIEW COMPARISON:  04/14/2016. FINDINGS: Soft tissue swelling. Small effusion cannot be excluded. Severe tricompartment degenerative change in osteopenia. Subtle fracture, age undetermined, of the superior aspect of patella cannot be excluded. Bipartite patella could also present in this fashion. Peripheral vascular calcification . IMPRESSION: 1. Soft tissue swelling. Small effusion cannot be excluded. Subtle fracture, age undetermined, superior aspect of the patella. Bipartite patella could also present in this fashion. 2. Diffuse osteopenia and severe degenerative change. 3. Peripheral vascular disease. Electronically Signed   By: Maisie Fus  Register   On: 04/14/2016 17:02   US Aorta  Result Date: 04/15/2016 CLINICAL DATA:  Follow-up of an abdominal aortic aneurysm measuring 5.6 x 5.7 cm seen on CT angiogram of the abdomen pelvis of October 24, 2014 EXAM: ULTRASOUND OF ABDOMINAL AORTA TECHNIQUE: Ultrasound examination of the abdominal aorta was performed to evaluate for abdominal aortic aneurysm. COMPARISON:  CT angiogram of October 24, 2014 FINDINGS: Abdominal Aorta The proximal and mid aorta are obscured by bowel gas. There is an infrarenal abdominal aortic aneurysm measuring 6.7 cm AP x 7.2 cm  transversely. There is considerable mural plaque and thrombus. The luminal diameter is approximately 3.5 cm. The aneurysm is 9.2 cm in length. The right common iliac artery measures 1.6 x 1.8 cm. The left common iliac artery measures 2.1 x 1.7 cm. Maximum Diameter: 7.2 cm in the transverse plane. IMPRESSION: Interval growth of an infrarenal abdominal aortic aneurysm which measures 6.7 cm AP x 7.2 cm transversely. It is 9.2 cm in length and extends into the left common iliac artery. The proximal and mid aorta could not be imaged due to bowel gas and the patient's clinical condition which limited changes in position. These results will be called to the ordering clinician or representative by the Radiologist Assistant, and communication documented in the PACS or zVision Dashboard. Electronically Signed   By: David  [place] M.D.   On: 04/15/2016 13:42   Dg Hip Unilat W Or Wo Pelvis 2-3 Views Right  Result Date: 04/14/2016 CLINICAL DATA:  Fall today with right hip pain and deformity EXAM: DG HIP (WITH OR WITHOUT PELVIS) 2-3V RIGHT COMPARISON:  None. FINDINGS: Bones are diffusely demineralized. Comminuted intertrochanteric right femoral neck fracture noted with varus angulation. Lesser trochanter exists as a free fragment. SI joints and symphysis pubis unremarkable. Distal aortic and iliac artery atherosclerosis evident. IMPRESSION: 1. Comminuted intertrochanteric right femoral neck fracture with varus angulation. 2.  Abdominal Aortic Atherosclerois (ICD10-170.0) Electronically Signed   By: Kennith Center M.D.   On: 04/14/2016 17:01    Microbiology: No results found for this or any previous visit (from the past 240 hour(s)).   Labs: Basic Metabolic Panel:  Recent Labs Lab 04/14/16 1609 04/15/16 0550  NA 135 135  K 4.8 5.3*  CL 104 106  CO2 23 24  GLUCOSE 120* 110*  BUN 37* 38*  CREATININE 1.74* 1.79*  CALCIUM 8.9 8.3*   Liver Function Tests:  Recent Labs Lab 04/14/16 1609  AST 18  ALT 11*    ALKPHOS 44  BILITOT 0.6  PROT 6.8  ALBUMIN 3.6   No results for input(s): LIPASE, AMYLASE in the last 168 hours. No results for input(s): AMMONIA in the last 168 hours. CBC:  Recent Labs Lab 04/14/16 1609 04/15/16 0550  WBC 8.3 9.5  NEUTROABS 4.5  --   HGB 12.5* 10.6*  HCT 36.5* 31.5*  MCV 99.7 100.6*  PLT 152 137*   Cardiac Enzymes: No results for input(s): CKTOTAL, CKMB, CKMBINDEX, TROPONINI in the last 168 hours. BNP: BNP (last 3 results) No results for input(s): BNP in the last 8760 hours.  ProBNP (last 3 results) No results for input(s): PROBNP in the last 8760 hours.  CBG:  Recent Labs Lab 04/14/16 1604  GLUCAP 117*       Signed:  Keyuana Wank M  [facility] Pager: [phone] 04/16/2016, 6:41 AM

## 2016-04-16 NOTE — Progress Notes (Signed)
Patient transferring to [facility] by carelink - report called and given to [place], Charity fundraiser.  Patient stable and in NAD at time of trasfer

## 2016-04-17 ENCOUNTER — Inpatient Hospital Stay (HOSPITAL_COMMUNITY): Payer: Medicare Other

## 2016-04-17 DIAGNOSIS — S72001A Fracture of unspecified part of neck of right femur, initial encounter for closed fracture: Secondary | ICD-10-CM

## 2016-04-17 DIAGNOSIS — N183 Chronic kidney disease, stage 3 (moderate): Secondary | ICD-10-CM

## 2016-04-17 DIAGNOSIS — E440 Moderate protein-calorie malnutrition: Secondary | ICD-10-CM | POA: Insufficient documentation

## 2016-04-17 DIAGNOSIS — I714 Abdominal aortic aneurysm, without rupture: Secondary | ICD-10-CM

## 2016-04-17 DIAGNOSIS — K591 Functional diarrhea: Secondary | ICD-10-CM

## 2016-04-17 DIAGNOSIS — I428 Other cardiomyopathies: Secondary | ICD-10-CM

## 2016-04-17 DIAGNOSIS — I480 Paroxysmal atrial fibrillation: Secondary | ICD-10-CM

## 2016-04-17 MED ORDER — SODIUM CHLORIDE 0.9 % IV BOLUS (SEPSIS): 500.0000 mL | Freq: Once | INTRAVENOUS | Status: DC

## 2016-04-17 MED ORDER — SENNOSIDES-DOCUSATE SODIUM 8.6-50 MG PO TABS
1.0000 | ORAL_TABLET | Freq: Two times a day (BID) | ORAL | Status: DC
  Administered 2016-04-18 – 2016-04-23 (×9): 1 via ORAL
  Filled 2016-04-17 (×10): qty 1

## 2016-04-17 MED ORDER — BOOST / RESOURCE BREEZE PO LIQD
1.0000 | Freq: Three times a day (TID) | ORAL | Status: DC
  Administered 2016-04-17 – 2016-04-22 (×12): 1 via ORAL

## 2016-04-17 MED ORDER — SODIUM CHLORIDE 0.9 % IV BOLUS (SEPSIS)
500.0000 mL | Freq: Once | INTRAVENOUS | Status: AC
  Administered 2016-04-17: 500 mL via INTRAVENOUS

## 2016-04-17 MED ORDER — SODIUM CHLORIDE 0.9 % IV SOLN
INTRAVENOUS | Status: DC
  Administered 2016-04-17: 12:00:00 via INTRAVENOUS
  Administered 2016-04-18: 1000 mL via INTRAVENOUS
  Administered 2016-04-18: 05:00:00 via INTRAVENOUS

## 2016-04-17 NOTE — Consult Note (Signed)
ORTHOPAEDIC CONSULTATION  REQUESTING PHYSICIAN: Ripudeep Krystal Eaton, MD  Chief Complaint: right intertroch fracture  HPI: Connor Diaz is a 83 y.o. male who complains of a fall on Monday when he stood up Is in fell backwards. He was originally presented to [facility] he was admitted there for a few days after review of his health status they requested transfer to Hospital He Was Transferred to the Hospitalist Service Here.  He Has Had Severe Pain at His Right Groin He's Been Unable to Mobilize. He Has an extensive past medical history  Past Medical History:  Diagnosis Date  . AAA (abdominal aortic aneurysm) ([place])    a. 02/2009 U/S 4.4 x 4.5 cm  . BPH (benign prostatic hyperplasia)   . CKD (chronic kidney disease), stage III   . Coronary artery disease   . DJD (degenerative joint disease)   . History of tobacco abuse    a. roughly 36 pack years, quit @ age 77.  Marland Kitchen Hypertension   . Hypothyroidism   . Non-ischemic cardiomyopathy ([place])   . PAF (paroxysmal atrial fibrillation) ([place])    a. noted 11/2011  . Pancreatitis    a. 10/2011 - managed conservatively @ home.  . Symptomatic bradycardia    a. s/p STJ dual chamber PPM   . Systolic heart failure ([place])    02/2013   Past Surgical History:  Procedure Laterality Date  . CATARACT EXTRACTION W/ INTRAOCULAR LENS  IMPLANT, BILATERAL Bilateral 1980's  . CORONARY ANGIOGRAM  11/19/2011   Procedure: CORONARY ANGIOGRAM;  Surgeon: Clent Demark, MD;  Location: [facility] CATH LAB;  Service: Cardiovascular;;  . CORONARY ANGIOPLASTY WITH STENT PLACEMENT  12/22/2011    LAD & CIRCUMFLEX  . EYE SURGERY    . PERCUTANEOUS CORONARY STENT INTERVENTION (PCI-S) N/A 12/22/2011   Procedure: PERCUTANEOUS CORONARY STENT INTERVENTION (PCI-S);  Surgeon: Clent Demark, MD;  Location: [facility] CATH LAB;  Service: Cardiovascular;  Laterality: N/A;  . PERMANENT PACEMAKER INSERTION N/A 11/20/2011   STJ dual chamber PPM implanted by Dr Rayann Heman for symptomatic  bradycardia   . TONSILLECTOMY  ~ 27   Social History   Social History  . Marital status: Married    Spouse name: N/A  . Number of children: N/A  . Years of education: N/A   Occupational History  . retired    Social History Main Topics  . Smoking status: Former Smoker    Packs/day: 1.00    Years: 35.00    Types: Cigarettes    Quit date: 12/17/1980  . Smokeless tobacco: Never Used     Comment: Quit at age 73.  Marland Kitchen Alcohol use No  . Drug use: No  . Sexual activity: Not Currently   Other Topics Concern  . None   Social History Narrative   Pt lives in [place] with his wife.  He is retired from [employer].  He is relatively active @ home.   Family History  Problem Relation Age of Onset  . Other Father     Accidental death @ age 18 - box fell on him at work  . Cancer Mother     died @ 72 Bladder cancer   Allergies  Allergen Reactions  . Iodinated Diagnostic Agents Rash and Swelling    Swelling in face, urticaria. Received Isovoc 370 (non-ionic) on 10/24/14.  . Isovue [Iopamidol] Itching and Swelling    Pt had slight erythema, itching and facial swelling right ear and right lower chin.  Pt  sneezed several times after contrast injection.  Pt was monitored for 1 hr post injection and was given water to drink. Dr Alvester Chou feels he had a very mild reaction.  Premeds are warranted in this patient.  Thanks, Alfonse Alpers   Prior to Admission medications   Medication Sig Start Date End Date Taking? Authorizing Provider  acetaminophen (TYLENOL) 500 MG tablet Take 500 mg by mouth daily.   Yes Historical Provider, MD  amiodarone (PACERONE) 200 MG tablet Take 100 mg by mouth daily.   Yes Historical Provider, MD  apixaban (ELIQUIS) 2.5 MG TABS tablet Take 2.5 mg by mouth 2 (two) times daily.   Yes Historical Provider, MD  atorvastatin (LIPITOR) 80 MG tablet Take 40 mg by mouth daily at 6 PM. 11/23/11  Yes Charolette Forward, MD  carvedilol (COREG) 6.25 MG tablet TAKE 1 TABLET BY MOUTH  TWICE DAILY WITH MEALS. 03/24/16  Yes Amber Sena Slate, NP  cetirizine (ZYRTEC ALLERGY) 10 MG tablet Take 10 mg by mouth daily as needed for allergies or rhinitis.    Yes Historical Provider, MD  famotidine (PEPCID) 20 MG tablet Take 20 mg by mouth every evening.  12/23/11  Yes Charolette Forward, MD  finasteride (PROSCAR) 5 MG tablet Take 5 mg by mouth every evening.  02/08/12  Yes Historical Provider, MD  fish oil-omega-3 fatty acids 1000 MG capsule Take 1 g by mouth every morning.    Yes Historical Provider, MD  furosemide (LASIX) 40 MG tablet Take 1 tablet (40 mg total) by mouth daily. 02/25/13  Yes Nino Glow McLean-Scocuzza, MD  levothyroxine (SYNTHROID, LEVOTHROID) 75 MCG tablet Take 75 mcg by mouth daily before breakfast. 04/30/14  Yes Historical Provider, MD  linaclotide (LINZESS) 72 MCG capsule Take 72 mcg by mouth daily as needed (for constipation).   Yes Historical Provider, MD  Menthol, Topical Analgesic, (BLUE-EMU MAXIMUM STRENGTH EX) Apply 1 application topically daily. Applied to knees   Yes Historical Provider, MD  nitroGLYCERIN (NITROSTAT) 0.4 MG SL tablet Place 1 tablet (0.4 mg total) under the tongue every 5 (five) minutes x 3 doses as needed for chest pain. 11/23/11  Yes Charolette Forward, MD  polyethylene glycol powder (GLYCOLAX/MIRALAX) powder Take 17 g by mouth daily as needed for moderate constipation.  10/27/11  Yes Historical Provider, MD  ramipril (ALTACE) 10 MG capsule TAKE TWO CAPSULES (20 MG) BY MOUTH ONCE DAILY 08/26/15  Yes Thompson Grayer, MD   Dg Abd Portable 2v  Result Date: 04/17/2016 CLINICAL DATA:  Abdominal distention. EXAM: PORTABLE ABDOMEN - 2 VIEW COMPARISON:  CT abdomen and pelvis 10/24/2014. FINDINGS: No free intraperitoneal air is identified. There is mild gaseous distention of small and large bowel diffusely. Prominent stool burden ascending colon noted. IMPRESSION: Bowel gas pattern most in keeping with ileus. Large stool burden ascending colon. Electronically Signed   By: Inge Rise M.D.   On: 04/17/2016 12:11    Positive ROS: All other systems have been reviewed and were otherwise negative with the exception of those mentioned in the HPI and as above.  Labs cbc  Recent Labs  04/15/16 0550  WBC 9.5  HGB 10.6*  HCT 31.5*  PLT 137*    Labs inflam No results for input(s): CRP in the last 72 hours.  Invalid input(s): ESR  Labs coag No results for input(s): INR, PTT in the last 72 hours.  Invalid input(s): PT   Recent Labs  04/15/16 0550 04/16/16 0613  NA 135 136  K 5.3* 5.6*  CL 106  104  CO2 24 23  GLUCOSE 110* 123*  BUN 38* 45*  CREATININE 1.79* 2.26*  CALCIUM 8.3* 8.8*    Physical Exam: Vitals:   04/17/16 1300 04/17/16 1800  BP: (!) 71/52 (!) 77/46  Pulse: 70 69  Resp: 16   Temp: 98 F (36.7 C)    General: Alert, no acute distress Cardiovascular: No pedal edema Respiratory: No cyanosis, no use of accessory musculature GI: No organomegaly, abdomen is soft and non-tender Skin: No lesions in the area of chief complaint other than those listed below in MSK exam.  Neurologic: Sensation intact distally save for the below mentioned MSK exam Psychiatric: Patient is competent for consent with normal mood and affect Lymphatic: No axillary or cervical lymphadenopathy  MUSCULOSKELETAL:  The right lower extremity has pain with any range of motion he is neurovascularly intact skin is benign Other extremities are atraumatic with painless ROM and NVI.  Assessment: Right intertrochanteric hip fracture  Plan: I had an extensive conversation with the patient and his family. He has been seen by cardiology as well as the hospitalist. He is very high risk for surgery given his medical history. Unfortunately with his fracture he would be unlikely to be able to ambulate again without fixation of this fracture. In light of this he strongly desires to undergo surgery he understands that he is very high risk that he could suffer severe cardiac  event possibly death undergoing this surgery in light of that he would still like to go forward with surgical intervention.  Plan for IM nail of the hip on 3/3 tomorrow am at 7:30   Renette Butters, MD Cell [phone]   04/17/2016 7:01 PM

## 2016-04-17 NOTE — Care Management Important Message (Signed)
Important Message  Patient Details  Name: Connor Diaz MRN: [id number] Date of Birth: Aug 26, 1923   Medicare Important Message Given:  Yes    Dorena Bodo 04/17/2016, 3:28 PM

## 2016-04-17 NOTE — Progress Notes (Signed)
Spoke with Dr Isidoro Donning regarding pt b/p. Rec'd order for 500 ml bolus over an hour then resume fluids of NS at 75 /hr. Recheck b/p, if systolic less than 90 notify MD on call and move pt to step-down. On-coming nurse apprised of this information in shift report. Coreg discontinued. Portable CT reveals ileus, clear diet ordered. Pt will be NPO midnight tonight for planned surgery in the AM by Dr. Wandra Feinstein. Family at bedside.

## 2016-04-17 NOTE — Progress Notes (Addendum)
[facility]                                                                              Patient Demographics  Connor Diaz, is a 83 y.o. male, DOB - 1923-05-20, ZOX:[id number]  Admit date - 04/14/2016   Admitting Physician Jonah Blue, MD  Outpatient Primary MD for the patient is [name], MD  Outpatient specialists:   LOS - 3  days    Chief Complaint  Patient presents with  . Dizziness  . Fall       Brief summary   Patient is a 83 year old male with medical history significant of AAA; afib, on Eliquis; BPH; CKD stage 3; HTN; hypothyroidism; bradycardia s/p pacemaker placement; and CHF (EF 20-25% in 8/17) who originally presented to [facility] with right hip fracture on 04/14/16 after a mechanical fall. Patient was seen by orthopedics, Dr. Romeo Apple however due to his significant cardiac history, cardiologist (Dr [name]) and other medical issues, Dr. Romeo Apple recommended transfer to [facility] for orthopedics surgery and medical management.   Assessment & Plan    Principal Problem:  Right Hip fracture (HCC) - Patient transferred from [facility] hospital due to significant cardiac history - 2-D echo 2/28 showed EF of 25-30% with diffuse hypokinesis in the basal and mid inferolateral myocardium - I have consulted cardiology, discussed with Dr. [name] for cardiac clearance - Also has a history of abdominal aortic aneurysm, abdominal ultrasound done - Orthopedics consult obtained, discussed with Dr. Renaye Rakers Addendum 6:20PM  BP somewhat low in 70s, asymptomatic, hold Coreg Placed on IV fluids Abdominal x-ray with ileus and large stool burden, placed on scheduled Senokot as and MiraLAX, change to full liquid diet, explained abdominal x-ray findings to patient's wife Surgery planned for tomorrow morning, NPO after midnight Discussed plan of care with patient's RN, if BP persistently low, will move to stepdown unit. If patient  develops any sudden deterioration in clinical status, persistent hypotension, abdominal pain, we will need to obtain CT abdomen to rule out any spontaneous rupture of his AAA. - I have discussed all the above with patient's wife in detail    Active Problems:   AAA/ Aneurysm of abdominal vessel (HCC) - Ultrasound abdominal done, showed Interval growth of an infrarenal abdominal aortic aneurysm which measures 6.7 cm AP x 7.2 cm transversely. It is 9.2 cm in length and extends into the left common iliac artery.  - Patient was seen on 05/22/15 in [facility], seen by Dr Pattricia Boss, at that time AAA measured 6.1 x 6.2 cm, patient was recommended to follow up. Another note on 06/24/15 stated that he was not interested in moving forward with surgery due to potential impact on his quality of life - I have explained the current measurements of the AAA from yesterday's ultrasound to the patient's wife in detail and this puts him at very high risk for the surgery.    Non-ischemic cardiomyopathy (HCC), Paroxysmal atrial fibrillation (HCC) - Rate controlled, with pacer, on amiodarone - Currently, eliquis on hold, CHADvasc score 5    Abdominal distention  - Per patient had a bowel movement this  morning - Obtain portable KUB to rule out ileus due to narcotics   Acute on CKD (chronic kidney disease) stage 3, GFR 30-59 ml/min - Baseline creatinine ~1.7 - Per family, has not been eating well in the last few days - Currently no volume overload, appears to be dehydrated and was having diarrhea in the last few days - Place on gentle hydration for 1 L only   Code Status: DNR  DVT Prophylaxis:  Lovenox  Family Communication: Discussed in detail with the patient, all imaging results, lab results explained to the patient, son, wife, other family members in the room   Disposition Plan:   Time Spent in minutes   25 minutes  Procedures:  2-D echo  Consultants:   Cardiology Orthopedics  Antimicrobials:       Medications  Scheduled Meds: . amiodarone  100 mg Oral Daily  . atorvastatin  40 mg Oral q1800  . carvedilol  6.25 mg Oral BID WC  . enoxaparin (LOVENOX) injection  80 mg Subcutaneous Q24H  . famotidine  20 mg Oral QPM  . feeding supplement  1 Container Oral TID BM  . finasteride  5 mg Oral QPM  . levothyroxine  75 mcg Oral QAC breakfast  . multivitamin with minerals  1 tablet Oral Daily   Continuous Infusions: . sodium chloride     PRN Meds:.HYDROcodone-acetaminophen, methocarbamol **OR** methocarbamol (ROBAXIN)  IV, morphine injection, ondansetron (ZOFRAN) IV, ondansetron, polyethylene glycol   Antibiotics   Anti-infectives    None        Subjective:   Purnell Daigle was seen and examined today.Continues to complain of right hip pain, 8 out of 10, feels miserable. No chest pain or shortness of breath. Had a BM earlier this morning however now feels abdominal bloating. No nausea or vomiting.  Patient denies dizziness,  new weakness, numbess, tingling. No acute events overnight.    Objective:   Vitals:   04/15/16 2154 04/16/16 1422 04/16/16 2004 04/17/16 0412  BP: 120/71 102/69 (!) 96/57 108/61  Pulse: 68 77 72 77  Resp: 18 16 16 18   Temp: 98 F (36.7 C) 97.6 F (36.4 C) 98.3 F (36.8 C) 97.9 F (36.6 C)  TempSrc: Oral Oral Oral Oral  SpO2: 94% 93% 100% 96%  Weight:      Height:        Intake/Output Summary (Last 24 hours) at 04/17/16 1208 Last data filed at 04/17/16 0857  Gross per 24 hour  Intake              560 ml  Output              650 ml  Net              -90 ml     Wt Readings from Last 3 Encounters:  04/14/16 78.3 kg (172 lb 9.9 oz)  04/14/16 79 kg (174 lb 1.6 oz)  10/23/15 68 kg (150 lb)     Exam  General: Alert and oriented x 3, NAD  HEENT:    Neck: Supple, no JVD, no masses  Cardiovascular: S1 S2 auscultated, no rubs, murmurs or gallops. Regular rate and rhythm.  Respiratory: Clear to auscultation bilaterally, no  wheezing, rales or rhonchi  Gastrointestinal: Soft, nontender, Mildly distended , + bowel sounds  Ext: no cyanosis clubbing or edema  Neuro:no new deficits   Skin: No rashes  Psych: Normal affect and demeanor, alert and oriented x3    Data Reviewed:  I have personally  reviewed following labs and imaging studies  Micro Results No results found for this or any previous visit (from the past 240 hour(s)).  Radiology Reports Dg Chest 1 View  Result Date: 04/14/2016 CLINICAL DATA:  Dizziness.  Fall. EXAM: CHEST 1 VIEW FINDINGS: Cardiac pacer with lead tips over the right atrium right ventricle. Cardiomegaly. Mild pulmonary vascular prominence with mild interstitial prominence. Mild CHF cannot be excluded. Underlying chronic interstitial lung disease most likely present. Small right pleural effusion cannot be excluded. Left costophrenic angle not imaged. No pneumothorax. No acute bony abnormalities. Degenerative changes both shoulders. IMPRESSION: 1. Cardiac pacer stable position. Cardiomegaly with mild bilateral from interstitial prominence suggesting mild CHF. Tiny right pleural effusion cannot be excluded. 2. Underlying chronic interstitial lung disease most likely present . Electronically Signed   By: Maisie Fus  Register   On: 04/14/2016 17:00   Dg Knee 1-2 Views Right  Result Date: 04/14/2016 CLINICAL DATA:  Fall. EXAM: RIGHT KNEE - 1-2 VIEW COMPARISON:  04/14/2016. FINDINGS: Soft tissue swelling. Small effusion cannot be excluded. Severe tricompartment degenerative change in osteopenia. Subtle fracture, age undetermined, of the superior aspect of patella cannot be excluded. Bipartite patella could also present in this fashion. Peripheral vascular calcification . IMPRESSION: 1. Soft tissue swelling. Small effusion cannot be excluded. Subtle fracture, age undetermined, superior aspect of the patella. Bipartite patella could also present in this fashion. 2. Diffuse osteopenia and severe  degenerative change. 3. Peripheral vascular disease. Electronically Signed   By: Maisie Fus  Register   On: 04/14/2016 17:02   US Aorta  Result Date: 04/15/2016 CLINICAL DATA:  Follow-up of an abdominal aortic aneurysm measuring 5.6 x 5.7 cm seen on CT angiogram of the abdomen pelvis of October 24, 2014 EXAM: ULTRASOUND OF ABDOMINAL AORTA TECHNIQUE: Ultrasound examination of the abdominal aorta was performed to evaluate for abdominal aortic aneurysm. COMPARISON:  CT angiogram of October 24, 2014 FINDINGS: Abdominal Aorta The proximal and mid aorta are obscured by bowel gas. There is an infrarenal abdominal aortic aneurysm measuring 6.7 cm AP x 7.2 cm transversely. There is considerable mural plaque and thrombus. The luminal diameter is approximately 3.5 cm. The aneurysm is 9.2 cm in length. The right common iliac artery measures 1.6 x 1.8 cm. The left common iliac artery measures 2.1 x 1.7 cm. Maximum Diameter: 7.2 cm in the transverse plane. IMPRESSION: Interval growth of an infrarenal abdominal aortic aneurysm which measures 6.7 cm AP x 7.2 cm transversely. It is 9.2 cm in length and extends into the left common iliac artery. The proximal and mid aorta could not be imaged due to bowel gas and the patient's clinical condition which limited changes in position. These results will be called to the ordering clinician or representative by the Radiologist Assistant, and communication documented in the PACS or zVision Dashboard. Electronically Signed   By: David  Swaziland M.D.   On: 04/15/2016 13:42   Dg Hip Unilat W Or Wo Pelvis 2-3 Views Right  Result Date: 04/14/2016 CLINICAL DATA:  Fall today with right hip pain and deformity EXAM: DG HIP (WITH OR WITHOUT PELVIS) 2-3V RIGHT COMPARISON:  None. FINDINGS: Bones are diffusely demineralized. Comminuted intertrochanteric right femoral neck fracture noted with varus angulation. Lesser trochanter exists as a free fragment. SI joints and symphysis pubis unremarkable.  Distal aortic and iliac artery atherosclerosis evident. IMPRESSION: 1. Comminuted intertrochanteric right femoral neck fracture with varus angulation. 2.  Abdominal Aortic Atherosclerois (ICD10-170.0) Electronically Signed   By: Kennith Center M.D.   On: 04/14/2016 17:01  Lab Data:  CBC:  Recent Labs Lab 04/14/16 1609 04/15/16 0550  WBC 8.3 9.5  NEUTROABS 4.5  --   HGB 12.5* 10.6*  HCT 36.5* 31.5*  MCV 99.7 100.6*  PLT 152 137*   Basic Metabolic Panel:  Recent Labs Lab 04/14/16 1609 04/15/16 0550 04/16/16 0613  NA 135 135 136  K 4.8 5.3* 5.6*  CL 104 106 104  CO2 23 24 23   GLUCOSE 120* 110* 123*  BUN 37* 38* 45*  CREATININE 1.74* 1.79* 2.26*  CALCIUM 8.9 8.3* 8.8*   GFR: Estimated Creatinine Clearance: 22.4 mL/min (by C-G formula based on SCr of 2.26 mg/dL (H)). Liver Function Tests:  Recent Labs Lab 04/14/16 1609  AST 18  ALT 11*  ALKPHOS 44  BILITOT 0.6  PROT 6.8  ALBUMIN 3.6   No results for input(s): LIPASE, AMYLASE in the last 168 hours. No results for input(s): AMMONIA in the last 168 hours. Coagulation Profile: No results for input(s): INR, PROTIME in the last 168 hours. Cardiac Enzymes: No results for input(s): CKTOTAL, CKMB, CKMBINDEX, TROPONINI in the last 168 hours. BNP (last 3 results) No results for input(s): PROBNP in the last 8760 hours. HbA1C: No results for input(s): HGBA1C in the last 72 hours. CBG:  Recent Labs Lab 04/14/16 1604  GLUCAP 117*   Lipid Profile: No results for input(s): CHOL, HDL, LDLCALC, TRIG, CHOLHDL, LDLDIRECT in the last 72 hours. Thyroid Function Tests: No results for input(s): TSH, T4TOTAL, FREET4, T3FREE, THYROIDAB in the last 72 hours. Anemia Panel: No results for input(s): VITAMINB12, FOLATE, FERRITIN, TIBC, IRON, RETICCTPCT in the last 72 hours. Urine analysis: No results found for: COLORURINE, APPEARANCEUR, LABSPEC, PHURINE, GLUCOSEU, HGBUR, BILIRUBINUR, KETONESUR, PROTEINUR, UROBILINOGEN, NITRITE,  Hurshel Party M.D. [facility] 04/17/2016, 12:08 PM  Pager: [phone] Between 7am to 7pm - call Pager - [phone]  After 7pm go to [email or web address] - password [email or web address]  Call night coverage person covering after 7pm

## 2016-04-17 NOTE — Consult Note (Signed)
Reason for Consult:preop cardiac clearance for right hip surgery Referring Physician:[facility]  KERIM STATZER is an 83 y.o. male.  [id number] is 83 year old male with past medical history significant for multiple medical problems, I.e.multivessel coronary artery disease, history of silent inferior wall MI in the past, status post PTCA stenting to LAD/left circumflex/obtuse marginal 3 in November 2013, ischemic cardiomyopathy, history of recurrent congestive heart failure secondary to depressed LV systolic function, chronic atrial fibrillation1.  Chronic anticoagulation, history of tachybradycardia syndrome status post permanent pacemaker in the past, progressive enlarging abdominal aortic aneurysm, hypertension, hyperlipidemia, chronic kidney disease stage 3, hypothyroidism,had a fall on 04/14/2016, sustaining right femoral neck fracture.  Patient was transferred from [facility], right hip surgery and medical management.  Patient presently awake, denies any chest pain or shortness of breath.  Complains of right hip pain.  Denies any chest pain, palpitations prior to the fall.  Denies any history of exertional chest pain.  Although her activity is very limited.  Past Medical History:  Diagnosis Date  . AAA (abdominal aortic aneurysm) ([place])    a. 02/2009 U/S 4.4 x 4.5 cm  . BPH (benign prostatic hyperplasia)   . CKD (chronic kidney disease), stage III   . Coronary artery disease   . DJD (degenerative joint disease)   . History of tobacco abuse    a. roughly 36 pack years, quit @ age 72.  Marland Kitchen Hypertension   . Hypothyroidism   . Non-ischemic cardiomyopathy ([place])   . PAF (paroxysmal atrial fibrillation) ([place])    a. noted 11/2011  . Pancreatitis    a. 10/2011 - managed conservatively @ home.  . Symptomatic bradycardia    a. s/p STJ dual chamber PPM   . Systolic heart failure ([place])    02/2013    Past Surgical History:  Procedure Laterality Date  . CATARACT EXTRACTION W/  INTRAOCULAR LENS  IMPLANT, BILATERAL Bilateral 1980's  . CORONARY ANGIOGRAM  11/19/2011   Procedure: CORONARY ANGIOGRAM;  Surgeon: Clent Demark, MD;  Location: [facility] CATH LAB;  Service: Cardiovascular;;  . CORONARY ANGIOPLASTY WITH STENT PLACEMENT  12/22/2011    LAD & CIRCUMFLEX  . EYE SURGERY    . PERCUTANEOUS CORONARY STENT INTERVENTION (PCI-S) N/A 12/22/2011   Procedure: PERCUTANEOUS CORONARY STENT INTERVENTION (PCI-S);  Surgeon: Clent Demark, MD;  Location: [facility] CATH LAB;  Service: Cardiovascular;  Laterality: N/A;  . PERMANENT PACEMAKER INSERTION N/A 11/20/2011   STJ dual chamber PPM implanted by Dr Rayann Heman for symptomatic bradycardia   . TONSILLECTOMY  ~ 1933    Family History  Problem Relation Age of Onset  . Other Father     Accidental death @ age 39 - box fell on him at work  . Cancer Mother     died @ 60 Bladder cancer    Social History:  reports that he quit smoking about 35 years ago. His smoking use included Cigarettes. He has a 35.00 pack-year smoking history. He has never used smokeless tobacco. He reports that he does not drink alcohol or use drugs.  Allergies:  Allergies  Allergen Reactions  . Iodinated Diagnostic Agents Rash and Swelling    Swelling in face, urticaria. Received Isovoc 370 (non-ionic) on 10/24/14.  . Isovue [Iopamidol] Itching and Swelling    Pt had slight erythema, itching and facial swelling right ear and right lower chin.  Pt sneezed several times after contrast injection.  Pt was monitored for 1 hr post injection and was given water to drink. Dr Alvester Chou  feels he had a very mild reaction.  Premeds are warranted in this patient.  Thanks, J Bohm    Medications: I have reviewed the patient's current medications.  Results for orders placed or performed during the hospital encounter of 04/14/16 (from the past 48 hour(s))  Basic metabolic panel     Status: Abnormal   Collection Time: 04/16/16  6:13 AM  Result Value Ref Range   Sodium 136 135 - 145  mmol/L   Potassium 5.6 (H) 3.5 - 5.1 mmol/L   Chloride 104 101 - 111 mmol/L   CO2 23 22 - 32 mmol/L   Glucose, Bld 123 (H) 65 - 99 mg/dL   BUN 45 (H) 6 - 20 mg/dL   Creatinine, Ser 7.65 (H) 0.61 - 1.24 mg/dL   Calcium 8.8 (L) 8.9 - 10.3 mg/dL   GFR calc non Af Amer 24 (L) >60 mL/min   GFR calc Af Amer 28 (L) >60 mL/min    Comment: (NOTE) The eGFR has been calculated using the CKD EPI equation. This calculation has not been validated in all clinical situations. eGFR's persistently <60 mL/min signify possible Chronic Kidney Disease.    Anion gap 9 5 - 15    US Aorta  Result Date: 04/15/2016 CLINICAL DATA:  Follow-up of an abdominal aortic aneurysm measuring 5.6 x 5.7 cm seen on CT angiogram of the abdomen pelvis of October 24, 2014 EXAM: ULTRASOUND OF ABDOMINAL AORTA TECHNIQUE: Ultrasound examination of the abdominal aorta was performed to evaluate for abdominal aortic aneurysm. COMPARISON:  CT angiogram of October 24, 2014 FINDINGS: Abdominal Aorta The proximal and mid aorta are obscured by bowel gas. There is an infrarenal abdominal aortic aneurysm measuring 6.7 cm AP x 7.2 cm transversely. There is considerable mural plaque and thrombus. The luminal diameter is approximately 3.5 cm. The aneurysm is 9.2 cm in length. The right common iliac artery measures 1.6 x 1.8 cm. The left common iliac artery measures 2.1 x 1.7 cm. Maximum Diameter: 7.2 cm in the transverse plane. IMPRESSION: Interval growth of an infrarenal abdominal aortic aneurysm which measures 6.7 cm AP x 7.2 cm transversely. It is 9.2 cm in length and extends into the left common iliac artery. The proximal and mid aorta could not be imaged due to bowel gas and the patient's clinical condition which limited changes in position. These results will be called to the ordering clinician or representative by the Radiologist Assistant, and communication documented in the PACS or zVision Dashboard. Electronically Signed   By: David  [place]aziland  M.D.   On: 04/15/2016 13:42   Dg Abd Portable 2v  Result Date: 04/17/2016 CLINICAL DATA:  Abdominal distention. EXAM: PORTABLE ABDOMEN - 2 VIEW COMPARISON:  CT abdomen and pelvis 10/24/2014. FINDINGS: No free intraperitoneal air is identified. There is mild gaseous distention of small and large bowel diffusely. Prominent stool burden ascending colon noted. IMPRESSION: Bowel gas pattern most in keeping with ileus. Large stool burden ascending colon. Electronically Signed   By: Drusilla Kanner M.D.   On: 04/17/2016 12:11    Review of Systems  Constitutional: Negative for chills and fever.  Respiratory: Negative for cough and shortness of breath.   Cardiovascular: Negative for chest pain, palpitations, orthopnea and leg swelling.  Gastrointestinal: Negative for abdominal pain, nausea and vomiting.  Musculoskeletal: Positive for joint pain.   Blood pressure 108/61, pulse 77, temperature 97.9 F (36.6 C), temperature source Oral, resp. rate 18, height 5\' 10"  (1.778 m), weight 172 lb 9.9 oz (78.3 kg), SpO2  96 %. Physical Exam  Constitutional: He is oriented to person, place, and time.  Eyes: Conjunctivae are normal. Left eye exhibits no discharge. No scleral icterus.  Neck: Neck supple. No JVD present. No tracheal deviation present. No thyromegaly present.  Cardiovascular: Normal rate and regular rhythm.   Murmur (soft systolic murmur and diastolic murmur noted) heard. Respiratory:  Clear to auscultation anterolaterally  GI: Soft. Bowel sounds are normal. He exhibits distension. There is no tenderness. There is no rebound.  Musculoskeletal: He exhibits no edema or tenderness.  Neurological: He is alert and oriented to person, place, and time.    Assessment/Plan: Comminuted right trochanter and trochanteric femoral neck fracture, status post fall Multivessel coronary artery disease, history of silent inferior wall MI and remote past, status post multivessel PCI to LAD, left circumflex and  obtuse marginal 3 in November 2013. Ischemic cardiomyopathy. Tachybradycardia syndrome status post permanent pacemaker in the past History of congestive heart failure secondary to depressed LV systolic function. Critical abdominal aortic aneurysm.being followed at [facility] Chronic atrial fibrillation chads vasc score of 5 on chronic anticoagulation Acute on chronic kidney disease stage III. Hypertension. Hyperlipidemia. Hypothyroidism. Degenerative joint disease. Plan Agree with present management  and  Holding ACE inhibitor in view of worsening renal function Patient very high risk for any surgical procedure in view of multiple comorbidities, I.e., ischemic cardiomyopathy, recurrent congestive heart failure, multivessel CAD and critical abdominal aortic aneurysm with worsening renal function and advanced age.  Discussed with patient and family .   Charolette Forward 04/17/2016, 12:35 PM

## 2016-04-17 NOTE — Progress Notes (Signed)
Family of pt have been very inquisitive and impatient for information regarding impending surgery date and time for pt. RN has explained to all family members present on numerous occasions this shift that it is a due process to follow for consults and the consulting physician needs to review the case. Assured family that Dr. Tana Coast would see that all pt needs are met and ortho consulted to see pt.  Again at this time family is inquiring about speaking with Dr. Tana Coast stating that they have other ortho surgeons names they would like to pass on to her. RN paged Dr. Tana Coast with all family concerns and that they have ortho names to pass on to her. Dr. Tana Coast states that Dr. Alain Marion will be the consulting physician and will indeed see the pt this pm.  Time frames, wait times explained to family but they are not understanding why it is taking so long.  Dr. Tana Coast had previously spoken with family earlier in the day and advised them of Dr. Darene Lamer. Murphy's name for the consulting physician and no changes of physicians were mentioned at that time.  Dr. Tana Coast will call family in room as she completes her current pt care. This information will be presented to family.

## 2016-04-18 DIAGNOSIS — E440 Moderate protein-calorie malnutrition: Secondary | ICD-10-CM

## 2016-04-18 DIAGNOSIS — I714 Abdominal aortic aneurysm, without rupture: Secondary | ICD-10-CM

## 2016-04-18 DIAGNOSIS — N183 Chronic kidney disease, stage 3 (moderate): Secondary | ICD-10-CM

## 2016-04-18 LAB — COMPREHENSIVE METABOLIC PANEL
ALBUMIN: 2.7 g/dL — AB (ref 3.5–5.0)
ALT: 11 U/L — AB (ref 17–63)
AST: 33 U/L (ref 15–41)
Alkaline Phosphatase: 36 U/L — ABNORMAL LOW (ref 38–126)
Anion gap: 9 (ref 5–15)
BUN: 67 mg/dL — ABNORMAL HIGH (ref 6–20)
CHLORIDE: 105 mmol/L (ref 101–111)
CO2: 19 mmol/L — AB (ref 22–32)
CREATININE: 3.03 mg/dL — AB (ref 0.61–1.24)
Calcium: 8.1 mg/dL — ABNORMAL LOW (ref 8.9–10.3)
GFR calc Af Amer: 19 mL/min — ABNORMAL LOW (ref >60)
GFR, EST NON AFRICAN AMERICAN: 17 mL/min — AB (ref >60)
GLUCOSE: 127 mg/dL — AB (ref 65–99)
Potassium: 4.4 mmol/L (ref 3.5–5.1)
Sodium: 133 mmol/L — ABNORMAL LOW (ref 135–145)
Total Bilirubin: 0.8 mg/dL (ref 0.3–1.2)
Total Protein: 5.6 g/dL — ABNORMAL LOW (ref 6.5–8.1)

## 2016-04-18 LAB — URINALYSIS, COMPLETE (UACMP) WITH MICROSCOPIC
BILIRUBIN URINE: NEGATIVE
GLUCOSE, UA: NEGATIVE mg/dL
HGB URINE DIPSTICK: NEGATIVE
Ketones, ur: NEGATIVE mg/dL
LEUKOCYTES UA: NEGATIVE
Nitrite: NEGATIVE
Protein, ur: NEGATIVE mg/dL
Specific Gravity, Urine: 1.018 (ref 1.005–1.030)
pH: 5 (ref 5.0–8.0)

## 2016-04-18 LAB — CBC WITH DIFFERENTIAL/PLATELET
BASOS ABS: 0 10*3/uL (ref 0.0–0.1)
BASOS PCT: 0 %
EOS PCT: 1 %
Eosinophils Absolute: 0.1 10*3/uL (ref 0.0–0.7)
HCT: 28.5 % — ABNORMAL LOW (ref 39.0–52.0)
Hemoglobin: 9.5 g/dL — ABNORMAL LOW (ref 13.0–17.0)
LYMPHS PCT: 10 %
Lymphs Abs: 1.1 10*3/uL (ref 0.7–4.0)
MCH: 33.6 pg (ref 26.0–34.0)
MCHC: 33.3 g/dL (ref 30.0–36.0)
MCV: 100.7 fL — AB (ref 78.0–100.0)
Monocytes Absolute: 2.2 10*3/uL — ABNORMAL HIGH (ref 0.1–1.0)
Monocytes Relative: 20 %
NEUTROS ABS: 7.4 10*3/uL (ref 1.7–7.7)
Neutrophils Relative %: 69 %
PLATELETS: 149 10*3/uL — AB (ref 150–400)
RBC: 2.83 MIL/uL — AB (ref 4.22–5.81)
RDW: 15.1 % (ref 11.5–15.5)
WBC: 10.7 10*3/uL — AB (ref 4.0–10.5)

## 2016-04-18 LAB — SURGICAL PCR SCREEN
MRSA, PCR: NEGATIVE
Staphylococcus aureus: POSITIVE — AB

## 2016-04-18 LAB — HEPARIN LEVEL (UNFRACTIONATED): Heparin Unfractionated: 1.47 IU/mL — ABNORMAL HIGH (ref 0.30–0.70)

## 2016-04-18 MED ORDER — CHLORHEXIDINE GLUCONATE 4 % EX LIQD
60.0000 mL | Freq: Once | CUTANEOUS | Status: AC
  Administered 2016-04-19: 4 via TOPICAL
  Filled 2016-04-18: qty 60

## 2016-04-18 MED ORDER — CEFAZOLIN SODIUM-DEXTROSE 2-4 GM/100ML-% IV SOLN
2.0000 g | INTRAVENOUS | Status: AC
  Administered 2016-04-19: 2 g via INTRAVENOUS
  Filled 2016-04-18: qty 100

## 2016-04-18 MED ORDER — CHLORHEXIDINE GLUCONATE CLOTH 2 % EX PADS: 6.0000 | MEDICATED_PAD | Freq: Every day | CUTANEOUS | Status: DC

## 2016-04-18 MED ORDER — HEPARIN (PORCINE) IN NACL 100-0.45 UNIT/ML-% IJ SOLN
1000.0000 [IU]/h | INTRAMUSCULAR | Status: DC
  Administered 2016-04-18: 1000 [IU]/h via INTRAVENOUS
  Filled 2016-04-18: qty 250

## 2016-04-18 MED ORDER — CHLORHEXIDINE GLUCONATE CLOTH 2 % EX PADS
6.0000 | MEDICATED_PAD | Freq: Every day | CUTANEOUS | Status: AC
  Administered 2016-04-19 – 2016-04-22 (×4): 6 via TOPICAL

## 2016-04-18 MED ORDER — SUFENTANIL CITRATE 50 MCG/ML IV SOLN
INTRAVENOUS | Status: AC
  Filled 2016-04-18: qty 1

## 2016-04-18 MED ORDER — POVIDONE-IODINE 10 % EX SWAB: 2.0000 "application " | Freq: Once | CUTANEOUS | Status: DC

## 2016-04-18 MED ORDER — MUPIROCIN 2 % EX OINT
1.0000 "application " | TOPICAL_OINTMENT | Freq: Two times a day (BID) | CUTANEOUS | Status: AC
  Administered 2016-04-18 – 2016-04-22 (×9): 1 via NASAL
  Filled 2016-04-18: qty 22

## 2016-04-18 MED ORDER — IPRATROPIUM-ALBUTEROL 0.5-2.5 (3) MG/3ML IN SOLN
3.0000 mL | Freq: Once | RESPIRATORY_TRACT | Status: AC
  Administered 2016-04-18: 3 mL via RESPIRATORY_TRACT
  Filled 2016-04-18: qty 3

## 2016-04-18 MED ORDER — PROPOFOL 10 MG/ML IV BOLUS
INTRAVENOUS | Status: AC
  Filled 2016-04-18: qty 20

## 2016-04-18 MED ORDER — MUPIROCIN 2 % EX OINT
1.0000 "application " | TOPICAL_OINTMENT | Freq: Two times a day (BID) | CUTANEOUS | Status: DC
  Filled 2016-04-18: qty 22

## 2016-04-18 MED ORDER — HEPARIN (PORCINE) IN NACL 100-0.45 UNIT/ML-% IJ SOLN: 700.0000 [IU]/h | INTRAMUSCULAR | Status: DC

## 2016-04-18 NOTE — Progress Notes (Signed)
Spoke with Orthopedic MD, Eulah Pont and was informed that pt's surgery will be postponed at this time. After speaking with on call MD, Robb Matar, the plan for the day will be to hydrate pt and monitor renal function. Family made aware of plan at this time and verbalized understanding. Dr. Eulah Pont to round on pt later today. No orders received. Will continue to monitor patient at this time.

## 2016-04-18 NOTE — Consult Note (Signed)
Referring Provider: No ref. provider found Primary Care Physician:  [name], MD Primary Nephrologist:     Reason for Consultation:  Acute on chronic renal failure  Hypotension  S/p hip fracture   HPI:  Connor Diaz  is an 83 year old male with a past medical history of AAA, A. fib on Eliquis, BPH, hypertension, hypothyroidism, chronic kidney disease who was transferred from [facility] due to right hip fracture. He  was seen because of hypotension and confusion earlier. Per family member, he is close to baseline MS now, but per daughter and wife his alertness was decreased due to analgesics while [employer]. They state, that Connor Diaz did not eat or drink fluids for almost 2 days due to his drowsiness. He also had decreased urine output as well. Earlier during Connor shift, he had hypotension that responded to 3 different normal saline boluses of 500 mL.   Creatinine  1.5 baseline   Some hemodynamic instability with BP < 80  Mm Hg    Urine output non oliguric  Bland urinalysis  U/S aorta  7 cm   EF 25 % No NSAIDS   ACE or ARB medications   Past Medical History:  Diagnosis Date  . AAA (abdominal aortic aneurysm) (HCC)    a. 02/2009 U/S 4.4 x 4.5 cm  . BPH (benign prostatic hyperplasia)   . CKD (chronic kidney disease), stage III   . Coronary artery disease   . DJD (degenerative joint disease)   . History of tobacco abuse    a. roughly 36 pack years, quit @ age 46.  Marland Kitchen Hypertension   . Hypothyroidism   . Non-ischemic cardiomyopathy (HCC)   . PAF (paroxysmal atrial fibrillation) (HCC)    a. noted 11/2011  . Pancreatitis    a. 10/2011 - managed conservatively @ home.  . Symptomatic bradycardia    a. s/p STJ dual chamber PPM   . Systolic heart failure (HCC)    02/2013    Past Surgical History:  Procedure Laterality Date  . CATARACT EXTRACTION W/ INTRAOCULAR LENS  IMPLANT, BILATERAL Bilateral 1980's  . CORONARY ANGIOGRAM  11/19/2011   Procedure: CORONARY ANGIOGRAM;   Surgeon: [name], MD;  Location: [facility] CATH LAB;  Service: Cardiovascular;;  . CORONARY ANGIOPLASTY WITH STENT PLACEMENT  12/22/2011    LAD & CIRCUMFLEX  . EYE SURGERY    . PERCUTANEOUS CORONARY STENT INTERVENTION (PCI-S) N/A 12/22/2011   Procedure: PERCUTANEOUS CORONARY STENT INTERVENTION (PCI-S);  Surgeon: [name], MD;  Location: [facility] CATH LAB;  Service: Cardiovascular;  Laterality: N/A;  . PERMANENT PACEMAKER INSERTION N/A 11/20/2011   STJ dual chamber PPM implanted by [name] for symptomatic bradycardia   . TONSILLECTOMY  ~ 1933    Prior to Admission medications   Medication Sig Start Date End Date Taking? Authorizing Provider  acetaminophen (TYLENOL) 500 MG tablet Take 500 mg by mouth daily.   Yes Historical Provider, MD  amiodarone (PACERONE) 200 MG tablet Take 100 mg by mouth daily.   Yes Historical Provider, MD  apixaban (ELIQUIS) 2.5 MG TABS tablet Take 2.5 mg by mouth 2 (two) times daily.   Yes Historical Provider, MD  atorvastatin (LIPITOR) 80 MG tablet Take 40 mg by mouth daily at 6 PM. 11/23/11  Yes Rinaldo Cloud, MD  carvedilol (COREG) 6.25 MG tablet TAKE 1 TABLET BY MOUTH TWICE DAILY WITH MEALS. 03/24/16  Yes Amber Caryl Bis, NP  cetirizine (ZYRTEC ALLERGY) 10 MG tablet Take 10 mg by mouth daily as  needed for allergies or rhinitis.    Yes Historical Provider, MD  famotidine (PEPCID) 20 MG tablet Take 20 mg by mouth every evening.  12/23/11  Yes Rinaldo Cloud, MD  finasteride (PROSCAR) 5 MG tablet Take 5 mg by mouth every evening.  02/08/12  Yes Historical Provider, MD  fish oil-omega-3 fatty acids 1000 MG capsule Take 1 g by mouth every morning.    Yes Historical Provider, MD  furosemide (LASIX) 40 MG tablet Take 1 tablet (40 mg total) by mouth daily. 02/25/13  Yes Pasty Spillers McLean-Scocuzza, MD  levothyroxine (SYNTHROID, LEVOTHROID) 75 MCG tablet Take 75 mcg by mouth daily before breakfast. 04/30/14  Yes Historical Provider, MD  linaclotide (LINZESS) 72 MCG capsule Take 72  mcg by mouth daily as needed (for constipation).   Yes Historical Provider, MD  Menthol, Topical Analgesic, (BLUE-EMU MAXIMUM STRENGTH EX) Apply 1 application topically daily. Applied to knees   Yes Historical Provider, MD  nitroGLYCERIN (NITROSTAT) 0.4 MG SL tablet Place 1 tablet (0.4 mg total) under Connor tongue every 5 (five) minutes x 3 doses as needed for chest pain. 11/23/11  Yes Rinaldo Cloud, MD  polyethylene glycol powder (GLYCOLAX/MIRALAX) powder Take 17 g by mouth daily as needed for moderate constipation.  10/27/11  Yes Historical Provider, MD  ramipril (ALTACE) 10 MG capsule TAKE TWO CAPSULES (20 MG) BY MOUTH ONCE DAILY 08/26/15  Yes Hillis Range, MD    Current Facility-Administered Medications  Medication Dose Route Frequency Provider Last Rate Last Dose  . 0.9 %  sodium chloride infusion   Intravenous Continuous Ripudeep Jenna Luo, MD 75 mL/hr at 04/18/16 0514    . amiodarone (PACERONE) tablet 100 mg  100 mg Oral Daily Jonah Blue, MD   100 mg at 04/18/16 9563  . atorvastatin (LIPITOR) tablet 40 mg  40 mg Oral q1800 Jonah Blue, MD   40 mg at 04/17/16 1714  . Chlorhexidine Gluconate Cloth 2 % PADS 6 each  6 each Topical Daily Ripudeep K Rai, MD      . Chlorhexidine Gluconate Cloth 2 % PADS 6 each  6 each Topical Daily Ripudeep K Rai, MD      . famotidine (PEPCID) tablet 20 mg  20 mg Oral QPM Jonah Blue, MD   20 mg at 04/17/16 1714  . feeding supplement (BOOST / RESOURCE BREEZE) liquid 1 Container  1 Container Oral TID BM Ripudeep Jenna Luo, MD   1 Container at 04/17/16 1400  . finasteride (PROSCAR) tablet 5 mg  5 mg Oral QPM Jonah Blue, MD   5 mg at 04/17/16 1714  . heparin ADULT infusion 100 units/mL (25000 units/214mL sodium chloride 0.45%)  1,000 Units/hr Intravenous Continuous Gwenlyn Found Carney, RPH      . HYDROcodone-acetaminophen (NORCO/VICODIN) 5-325 MG per tablet 1-2 tablet  1-2 tablet Oral Q4H PRN Jonah Blue, MD   2 tablet at 04/17/16 0425  . levothyroxine (SYNTHROID,  LEVOTHROID) tablet 75 mcg  75 mcg Oral QAC breakfast Jonah Blue, MD   75 mcg at 04/18/16 0825  . methocarbamol (ROBAXIN) tablet 500 mg  500 mg Oral Q6H PRN Jonah Blue, MD   500 mg at 04/16/16 8756   Or  . methocarbamol (ROBAXIN) 500 mg in dextrose 5 % 50 mL IVPB  500 mg Intravenous Q6H PRN Jonah Blue, MD      . morphine 2 MG/ML injection 2 mg  2 mg Intravenous Q2H PRN Jonah Blue, MD   2 mg at 04/18/16 4332  . multivitamin with minerals tablet  1 tablet  1 tablet Oral Daily Oval Linsey, MD   1 tablet at 04/18/16 [phone]  . mupirocin ointment (BACTROBAN) 2 % 1 application  1 application Nasal BID Ripudeep K Rai, MD      . mupirocin ointment (BACTROBAN) 2 % 1 application  1 application Nasal BID Ripudeep Jenna Luo, MD   1 application at 04/18/16 [phone]  . ondansetron (ZOFRAN) injection 4 mg  4 mg Intravenous Q4H PRN Oval Linsey, MD      . ondansetron [facility]) tablet 4 mg  4 mg Oral Q4H PRN Oval Linsey, MD   4 mg at 04/17/16 0954  . polyethylene glycol (MIRALAX / GLYCOLAX) packet 17 g  17 g Oral Daily PRN Jonah Blue, MD      . senna-docusate (Senokot-S) tablet 1 tablet  1 tablet Oral BID Ripudeep K Rai, MD      . sodium chloride 0.9 % bolus 500 mL  500 mL Intravenous Once Jinger Neighbors, NP        Allergies as of 04/14/2016 - Review Complete 04/14/2016  Allergen Reaction Noted  . Iodinated diagnostic agents Rash and Swelling 06/19/2015  . Isovue [iopamidol] Itching and Swelling 10/24/2014    Family History  Problem Relation Age of Onset  . Other Father     Accidental death @ age 50 - box fell on him at work  . Cancer Mother     died @ 46 Bladder cancer    Social History   Social History  . Marital status: Married    Spouse name: N/A  . Number of children: N/A  . Years of education: N/A   Occupational History  . retired    Social History Main Topics  . Smoking status: Former Smoker    Packs/day: 1.00    Years: 35.00    Types: Cigarettes    Quit date:  12/17/1980  . Smokeless tobacco: Never Used     Comment: Quit at age 75.  Marland Kitchen Alcohol use No  . Drug use: No  . Sexual activity: Not Currently   Other Topics Concern  . Not on file   Social History Narrative   Pt lives in [place] with his wife.  He is retired from [employer].  He is relatively active @ home.    Review of Systems: Gen: Denies any fever, chills, sweats, anorexia, fatigue, weakness, malaise, weight loss, and sleep disorder HEENT: No visual complaints, No history of Retinopathy. Normal external appearance No Epistaxis or Sore throat. No sinusitis.   CV: Denies chest pain, angina, palpitations, syncope, orthopnea, PND, peripheral edema, and claudication. Resp: Denies dyspnea at rest, dyspnea with exercise, cough, sputum, wheezing, coughing up blood, and pleurisy. GI: Denies vomiting blood, jaundice, and fecal incontinence.   Denies dysphagia or odynophagia. GU : Denies urinary burning, blood in urine, urinary frequency, urinary hesitancy, nocturnal urination, and urinary incontinence.  No renal calculi. MS: Denies joint pain, limitation of movement, and swelling, stiffness, low back pain, extremity pain. Denies muscle weakness, cramps, atrophy.  No use of non steroidal antiinflammatory drugs. Derm: Denies rash, itching, dry skin, hives, moles, warts, or unhealing ulcers.  Psych: Denies depression, anxiety, memory loss, suicidal ideation, hallucinations, paranoia, and confusion. Heme: Denies bruising, bleeding, and enlarged lymph nodes. Neuro: No headache.  No diplopia. No dysarthria.  No dysphasia.  No history of CVA.  No Seizures. No paresthesias.  No weakness. Endocrine No DM.  No Thyroid disease.  No Adrenal disease.  Physical Exam: Vital signs in last  24 hours: Temp:  [97.7 F (36.5 C)-99.4 F (37.4 C)] 97.7 F (36.5 C) (03/03 0823) Pulse Rate:  [69-91] 91 (03/03 0800) Resp:  [14-19] 16 (03/03 0800) BP: (69-113)/(43-87) 98/57 (03/03 0800) SpO2:  [95  %-100 %] 100 % (03/03 0800) Weight:  [179 lb 12.8 oz (81.6 kg)] 179 lb 12.8 oz (81.6 kg) (03/03 0021) Last BM Date: 04/17/16 General:   Alert elderly man   In pain  Head:  Normocephalic and atraumatic. Eyes:  Sclera clear, no icterus.   Conjunctiva pink. Ears:  Normal auditory acuity. Nose:  No deformity, discharge,  or lesions. Mouth:  No deformity or lesions, dentition normal. Neck:  Supple; no masses or thyromegaly. JVP not elevated Lungs:  Clear throughout to auscultation.   No wheezes, crackles, or rhonchi. No acute distress. Heart:  Regular rate and rhythm; no murmurs, clicks, rubs,  or gallops. Abdomen:  Soft, nontender and nondistended. No masses, hepatosplenomegaly or hernias noted. Normal bowel sounds, without guarding, and without rebound.   Msk:   Right hip displaced  Pulses:  No carotid, renal, femoral bruits. DP and PT symmetrical and equal Extremities:  Without clubbing or edema. Neurologic:  Alert and  oriented x4;  grossly normal neurologically. Skin:  Intact without significant lesions or rashes. Cervical Nodes:  No significant cervical adenopathy.   Intake/Output from previous day: 03/02 0701 - 03/03 0700 In: 287.5 [P.O.:200; I.V.:87.5] Out: 275 [Urine:275] Intake/Output this shift: No intake/output data recorded.  Lab Results:  Recent Labs  04/18/16 0029  WBC 10.7*  HGB 9.5*  HCT 28.5*  PLT 149*   BMET  Recent Labs  04/16/16 0613 04/18/16 0029  NA 136 133*  K 5.6* 4.4  CL 104 105  CO2 23 19*  GLUCOSE 123* 127*  BUN 45* 67*  CREATININE 2.26* 3.03*  CALCIUM 8.8* 8.1*   LFT  Recent Labs  04/18/16 0029  PROT 5.6*  ALBUMIN 2.7*  AST 33  ALT 11*  ALKPHOS 36*  BILITOT 0.8   PT/INR No results for input(s): LABPROT, INR in Connor last 72 hours. Hepatitis Panel No results for input(s): HEPBSAG, HCVAB, HEPAIGM, HEPBIGM in Connor last 72 hours.  Studies/Results: Dg Chest Port 1 View  Result Date: 04/17/2016 CLINICAL DATA:  Acute onset of  respiratory distress. Initial encounter. EXAM: PORTABLE CHEST 1 VIEW COMPARISON:  Chest radiograph performed 04/14/2016 FINDINGS: Connor lungs are well-aerated. Bibasilar airspace opacities raise concern for pneumonia, though mild interstitial edema could have a similar appearance. No definite pleural effusion or pneumothorax is seen. Connor cardiomediastinal silhouette is mildly enlarged. A pacemaker is noted overlying Connor left chest wall, with leads ending overlying Connor right atrium and right ventricle. No acute osseous abnormalities are seen. IMPRESSION: 1. Bibasilar airspace opacities raise concern for pneumonia, though mild interstitial edema could have a similar appearance. 2. Mild cardiomegaly. Electronically Signed   By: Roanna Raider M.D.   On: 04/17/2016 23:46   Dg Abd Portable 2v  Result Date: 04/17/2016 CLINICAL DATA:  Abdominal distention. EXAM: PORTABLE ABDOMEN - 2 VIEW COMPARISON:  CT abdomen and pelvis 10/24/2014. FINDINGS: No free intraperitoneal air is identified. There is mild gaseous distention of small and large bowel diffusely. Prominent stool burden ascending colon noted. IMPRESSION: Bowel gas pattern most in keeping with ileus. Large stool burden ascending colon. Electronically Signed   By: Drusilla Kanner M.D.   On: 04/17/2016 12:11    Assessment/Plan:  Acute on chronic renal failure in setting of hypotension and Rt Hip fracture  - antihypertensives have  been stopped  He is non oliguric and responding to IV fluids. This appears to be ischemic ATN with poor renal perfusion. I would be cautious with volume management as he may develop pulmonary edema , he does not want dialysis.  Would increase his IV fluids to 100cc/hr and monitor for signs of pulmonary congestion   DNR status : very challenging situation as Diaz states that he would not want to live being unable to walk again. There is high risk of needing dialysis post op . Connor Diaz is clear in front of wife and step son  He  does not want dialysis   LOS: 4 Salvator Seppala W @TODAY @9 :41 AM

## 2016-04-18 NOTE — Progress Notes (Signed)
ANTICOAGULATION CONSULT NOTE - Initial Consult  Pharmacy Consult for heparin Indication: atrial fibrillation  Allergies  Allergen Reactions  . Iodinated Diagnostic Agents Rash and Swelling    Swelling in face, urticaria. Received Isovoc 370 (non-ionic) on 10/24/14.  . Isovue [Iopamidol] Itching and Swelling    Pt had slight erythema, itching and facial swelling right ear and right lower chin.  Pt sneezed several times after contrast injection.  Pt was monitored for 1 hr post injection and was given water to drink. Dr Gery Pray feels he had a very mild reaction.  Premeds are warranted in this patient.  Thanks, Gildardo Griffes    Patient Measurements: Height: 5\' 11"  (180.3 cm) Weight: 179 lb 12.8 oz (81.6 kg) IBW/kg (Calculated) : 75.3 Heparin Dosing Weight: 81.6 kg  Vital Signs: Temp: 97.7 F (36.5 C) (03/03 0823) Temp Source: Oral (03/03 0823) BP: 98/57 (03/03 0800) Pulse Rate: 91 (03/03 0800)  Labs:  Recent Labs  04/16/16 0613 04/18/16 0029  HGB  --  9.5*  HCT  --  28.5*  PLT  --  149*  CREATININE 2.26* 3.03*    Estimated Creatinine Clearance: 17.3 mL/min (by C-G formula based on SCr of 3.03 mg/dL (H)).   Medical History: Past Medical History:  Diagnosis Date  . AAA (abdominal aortic aneurysm) (HCC)    a. 02/2009 U/S 4.4 x 4.5 cm  . BPH (benign prostatic hyperplasia)   . CKD (chronic kidney disease), stage III   . Coronary artery disease   . DJD (degenerative joint disease)   . History of tobacco abuse    a. roughly 36 pack years, quit @ age 75.  Marland Kitchen Hypertension   . Hypothyroidism   . Non-ischemic cardiomyopathy (HCC)   . PAF (paroxysmal atrial fibrillation) (HCC)    a. noted 11/2011  . Pancreatitis    a. 10/2011 - managed conservatively @ home.  . Symptomatic bradycardia    a. s/p STJ dual chamber PPM   . Systolic heart failure (HCC)    02/2013    Medications:  Infusions:  . sodium chloride 75 mL/hr at 04/18/16 0514  . heparin      Assessment: 83 yo male on  chronic Eliquis for afib admitted with hip fracture.  Eliquis held in anticipation of OR soon.  Given full-dose Lovenox at [facility], however now with new renal dysfunction.  Pharmacy asked to start IV heparin since surgical plan still a bit unclear given worsening renal function.  Last Lovenox given 3/2 at 1230 PM.  Hgb with slight downward trend, but no overt bleeding noted.  Platelet count stable.  Goal of Therapy:  Heparin level 0.3-0.7 units/ml Monitor platelets by anticoagulation protocol: Yes   Plan:  -Start IV heparin at 1000 units/hr at 11 AM. -Check heparin level 8 hrs after gtt starts -Daily heparin level and CBC. -F/u plans to hold heparin pre-op.  Tad Moore, BCPS  Clinical Pharmacist Pager [phone]  04/18/2016 9:23 AM

## 2016-04-18 NOTE — Progress Notes (Signed)
Pt noted sleeping and NS bolus infusing

## 2016-04-18 NOTE — Progress Notes (Signed)
Patient ID: Connor Diaz, male   DOB: Sep 23, 1923, 83 y.o.   MRN: [id number]   Hypotension Resolved with normal saline boluses.  Acute on chronic renal failure Urine analysis is still pending. Continue gentle IV hydration. Monitor input and output. Please consult nephrology in a.m.  Right Hip fracture Discussed with Dr. Eulah Pont. Given the increase acuity, Dr. Eulah Pont will postpone surgical intervention for now.   The patient  is an 83 year old male with a past medical history of AAA, A. fib on Eliquis, BPH, hypertension, hypothyroidism, chronic kidney disease who was transferred from [facility] due to right hip fracture. He  was seen because of hypotension and confusion earlier. Per family member, he is close to baseline MS now, but per daughter and wife his alertness was decreased due to analgesics while Connor Diaz. They state, that the patient did not eat or drink fluids for almost 2 days due to his drowsiness. He also had decreased urine output as well. Earlier during the shift, he had hypotension that responded to 3 different normal saline boluses of 500 mL. Per patient's family, he seems to be more alert now after IV fluids. However, labs drawn tonight show worsening renal function. Discussed with Dr. Eulah Pont who will postpone surgery, possibly for tomorrow morning.   General exam: Appears calm and comfortable  Respiratory system: Clear to auscultation. Respiratory effort normal. Cardiovascular system: S1 & S2 heard, RRR. No JVD, murmurs, rubs, gallops or clicks. No pedal edema. Gastrointestinal system: Abdomen is nondistended, soft and nontender. No organomegaly or masses felt. Normal bowel sounds heard. Central nervous system: Alert and oriented x2, partially oriented to time. No focal neurological deficits. Extremities: Tenderness on right hip area. Good distal capillary refill. Skin: Cerebellar ecchymosis area on upper extremities. Psychiatry:  awake, alert, oriented 2,  partially oriented to time.    Basic Metabolic Panel:  Recent Labs Lab 04/14/16 1609 04/15/16 0550 04/16/16 0613 04/18/16 0029  NA 135 135 136 133*  K 4.8 5.3* 5.6* 4.4  CL 104 106 104 105  CO2 23 24 23  19*  GLUCOSE 120* 110* 123* 127*  BUN 37* 38* 45* 67*  CREATININE 1.74* 1.79* 2.26* 3.03*  CALCIUM 8.9 8.3* 8.8* 8.1*   GFR: Estimated Creatinine Clearance: 17.3 mL/min (by C-G formula based on SCr of 3.03 mg/dL (H)). Liver Function Tests:  Recent Labs Lab 04/14/16 1609 04/18/16 0029  AST 18 33  ALT 11* 11*  ALKPHOS 44 36*  BILITOT 0.6 0.8  PROT 6.8 5.6*  ALBUMIN 3.6 2.7*   Scheduled Meds: . amiodarone  100 mg Oral Daily  . atorvastatin  40 mg Oral q1800  . famotidine  20 mg Oral QPM  . feeding supplement  1 Container Oral TID BM  . finasteride  5 mg Oral QPM  . levothyroxine  75 mcg Oral QAC breakfast  . multivitamin with minerals  1 tablet Oral Daily  . senna-docusate  1 tablet Oral BID  . sodium chloride  500 mL Intravenous Once   Continuous Infusions: . sodium chloride 75 mL/hr at 04/18/16 0150     LOS: 4 days   Bobette Mo, MD [facility] Pager [phone].  If 7PM-7AM, please contact night-coverage [email or web address] Password [email or web address] 04/18/2016, 2:28 AM

## 2016-04-18 NOTE — Progress Notes (Signed)
Pt BP 69/43 after NS 500 ml bolus. On Assessment, pt  Is alert and denies pain. No distress noted. CCMD notified nurse of 3 runs of V paced. Nurse notified on-call MD and inform MD that  Dr. Isidoro Donning wanted pt to be transferred to step down ICU if SBP <90 after Bolus is given. MD called Rapid since MD was not on site.

## 2016-04-18 NOTE — Progress Notes (Signed)
Pt with noted confusion upon transfer to 4N. Per rapid response he is at his baseline however there is no documentation of the pt being confused or having altered orientation. Pt oriented to self and place however is getting confused regarding birth date, current year, etc. MD on call notified.

## 2016-04-18 NOTE — Progress Notes (Signed)
Rapid response assessing patient . Patient transfer to 4N by Rapid response.

## 2016-04-18 NOTE — Progress Notes (Signed)
ANTICOAGULATION CONSULT NOTE - Initial Consult  Pharmacy Consult for heparin Indication: atrial fibrillation  Allergies  Allergen Reactions  . Iodinated Diagnostic Agents Rash and Swelling    Swelling in face, urticaria. Received Isovoc 370 (non-ionic) on 10/24/14.  . Isovue [Iopamidol] Itching and Swelling    Pt had slight erythema, itching and facial swelling right ear and right lower chin.  Pt sneezed several times after contrast injection.  Pt was monitored for 1 hr post injection and was given water to drink. Dr Gery Pray feels he had a very mild reaction.  Premeds are warranted in this patient.  Thanks, Gildardo Griffes    Patient Measurements: Height: 5\' 11"  (180.3 cm) Weight: 179 lb 12.8 oz (81.6 kg) IBW/kg (Calculated) : 75.3 Heparin Dosing Weight: 81.6 kg  Vital Signs: Temp: 98 F (36.7 C) (03/03 1614) Temp Source: Oral (03/03 1614) BP: 94/66 (03/03 1605) Pulse Rate: 54 (03/03 1605)  Labs:  Recent Labs  04/16/16 0613 04/18/16 0029 04/18/16 1809  HGB  --  9.5*  --   HCT  --  28.5*  --   PLT  --  149*  --   HEPARINUNFRC  --   --  1.47*  CREATININE 2.26* 3.03*  --     Estimated Creatinine Clearance: 17.3 mL/min (by C-G formula based on SCr of 3.03 mg/dL (H)).   Assessment: 83 yo male on chronic Eliquis for afib admitted with hip fracture.  Eliquis held in anticipation of OR soon.  Given full-dose Lovenox at [facility], however now with new renal dysfunction.  Pharmacy asked to start IV heparin since surgical plan still a bit unclear given worsening renal function.  Last Lovenox given 3/2 at 1230 PM.  Hep started at 1000 units/hr w/o bolus Initial lvl 1.47 - high  Drawn from arm opposite infusion, no bleeding per rn Told to hold hep at 1925  Goal of Therapy:  Heparin level 0.3-0.7 units/ml Monitor platelets by anticoagulation protocol: Yes   Plan:  Hold heparin x 1 hr Resume heparin at 2030, dose 700 units/hr Next lvl with am labs Will add on aptt as  well to confirm no apixaban affects still d/t renal failure  Isaac Bliss, PharmD, BCPS, BCCCP Clinical Pharmacist 04/18/2016 7:26 PM

## 2016-04-18 NOTE — Progress Notes (Signed)
I spoke with his family again today. Given his increasing creatinine decreasing renal function and dehydration I spoke with the hospitalist with fillet be safer to delay his surgery for a day to allow for better hydration. Family is in agreement with this. We will keep him nothing by mouth at midnight tonight and plan for OR Sunday AM   Ragen Laver D

## 2016-04-18 NOTE — Significant Event (Signed)
Rapid Response Event Note Lynch NP called for SBP 60-70's spite fluid bolus Overview: Time Called: 2221 Arrival Time: 2223 Event Type: Hypotension  Initial Focused Assessment: On arrival pt alert and oriented x3, per family this is his baseline, pt noted to be short of breath when talking other wise no distress noted, he does endorse SOB when asked. Skin warm and dry. Dr. Isidoro Donning evaluated pt earlier this evening wanted pt transferred to SDU if if SBP less than 90 after fluid bolus.   Interventions: Pt received three separate NS bolus with some improvement. Pt transferred to SDU 4N10   Event Summary: Name of Physician Notified: Burnadette Peter NP  at 2230    at    Outcome: Transferred (Comment)     Phillips Grout, Sandi Carne

## 2016-04-18 NOTE — Progress Notes (Signed)
[facility]                                                                              Patient Demographics  Connor Diaz, is a 83 y.o. male, DOB - 12-28-1923, OZH:[id number]  Admit date - 04/14/2016   Admitting Physician Connor Blue, MD  Outpatient Primary MD for the patient is Connor Stalling, MD  Outpatient specialists:   LOS - 4  days    Chief Complaint  Patient presents with  . Dizziness  . Fall       Brief summary   Patient is a 83 year old male with medical history significant of AAA; afib, on Eliquis; BPH; CKD stage 3; HTN; hypothyroidism; bradycardia s/p pacemaker placement; and CHF (EF 20-25% in 8/17) who originally presented to [facility] with right hip fracture on 04/14/16 after a mechanical fall. Patient was seen by orthopedics, Dr. Romeo Diaz however due to his significant cardiac history, cardiologist (Dr Connor Diaz) and other medical issues, Dr. Romeo Diaz recommended transfer to [facility] for orthopedics surgery and medical management.   Assessment & Plan    Principal Problem:  Right Hip fracture (HCC) - Patient transferred from [facility] hospital due to significant cardiac history - 2-D echo 2/28 showed EF of 25-30% with diffuse hypokinesis in the basal and mid inferolateral myocardium - Orthopedics following, surgery on hold for now due to hypotension, renal insufficiency   Active Problems:   AAA/ Aneurysm of abdominal vessel (HCC) - Ultrasound abdominal done, showed Interval growth of an infrarenal abdominal aortic aneurysm which measures 6.7 cm AP x 7.2 cm transversely. - Patient was seen on 05/22/15 in [facility], seen by Dr Connor Diaz, at that time AAA measured 6.1 x 6.2 cm, patient was recommended to follow up. Another note on 06/24/15 stated that he was not interested in moving forward with surgery due to potential impact on his quality of life - Vasc surgery consulted, Dr. Darrick Diaz discussed in detail with patient  and wife in detail, recommended outpatient electively follow up with Dr. Pattricia Diaz at [facility] if he recovers from his current acute medical problems, if patient has symptoms of rupture during the hospital stay, would consider open repair     Non-ischemic cardiomyopathy [facility]), Paroxysmal atrial fibrillation (HCC) - Rate controlled, with pacer, on amiodarone - Currently, eliquis on hold, CHADvasc score 5 - Placed on heparin drip without bolus, while awaiting surgery  . Cardiology following.     Abdominal distention  - feels a lot better today, had large BM earlier this morning, placed on solid diet    Acute on CKD (chronic kidney disease) stage 3, GFR 30-59 ml/min - Baseline creatinine ~1.7 - Per family, has not been eating well in the last few days - nephrology consult obtained, recommended increasing IV fluids, and monitor signs for any volume overload. Patient did not want to any dialysis.   Code Status: DNR  DVT Prophylaxis:  Lovenox  Family Communication: Discussed in detail with the patient, all imaging results, lab results explained to the patient, son, wife, other family members in the room   Disposition Plan:   Time Spent in minutes  25 minutes  Procedures:  2-D echo  Consultants:   Cardiology Orthopedics  nephrology Vascular surgery  Antimicrobials:      Medications  Scheduled Meds: . amiodarone  100 mg Oral Daily  . atorvastatin  40 mg Oral q1800  . Chlorhexidine Gluconate Cloth  6 each Topical Daily  . famotidine  20 mg Oral QPM  . feeding supplement  1 Container Oral TID BM  . finasteride  5 mg Oral QPM  . levothyroxine  75 mcg Oral QAC breakfast  . multivitamin with minerals  1 tablet Oral Daily  . mupirocin ointment  1 application Nasal BID  . senna-docusate  1 tablet Oral BID  . sodium chloride  500 mL Intravenous Once   Continuous Infusions: . sodium chloride 75 mL/hr at 04/18/16 0514  . heparin 1,000 Units/hr (04/18/16 1049)   PRN  Meds:.HYDROcodone-acetaminophen, methocarbamol **OR** methocarbamol (ROBAXIN)  IV, morphine injection, ondansetron (ZOFRAN) IV, ondansetron, polyethylene glycol   Antibiotics   Anti-infectives    None        Subjective:   Connor Diaz was seen and examined today. Continues to complain of right hip pain. Overnight issues noted with hypotension, had to be transferred to stepdown unit. BM this morning. Feels better. No abdominal pain.  No nausea or vomiting.  Patient denies dizziness,  new weakness, numbess, tingling.   Objective:   Vitals:   04/18/16 0800 04/18/16 0823 04/18/16 1140 04/18/16 1200  BP: (!) 98/57   (!) 85/70  Pulse: 91   86  Resp: 16   (!) 21  Temp:  97.7 F (36.5 C) 98 F (36.7 C)   TempSrc:  Oral Oral   SpO2: 100%   96%  Weight:      Height:        Intake/Output Summary (Last 24 hours) at 04/18/16 1333 Last data filed at 04/18/16 1300  Gross per 24 hour  Intake          1819.33 ml  Output              276 ml  Net          1543.33 ml     Wt Readings from Last 3 Encounters:  04/18/16 81.6 kg (179 lb 12.8 oz)  04/14/16 79 kg (174 lb 1.6 oz)  10/23/15 68 kg (150 lb)     Exam  General: Alert and oriented x 3, NAD  HEENT:    Neck: Supple, no JVD, no masses  Cardiovascular: S1 S2 clear, irregularly regular   Respiratory: Clear to auscultation bilaterally, no wheezing, rales or rhonchi  Gastrointestinal: Soft, nontender, Mildly distended , + bowel sounds  Ext: no cyanosis clubbing or edema  Neuro:no new deficits   Skin: No rashes  Psych: Normal affect and demeanor, alert and oriented x3    Data Reviewed:  I have personally reviewed following labs and imaging studies  Micro Results Recent Results (from the past 240 hour(s))  Surgical PCR screen     Status: Abnormal   Collection Time: 04/18/16 12:30 AM  Result Value Ref Range Status   MRSA, PCR NEGATIVE NEGATIVE Final   Staphylococcus aureus POSITIVE (A) NEGATIVE Final     Comment:        The Xpert SA Assay (FDA approved for NASAL specimens in patients over 30 years of age), is one component of a comprehensive surveillance program.  Test performance has been validated by [facility] for patients greater than or equal to 19 year old. It is not  intended to diagnose infection nor to guide or monitor treatment.     Radiology Reports Dg Chest 1 View  Result Date: 04/14/2016 CLINICAL DATA:  Dizziness.  Fall. EXAM: CHEST 1 VIEW FINDINGS: Cardiac pacer with lead tips over the right atrium right ventricle. Cardiomegaly. Mild pulmonary vascular prominence with mild interstitial prominence. Mild CHF cannot be excluded. Underlying chronic interstitial lung disease most likely present. Small right pleural effusion cannot be excluded. Left costophrenic angle not imaged. No pneumothorax. No acute bony abnormalities. Degenerative changes both shoulders. IMPRESSION: 1. Cardiac pacer stable position. Cardiomegaly with mild bilateral from interstitial prominence suggesting mild CHF. Tiny right pleural effusion cannot be excluded. 2. Underlying chronic interstitial lung disease most likely present . Electronically Signed   By: Maisie Fus  Register   On: 04/14/2016 17:00   Dg Knee 1-2 Views Right  Result Date: 04/14/2016 CLINICAL DATA:  Fall. EXAM: RIGHT KNEE - 1-2 VIEW COMPARISON:  04/14/2016. FINDINGS: Soft tissue swelling. Small effusion cannot be excluded. Severe tricompartment degenerative change in osteopenia. Subtle fracture, age undetermined, of the superior aspect of patella cannot be excluded. Bipartite patella could also present in this fashion. Peripheral vascular calcification . IMPRESSION: 1. Soft tissue swelling. Small effusion cannot be excluded. Subtle fracture, age undetermined, superior aspect of the patella. Bipartite patella could also present in this fashion. 2. Diffuse osteopenia and severe degenerative change. 3. Peripheral vascular disease. Electronically Signed    By: Maisie Fus  Register   On: 04/14/2016 17:02   US Aorta  Result Date: 04/15/2016 CLINICAL DATA:  Follow-up of an abdominal aortic aneurysm measuring 5.6 x 5.7 cm seen on CT angiogram of the abdomen pelvis of October 24, 2014 EXAM: ULTRASOUND OF ABDOMINAL AORTA TECHNIQUE: Ultrasound examination of the abdominal aorta was performed to evaluate for abdominal aortic aneurysm. COMPARISON:  CT angiogram of October 24, 2014 FINDINGS: Abdominal Aorta The proximal and mid aorta are obscured by bowel gas. There is an infrarenal abdominal aortic aneurysm measuring 6.7 cm AP x 7.2 cm transversely. There is considerable mural plaque and thrombus. The luminal diameter is approximately 3.5 cm. The aneurysm is 9.2 cm in length. The right common iliac artery measures 1.6 x 1.8 cm. The left common iliac artery measures 2.1 x 1.7 cm. Maximum Diameter: 7.2 cm in the transverse plane. IMPRESSION: Interval growth of an infrarenal abdominal aortic aneurysm which measures 6.7 cm AP x 7.2 cm transversely. It is 9.2 cm in length and extends into the left common iliac artery. The proximal and mid aorta could not be imaged due to bowel gas and the patient's clinical condition which limited changes in position. These results will be called to the ordering clinician or representative by the Radiologist Assistant, and communication documented in the PACS or zVision Dashboard. Electronically Signed   By: David  [place] M.D.   On: 04/15/2016 13:42   Dg Chest Port 1 View  Result Date: 04/17/2016 CLINICAL DATA:  Acute onset of respiratory distress. Initial encounter. EXAM: PORTABLE CHEST 1 VIEW COMPARISON:  Chest radiograph performed 04/14/2016 FINDINGS: The lungs are well-aerated. Bibasilar airspace opacities raise concern for pneumonia, though mild interstitial edema could have a similar appearance. No definite pleural effusion or pneumothorax is seen. The cardiomediastinal silhouette is mildly enlarged. A pacemaker is noted overlying the  left chest wall, with leads ending overlying the right atrium and right ventricle. No acute osseous abnormalities are seen. IMPRESSION: 1. Bibasilar airspace opacities raise concern for pneumonia, though mild interstitial edema could have a similar appearance. 2. Mild cardiomegaly. Electronically Signed  By: Roanna Raider M.D.   On: 04/17/2016 23:46   Dg Abd Portable 2v  Result Date: 04/17/2016 CLINICAL DATA:  Abdominal distention. EXAM: PORTABLE ABDOMEN - 2 VIEW COMPARISON:  CT abdomen and pelvis 10/24/2014. FINDINGS: No free intraperitoneal air is identified. There is mild gaseous distention of small and large bowel diffusely. Prominent stool burden ascending colon noted. IMPRESSION: Bowel gas pattern most in keeping with ileus. Large stool burden ascending colon. Electronically Signed   By: Drusilla Kanner M.D.   On: 04/17/2016 12:11   Dg Hip Unilat W Or Wo Pelvis 2-3 Views Right  Result Date: 04/14/2016 CLINICAL DATA:  Fall today with right hip pain and deformity EXAM: DG HIP (WITH OR WITHOUT PELVIS) 2-3V RIGHT COMPARISON:  None. FINDINGS: Bones are diffusely demineralized. Comminuted intertrochanteric right femoral neck fracture noted with varus angulation. Lesser trochanter exists as a free fragment. SI joints and symphysis pubis unremarkable. Distal aortic and iliac artery atherosclerosis evident. IMPRESSION: 1. Comminuted intertrochanteric right femoral neck fracture with varus angulation. 2.  Abdominal Aortic Atherosclerois (ICD10-170.0) Electronically Signed   By: Kennith Center M.D.   On: 04/14/2016 17:01    Lab Data:  CBC:  Recent Labs Lab 04/14/16 1609 04/15/16 0550 04/18/16 0029  WBC 8.3 9.5 10.7*  NEUTROABS 4.5  --  7.4  HGB 12.5* 10.6* 9.5*  HCT 36.5* 31.5* 28.5*  MCV 99.7 100.6* 100.7*  PLT 152 137* 149*   Basic Metabolic Panel:  Recent Labs Lab 04/14/16 1609 04/15/16 0550 04/16/16 0613 04/18/16 0029  NA 135 135 136 133*  K 4.8 5.3* 5.6* 4.4  CL 104 106 104 105    CO2 23 24 23  19*  GLUCOSE 120* 110* 123* 127*  BUN 37* 38* 45* 67*  CREATININE 1.74* 1.79* 2.26* 3.03*  CALCIUM 8.9 8.3* 8.8* 8.1*   GFR: Estimated Creatinine Clearance: 17.3 mL/min (by C-G formula based on SCr of 3.03 mg/dL (H)). Liver Function Tests:  Recent Labs Lab 04/14/16 1609 04/18/16 0029  AST 18 33  ALT 11* 11*  ALKPHOS 44 36*  BILITOT 0.6 0.8  PROT 6.8 5.6*  ALBUMIN 3.6 2.7*   No results for input(s): LIPASE, AMYLASE in the last 168 hours. No results for input(s): AMMONIA in the last 168 hours. Coagulation Profile: No results for input(s): INR, PROTIME in the last 168 hours. Cardiac Enzymes: No results for input(s): CKTOTAL, CKMB, CKMBINDEX, TROPONINI in the last 168 hours. BNP (last 3 results) No results for input(s): PROBNP in the last 8760 hours. HbA1C: No results for input(s): HGBA1C in the last 72 hours. CBG:  Recent Labs Lab 04/14/16 1604  GLUCAP 117*   Lipid Profile: No results for input(s): CHOL, HDL, LDLCALC, TRIG, CHOLHDL, LDLDIRECT in the last 72 hours. Thyroid Function Tests: No results for input(s): TSH, T4TOTAL, FREET4, T3FREE, THYROIDAB in the last 72 hours. Anemia Panel: No results for input(s): VITAMINB12, FOLATE, FERRITIN, TIBC, IRON, RETICCTPCT in the last 72 hours. Urine analysis:    Component Value Date/Time   COLORURINE YELLOW 04/18/2016 0620   APPEARANCEUR CLEAR 04/18/2016 0620   LABSPEC 1.018 04/18/2016 0620   PHURINE 5.0 04/18/2016 0620   GLUCOSEU NEGATIVE 04/18/2016 0620   HGBUR NEGATIVE 04/18/2016 0620   BILIRUBINUR NEGATIVE 04/18/2016 0620   KETONESUR NEGATIVE 04/18/2016 0620   PROTEINUR NEGATIVE 04/18/2016 0620   NITRITE NEGATIVE 04/18/2016 0620   LEUKOCYTESUR NEGATIVE 04/18/2016 1610     Neldon Shepard M.D. [facility] 04/18/2016, 1:33 PM  Pager: [phone] Between 7am to 7pm - call Pager - [phone]  After 7pm go to [email or web address] - password [email or web address]  Call night coverage person covering after 7pm

## 2016-04-18 NOTE — Consult Note (Signed)
Referring Physician: Dr Isidoro Donning  Patient name: Connor Diaz MRN: [id number] DOB: [birth year] Sex: male  REASON FOR CONSULT: AAA  HPI: Connor Diaz is a 83 y.o. male with known abdominal aortic aneurysm followed by my partner Dr Edilia Bo since 2015.  Pt not deemed to be a standard aortic stent graft candidate due to the large aortic neck 36mm.  He had previously been referred to Dr Pattricia Boss at [facility] for evaluation for branched device.  Pt was admitted to hospital with right hip fracture after a fall 5 days ago. Other medical problems include acute worsening of renal function Creatinine 1.7 climbing to 3 today with some metabolic acidosis, mild protein calorie malnutrition with albumin of 2.7, CAD, hypertension, afib, cardiomyopathy with EF 25% this admission.  Repair of hip fracture cancelled today due to low BP.  Past Medical History:  Diagnosis Date  . AAA (abdominal aortic aneurysm) (HCC)    a. 02/2009 U/S 4.4 x 4.5 cm  . BPH (benign prostatic hyperplasia)   . CKD (chronic kidney disease), stage III   . Coronary artery disease   . DJD (degenerative joint disease)   . History of tobacco abuse    a. roughly 36 pack years, quit @ age 35.  Marland Kitchen Hypertension   . Hypothyroidism   . Non-ischemic cardiomyopathy (HCC)   . PAF (paroxysmal atrial fibrillation) (HCC)    a. noted 11/2011  . Pancreatitis    a. 10/2011 - managed conservatively @ home.  . Symptomatic bradycardia    a. s/p STJ dual chamber PPM   . Systolic heart failure (HCC)    02/2013   Past Surgical History:  Procedure Laterality Date  . CATARACT EXTRACTION W/ INTRAOCULAR LENS  IMPLANT, BILATERAL Bilateral 1980's  . CORONARY ANGIOGRAM  11/19/2011   Procedure: CORONARY ANGIOGRAM;  Surgeon: [name], MD;  Location: [facility] CATH LAB;  Service: Cardiovascular;;  . CORONARY ANGIOPLASTY WITH STENT PLACEMENT  12/22/2011    LAD & CIRCUMFLEX  . EYE SURGERY    . PERCUTANEOUS CORONARY STENT INTERVENTION (PCI-S) N/A 12/22/2011     Procedure: PERCUTANEOUS CORONARY STENT INTERVENTION (PCI-S);  Surgeon: [name], MD;  Location: [facility] CATH LAB;  Service: Cardiovascular;  Laterality: N/A;  . PERMANENT PACEMAKER INSERTION N/A 11/20/2011   STJ dual chamber PPM implanted by [name] for symptomatic bradycardia   . TONSILLECTOMY  ~ 1933    Family History  Problem Relation Age of Onset  . Other Father     Accidental death @ age 53 - box fell on him at work  . Cancer Mother     died @ 52 Bladder cancer    SOCIAL HISTORY: Social History   Social History  . Marital status: Married    Spouse name: N/A  . Number of children: N/A  . Years of education: N/A   Occupational History  . retired    Social History Main Topics  . Smoking status: Former Smoker    Packs/day: 1.00    Years: 35.00    Types: Cigarettes    Quit date: 12/17/1980  . Smokeless tobacco: Never Used     Comment: Quit at age 78.  Marland Kitchen Alcohol use No  . Drug use: No  . Sexual activity: Not Currently   Other Topics Concern  . Not on file   Social History Narrative   Pt lives in [place] with his wife.  He is retired from [employer].  He is relatively active @ home.  Allergies  Allergen Reactions  . Iodinated Diagnostic Agents Rash and Swelling    Swelling in face, urticaria. Received Isovoc 370 (non-ionic) on 10/24/14.  . Isovue [Iopamidol] Itching and Swelling    Pt had slight erythema, itching and facial swelling right ear and right lower chin.  Pt sneezed several times after contrast injection.  Pt was monitored for 1 hr post injection and was given water to drink. Dr Gery Pray feels he had a very mild reaction.  Premeds are warranted in this patient.  Thanks, J Bohm    Current Facility-Administered Medications  Medication Dose Route Frequency Provider Last Rate Last Dose  . 0.9 %  sodium chloride infusion   Intravenous Continuous Ripudeep Jenna Luo, MD 75 mL/hr at 04/18/16 0514    . amiodarone (PACERONE) tablet 100 mg  100 mg  Oral Daily Jonah Blue, MD   100 mg at 04/17/16 0942  . atorvastatin (LIPITOR) tablet 40 mg  40 mg Oral q1800 Jonah Blue, MD   40 mg at 04/17/16 1714  . Chlorhexidine Gluconate Cloth 2 % PADS 6 each  6 each Topical Daily Ripudeep K Rai, MD      . famotidine (PEPCID) tablet 20 mg  20 mg Oral QPM Jonah Blue, MD   20 mg at 04/17/16 1714  . feeding supplement (BOOST / RESOURCE BREEZE) liquid 1 Container  1 Container Oral TID BM Ripudeep Jenna Luo, MD   1 Container at 04/17/16 1400  . finasteride (PROSCAR) tablet 5 mg  5 mg Oral QPM Jonah Blue, MD   5 mg at 04/17/16 1714  . HYDROcodone-acetaminophen (NORCO/VICODIN) 5-325 MG per tablet 1-2 tablet  1-2 tablet Oral Q4H PRN Jonah Blue, MD   2 tablet at 04/17/16 0425  . levothyroxine (SYNTHROID, LEVOTHROID) tablet 75 mcg  75 mcg Oral QAC breakfast Jonah Blue, MD   75 mcg at 04/17/16 [id number]  . methocarbamol (ROBAXIN) tablet 500 mg  500 mg Oral Q6H PRN Jonah Blue, MD   500 mg at 04/16/16 9604   Or  . methocarbamol (ROBAXIN) 500 mg in dextrose 5 % 50 mL IVPB  500 mg Intravenous Q6H PRN Jonah Blue, MD      . morphine 2 MG/ML injection 2 mg  2 mg Intravenous Q2H PRN Jonah Blue, MD   2 mg at 04/18/16 5409  . multivitamin with minerals tablet 1 tablet  1 tablet Oral Daily Oval Linsey, MD   1 tablet at 04/17/16 [id number]  . mupirocin ointment (BACTROBAN) 2 % 1 application  1 application Nasal BID Ripudeep K Rai, MD      . ondansetron (ZOFRAN) injection 4 mg  4 mg Intravenous Q4H PRN Oval Linsey, MD      . ondansetron [facility] tablet 4 mg  4 mg Oral Q4H PRN Oval Linsey, MD   4 mg at 04/17/16 0954  . polyethylene glycol (MIRALAX / GLYCOLAX) packet 17 g  17 g Oral Daily PRN Jonah Blue, MD      . senna-docusate (Senokot-S) tablet 1 tablet  1 tablet Oral BID Ripudeep K Rai, MD      . sodium chloride 0.9 % bolus 500 mL  500 mL Intravenous Once Jinger Neighbors, NP        ROS:   General:  No weight loss, Fever, chills  HEENT:  No recent headaches, no nasal bleeding, no visual changes, no sore throat  Neurologic: No dizziness, blackouts, seizures. No recent symptoms of stroke or mini- stroke. No recent episodes of slurred speech, or temporary  blindness.  Cardiac: No recent episodes of chest pain/pressure, no shortness of breath at rest.  + shortness of breath with exertion. + history of atrial fibrillation or irregular heartbeat  Vascular: No history of rest pain in feet.  No history of claudication.  No history of non-healing ulcer, No history of DVT   Pulmonary: No home oxygen, no productive cough, no hemoptysis,  No asthma or wheezing  Musculoskeletal:  [X]  Arthritis, [ ]  Low back pain,  [X]  Joint pain  Hematologic:No history of hypercoagulable state.  No history of easy bleeding.  No history of anemia  Gastrointestinal: No hematochezia or melena,  No gastroesophageal reflux, no trouble swallowing  Urinary: [X]  chronic Kidney disease, [ ]  on HD - [ ]  MWF or [ ]  TTHS, [ ]  Burning with urination, [ ]  Frequent urination, [ ]  Difficulty urinating;   Skin: No rashes  Psychological: No history of anxiety,  No history of depression  DATA:   I reviewed the images from the patient's CT from 2016 as well as US aorta from this admission. Neck of aorta was just under 32 mm diameter in 2016.  AAA was 5.7 cm.  US aorta today shows diameter 7.2 cm.  Comment was made of length of aneurysm 9.2 cm.  Length measurements of AAA are irrelevant and have no bearing on risk of rupture.  Physical Examination  Vitals:   04/18/16 0136 04/18/16 0205 04/18/16 0325 04/18/16 0400  BP:  (!) 92/57 97/62 100/60  Pulse:  84 71 71  Resp:  14 15 14   Temp:   98.8 F (37.1 C)   TempSrc:   Oral   SpO2: 99% 98% 97% 97%  Weight:      Height:        Body mass index is 25.08 kg/m.  General:  Alert and oriented, no acute distress HEENT: Normal Pulmonary: Clear to auscultation bilaterally Cardiac: Regular Rate and Rhythm  Abdomen:  Soft, non-tender, non-distended,  Skin: No rash Extremity Pulses:  2+ radial, brachial, femoral pulses bilaterally Musculoskeletal: right leg hip flexed externally rotated complaining of pain  Neurologic: Upper and lower extremity motor 5/5 and symmetric  ASSESSMENT:  7.2 cm AAA in a 83 y/o with worsening renal failure acute hip fracture and baseline cardiomyopathy with EF 25%.  Risk of rupture of 7 cm AAA is about 20% per year.  He is not a conventional stent graft candidate and would need investigational device to treat his AAA if felt a candidate by Dr Pattricia Boss at [facility].  However in the face of acute renal failure and acute hip fracture his risk of mortality is extremely high and to consider elective repair of this non ruptured 7.2 cm AAA whether by endovascular or open means is not in the pt best interest and would care mortality much higher than his 20% risk of rupture.    If pt was to have symptoms of rupture during his hospital stay would consider open repair but in light of his age and comorbities he would most likely not survive in the postoperative period and certainly have diminished overall quality of life.  Available if questions.  Otherwise pt can follow up electively with Dr Pattricia Boss at [facility] if he recovers from his current acute medical problems   PLAN:  See above   Fabienne Bruns, MD [facility] Office: [phone] Pager: [phone]

## 2016-04-18 NOTE — Progress Notes (Signed)
Subjective:  Patient denies any chest pain or shortness of breath complains of right hip pain scheduled for surgery tomorrow. And episode of hypotension and worsening renal function was transferred to stepdown unit  Objective:  Vital Signs in the last 24 hours: Temp:  [97.7 F (36.5 C)-99.4 F (37.4 C)] 97.7 F (36.5 C) (03/03 0823) Pulse Rate:  [69-91] 91 (03/03 0800) Resp:  [14-19] 16 (03/03 0800) BP: (69-113)/(43-87) 98/57 (03/03 0800) SpO2:  [95 %-100 %] 100 % (03/03 0800) Weight:  [179 lb 12.8 oz (81.6 kg)] 179 lb 12.8 oz (81.6 kg) (03/03 0021)  Intake/Output from previous day: 03/02 0701 - 03/03 0700 In: 287.5 [P.O.:200; I.V.:87.5] Out: 275 [Urine:275] Intake/Output from this shift: No intake/output data recorded.  Physical Exam: Neck: no adenopathy, no carotid bruit, no JVD and supple, symmetrical, trachea midline Lungs: clear to auscultation bilaterally Heart: irregularly irregular rhythm, S1, S2 normal and 2/6 systolic murmur noted Abdomen: soft, non-tender; bowel sounds normal; no masses,  no organomegaly Extremities: extremities normal, atraumatic, no cyanosis or edema  Lab Results:  Recent Labs  04/18/16 0029  WBC 10.7*  HGB 9.5*  PLT 149*    Recent Labs  04/16/16 0613 04/18/16 0029  NA 136 133*  K 5.6* 4.4  CL 104 105  CO2 23 19*  GLUCOSE 123* 127*  BUN 45* 67*  CREATININE 2.26* 3.03*   No results for input(s): TROPONINI in the last 72 hours.  Invalid input(s): CK, MB Hepatic Function Panel  Recent Labs  04/18/16 0029  PROT 5.6*  ALBUMIN 2.7*  AST 33  ALT 11*  ALKPHOS 36*  BILITOT 0.8   No results for input(s): CHOL in the last 72 hours. No results for input(s): PROTIME in the last 72 hours.  Imaging: Imaging results have been reviewed and Dg Chest Port 1 View  Result Date: 04/17/2016 CLINICAL DATA:  Acute onset of respiratory distress. Initial encounter. EXAM: PORTABLE CHEST 1 VIEW COMPARISON:  Chest radiograph performed 04/14/2016  FINDINGS: The lungs are well-aerated. Bibasilar airspace opacities raise concern for pneumonia, though mild interstitial edema could have a similar appearance. No definite pleural effusion or pneumothorax is seen. The cardiomediastinal silhouette is mildly enlarged. A pacemaker is noted overlying the left chest wall, with leads ending overlying the right atrium and right ventricle. No acute osseous abnormalities are seen. IMPRESSION: 1. Bibasilar airspace opacities raise concern for pneumonia, though mild interstitial edema could have a similar appearance. 2. Mild cardiomegaly. Electronically Signed   By: Roanna Raider M.D.   On: 04/17/2016 23:46   Dg Abd Portable 2v  Result Date: 04/17/2016 CLINICAL DATA:  Abdominal distention. EXAM: PORTABLE ABDOMEN - 2 VIEW COMPARISON:  CT abdomen and pelvis 10/24/2014. FINDINGS: No free intraperitoneal air is identified. There is mild gaseous distention of small and large bowel diffusely. Prominent stool burden ascending colon noted. IMPRESSION: Bowel gas pattern most in keeping with ileus. Large stool burden ascending colon. Electronically Signed   By: Drusilla Kanner M.D.   On: 04/17/2016 12:11    Cardiac Studies:  Assessment/Plan:  Comminuted right  trochanteric femoral neck fracture, status post fall Multivessel coronary artery disease, history of silent inferior wall MI and remote past, status post multivessel PCI to LAD, left circumflex and obtuse marginal 3 in November 2013. Ischemic cardiomyopathy. Tachybradycardia syndrome status post permanent pacemaker in the past History of congestive heart failure secondary to depressed LV systolic function. Critical abdominal aortic aneurysm.being followed at [facility] Chronic atrial fibrillation chads vasc score of 5 on  chronic anticoagulation Acute on chronic kidney disease stage III./Hypovolemia secondary to hypotension and hypovolemia and ACE  inhibitor Hypotension Hyperlipidemia. Hypothyroidism. Degenerative joint disease. Plan Agree with slow hydration and monitor renal function and switching Lovenox to heparin.  LOS: 4 days    Rinaldo Cloud 04/18/2016, 11:22 AM

## 2016-04-19 ENCOUNTER — Inpatient Hospital Stay (HOSPITAL_COMMUNITY): Payer: Medicare Other

## 2016-04-19 ENCOUNTER — Encounter (HOSPITAL_COMMUNITY): Payer: Self-pay | Admitting: Certified Registered"

## 2016-04-19 ENCOUNTER — Inpatient Hospital Stay (HOSPITAL_COMMUNITY): Payer: Medicare Other | Admitting: Certified Registered Nurse Anesthetist

## 2016-04-19 ENCOUNTER — Encounter (HOSPITAL_COMMUNITY)
Admission: EM | Disposition: A | Discharge status: Home / Self Care | Payer: Self-pay | Source: Home / Self Care | Attending: Internal Medicine

## 2016-04-19 DIAGNOSIS — S72141A Displaced intertrochanteric fracture of right femur, initial encounter for closed fracture: Secondary | ICD-10-CM

## 2016-04-19 HISTORY — PX: INTRAMEDULLARY (IM) NAIL INTERTROCHANTERIC: SHX5875

## 2016-04-19 LAB — SURGICAL PCR SCREEN
MRSA, PCR: NEGATIVE
Staphylococcus aureus: POSITIVE — AB

## 2016-04-19 LAB — APTT
APTT: 57 s — AB (ref 24–36)
aPTT: 34 seconds (ref 24–36)

## 2016-04-19 LAB — CBC
HEMATOCRIT: 22.8 % — AB (ref 39.0–52.0)
HEMOGLOBIN: 7.7 g/dL — AB (ref 13.0–17.0)
MCH: 33.8 pg (ref 26.0–34.0)
MCHC: 33.8 g/dL (ref 30.0–36.0)
MCV: 100 fL (ref 78.0–100.0)
Platelets: 140 10*3/uL — ABNORMAL LOW (ref 150–400)
RBC: 2.28 MIL/uL — ABNORMAL LOW (ref 4.22–5.81)
RDW: 15.2 % (ref 11.5–15.5)
WBC: 7.7 10*3/uL (ref 4.0–10.5)

## 2016-04-19 LAB — BASIC METABOLIC PANEL
ANION GAP: 5 (ref 5–15)
BUN: 69 mg/dL — AB (ref 6–20)
CO2: 20 mmol/L — ABNORMAL LOW (ref 22–32)
Calcium: 7.8 mg/dL — ABNORMAL LOW (ref 8.9–10.3)
Chloride: 108 mmol/L (ref 101–111)
Creatinine, Ser: 2.91 mg/dL — ABNORMAL HIGH (ref 0.61–1.24)
GFR calc Af Amer: 20 mL/min — ABNORMAL LOW (ref >60)
GFR, EST NON AFRICAN AMERICAN: 18 mL/min — AB (ref >60)
GLUCOSE: 110 mg/dL — AB (ref 65–99)
POTASSIUM: 4.6 mmol/L (ref 3.5–5.1)
Sodium: 133 mmol/L — ABNORMAL LOW (ref 135–145)

## 2016-04-19 LAB — PREPARE RBC (CROSSMATCH)

## 2016-04-19 LAB — PROTIME-INR
INR: 1.26
PROTHROMBIN TIME: 15.8 s — AB (ref 11.4–15.2)

## 2016-04-19 LAB — HEMOGLOBIN AND HEMATOCRIT, BLOOD
HCT: 26.6 % — ABNORMAL LOW (ref 39.0–52.0)
Hemoglobin: 8.8 g/dL — ABNORMAL LOW (ref 13.0–17.0)

## 2016-04-19 LAB — HEPARIN LEVEL (UNFRACTIONATED): Heparin Unfractionated: 1.26 IU/mL — ABNORMAL HIGH (ref 0.30–0.70)

## 2016-04-19 LAB — ABO/RH: ABO/RH(D): A POS

## 2016-04-19 SURGERY — FIXATION, FRACTURE, INTERTROCHANTERIC, WITH INTRAMEDULLARY ROD
Anesthesia: Monitor Anesthesia Care | Site: Hip | Laterality: Right

## 2016-04-19 MED ORDER — PROPOFOL 10 MG/ML IV BOLUS
INTRAVENOUS | Status: DC | PRN
  Administered 2016-04-19 (×3): 50 mg via INTRAVENOUS

## 2016-04-19 MED ORDER — ALBUMIN HUMAN 5 % IV SOLN
INTRAVENOUS | Status: DC | PRN
  Administered 2016-04-19: 10:00:00 via INTRAVENOUS

## 2016-04-19 MED ORDER — SUFENTANIL CITRATE 50 MCG/ML IV SOLN
INTRAVENOUS | Status: AC
  Filled 2016-04-19: qty 1

## 2016-04-19 MED ORDER — PROPOFOL 10 MG/ML IV BOLUS
INTRAVENOUS | Status: AC
  Filled 2016-04-19: qty 20

## 2016-04-19 MED ORDER — FENTANYL CITRATE (PF) 100 MCG/2ML IJ SOLN
25.0000 ug | INTRAMUSCULAR | Status: DC | PRN
  Administered 2016-04-19 (×3): 25 ug via INTRAVENOUS

## 2016-04-19 MED ORDER — SODIUM CHLORIDE 0.9 % IV SOLN: 10.0000 mL/h | Freq: Once | INTRAVENOUS | Status: DC

## 2016-04-19 MED ORDER — EPHEDRINE 5 MG/ML INJ
INTRAVENOUS | Status: AC
  Filled 2016-04-19: qty 10

## 2016-04-19 MED ORDER — OXYCODONE HCL 5 MG PO TABS: 5.0000 mg | ORAL_TABLET | Freq: Once | ORAL | Status: DC | PRN

## 2016-04-19 MED ORDER — LACTATED RINGERS IV SOLN
INTRAVENOUS | Status: DC | PRN
  Administered 2016-04-19: 07:00:00 via INTRAVENOUS

## 2016-04-19 MED ORDER — EPHEDRINE SULFATE 50 MG/ML IJ SOLN
INTRAMUSCULAR | Status: DC | PRN
  Administered 2016-04-19 (×2): 10 mg via INTRAVENOUS
  Administered 2016-04-19: 5 mg via INTRAVENOUS

## 2016-04-19 MED ORDER — 0.9 % SODIUM CHLORIDE (POUR BTL) OPTIME
TOPICAL | Status: DC | PRN
  Administered 2016-04-19: 1000 mL

## 2016-04-19 MED ORDER — BUPIVACAINE IN DEXTROSE 0.75-8.25 % IT SOLN
INTRATHECAL | Status: DC | PRN
  Administered 2016-04-19: 1.8 mL via INTRATHECAL

## 2016-04-19 MED ORDER — FENTANYL CITRATE (PF) 100 MCG/2ML IJ SOLN
INTRAMUSCULAR | Status: AC
  Administered 2016-04-19: 25 ug via INTRAVENOUS
  Filled 2016-04-19: qty 2

## 2016-04-19 MED ORDER — OXYCODONE HCL 5 MG/5ML PO SOLN: 5.0000 mg | Freq: Once | ORAL | Status: DC | PRN

## 2016-04-19 SURGICAL SUPPLY — 35 items
BNDG COHESIVE 4X5 TAN STRL (GAUZE/BANDAGES/DRESSINGS) ×3 IMPLANT
BNDG GAUZE ELAST 4 BULKY (GAUZE/BANDAGES/DRESSINGS) ×3 IMPLANT
COVER PERINEAL POST (MISCELLANEOUS) ×3 IMPLANT
COVER SURGICAL LIGHT HANDLE (MISCELLANEOUS) ×3 IMPLANT
DRAPE STERI IOBAN 125X83 (DRAPES) ×3 IMPLANT
DRSG MEPILEX BORDER 4X4 (GAUZE/BANDAGES/DRESSINGS) ×3 IMPLANT
DURAPREP 26ML APPLICATOR (WOUND CARE) ×3 IMPLANT
ELECT REM PT RETURN 9FT ADLT (ELECTROSURGICAL) ×3
ELECTRODE REM PT RTRN 9FT ADLT (ELECTROSURGICAL) ×1 IMPLANT
GLOVE BIO SURGEON STRL SZ7.5 (GLOVE) ×6 IMPLANT
GLOVE BIOGEL PI IND STRL 8 (GLOVE) ×2 IMPLANT
GLOVE BIOGEL PI INDICATOR 8 (GLOVE) ×4
GOWN STRL REUS W/ TWL LRG LVL3 (GOWN DISPOSABLE) ×3 IMPLANT
GOWN STRL REUS W/TWL LRG LVL3 (GOWN DISPOSABLE) ×6
GUIDEROD T2 3X1000 (ROD) ×3 IMPLANT
K-WIRE  3.2X450M STR (WIRE) ×2
K-WIRE 3.2X450M STR (WIRE) ×1
KIT ROOM TURNOVER OR (KITS) ×3 IMPLANT
KWIRE 3.2X450M STR (WIRE) ×1 IMPLANT
MANIFOLD NEPTUNE II (INSTRUMENTS) ×3 IMPLANT
NAIL LONG KIT TIGHR 10X400-125 (Nail) ×3 IMPLANT
NS IRRIG 1000ML POUR BTL (IV SOLUTION) ×3 IMPLANT
PACK GENERAL/GYN (CUSTOM PROCEDURE TRAY) ×3 IMPLANT
PAD ARMBOARD 7.5X6 YLW CONV (MISCELLANEOUS) ×3 IMPLANT
PAD CAST 4YDX4 CTTN HI CHSV (CAST SUPPLIES) ×1 IMPLANT
PADDING CAST COTTON 4X4 STRL (CAST SUPPLIES) ×2
SCREW LAG GAMMA 3 110MM (Screw) ×3 IMPLANT
SUT MNCRL AB 4-0 PS2 18 (SUTURE) ×3 IMPLANT
SUT MON AB 2-0 CT1 27 (SUTURE) ×3 IMPLANT
SUT MON AB 2-0 CT1 36 (SUTURE) ×3 IMPLANT
SUT VIC AB 0 CT1 27 (SUTURE) ×4
SUT VIC AB 0 CT1 27XBRD ANBCTR (SUTURE) ×2 IMPLANT
TAPE STRIPS DRAPE STRL (GAUZE/BANDAGES/DRESSINGS) ×3 IMPLANT
TOWEL OR 17X24 6PK STRL BLUE (TOWEL DISPOSABLE) ×3 IMPLANT
TOWEL OR 17X26 10 PK STRL BLUE (TOWEL DISPOSABLE) ×3 IMPLANT

## 2016-04-19 NOTE — Progress Notes (Signed)
Waiting on Pelvic Xray- report called - Radiology called at 1035 & 1110.

## 2016-04-19 NOTE — Progress Notes (Signed)
[facility] ROUNDING NOTE   Subjective:   Interval HistoryThe patient is an 83 year old male with a past medical history of AAA, A. fib on Eliquis,BPH, hypertension, hypothyroidism, chronic kidney disease who was transferred from [facility] due to right hip fracture.He was seen because of hypotension and confusion earlier. Per family member, he is close to baseline MSnow, but per daughter and wife his alertness was decreased due to analgesics while [employer]. They state, that the patient did not eat or drink fluids for almost 2 days due to his drowsiness. He also had decreasedurine output as well. Earlier during the shift, he had hypotension that responded to 3 different normal saline boluses of 500 mL.   Creatinine  1.5 baseline   Some hemodynamic instability with BP < 80  Mm Hg    Urine output non oliguric  Bland urinalysis  U/S aorta  7 cm   EF 25 % No NSAIDS   ACE or ARB medications   Creatinine has improved with fluids and hydration and now undergoing hip fracture repair  Objective:  Vital signs in last 24 hours:  Temp:  [97.4 F (36.3 C)-98.3 F (36.8 C)] 98.2 F (36.8 C) (03/04 0600) Pulse Rate:  [32-86] 77 (03/04 0600) Resp:  [14-22] 15 (03/04 0600) BP: (84-118)/(43-70) 91/56 (03/04 0600) SpO2:  [96 %-100 %] 99 % (03/04 0600) Weight:  [181 lb 12.8 oz (82.5 kg)] 181 lb 12.8 oz (82.5 kg) (03/04 0331)  Weight change: 2 lb (0.907 kg) Filed Weights   04/14/16 2024 04/18/16 0021 04/19/16 0331  Weight: 172 lb 9.9 oz (78.3 kg) 179 lb 12.8 oz (81.6 kg) 181 lb 12.8 oz (82.5 kg)    Intake/Output: I/O last 3 completed shifts: In: 3801.4 [P.O.:1260; I.V.:2541.4] Out: 1126 [Urine:1125; Stool:1]   Intake/Output this shift:  Total I/O In: 335 [Blood:335] Out: -   CVS- RRR RS- CTA ABD- BS present soft non-distended EXT- no edema   Basic Metabolic Panel:  Recent Labs Lab 04/14/16 1609 04/15/16 0550 04/16/16 0613 04/18/16 0029 04/19/16 0201   NA 135 135 136 133* 133*  K 4.8 5.3* 5.6* 4.4 4.6  CL 104 106 104 105 108  CO2 23 24 23  19* 20*  GLUCOSE 120* 110* 123* 127* 110*  BUN 37* 38* 45* 67* 69*  CREATININE 1.74* 1.79* 2.26* 3.03* 2.91*  CALCIUM 8.9 8.3* 8.8* 8.1* 7.8*    Liver Function Tests:  Recent Labs Lab 04/14/16 1609 04/18/16 0029  AST 18 33  ALT 11* 11*  ALKPHOS 44 36*  BILITOT 0.6 0.8  PROT 6.8 5.6*  ALBUMIN 3.6 2.7*   No results for input(s): LIPASE, AMYLASE in the last 168 hours. No results for input(s): AMMONIA in the last 168 hours.  CBC:  Recent Labs Lab 04/14/16 1609 04/15/16 0550 04/18/16 0029 04/19/16 0201 04/19/16 0509  WBC 8.3 9.5 10.7* 7.7  --   NEUTROABS 4.5  --  7.4  --   --   HGB 12.5* 10.6* 9.5* 7.7* 8.8*  HCT 36.5* 31.5* 28.5* 22.8* 26.6*  MCV 99.7 100.6* 100.7* 100.0  --   PLT 152 137* 149* 140*  --     Cardiac Enzymes: No results for input(s): CKTOTAL, CKMB, CKMBINDEX, TROPONINI in the last 168 hours.  BNP: Invalid input(s): POCBNP  CBG:  Recent Labs Lab 04/14/16 1604  GLUCAP 117*    Microbiology: Results for orders placed or performed during the hospital encounter of 04/14/16  Surgical PCR screen     Status: Abnormal  Collection Time: 04/18/16 12:30 AM  Result Value Ref Range Status   MRSA, PCR NEGATIVE NEGATIVE Final   Staphylococcus aureus POSITIVE (A) NEGATIVE Final    Comment:        The Xpert SA Assay (FDA approved for NASAL specimens in patients over 35 years of age), is one component of a comprehensive surveillance program.  Test performance has been validated by [facility] for patients greater than or equal to 52 year old. It is not intended to diagnose infection nor to guide or monitor treatment.   Surgical pcr screen     Status: Abnormal   Collection Time: 04/18/16  9:14 PM  Result Value Ref Range Status   MRSA, PCR NEGATIVE NEGATIVE Final   Staphylococcus aureus POSITIVE (A) NEGATIVE Final    Comment:        The Xpert SA Assay  (FDA approved for NASAL specimens in patients over 75 years of age), is one component of a comprehensive surveillance program.  Test performance has been validated by [facility] for patients greater than or equal to 5 year old. It is not intended to diagnose infection nor to guide or monitor treatment.     Coagulation Studies:  Recent Labs  04/19/16 0819  LABPROT 15.8*  INR 1.26    Urinalysis:  Recent Labs  04/18/16 0620  COLORURINE YELLOW  LABSPEC 1.018  PHURINE 5.0  GLUCOSEU NEGATIVE  HGBUR NEGATIVE  BILIRUBINUR NEGATIVE  KETONESUR NEGATIVE  PROTEINUR NEGATIVE  NITRITE NEGATIVE  LEUKOCYTESUR NEGATIVE      Imaging: Ct Abdomen Pelvis Wo Contrast  Result Date: 04/19/2016 CLINICAL DATA:  Status post acute right hip fracture. Patient on anticoagulation for atrial fibrillation. Acute on chronic anemia. Assess for right hip hematoma or retroperitoneal hemorrhage. Initial encounter. EXAM: CT ABDOMEN AND PELVIS WITHOUT CONTRAST TECHNIQUE: Multidetector CT imaging of the abdomen and pelvis was performed following the standard protocol without IV contrast. COMPARISON:  CT of the abdomen and pelvis from 10/24/2014 FINDINGS: Lower chest: Trace bilateral pleural effusions are noted. Bilateral emphysema is seen, with underlying bibasilar fibrotic change. Diffuse coronary artery calcifications are seen. A pacemaker lead is partially imaged. Hepatobiliary: The liver is unremarkable in appearance. The gallbladder is unremarkable in appearance. The common bile duct remains normal in caliber. Pancreas: The pancreas is diffusely atrophic and grossly unremarkable. Spleen: The spleen is unremarkable in appearance. Adrenals/Urinary Tract: The adrenal glands are unremarkable in appearance. Nonspecific perinephric stranding is noted bilaterally. A small hyperdense cyst is noted at the anterior aspect of the right kidney. A small 3 mm calcification is noted at the upper pole of the right kidney.  There is no evidence of hydronephrosis. No obstructing ureteral stones are seen. Stomach/Bowel: The stomach is unremarkable in appearance. The small bowel is within normal limits. The appendix is not visualized; there is no evidence for appendicitis. The colon is unremarkable in appearance. Vascular/Lymphatic: There is aneurysmal dilatation of the infrarenal abdominal aorta to 6.4 cm in AP dimension and 6.5 cm in transverse dimension. Diffuse calcification is seen along the abdominal aorta and its branches, including along the superior mesenteric artery. Aneurysmal dilatation resolves at the aortic bifurcation. There is diffuse calcification along the common iliac arteries, external and internal iliac arteries, and common femoral arteries bilaterally. No retroperitoneal or pelvic sidewall lymphadenopathy is seen. Reproductive: The bladder is mildly distended and grossly unremarkable. The prostate is enlarged, measuring 7.2 cm in transverse dimension. Other: Mild diffuse soft tissue hemorrhage is seen tracking along the right thigh and right  hip, without a well-defined hematoma. Musculoskeletal: There is a comminuted right femoral intertrochanteric fracture, with a displaced lesser trochanteric fragment. Mild intramuscular hemorrhage is seen along the proximal right thigh. Multilevel vacuum phenomenon is noted along the lower thoracic and lumbar spine. IMPRESSION: 1. Comminuted right femoral intertrochanteric fracture, with a displaced lesser trochanteric fragment. 2. Mild intramuscular hemorrhage along the proximal right thigh, with mild diffuse overlying soft tissue hemorrhage. No well-defined hematoma seen. 3. Interval increase in size of large infrarenal abdominal aortic aneurysm, measuring 6.4 cm in AP dimension and 6.5 cm in transverse dimension. Vascular surgery consultation recommended due to increased risk of rupture for AAA >5.5 cm. This recommendation follows ACR consensus guidelines: White Paper of the  ACR Incidental Findings Committee II on Vascular Findings. J Am Coll Radiol 2013; 10:789-794. 4. Diffuse aortic atherosclerosis. Diffuse calcification along the superior mesenteric artery, common iliac arteries, external and internal iliac arteries, and common femoral arteries bilaterally. 5. Markedly enlarged prostate.  Would correlate with PSA. 6. Trace bilateral pleural effusions. Bilateral emphysema, with underlying bibasilar fibrotic change. 7. Diffuse coronary artery calcifications seen. 8. Small 3 mm nonobstructing stone at the upper pole of the right kidney. Small hyperdense right renal cyst noted. These results were called by telephone at the time of interpretation on 04/19/2016 at 6:11 am to [facility], who verbally acknowledged these results. Electronically Signed   By: Roanna Raider M.D.   On: 04/19/2016 06:13   Dg Chest Port 1 View  Result Date: 04/17/2016 CLINICAL DATA:  Acute onset of respiratory distress. Initial encounter. EXAM: PORTABLE CHEST 1 VIEW COMPARISON:  Chest radiograph performed 04/14/2016 FINDINGS: The lungs are well-aerated. Bibasilar airspace opacities raise concern for pneumonia, though mild interstitial edema could have a similar appearance. No definite pleural effusion or pneumothorax is seen. The cardiomediastinal silhouette is mildly enlarged. A pacemaker is noted overlying the left chest wall, with leads ending overlying the right atrium and right ventricle. No acute osseous abnormalities are seen. IMPRESSION: 1. Bibasilar airspace opacities raise concern for pneumonia, though mild interstitial edema could have a similar appearance. 2. Mild cardiomegaly. Electronically Signed   By: Roanna Raider M.D.   On: 04/17/2016 23:46   Dg Abd Portable 2v  Result Date: 04/17/2016 CLINICAL DATA:  Abdominal distention. EXAM: PORTABLE ABDOMEN - 2 VIEW COMPARISON:  CT abdomen and pelvis 10/24/2014. FINDINGS: No free intraperitoneal air is identified. There is mild gaseous distention  of small and large bowel diffusely. Prominent stool burden ascending colon noted. IMPRESSION: Bowel gas pattern most in keeping with ileus. Large stool burden ascending colon. Electronically Signed   By: Drusilla Kanner M.D.   On: 04/17/2016 12:11     Medications:   . sodium chloride 10 mL (04/19/16 0440)   . sodium chloride  10 mL/hr Intravenous Once  . [MAR Hold] amiodarone  100 mg Oral Daily  . [MAR Hold] atorvastatin  40 mg Oral q1800  . [MAR Hold] Chlorhexidine Gluconate Cloth  6 each Topical Daily  . [MAR Hold] famotidine  20 mg Oral QPM  . [MAR Hold] feeding supplement  1 Container Oral TID BM  . [MAR Hold] finasteride  5 mg Oral QPM  . [MAR Hold] levothyroxine  75 mcg Oral QAC breakfast  . [MAR Hold] multivitamin with minerals  1 tablet Oral Daily  . [MAR Hold] mupirocin ointment  1 application Nasal BID  . povidone-iodine  2 application Topical Once  . [MAR Hold] senna-docusate  1 tablet Oral BID  . [facility] Hold]  sodium chloride  500 mL Intravenous Once   0.9 % irrigation (POUR BTL), [MAR Hold] HYDROcodone-acetaminophen, [MAR Hold] methocarbamol **OR** [MAR Hold] methocarbamol (ROBAXIN)  IV, [MAR Hold]  morphine injection, [MAR Hold] ondansetron (ZOFRAN) IV, [MAR Hold] ondansetron, [MAR Hold] polyethylene glycol  Assessment/ Plan:   Acute on chronic renal failure in setting of hypotension and Rt Hip fracture  - antihypertensives have been stopped  He is non oliguric and responding to IV fluids. This appears to be ischemic ATN with poor renal perfusion. I would be cautious with volume management as he may develop pulmonary edema , he does not want dialysis.  Continue IV fluids post operative   DNR status : very challenging situation as patient states that he would not want to live being unable to walk again. There is high risk of needing dialysis post op . The patient is clear in front of wife and step son  He does not want dialysis    LOS: 5 Fatou Dunnigan W @TODAY @10 :10 AM

## 2016-04-19 NOTE — Progress Notes (Signed)
Subjective:  Doing well denies any chest pain or shortness of breath. Tolerated intramedullary nail right hip. Urine output improved  Objective:  Vital Signs in the last 24 hours: Temp:  [97.2 F (36.2 C)-98.3 F (36.8 C)] 97.2 F (36.2 C) (03/04 1130) Pulse Rate:  [32-87] 83 (03/04 1400) Resp:  [12-22] 15 (03/04 1400) BP: (84-137)/(43-84) 106/70 (03/04 1400) SpO2:  [82 %-100 %] 98 % (03/04 1400) Weight:  [181 lb 12.8 oz (82.5 kg)] 181 lb 12.8 oz (82.5 kg) (03/04 0331)  Intake/Output from previous day: 03/03 0701 - 03/04 0700 In: 3713.9 [P.O.:1260; I.V.:2453.9] Out: 851 [Urine:850; Stool:1] Intake/Output from this shift: Total I/O In: 2485 [I.V.:1000; Blood:335; Other:900; IV Piggyback:250] Out: 625 [Urine:475; Blood:150]  Physical Exam: Neck: no adenopathy, no carotid bruit, no JVD and supple, symmetrical, trachea midline Lungs: Clear to auscultation anterolaterally Heart: irregularly irregular rhythm, S1, S2 normal and 2/6 systolic murmur noted Abdomen: soft, non-tender; bowel sounds normal; no masses,  no organomegaly Extremities: extremities normal, atraumatic, no cyanosis or edema  Lab Results:  Recent Labs  04/18/16 0029 04/19/16 0201 04/19/16 0509  WBC 10.7* 7.7  --   HGB 9.5* 7.7* 8.8*  PLT 149* 140*  --     Recent Labs  04/18/16 0029 04/19/16 0201  NA 133* 133*  K 4.4 4.6  CL 105 108  CO2 19* 20*  GLUCOSE 127* 110*  BUN 67* 69*  CREATININE 3.03* 2.91*   No results for input(s): TROPONINI in the last 72 hours.  Invalid input(s): CK, MB Hepatic Function Panel  Recent Labs  04/18/16 0029  PROT 5.6*  ALBUMIN 2.7*  AST 33  ALT 11*  ALKPHOS 36*  BILITOT 0.8   No results for input(s): CHOL in the last 72 hours. No results for input(s): PROTIME in the last 72 hours.  Imaging: Imaging results have been reviewed and Ct Abdomen Pelvis Wo Contrast  Result Date: 04/19/2016 CLINICAL DATA:  Status post acute right hip fracture. Patient on  anticoagulation for atrial fibrillation. Acute on chronic anemia. Assess for right hip hematoma or retroperitoneal hemorrhage. Initial encounter. EXAM: CT ABDOMEN AND PELVIS WITHOUT CONTRAST TECHNIQUE: Multidetector CT imaging of the abdomen and pelvis was performed following the standard protocol without IV contrast. COMPARISON:  CT of the abdomen and pelvis from 10/24/2014 FINDINGS: Lower chest: Trace bilateral pleural effusions are noted. Bilateral emphysema is seen, with underlying bibasilar fibrotic change. Diffuse coronary artery calcifications are seen. A pacemaker lead is partially imaged. Hepatobiliary: The liver is unremarkable in appearance. The gallbladder is unremarkable in appearance. The common bile duct remains normal in caliber. Pancreas: The pancreas is diffusely atrophic and grossly unremarkable. Spleen: The spleen is unremarkable in appearance. Adrenals/Urinary Tract: The adrenal glands are unremarkable in appearance. Nonspecific perinephric stranding is noted bilaterally. A small hyperdense cyst is noted at the anterior aspect of the right kidney. A small 3 mm calcification is noted at the upper pole of the right kidney. There is no evidence of hydronephrosis. No obstructing ureteral stones are seen. Stomach/Bowel: The stomach is unremarkable in appearance. The small bowel is within normal limits. The appendix is not visualized; there is no evidence for appendicitis. The colon is unremarkable in appearance. Vascular/Lymphatic: There is aneurysmal dilatation of the infrarenal abdominal aorta to 6.4 cm in AP dimension and 6.5 cm in transverse dimension. Diffuse calcification is seen along the abdominal aorta and its branches, including along the superior mesenteric artery. Aneurysmal dilatation resolves at the aortic bifurcation. There is diffuse calcification along the common  iliac arteries, external and internal iliac arteries, and common femoral arteries bilaterally. No retroperitoneal or  pelvic sidewall lymphadenopathy is seen. Reproductive: The bladder is mildly distended and grossly unremarkable. The prostate is enlarged, measuring 7.2 cm in transverse dimension. Other: Mild diffuse soft tissue hemorrhage is seen tracking along the right thigh and right hip, without a well-defined hematoma. Musculoskeletal: There is a comminuted right femoral intertrochanteric fracture, with a displaced lesser trochanteric fragment. Mild intramuscular hemorrhage is seen along the proximal right thigh. Multilevel vacuum phenomenon is noted along the lower thoracic and lumbar spine. IMPRESSION: 1. Comminuted right femoral intertrochanteric fracture, with a displaced lesser trochanteric fragment. 2. Mild intramuscular hemorrhage along the proximal right thigh, with mild diffuse overlying soft tissue hemorrhage. No well-defined hematoma seen. 3. Interval increase in size of large infrarenal abdominal aortic aneurysm, measuring 6.4 cm in AP dimension and 6.5 cm in transverse dimension. Vascular surgery consultation recommended due to increased risk of rupture for AAA >5.5 cm. This recommendation follows ACR consensus guidelines: White Paper of the ACR Incidental Findings Committee II on Vascular Findings. J Am Coll Radiol 2013; 10:789-794. 4. Diffuse aortic atherosclerosis. Diffuse calcification along the superior mesenteric artery, common iliac arteries, external and internal iliac arteries, and common femoral arteries bilaterally. 5. Markedly enlarged prostate.  Would correlate with PSA. 6. Trace bilateral pleural effusions. Bilateral emphysema, with underlying bibasilar fibrotic change. 7. Diffuse coronary artery calcifications seen. 8. Small 3 mm nonobstructing stone at the upper pole of the right kidney. Small hyperdense right renal cyst noted. These results were called by telephone at the time of interpretation on 04/19/2016 at 6:11 am to [facility] on [facility], who verbally acknowledged these results. Electronically  Signed   By: Roanna Raider M.D.   On: 04/19/2016 06:13   Dg Chest Port 1 View  Result Date: 04/17/2016 CLINICAL DATA:  Acute onset of respiratory distress. Initial encounter. EXAM: PORTABLE CHEST 1 VIEW COMPARISON:  Chest radiograph performed 04/14/2016 FINDINGS: The lungs are well-aerated. Bibasilar airspace opacities raise concern for pneumonia, though mild interstitial edema could have a similar appearance. No definite pleural effusion or pneumothorax is seen. The cardiomediastinal silhouette is mildly enlarged. A pacemaker is noted overlying the left chest wall, with leads ending overlying the right atrium and right ventricle. No acute osseous abnormalities are seen. IMPRESSION: 1. Bibasilar airspace opacities raise concern for pneumonia, though mild interstitial edema could have a similar appearance. 2. Mild cardiomegaly. Electronically Signed   By: Roanna Raider M.D.   On: 04/17/2016 23:46   Dg C-arm 1-60 Min  Result Date: 04/19/2016 CLINICAL DATA:  Right femur fracture EXAM: DG C-ARM 61-120 MIN; RIGHT FEMUR 2 VIEWS COMPARISON:  04/14/2016 FINDINGS: Fluoroscopy shows intramedullary nail and dynamic hip screw fixation of an intertrochanteric femur fracture. Fracture alignment is significantly improved. The lesser trochanter fragment remains distracted. No evidence of intraoperative fracture. IMPRESSION: Fluoroscopy for intertrochanteric femur fracture fixation. No unexpected finding. Electronically Signed   By: Marnee Spring M.D.   On: 04/19/2016 13:38   Dg Femur, Min 2 Views Right  Result Date: 04/19/2016 CLINICAL DATA:  Right femur fracture EXAM: DG C-ARM 61-120 MIN; RIGHT FEMUR 2 VIEWS COMPARISON:  04/14/2016 FINDINGS: Fluoroscopy shows intramedullary nail and dynamic hip screw fixation of an intertrochanteric femur fracture. Fracture alignment is significantly improved. The lesser trochanter fragment remains distracted. No evidence of intraoperative fracture. IMPRESSION: Fluoroscopy for  intertrochanteric femur fracture fixation. No unexpected finding. Electronically Signed   By: Marnee Spring M.D.   On: 04/19/2016 13:38  Cardiac Studies:  Assessment/Plan:  Comminuted right  trochanteric femoral neck fracture, status post fall status post intramedullary nail right hip doing well Multivessel coronary artery disease, history of silent inferior wall MI and remote past, status post multivessel PCI to LAD, left circumflex and obtuse marginal 3 in November 2013. Ischemic cardiomyopathy. Tachybradycardia syndrome status post permanent pacemaker in the past History of congestive heart failure secondary to depressed LV systolic function. Critical abdominal aortic aneurysm.being [facility] at [facility] Chronic atrial fibrillation chads vasc score of 5 on chronic anticoagulation Acute on chronic kidney disease stage III./Hypovolemia secondary to hypotension and hypovolemia and ACE inhibitor Chronic anemia Hypotension Hyperlipidemia. Hypothyroidism. Degenerative joint disease. Protein calorie malnutrition Plan Continue present management Encouraged  by mouth fluids Encouraged  feeding supplements boost.   LOS: 5 days    Connor Diaz 04/19/2016, 4:04 PM

## 2016-04-19 NOTE — Anesthesia Procedure Notes (Signed)
Spinal  Patient location during procedure: OR Start time: 04/19/2016 9:18 AM End time: 04/19/2016 9:27 AM Staffing Anesthesiologist: Val Eagle Preanesthetic Checklist Completed: patient identified, surgical consent, pre-op evaluation, timeout performed, IV checked, risks and benefits discussed and monitors and equipment checked Spinal Block Patient position: right lateral decubitus Prep: site prepped and draped and DuraPrep Patient monitoring: heart rate, cardiac monitor, continuous pulse ox and blood pressure Approach: midline Location: L4-5 Injection technique: single-shot Needle Needle type: Pencan  Needle gauge: 24 G Needle length: 10 cm Assessment Sensory level: T6

## 2016-04-19 NOTE — H&P (View-Only) (Signed)
I spoke with his family again today. Given his increasing creatinine decreasing renal function and dehydration I spoke with the hospitalist with fillet be safer to delay his surgery for a day to allow for better hydration. Family is in agreement with this. We will keep him nothing by mouth at midnight tonight and plan for OR Sunday AM   Niyanna Asch D  

## 2016-04-19 NOTE — Progress Notes (Signed)
Md aware of CT results and HGB 7.7.  No new orders at this time.  Report called to OR.  Family at bedside.  will transport via bed.  Will continue to monitor. Karena Addison T .

## 2016-04-19 NOTE — Progress Notes (Signed)
[facility]                                                                              Patient Demographics  Connor Diaz, is a 83 y.o. male, DOB - 1924/02/03, ZHY:[id number]  Admit date - 04/14/2016   Admitting Physician Connor Blue, MD  Outpatient Primary MD for the patient is Connor Stalling, MD  Outpatient specialists:   LOS - 5  days    Chief Complaint  Patient presents with  . Dizziness  . Fall       Brief summary   Patient is a 83 year old male with medical history significant of AAA; afib, on Eliquis; BPH; CKD stage 3; HTN; hypothyroidism; bradycardia s/p pacemaker placement; and CHF (EF 20-25% in 8/17) who originally presented to [facility] with right hip fracture on 04/14/16 after a mechanical fall. Patient was seen by orthopedics, Connor Diaz however due to his significant cardiac history, cardiologist (Connor Diaz) and other medical issues, Connor Diaz recommended transfer to [facility] for orthopedics surgery and medical management.   Assessment & Plan    Principal Problem:  Right Hip fracture [facility]) - Patient transferred from [facility] due to significant cardiac history - 2-D echo 2/28 showed EF of 25-30% with diffuse hypokinesis in the basal and mid inferolateral myocardium - patient seen in PACU, after surgery, doing well.  - restart  heparin drip or anticoagulation when cleared by ortho  Active Problems:   AAA/ Aneurysm of abdominal vessel (HCC) - Ultrasound abdominal done, showed Interval growth of an infrarenal abdominal aortic aneurysm which measures 6.7 cm AP x 7.2 cm transversely. - Patient was seen on 05/22/15 in [facility], seen by Connor Diaz, at that time AAA measured 6.1 x 6.2 cm, patient was recommended to follow up. Another note on 06/24/15 stated that he was not interested in moving forward with surgery due to potential impact on his quality of life - Vasc surgery consulted, Connor Diaz  discussed in detail with patient and wife in detail, recommended outpatient electively follow up with Connor. Pattricia Diaz at [facility] if he recovers from his current acute medical problems, if patient has symptoms of rupture during the hospital stay, would consider open repair     Non-ischemic cardiomyopathy [facility]), Paroxysmal atrial fibrillation (HCC) - Rate controlled, with pacer, on amiodarone - Currently, eliquis on hold, CHADvasc score 5, heparin drip on hold for now.      Abdominal distention  - better, tolerating solid diet    Acute on CKD (chronic kidney disease) stage 3, GFR 30-59 ml/min - Baseline creatinine ~1.7 - Per family, has not been eating well in the last few days - nephrology consult obtained, recommended increasing IV fluids, and monitor signs for any volume overload. Patient did not want to any dialysis.  Anemia  - Hb 8.8, Stat CT abd did not show any hematoma or hemorrhage, follow closely   Code Status: DNR  DVT Prophylaxis:  Heparin gtt on hold  Family Communication: Discussed in detail with the patient, all imaging results, lab results explained to the patient   Disposition Plan:   Time Spent in  minutes   25 minutes  Procedures:  2-D echo  Consultants:   Cardiology Orthopedics  nephrology Vascular surgery  Antimicrobials:      Medications  Scheduled Meds: . sodium chloride  10 mL/hr Intravenous Once  . amiodarone  100 mg Oral Daily  . atorvastatin  40 mg Oral q1800  . Chlorhexidine Gluconate Cloth  6 each Topical Daily  . famotidine  20 mg Oral QPM  . feeding supplement  1 Container Oral TID BM  . finasteride  5 mg Oral QPM  . levothyroxine  75 mcg Oral QAC breakfast  . multivitamin with minerals  1 tablet Oral Daily  . mupirocin ointment  1 application Nasal BID  . senna-docusate  1 tablet Oral BID  . sodium chloride  500 mL Intravenous Once   Continuous Infusions: . sodium chloride 10 mL (04/19/16 0440)   PRN  Meds:.HYDROcodone-acetaminophen, methocarbamol **OR** methocarbamol (ROBAXIN)  IV, morphine injection, ondansetron (ZOFRAN) IV, ondansetron, polyethylene glycol   Antibiotics   Anti-infectives    Start     Dose/Rate Route Frequency Ordered Stop   04/19/16 0600  ceFAZolin (ANCEF) IVPB 2g/100 mL premix     2 g 200 mL/hr over 30 Minutes Intravenous On call to O.R. 04/18/16 1939 04/19/16 0956        Subjective:   Connor Diaz was seen and examined today in PACU after surgery. Feeling well, right hip pain, otherwise stable. No abdominal pain.  No nausea or vomiting.  Patient denies dizziness,  new weakness, numbess, tingling.   Objective:   Vitals:   04/19/16 1215 04/19/16 1230 04/19/16 1245 04/19/16 1300  BP: 110/83 123/77 119/75 118/63  Pulse: 70 71 80 77  Resp: 18 14 15 15   Temp:      TempSrc:      SpO2: 99% (!) 82% 99% 99%  Weight:      Height:        Intake/Output Summary (Last 24 hours) at 04/19/16 1336 Last data filed at 04/19/16 1116  Gross per 24 hour  Intake          4467.09 ml  Output             1475 ml  Net          2992.09 ml     Wt Readings from Last 3 Encounters:  04/19/16 82.5 kg (181 lb 12.8 oz)  04/14/16 79 kg (174 lb 1.6 oz)  10/23/15 68 kg (150 lb)     Exam  General: Alert and oriented x 3, NAD  HEENT:    Neck: Supple, no JVD  Cardiovascular: S1 S2 clear, irregularly regular   Respiratory: CTAB  Gastrointestinal: Soft, nontender, Mildly distended , + bowel sounds  Ext: no cyanosis clubbing or edema  Neuro:no new deficits   Skin: No rashes  Psych: Normal affect and demeanor, alert and oriented x3    Data Reviewed:  I have personally reviewed following labs and imaging studies  Micro Results Recent Results (from the past 240 hour(s))  Surgical PCR screen     Status: Abnormal   Collection Time: 04/18/16 12:30 AM  Result Value Ref Range Status   MRSA, PCR NEGATIVE NEGATIVE Final   Staphylococcus aureus POSITIVE (A)  NEGATIVE Final    Comment:        The Xpert SA Assay (FDA approved for NASAL specimens in patients over 36 years of age), is one component of a comprehensive surveillance program.  Test performance has been validated by [facility] for  patients greater than or equal to 41 year old. It is not intended to diagnose infection nor to guide or monitor treatment.   Surgical pcr screen     Status: Abnormal   Collection Time: 04/18/16  9:14 PM  Result Value Ref Range Status   MRSA, PCR NEGATIVE NEGATIVE Final   Staphylococcus aureus POSITIVE (A) NEGATIVE Final    Comment:        The Xpert SA Assay (FDA approved for NASAL specimens in patients over 53 years of age), is one component of a comprehensive surveillance program.  Test performance has been validated by [facility] for patients greater than or equal to 35 year old. It is not intended to diagnose infection nor to guide or monitor treatment.     Radiology Reports Ct Abdomen Pelvis Wo Contrast  Result Date: 04/19/2016 CLINICAL DATA:  Status post acute right hip fracture. Patient on anticoagulation for atrial fibrillation. Acute on chronic anemia. Assess for right hip hematoma or retroperitoneal hemorrhage. Initial encounter. EXAM: CT ABDOMEN AND PELVIS WITHOUT CONTRAST TECHNIQUE: Multidetector CT imaging of the abdomen and pelvis was performed following the standard protocol without IV contrast. COMPARISON:  CT of the abdomen and pelvis from 10/24/2014 FINDINGS: Lower chest: Trace bilateral pleural effusions are noted. Bilateral emphysema is seen, with underlying bibasilar fibrotic change. Diffuse coronary artery calcifications are seen. A pacemaker lead is partially imaged. Hepatobiliary: The liver is unremarkable in appearance. The gallbladder is unremarkable in appearance. The common bile duct remains normal in caliber. Pancreas: The pancreas is diffusely atrophic and grossly unremarkable. Spleen: The spleen is unremarkable in  appearance. Adrenals/Urinary Tract: The adrenal glands are unremarkable in appearance. Nonspecific perinephric stranding is noted bilaterally. A small hyperdense cyst is noted at the anterior aspect of the right kidney. A small 3 mm calcification is noted at the upper pole of the right kidney. There is no evidence of hydronephrosis. No obstructing ureteral stones are seen. Stomach/Bowel: The stomach is unremarkable in appearance. The small bowel is within normal limits. The appendix is not visualized; there is no evidence for appendicitis. The colon is unremarkable in appearance. Vascular/Lymphatic: There is aneurysmal dilatation of the infrarenal abdominal aorta to 6.4 cm in AP dimension and 6.5 cm in transverse dimension. Diffuse calcification is seen along the abdominal aorta and its branches, including along the superior mesenteric artery. Aneurysmal dilatation resolves at the aortic bifurcation. There is diffuse calcification along the common iliac arteries, external and internal iliac arteries, and common femoral arteries bilaterally. No retroperitoneal or pelvic sidewall lymphadenopathy is seen. Reproductive: The bladder is mildly distended and grossly unremarkable. The prostate is enlarged, measuring 7.2 cm in transverse dimension. Other: Mild diffuse soft tissue hemorrhage is seen tracking along the right thigh and right hip, without a well-defined hematoma. Musculoskeletal: There is a comminuted right femoral intertrochanteric fracture, with a displaced lesser trochanteric fragment. Mild intramuscular hemorrhage is seen along the proximal right thigh. Multilevel vacuum phenomenon is noted along the lower thoracic and lumbar spine. IMPRESSION: 1. Comminuted right femoral intertrochanteric fracture, with a displaced lesser trochanteric fragment. 2. Mild intramuscular hemorrhage along the proximal right thigh, with mild diffuse overlying soft tissue hemorrhage. No well-defined hematoma seen. 3. Interval  increase in size of large infrarenal abdominal aortic aneurysm, measuring 6.4 cm in AP dimension and 6.5 cm in transverse dimension. Vascular surgery consultation recommended due to increased risk of rupture for AAA >5.5 cm. This recommendation follows ACR consensus guidelines: White Paper of the ACR Incidental Findings Committee II on Vascular  Findings. J Am Coll Radiol 2013; 10:789-794. 4. Diffuse aortic atherosclerosis. Diffuse calcification along the superior mesenteric artery, common iliac arteries, external and internal iliac arteries, and common femoral arteries bilaterally. 5. Markedly enlarged prostate.  Would correlate with PSA. 6. Trace bilateral pleural effusions. Bilateral emphysema, with underlying bibasilar fibrotic change. 7. Diffuse coronary artery calcifications seen. 8. Small 3 mm nonobstructing stone at the upper pole of the right kidney. Small hyperdense right renal cyst noted. These results were called by telephone at the time of interpretation on 04/19/2016 at 6:11 am to [facility] on [facility] [place], who verbally acknowledged these results. Electronically Signed   By: Roanna Raider M.D.   On: 04/19/2016 06:13   Dg Chest 1 View  Result Date: 04/14/2016 CLINICAL DATA:  Dizziness.  Fall. EXAM: CHEST 1 VIEW FINDINGS: Cardiac pacer with lead tips over the right atrium right ventricle. Cardiomegaly. Mild pulmonary vascular prominence with mild interstitial prominence. Mild CHF cannot be excluded. Underlying chronic interstitial lung disease most likely present. Small right pleural effusion cannot be excluded. Left costophrenic angle not imaged. No pneumothorax. No acute bony abnormalities. Degenerative changes both shoulders. IMPRESSION: 1. Cardiac pacer stable position. Cardiomegaly with mild bilateral from interstitial prominence suggesting mild CHF. Tiny right pleural effusion cannot be excluded. 2. Underlying chronic interstitial lung disease most likely present . Electronically Signed   By: Maisie Fus   Register   On: 04/14/2016 17:00   Dg Knee 1-2 Views Right  Result Date: 04/14/2016 CLINICAL DATA:  Fall. EXAM: RIGHT KNEE - 1-2 VIEW COMPARISON:  04/14/2016. FINDINGS: Soft tissue swelling. Small effusion cannot be excluded. Severe tricompartment degenerative change in osteopenia. Subtle fracture, age undetermined, of the superior aspect of patella cannot be excluded. Bipartite patella could also present in this fashion. Peripheral vascular calcification . IMPRESSION: 1. Soft tissue swelling. Small effusion cannot be excluded. Subtle fracture, age undetermined, superior aspect of the patella. Bipartite patella could also present in this fashion. 2. Diffuse osteopenia and severe degenerative change. 3. Peripheral vascular disease. Electronically Signed   By: Maisie Fus  Register   On: 04/14/2016 17:02   US Aorta  Result Date: 04/15/2016 CLINICAL DATA:  Follow-up of an abdominal aortic aneurysm measuring 5.6 x 5.7 cm seen on CT angiogram of the abdomen pelvis of October 24, 2014 EXAM: ULTRASOUND OF ABDOMINAL AORTA TECHNIQUE: Ultrasound examination of the abdominal aorta was performed to evaluate for abdominal aortic aneurysm. COMPARISON:  CT angiogram of October 24, 2014 FINDINGS: Abdominal Aorta The proximal and mid aorta are obscured by bowel gas. There is an infrarenal abdominal aortic aneurysm measuring 6.7 cm AP x 7.2 cm transversely. There is considerable mural plaque and thrombus. The luminal diameter is approximately 3.5 cm. The aneurysm is 9.2 cm in length. The right common iliac artery measures 1.6 x 1.8 cm. The left common iliac artery measures 2.1 x 1.7 cm. Maximum Diameter: 7.2 cm in the transverse plane. IMPRESSION: Interval growth of an infrarenal abdominal aortic aneurysm which measures 6.7 cm AP x 7.2 cm transversely. It is 9.2 cm in length and extends into the left common iliac artery. The proximal and mid aorta could not be imaged due to bowel gas and the patient's clinical condition which  limited changes in position. These results will be called to the ordering clinician or representative by the Radiologist Assistant, and communication documented in the PACS or zVision Dashboard. Electronically Signed   By: David  [place] M.D.   On: 04/15/2016 13:42   Dg Chest Port 1 View  Result Date: 04/17/2016  CLINICAL DATA:  Acute onset of respiratory distress. Initial encounter. EXAM: PORTABLE CHEST 1 VIEW COMPARISON:  Chest radiograph performed 04/14/2016 FINDINGS: The lungs are well-aerated. Bibasilar airspace opacities raise concern for pneumonia, though mild interstitial edema could have a similar appearance. No definite pleural effusion or pneumothorax is seen. The cardiomediastinal silhouette is mildly enlarged. A pacemaker is noted overlying the left chest wall, with leads ending overlying the right atrium and right ventricle. No acute osseous abnormalities are seen. IMPRESSION: 1. Bibasilar airspace opacities raise concern for pneumonia, though mild interstitial edema could have a similar appearance. 2. Mild cardiomegaly. Electronically Signed   By: Roanna Raider M.D.   On: 04/17/2016 23:46   Dg Abd Portable 2v  Result Date: 04/17/2016 CLINICAL DATA:  Abdominal distention. EXAM: PORTABLE ABDOMEN - 2 VIEW COMPARISON:  CT abdomen and pelvis 10/24/2014. FINDINGS: No free intraperitoneal air is identified. There is mild gaseous distention of small and large bowel diffusely. Prominent stool burden ascending colon noted. IMPRESSION: Bowel gas pattern most in keeping with ileus. Large stool burden ascending colon. Electronically Signed   By: Drusilla Kanner M.D.   On: 04/17/2016 12:11   Dg Hip Unilat W Or Wo Pelvis 2-3 Views Right  Result Date: 04/14/2016 CLINICAL DATA:  Fall today with right hip pain and deformity EXAM: DG HIP (WITH OR WITHOUT PELVIS) 2-3V RIGHT COMPARISON:  None. FINDINGS: Bones are diffusely demineralized. Comminuted intertrochanteric right femoral neck fracture noted with varus  angulation. Lesser trochanter exists as a free fragment. SI joints and symphysis pubis unremarkable. Distal aortic and iliac artery atherosclerosis evident. IMPRESSION: 1. Comminuted intertrochanteric right femoral neck fracture with varus angulation. 2.  Abdominal Aortic Atherosclerois (ICD10-170.0) Electronically Signed   By: Kennith Center M.D.   On: 04/14/2016 17:01    Lab Data:  CBC:  Recent Labs Lab 04/14/16 1609 04/15/16 0550 04/18/16 0029 04/19/16 0201 04/19/16 0509  WBC 8.3 9.5 10.7* 7.7  --   NEUTROABS 4.5  --  7.4  --   --   HGB 12.5* 10.6* 9.5* 7.7* 8.8*  HCT 36.5* 31.5* 28.5* 22.8* 26.6*  MCV 99.7 100.6* 100.7* 100.0  --   PLT 152 137* 149* 140*  --    Basic Metabolic Panel:  Recent Labs Lab 04/14/16 1609 04/15/16 0550 04/16/16 0613 04/18/16 0029 04/19/16 0201  NA 135 135 136 133* 133*  K 4.8 5.3* 5.6* 4.4 4.6  CL 104 106 104 105 108  CO2 23 24 23  19* 20*  GLUCOSE 120* 110* 123* 127* 110*  BUN 37* 38* 45* 67* 69*  CREATININE 1.74* 1.79* 2.26* 3.03* 2.91*  CALCIUM 8.9 8.3* 8.8* 8.1* 7.8*   GFR: Estimated Creatinine Clearance: 18 mL/min (by C-G formula based on SCr of 2.91 mg/dL (H)). Liver Function Tests:  Recent Labs Lab 04/14/16 1609 04/18/16 0029  AST 18 33  ALT 11* 11*  ALKPHOS 44 36*  BILITOT 0.6 0.8  PROT 6.8 5.6*  ALBUMIN 3.6 2.7*   No results for input(s): LIPASE, AMYLASE in the last 168 hours. No results for input(s): AMMONIA in the last 168 hours. Coagulation Profile:  Recent Labs Lab 04/19/16 0819  INR 1.26   Cardiac Enzymes: No results for input(s): CKTOTAL, CKMB, CKMBINDEX, TROPONINI in the last 168 hours. BNP (last 3 results) No results for input(s): PROBNP in the last 8760 hours. HbA1C: No results for input(s): HGBA1C in the last 72 hours. CBG:  Recent Labs Lab 04/14/16 1604  GLUCAP 117*   Lipid Profile: No results for input(s): CHOL,  HDL, LDLCALC, TRIG, CHOLHDL, LDLDIRECT in the last 72 hours. Thyroid Function  Tests: No results for input(s): TSH, T4TOTAL, FREET4, T3FREE, THYROIDAB in the last 72 hours. Anemia Panel: No results for input(s): VITAMINB12, FOLATE, FERRITIN, TIBC, IRON, RETICCTPCT in the last 72 hours. Urine analysis:    Component Value Date/Time   COLORURINE YELLOW 04/18/2016 0620   APPEARANCEUR CLEAR 04/18/2016 0620   LABSPEC 1.018 04/18/2016 0620   PHURINE 5.0 04/18/2016 0620   GLUCOSEU NEGATIVE 04/18/2016 0620   HGBUR NEGATIVE 04/18/2016 0620   BILIRUBINUR NEGATIVE 04/18/2016 0620   KETONESUR NEGATIVE 04/18/2016 0620   PROTEINUR NEGATIVE 04/18/2016 0620   NITRITE NEGATIVE 04/18/2016 0620   LEUKOCYTESUR NEGATIVE 04/18/2016 1610     Pama Roskos M.D. [facility] 04/19/2016, 1:36 PM  Pager: [phone] Between 7am to 7pm - call Pager - [phone]  After 7pm go to [email or web address] - password [email or web address]  Call night coverage person covering after 7pm

## 2016-04-19 NOTE — Op Note (Signed)
DATE OF SURGERY:  04/19/2016  TIME: 10:21 AM  PATIENT NAME:  Connor Diaz  AGE: 83 y.o.  PRE-OPERATIVE DIAGNOSIS:  RIGHT HIP FRACTURE  POST-OPERATIVE DIAGNOSIS:  SAME  PROCEDURE:  INTRAMEDULLARY (IM) NAIL INTERTROCHANTRIC  SURGEON:  Cyrene Gharibian D  ASSISTANT:  Aquilla Hacker, PA-C, he was present and scrubbed throughout the case, critical for completion in a timely fashion, and for retraction, instrumentation, and closure.   OPERATIVE IMPLANTS: Stryker Gamma Nail  PREOPERATIVE INDICATIONS:  [name] is a 83 y.o. year old who fell and suffered a hip fracture. He was brought into the ER and then admitted and optimized and then elected for surgical intervention.    The risks benefits and alternatives were discussed with the patient including but not limited to the risks of nonoperative treatment, versus surgical intervention including infection, bleeding, nerve injury, malunion, nonunion, hardware prominence, hardware failure, need for hardware removal, blood clots, cardiopulmonary complications, morbidity, mortality, among others, and they were willing to proceed.    OPERATIVE PROCEDURE:  The patient was brought to the operating room and placed in the supine position. General anesthesia was administered. He was placed on the fracture table.  Closed reduction was performed under C-arm guidance. Time out was then performed after sterile prep and drape. He received preoperative antibiotics.  Incision was made proximal to the greater trochanter. A guidewire was placed in the appropriate position. Confirmation was made on AP and lateral views. The above-named nail was opened. I opened the proximal femur with a reamer. I then placed the nail by hand easily down. I did not need to ream the femur.  Once the nail was completely seated, I placed a guidepin into the femoral head into the center center position. I measured the length, and then reamed the lateral cortex and up into  the head. I then placed the lag screw. Slight compression was applied. Anatomic fixation achieved. Bone quality was mediocre.  I then secured the proximal interlocking bolt, and took off a half a turn, and then removed the instruments, and took final C-arm pictures AP and lateral the entire length of the leg.   Anatomic reconstruction was achieved, and the wounds were irrigated copiously and closed with Vicryl followed by staples and sterile gauze for the skin. The patient was awakened and returned to PACU in stable and satisfactory condition. There no complications and the patient tolerated the procedure well.  He will be weightbearing as tolerated, and will be on chemical px  for a period of four weeks after discharge.   Margarita Rana, M.D.

## 2016-04-19 NOTE — Anesthesia Preprocedure Evaluation (Addendum)
Anesthesia Evaluation  Patient identified by MRN, date of birth, ID band Patient awake    Reviewed: Allergy & Precautions, NPO status , Patient's Chart, lab work & pertinent test results  History of Anesthesia Complications Negative for: history of anesthetic complications  Airway Mallampati: II  TM Distance: >3 FB Neck ROM: Full    Dental  (+) Missing   Pulmonary former smoker,    breath sounds clear to auscultation       Cardiovascular hypertension, Pt. on medications and Pt. on home beta blockers + CAD, + Cardiac Stents and + Peripheral Vascular Disease  + dysrhythmias Atrial Fibrillation + pacemaker  Rhythm:Regular  AAA 4.5cm   Neuro/Psych negative neurological ROS  negative psych ROS   GI/Hepatic negative GI ROS, Neg liver ROS,   Endo/Other  Hypothyroidism   Renal/GU ARF and CRFRenal disease     Musculoskeletal  (+) Arthritis ,   Abdominal   Peds  Hematology  (+) anemia ,   Anesthesia Other Findings Acute on chronic renal failure, dnr, refusing dialysis  Reproductive/Obstetrics                           Anesthesia Physical Anesthesia Plan  ASA: III  Anesthesia Plan: Spinal and MAC   Post-op Pain Management:    Induction:   Airway Management Planned: Natural Airway, Nasal Cannula and Simple Face Mask  Additional Equipment: None  Intra-op Plan:   Post-operative Plan:   Informed Consent: I have reviewed the patients History and Physical, chart, labs and discussed the procedure including the risks, benefits and alternatives for the proposed anesthesia with the patient or authorized representative who has indicated his/her understanding and acceptance.   Dental advisory given  Plan Discussed with: CRNA and Surgeon  Anesthesia Plan Comments:         Anesthesia Quick Evaluation

## 2016-04-19 NOTE — Progress Notes (Signed)
Md notified of pt's hgb 7.7 no bruises to right hip however hip is firm.  sbp been 80-100.  New orders received.  Will hold heparin at this time.  Pharmacy notified.  Will obtain H&H, type and screen.  And CT.  Will continue to monitor. Karena Addison T

## 2016-04-19 NOTE — Transfer of Care (Signed)
Immediate Anesthesia Transfer of Care Note  Patient: Connor Diaz  Procedure(s) Performed: Procedure(s): INTRAMEDULLARY (IM) NAIL INTERTROCHANTRIC (Right)  Patient Location: PACU  Anesthesia Type:Spinal  Level of Consciousness: awake, oriented and patient cooperative  Airway & Oxygen Therapy: Patient Spontanous Breathing and Patient connected to nasal cannula oxygen  Post-op Assessment: Report given to RN and Post -op Vital signs reviewed and stable  Post vital signs: Reviewed and stable  Last Vitals:  Vitals:   04/19/16 0444 04/19/16 0600  BP: 97/68 (!) 91/56  Pulse: (!) 32 77  Resp: (!) 22 15  Temp:  36.8 C    Last Pain:  Vitals:   04/19/16 0600  TempSrc: Oral  PainSc:       Patients Stated Pain Goal: 2 (04/18/16 2055)  Complications: No apparent anesthesia complications

## 2016-04-19 NOTE — Interval H&P Note (Signed)
History and Physical Interval Note:  04/19/2016 7:37 AM  Connor Diaz  has presented today for surgery, with the diagnosis of RIGHT HIP FRACTURE  The various methods of treatment have been discussed with the patient and family. After consideration of risks, benefits and other options for treatment, the patient has consented to  Procedure(s): INTRAMEDULLARY (IM) NAIL INTERTROCHANTRIC (Right) as a surgical intervention .  The patient's history has been reviewed, patient examined, no change in status, stable for surgery.  I have reviewed the patient's chart and labs.  Questions were answered to the patient's satisfaction.     Ledford Goodson D

## 2016-04-19 NOTE — Progress Notes (Signed)
Third call to radiology - Xray will be done on floor per radiology

## 2016-04-20 ENCOUNTER — Encounter (HOSPITAL_COMMUNITY): Payer: Self-pay | Admitting: Orthopedic Surgery

## 2016-04-20 ENCOUNTER — Encounter: Payer: Medicare Other | Admitting: Internal Medicine

## 2016-04-20 LAB — CBC
HCT: 27.1 % — ABNORMAL LOW (ref 39.0–52.0)
Hemoglobin: 9.3 g/dL — ABNORMAL LOW (ref 13.0–17.0)
MCH: 33.5 pg (ref 26.0–34.0)
MCHC: 34.3 g/dL (ref 30.0–36.0)
MCV: 97.5 fL (ref 78.0–100.0)
PLATELETS: 149 10*3/uL — AB (ref 150–400)
RBC: 2.78 MIL/uL — ABNORMAL LOW (ref 4.22–5.81)
RDW: 15.8 % — AB (ref 11.5–15.5)
WBC: 8.5 10*3/uL (ref 4.0–10.5)

## 2016-04-20 LAB — BASIC METABOLIC PANEL
Anion gap: 5 (ref 5–15)
BUN: 67 mg/dL — AB (ref 6–20)
CO2: 19 mmol/L — ABNORMAL LOW (ref 22–32)
CREATININE: 2.35 mg/dL — AB (ref 0.61–1.24)
Calcium: 8.1 mg/dL — ABNORMAL LOW (ref 8.9–10.3)
Chloride: 107 mmol/L (ref 101–111)
GFR calc Af Amer: 26 mL/min — ABNORMAL LOW (ref >60)
GFR, EST NON AFRICAN AMERICAN: 23 mL/min — AB (ref >60)
GLUCOSE: 110 mg/dL — AB (ref 65–99)
Potassium: 4.8 mmol/L (ref 3.5–5.1)
SODIUM: 131 mmol/L — AB (ref 135–145)

## 2016-04-20 MED ORDER — APIXABAN 2.5 MG PO TABS
2.5000 mg | ORAL_TABLET | Freq: Two times a day (BID) | ORAL | Status: DC
  Administered 2016-04-20 – 2016-04-23 (×6): 2.5 mg via ORAL
  Filled 2016-04-20 (×6): qty 1

## 2016-04-20 NOTE — Progress Notes (Signed)
S: Hip pain controlled O:BP 109/68 (BP Location: Right Arm)   Pulse 83   Temp 97.6 F (36.4 C) (Oral)   Resp 18   Ht 5\' 11"  (1.803 m)   Wt 82.5 kg (181 lb 12.8 oz)   SpO2 100%   BMI 25.36 kg/m   Intake/Output Summary (Last 24 hours) at 04/20/16 1124 Last data filed at 04/19/16 2000  Gross per 24 hour  Intake              240 ml  Output              440 ml  Net             -200 ml   Weight change:  ZOX:WRUEA and alert CVS:RRR Resp:clear Abd:+ BS NTND Ext: Mild swelling Rt leg NEURO:CNI Ox2, no asterixis   . sodium chloride  10 mL/hr Intravenous Once  . amiodarone  100 mg Oral Daily  . atorvastatin  40 mg Oral q1800  . Chlorhexidine Gluconate Cloth  6 each Topical Daily  . famotidine  20 mg Oral QPM  . feeding supplement  1 Container Oral TID BM  . finasteride  5 mg Oral QPM  . levothyroxine  75 mcg Oral QAC breakfast  . multivitamin with minerals  1 tablet Oral Daily  . mupirocin ointment  1 application Nasal BID  . senna-docusate  1 tablet Oral BID  . sodium chloride  500 mL Intravenous Once   Ct Abdomen Pelvis Wo Contrast  Result Date: 04/19/2016 CLINICAL DATA:  Status post acute right hip fracture. Patient on anticoagulation for atrial fibrillation. Acute on chronic anemia. Assess for right hip hematoma or retroperitoneal hemorrhage. Initial encounter. EXAM: CT ABDOMEN AND PELVIS WITHOUT CONTRAST TECHNIQUE: Multidetector CT imaging of the abdomen and pelvis was performed following the standard protocol without IV contrast. COMPARISON:  CT of the abdomen and pelvis from 10/24/2014 FINDINGS: Lower chest: Trace bilateral pleural effusions are noted. Bilateral emphysema is seen, with underlying bibasilar fibrotic change. Diffuse coronary artery calcifications are seen. A pacemaker lead is partially imaged. Hepatobiliary: The liver is unremarkable in appearance. The gallbladder is unremarkable in appearance. The common bile duct remains normal in caliber. Pancreas: The pancreas  is diffusely atrophic and grossly unremarkable. Spleen: The spleen is unremarkable in appearance. Adrenals/Urinary Tract: The adrenal glands are unremarkable in appearance. Nonspecific perinephric stranding is noted bilaterally. A small hyperdense cyst is noted at the anterior aspect of the right kidney. A small 3 mm calcification is noted at the upper pole of the right kidney. There is no evidence of hydronephrosis. No obstructing ureteral stones are seen. Stomach/Bowel: The stomach is unremarkable in appearance. The small bowel is within normal limits. The appendix is not visualized; there is no evidence for appendicitis. The colon is unremarkable in appearance. Vascular/Lymphatic: There is aneurysmal dilatation of the infrarenal abdominal aorta to 6.4 cm in AP dimension and 6.5 cm in transverse dimension. Diffuse calcification is seen along the abdominal aorta and its branches, including along the superior mesenteric artery. Aneurysmal dilatation resolves at the aortic bifurcation. There is diffuse calcification along the common iliac arteries, external and internal iliac arteries, and common femoral arteries bilaterally. No retroperitoneal or pelvic sidewall lymphadenopathy is seen. Reproductive: The bladder is mildly distended and grossly unremarkable. The prostate is enlarged, measuring 7.2 cm in transverse dimension. Other: Mild diffuse soft tissue hemorrhage is seen tracking along the right thigh and right hip, without a well-defined hematoma. Musculoskeletal: There is a comminuted right femoral  intertrochanteric fracture, with a displaced lesser trochanteric fragment. Mild intramuscular hemorrhage is seen along the proximal right thigh. Multilevel vacuum phenomenon is noted along the lower thoracic and lumbar spine. IMPRESSION: 1. Comminuted right femoral intertrochanteric fracture, with a displaced lesser trochanteric fragment. 2. Mild intramuscular hemorrhage along the proximal right thigh, with mild  diffuse overlying soft tissue hemorrhage. No well-defined hematoma seen. 3. Interval increase in size of large infrarenal abdominal aortic aneurysm, measuring 6.4 cm in AP dimension and 6.5 cm in transverse dimension. Vascular surgery consultation recommended due to increased risk of rupture for AAA >5.5 cm. This recommendation follows ACR consensus guidelines: White Paper of the ACR Incidental Findings Committee II on Vascular Findings. J Am Coll Radiol 2013; 10:789-794. 4. Diffuse aortic atherosclerosis. Diffuse calcification along the superior mesenteric artery, common iliac arteries, external and internal iliac arteries, and common femoral arteries bilaterally. 5. Markedly enlarged prostate.  Would correlate with PSA. 6. Trace bilateral pleural effusions. Bilateral emphysema, with underlying bibasilar fibrotic change. 7. Diffuse coronary artery calcifications seen. 8. Small 3 mm nonobstructing stone at the upper pole of the right kidney. Small hyperdense right renal cyst noted. These results were called by telephone at the time of interpretation on 04/19/2016 at 6:11 am to [facility] on [facility], who verbally acknowledged these results. Electronically Signed   By: Roanna Raider M.D.   On: 04/19/2016 06:13   Dg C-arm 1-60 Min  Result Date: 04/19/2016 CLINICAL DATA:  Right femur fracture EXAM: DG C-ARM 61-120 MIN; RIGHT FEMUR 2 VIEWS COMPARISON:  04/14/2016 FINDINGS: Fluoroscopy shows intramedullary nail and dynamic hip screw fixation of an intertrochanteric femur fracture. Fracture alignment is significantly improved. The lesser trochanter fragment remains distracted. No evidence of intraoperative fracture. IMPRESSION: Fluoroscopy for intertrochanteric femur fracture fixation. No unexpected finding. Electronically Signed   By: Marnee Spring M.D.   On: 04/19/2016 13:38   Dg Femur, Min 2 Views Right  Result Date: 04/19/2016 CLINICAL DATA:  Right femur fracture EXAM: DG C-ARM 61-120 MIN; RIGHT FEMUR 2 VIEWS  COMPARISON:  04/14/2016 FINDINGS: Fluoroscopy shows intramedullary nail and dynamic hip screw fixation of an intertrochanteric femur fracture. Fracture alignment is significantly improved. The lesser trochanter fragment remains distracted. No evidence of intraoperative fracture. IMPRESSION: Fluoroscopy for intertrochanteric femur fracture fixation. No unexpected finding. Electronically Signed   By: Marnee Spring M.D.   On: 04/19/2016 13:38   BMET    Component Value Date/Time   NA 131 (L) 04/20/2016 0309   K 4.8 04/20/2016 0309   CL 107 04/20/2016 0309   CO2 19 (L) 04/20/2016 0309   GLUCOSE 110 (H) 04/20/2016 0309   BUN 67 (H) 04/20/2016 0309   CREATININE 2.35 (H) 04/20/2016 0309   CREATININE 1.38 (H) 10/17/2014 1337   CALCIUM 8.1 (L) 04/20/2016 0309   GFRNONAA 23 (L) 04/20/2016 0309   GFRAA 26 (L) 04/20/2016 0309   CBC    Component Value Date/Time   WBC 8.5 04/20/2016 0309   RBC 2.78 (L) 04/20/2016 0309   HGB 9.3 (L) 04/20/2016 0309   HCT 27.1 (L) 04/20/2016 0309   PLT 149 (L) 04/20/2016 0309   MCV 97.5 04/20/2016 0309   MCH 33.5 04/20/2016 0309   MCHC 34.3 04/20/2016 0309   RDW 15.8 (H) 04/20/2016 0309   LYMPHSABS 1.1 04/18/2016 0029   MONOABS 2.2 (H) 04/18/2016 0029   EOSABS 0.1 04/18/2016 0029   BASOSABS 0.0 04/18/2016 0029     Assessment: 1. Acute on CKD 3, renal fx improving 2. Rt hip Fx SP  LRB, IM nail 3. Hx HTN, though BP on low side now  Plan: 1. Recheck renal fx in AM   Joyclyn Plazola T

## 2016-04-20 NOTE — Progress Notes (Signed)
OT Cancellation Note  Patient Details Name: JAXEL TRESTER MRN: [id number] DOB: 01-01-1924   Cancelled Treatment:    Reason Eval/Treat Not Completed: Fatigue/lethargy limiting ability to participate.  Pt just back to bed, per RN, and is sleeping soundly.  Will reattempt.  Prentis Langdon Rattan, OTR/L [phone]   Jeani Hawking M 04/20/2016, 3:55 PM

## 2016-04-20 NOTE — Progress Notes (Addendum)
   Assessment: 1 Day Post-Op  S/P Procedure(s) (LRB): INTRAMEDULLARY (IM) NAIL INTERTROCHANTRIC (Right) by Dr. Jewel Baize. Eulah Pont on 04/19/16  Principal Problem:   Intertrochanteric fracture of right hip [facility]) Active Problems:   Aneurysm of abdominal vessel (HCC)   Non-ischemic cardiomyopathy (HCC)   Paroxysmal atrial fibrillation (HCC)   Hip fracture (HCC)   Diarrhea   CKD (chronic kidney disease) stage 3, GFR 30-59 ml/min   Malnutrition of moderate degree  Plan: Up with therapy Continue care per Medicine  Weight Bearing: Weight Bearing as Tolerated (WBAT) Right Leg Dressings: Mepilex prn.  VTE prophylaxis: Per primary.  Okay to start Home medication - eliquis from an ortho perspective, SCDs, ambulation Dispo: Likely Skilled Nursing Facility/Rehab.  Therapy eval pending.  Subjective: Patient reports pain as mild. Pain controlled with PO meds.  No CP, SOB.  Not yet OOB.  Objective:   VITALS:   Vitals:   04/19/16 1500 04/19/16 1930 04/19/16 2305 04/20/16 0500  BP:   109/72 (!) 95/58  Pulse:   97 84  Resp:   20   Temp: 98.1 F (36.7 C) 97.6 F (36.4 C) 98.1 F (36.7 C) 97.9 F (36.6 C)  TempSrc: Axillary Oral Oral Oral  SpO2:   97% 98%  Weight:      Height:       CBC Latest Ref Rng & Units 04/20/2016 04/19/2016 04/19/2016  WBC 4.0 - 10.5 K/uL 8.5 - 7.7  Hemoglobin 13.0 - 17.0 g/dL 6.8(T) 1.5(B) 7.7(L)  Hematocrit 39.0 - 52.0 % 27.1(L) 26.6(L) 22.8(L)  Platelets 150 - 400 K/uL 149(L) - 140(L)   BMP Latest Ref Rng & Units 04/20/2016 04/19/2016 04/18/2016  Glucose 65 - 99 mg/dL 262(M) 355(H) 741(U)  BUN 6 - 20 mg/dL 38(G) 53(M) 46(O)  Creatinine 0.61 - 1.24 mg/dL 0.32(Z) 2.24(M) 2.50(I)  Sodium 135 - 145 mmol/L 131(L) 133(L) 133(L)  Potassium 3.5 - 5.1 mmol/L 4.8 4.6 4.4  Chloride 101 - 111 mmol/L 107 108 105  CO2 22 - 32 mmol/L 19(L) 20(L) 19(L)  Calcium 8.9 - 10.3 mg/dL 8.1(L) 7.8(L) 8.1(L)   Intake/Output      03/04 0701 - 03/05 0700 03/05 0701 - 03/06 0700   P.O. 240     I.V. (mL/kg) 1000 (12.1)    Blood 335    Other 900    IV Piggyback 250    Total Intake(mL/kg) 2725 (33)    Urine (mL/kg/hr) 915 (0.5)    Stool     Blood 150 (0.1)    Total Output 1065     Net +1660            Physical Exam: General: NAD.  No increased wob.  [place] in place.  Conversant, upright in bed. MSK RLE: Right knee swollen with effusion.  Non-tender to palpation.  No erythema or lesion. Sensation intact distally Feet warm Dorsiflexion/Plantar flexion intact Incision: dressing C/D/I  Albina Billet III, PA-C 04/20/2016, 7:03 AM

## 2016-04-20 NOTE — Progress Notes (Signed)
   VASCULAR SURGERY ASSESSMENT & PLAN:  This is a 83 YO gentleman known to me with a 6.5 cm pararenal AAA. He is not a candidate for an EVAR as the aneurysm extends above the renals. He was not felt to be a candidate for a fenestrated graft or thoracoabdominal repair by Dr. Pattricia Boss in [facility]. Fortunately the aneurysm has increased only slightly in size from 5.7 cm 18 months ago. We will be available as needed.   SUBJECTIVE: Pain well controlled.   PHYSICAL EXAM: Vitals:   04/19/16 1930 04/19/16 2305 04/20/16 0500 04/20/16 0822  BP:  109/72 (!) 95/58 109/68  Pulse:  97 84 83  Resp:  20  18  Temp: 97.6 F (36.4 C) 98.1 F (36.7 C) 97.9 F (36.6 C) 97.6 F (36.4 C)  TempSrc: Oral Oral Oral Oral  SpO2:  97% 98% 100%  Weight:      Height:       Aneurysm non tender  LABS: Lab Results  Component Value Date   WBC 8.5 04/20/2016   HGB 9.3 (L) 04/20/2016   HCT 27.1 (L) 04/20/2016   MCV 97.5 04/20/2016   PLT 149 (L) 04/20/2016   Lab Results  Component Value Date   CREATININE 2.35 (H) 04/20/2016   Lab Results  Component Value Date   INR 1.26 04/19/2016   CBG (last 3)  No results for input(s): GLUCAP in the last 72 hours.  Principal Problem:   Intertrochanteric fracture of right hip [facility]) Active Problems:   Aneurysm of abdominal vessel (HCC)   Non-ischemic cardiomyopathy (HCC)   Paroxysmal atrial fibrillation (HCC)   Hip fracture (HCC)   Diarrhea   CKD (chronic kidney disease) stage 3, GFR 30-59 ml/min   Malnutrition of moderate degree    Cari Caraway Beeper: [phone] 04/20/2016

## 2016-04-20 NOTE — NC FL2 (Signed)
[place] MEDICAID FL2 LEVEL OF CARE SCREENING TOOL     IDENTIFICATION  Patient Name: Connor Diaz Birthdate: Nov 08, 1923 Sex: male Admission Date (Current Location): 04/14/2016  [facility] and [place] Number:  Producer, television/film/video and Address:  [facility] [place], [address], [place], [place] [address]      Provider Number: [id number]  Attending Physician Name and Address:  Cathren Harsh, MD  Relative Name and Phone Number:  Oren Binet, spouse, [phone]    Current Level of Care: Hospital Recommended Level of Care: Skilled Nursing Facility Prior Approval Number:    Date Approved/Denied:   PASRR Number: [id number]  Discharge Plan: SNF    Current Diagnoses: Patient Active Problem List   Diagnosis Date Noted  . Intertrochanteric fracture of right hip (HCC) 04/19/2016  . Malnutrition of moderate degree 04/17/2016  . Hip fracture (HCC) 04/14/2016  . Diarrhea 04/14/2016  . CKD (chronic kidney disease) stage 3, GFR 30-59 ml/min 04/14/2016  . Paroxysmal atrial fibrillation (HCC) 09/27/2015  . Non-ischemic cardiomyopathy (HCC)   . Respiratory failure with hypoxia (HCC) 02/24/2013  . Bronchopneumonia 02/22/2013  . SOB (shortness of breath) 02/22/2013  . Acute combined systolic and diastolic heart failure (HCC) 02/22/2013  . CAP (community acquired pneumonia) 02/22/2013  . Cellulitis of left lower extremity 02/22/2013  . Aneurysm of abdominal vessel (HCC) 03/07/2012  . Bradycardia 03/04/2012  . CAD (coronary artery disease) 03/04/2012  . Pacemaker-St. Jude 11/23/2011    Orientation RESPIRATION BLADDER Height & Weight     Self, Situation, Place  O2 (Nasal cannula 2L) Indwelling catheter, Incontinent Weight: 82.5 kg (181 lb 12.8 oz) Height:  5\' 11"  (180.3 cm)  BEHAVIORAL SYMPTOMS/MOOD NEUROLOGICAL BOWEL NUTRITION STATUS      Incontinent Diet (Please see DC summary)  AMBULATORY STATUS COMMUNICATION OF NEEDS Skin   Extensive Assist Verbally Surgical  wounds (Closed incision on hip)                       Personal Care Assistance Level of Assistance  Bathing, Feeding, Dressing Bathing Assistance: Maximum assistance Feeding assistance: Limited assistance Dressing Assistance: Limited assistance     Functional Limitations Info  Hearing, Sight Sight Info: Impaired Hearing Info: Impaired      SPECIAL CARE FACTORS FREQUENCY  PT (By licensed PT)     PT Frequency: 5x/week              Contractures      Additional Factors Info  Code Status, Allergies Code Status Info: DNR Allergies Info: Iodinated Diagnostic Agents, Isovue Iopamidol           Current Medications (04/20/2016):  This is the current hospital active medication list Current Facility-Administered Medications  Medication Dose Route Frequency Provider Last Rate Last Dose  . 0.9 %  sodium chloride infusion   Intravenous Continuous Michael Litter, MD 10 mL/hr at 04/19/16 0440 10 mL at 04/19/16 0440  . 0.9 %  sodium chloride infusion  10 mL/hr Intravenous Once Melina Schools, CRNA      . amiodarone (PACERONE) tablet 100 mg  100 mg Oral Daily Jonah Blue, MD   100 mg at 04/20/16 0851  . apixaban (ELIQUIS) tablet 2.5 mg  2.5 mg Oral BID Ripudeep K Rai, MD      . atorvastatin (LIPITOR) tablet 40 mg  40 mg Oral q1800 Jonah Blue, MD   40 mg at 04/19/16 1843  . Chlorhexidine Gluconate Cloth 2 % PADS 6 each  6 each  Topical Daily Ripudeep Jenna Luo, MD   6 each at 04/20/16 [phone]  . famotidine (PEPCID) tablet 20 mg  20 mg Oral QPM Jonah Blue, MD   20 mg at 04/19/16 1843  . feeding supplement (BOOST / RESOURCE BREEZE) liquid 1 Container  1 Container Oral TID BM Ripudeep Jenna Luo, MD   1 Container at 04/20/16 1400  . finasteride (PROSCAR) tablet 5 mg  5 mg Oral QPM Jonah Blue, MD   5 mg at 04/19/16 1843  . HYDROcodone-acetaminophen (NORCO/VICODIN) 5-325 MG per tablet 1-2 tablet  1-2 tablet Oral Q4H PRN Jonah Blue, MD   2 tablet at 04/19/16 2309  . levothyroxine  (SYNTHROID, LEVOTHROID) tablet 75 mcg  75 mcg Oral QAC breakfast Jonah Blue, MD   75 mcg at 04/20/16 [phone]  . methocarbamol (ROBAXIN) tablet 500 mg  500 mg Oral Q6H PRN Jonah Blue, MD   500 mg at 04/16/16 1914   Or  . methocarbamol (ROBAXIN) 500 mg in dextrose 5 % 50 mL IVPB  500 mg Intravenous Q6H PRN Jonah Blue, MD      . morphine 2 MG/ML injection 2 mg  2 mg Intravenous Q2H PRN Jonah Blue, MD   2 mg at 04/18/16 1155  . multivitamin with minerals tablet 1 tablet  1 tablet Oral Daily Oval Linsey, MD   1 tablet at 04/20/16 0850  . mupirocin ointment (BACTROBAN) 2 % 1 application  1 application Nasal BID Ripudeep Jenna Luo, MD   1 application at 04/20/16 [phone]  . ondansetron (ZOFRAN) injection 4 mg  4 mg Intravenous Q4H PRN Oval Linsey, MD      . ondansetron [facility]) tablet 4 mg  4 mg Oral Q4H PRN Oval Linsey, MD   4 mg at 04/17/16 0954  . polyethylene glycol (MIRALAX / GLYCOLAX) packet 17 g  17 g Oral Daily PRN Jonah Blue, MD      . senna-docusate (Senokot-S) tablet 1 tablet  1 tablet Oral BID Ripudeep Jenna Luo, MD   1 tablet at 04/20/16 0851  . sodium chloride 0.9 % bolus 500 mL  500 mL Intravenous Once Jinger Neighbors, NP         Discharge Medications: Please see discharge summary for a list of discharge medications.  Relevant Imaging Results:  Relevant Lab Results:   Additional Information SSN: [id number] [place], [place]

## 2016-04-20 NOTE — Evaluation (Addendum)
Physical Therapy Evaluation Patient Details Name: Connor Diaz MRN: [id number] DOB: [birth year] Today's Date: 04/20/2016   History of Present Illness  Pt adm to [facility] with rt intertrochanteric hip fx on 2/27. Transferred to [facility] due to cardiac history. Worsening renal function delayed surgery and he underwent ORIF with IM rod on 3/4. PMH - AAA, ckd, nonischemic cardiomyopathy, htn, pacer, chf  Clinical Impression  Pt admitted with above diagnosis and presents to PT with functional limitations due to deficits listed below (See PT problem list). Pt needs skilled PT to maximize independence and safety to allow discharge to ST-SNF. Expect pt will make steady progress toward return to ambulatory status.     Follow Up Recommendations SNF    Equipment Recommendations  Other (comment) (to be assessed)    Recommendations for Other Services       Precautions / Restrictions Precautions Precautions: Fall Restrictions Weight Bearing Restrictions: Yes RLE Weight Bearing: Weight bearing as tolerated      Mobility  Bed Mobility Overal bed mobility: Needs Assistance Bed Mobility: Supine to Sit     Supine to sit: +2 for physical assistance;Mod assist     General bed mobility comments: Assist to move RLE, elevate trunk into sitting, and bring hips to EOB  Transfers Overall transfer level: Needs assistance Equipment used: Ambulation equipment used             General transfer comment: Attempted sit to stand with Stedy but unable to bring hips off of bed. Used maximove from bed to Research scientist (life sciences) Rankin (Stroke Patients Only)       Balance Overall balance assessment: Needs assistance Sitting-balance support: Bilateral upper extremity supported;Feet supported Sitting balance-Leahy Scale: Poor Sitting balance - Comments: Pt sat EOB x 15 minutes with initial min A and then  supervision with pt using UE's for support                                     Pertinent Vitals/Pain Pain Assessment: Faces Faces Pain Scale: Hurts even more Pain Location: rt hip with movement Pain Descriptors / Indicators: Grimacing;Guarding Pain Intervention(s): Limited activity within patient's tolerance;Monitored during session;Repositioned    Home Living Family/patient expects to be discharged to:: Private residence Living Arrangements: Spouse/significant other Available Help at Discharge: Family;Available 24 hours/day (wife unable to physically assist) Type of Home: House Home Access: Stairs to enter Entrance Stairs-Rails: None Entrance Stairs-Number of Steps: 1 Home Layout: One level Home Equipment: Walker - 2 wheels;Cane - single point      Prior Function Level of Independence: Independent with assistive device(s)               Hand Dominance        Extremity/Trunk Assessment   Upper Extremity Assessment Upper Extremity Assessment: Defer to OT evaluation    Lower Extremity Assessment Lower Extremity Assessment: Generalized weakness RLE Deficits / Details: Pt limited by pain but able to move with assist       Communication   Communication: HOH  Cognition Arousal/Alertness: Awake/alert Behavior During Therapy: WFL for tasks assessed/performed Overall Cognitive Status: Impaired/Different from baseline Area of Impairment: Orientation;Memory Orientation Level: Disoriented to;Time   Memory: Decreased short-term memory  General Comments      Exercises     Assessment/Plan    PT Assessment Patient needs continued PT services  PT Problem List Decreased strength;Decreased activity tolerance;Decreased balance;Decreased mobility;Decreased knowledge of use of DME;Pain       PT Treatment Interventions DME instruction;Gait training;Functional mobility training;Therapeutic activities;Therapeutic exercise;Balance  training;Patient/family education    PT Goals (Current goals can be found in the Care Plan section)  Acute Rehab PT Goals Patient Stated Goal: return to prior level of function PT Goal Formulation: With patient Time For Goal Achievement: 05/04/16 Potential to Achieve Goals: Good    Frequency Min 3X/week   Barriers to discharge Decreased caregiver support      Co-evaluation               End of Session Equipment Utilized During Treatment: Gait belt;Oxygen Activity Tolerance: Patient tolerated treatment well Patient left: in chair;with call bell/phone within reach;with family/visitor present Nurse Communication: Mobility status;Need for lift equipment PT Visit Diagnosis: Difficulty in walking, not elsewhere classified (R26.2)         Time: 9379-0240 PT Time Calculation (min) (ACUTE ONLY): 30 min   Charges:         PT G CodesAngelina Ok [place] 2016/05/11, 1:16 PM [employer] PT [phone]

## 2016-04-20 NOTE — Progress Notes (Addendum)
[facility]                                                                              Patient Demographics  Connor Diaz, is a 83 y.o. male, DOB - 01-18-1924, ZOX:[id number]  Admit date - 04/14/2016   Admitting Physician Jonah Blue, MD  Outpatient Primary MD for the patient is [name], MD  Outpatient specialists:   LOS - 6  days    Chief Complaint  Patient presents with  . Dizziness  . Fall       Brief summary   Patient is a 83 year old male with medical history significant of AAA; afib, on Eliquis; BPH; CKD stage 3; HTN; hypothyroidism; bradycardia s/p pacemaker placement; and CHF (EF 20-25% in 8/17) who originally presented to [facility] with right hip fracture on 04/14/16 after a mechanical fall. Patient was seen by orthopedics, Dr. Romeo Apple however due to his significant cardiac history, cardiologist (Dr [name]) and other medical issues, Dr. Romeo Apple recommended transfer to [facility] for orthopedics surgery and medical management.   Assessment & Plan    Principal Problem:  Right Hip fracture (HCC) - Patient transferred from [facility] hospital due to significant cardiac history - 2-D echo 2/28 showed EF of 25-30% with diffuse hypokinesis in the basal and mid inferolateral myocardium - Postop day #1 intramedullary nail surgery - Currently doing well, to start PT today - H&H stable, start Eliquis if cleared by orthopedics  Active Problems:   AAA/ Aneurysm of abdominal vessel (HCC) - Ultrasound abdominal done, showed Interval growth of an infrarenal abdominal aortic aneurysm which measures 6.7 cm AP x 7.2 cm transversely. - Patient was seen on 05/22/15 in [facility], seen by Dr Pattricia Boss, at that time AAA measured 6.1 x 6.2 cm, patient was recommended to follow up. Another note on 06/24/15 stated that he was not interested in moving forward with surgery due to potential impact on his quality of life - Vasc surgery  consulted, Dr. Darrick Penna discussed in detail with patient and wife in detail, recommended outpatient electively follow up with Dr. Pattricia Boss at [facility] if he recovers from his current acute medical problems, if patient has symptoms of rupture during the hospital stay, would consider open repair     Non-ischemic cardiomyopathy (HCC), Paroxysmal atrial fibrillation (HCC) - Rate controlled, with pacer, on amiodarone - Currently, eliquis on hold, CHADvasc score 5 - restarted eliquis     Acute on CKD (chronic kidney disease) stage 3, GFR 30-59 ml/min - Baseline creatinine ~1.7 - Per family, had not been eating well in the last few days -Nephrology following, renal function is improving   Acute on Chronic anemia  - monitor closely, baseline 12 - hb dropped to 7.7 on 3/4 unclear etiology, ?hemodilution, stat CT abd did not show any hematoma.    Code Status: DNR  DVT Prophylaxis:  Restarted eliquis  Family Communication: Discussed in detail with the patient, all imaging results, lab results explained to the patient, son, wife   Disposition Plan:   Time Spent in minutes   25 minutes  Procedures:  2-D echo  Consultants:   Cardiology  Orthopedics  nephrology Vascular surgery  Antimicrobials:      Medications  Scheduled Meds: . sodium chloride  10 mL/hr Intravenous Once  . amiodarone  100 mg Oral Daily  . atorvastatin  40 mg Oral q1800  . Chlorhexidine Gluconate Cloth  6 each Topical Daily  . famotidine  20 mg Oral QPM  . feeding supplement  1 Container Oral TID BM  . finasteride  5 mg Oral QPM  . levothyroxine  75 mcg Oral QAC breakfast  . multivitamin with minerals  1 tablet Oral Daily  . mupirocin ointment  1 application Nasal BID  . senna-docusate  1 tablet Oral BID  . sodium chloride  500 mL Intravenous Once   Continuous Infusions: . sodium chloride 10 mL (04/19/16 0440)   PRN Meds:.HYDROcodone-acetaminophen, methocarbamol **OR** methocarbamol (ROBAXIN)  IV, morphine  injection, ondansetron (ZOFRAN) IV, ondansetron, polyethylene glycol   Antibiotics   Anti-infectives    Start     Dose/Rate Route Frequency Ordered Stop   04/19/16 0600  ceFAZolin (ANCEF) IVPB 2g/100 mL premix     2 g 200 mL/hr over 30 Minutes Intravenous On call to O.R. 04/18/16 1939 04/19/16 0956        Subjective:   Connor Diaz was seen and examined today.Stable, denied any specific complaints, somewhat sleepy. Son and wife in the room.  No abdominal pain.  No nausea or vomiting.  Patient denies dizziness,  new weakness, numbess, tingling.   Objective:   Vitals:   04/19/16 1930 04/19/16 2305 04/20/16 0500 04/20/16 0822  BP:  109/72 (!) 95/58 109/68  Pulse:  97 84 83  Resp:  20  18  Temp: 97.6 F (36.4 C) 98.1 F (36.7 C) 97.9 F (36.6 C) 97.6 F (36.4 C)  TempSrc: Oral Oral Oral Oral  SpO2:  97% 98% 100%  Weight:      Height:        Intake/Output Summary (Last 24 hours) at 04/20/16 1209 Last data filed at 04/19/16 2000  Gross per 24 hour  Intake              120 ml  Output              440 ml  Net             -320 ml     Wt Readings from Last 3 Encounters:  04/19/16 82.5 kg (181 lb 12.8 oz)  04/14/16 79 kg (174 lb 1.6 oz)  10/23/15 68 kg (150 lb)     Exam  General: Sleepy but easily arousable, oriented x 3, NAD  HEENT:    Neck: Supple, no JVD  Cardiovascular: S1 S2 clear, irregularly regular   Respiratory: Clear to auscultation bilaterally, no wheezing, rales or rhonchi  Gastrointestinal: Soft, nontender, Mildly distended , + bowel sounds  Ext: no cyanosis clubbing or edema  Neuro:no new deficits   Skin: No rashes  Psych: sleepy but easily arousable   Data Reviewed:  I have personally reviewed following labs and imaging studies  Micro Results Recent Results (from the past 240 hour(s))  Surgical PCR screen     Status: Abnormal   Collection Time: 04/18/16 12:30 AM  Result Value Ref Range Status   MRSA, PCR NEGATIVE NEGATIVE Final    Staphylococcus aureus POSITIVE (A) NEGATIVE Final    Comment:        The Xpert SA Assay (FDA approved for NASAL specimens in patients over 41 years of age), is one component of a comprehensive  surveillance program.  Test performance has been validated by [facility] for patients greater than or equal to 20 year old. It is not intended to diagnose infection nor to guide or monitor treatment.   Surgical pcr screen     Status: Abnormal   Collection Time: 04/18/16  9:14 PM  Result Value Ref Range Status   MRSA, PCR NEGATIVE NEGATIVE Final   Staphylococcus aureus POSITIVE (A) NEGATIVE Final    Comment:        The Xpert SA Assay (FDA approved for NASAL specimens in patients over 3 years of age), is one component of a comprehensive surveillance program.  Test performance has been validated by [facility] for patients greater than or equal to 44 year old. It is not intended to diagnose infection nor to guide or monitor treatment.     Radiology Reports Ct Abdomen Pelvis Wo Contrast  Result Date: 04/19/2016 CLINICAL DATA:  Status post acute right hip fracture. Patient on anticoagulation for atrial fibrillation. Acute on chronic anemia. Assess for right hip hematoma or retroperitoneal hemorrhage. Initial encounter. EXAM: CT ABDOMEN AND PELVIS WITHOUT CONTRAST TECHNIQUE: Multidetector CT imaging of the abdomen and pelvis was performed following the standard protocol without IV contrast. COMPARISON:  CT of the abdomen and pelvis from 10/24/2014 FINDINGS: Lower chest: Trace bilateral pleural effusions are noted. Bilateral emphysema is seen, with underlying bibasilar fibrotic change. Diffuse coronary artery calcifications are seen. A pacemaker lead is partially imaged. Hepatobiliary: The liver is unremarkable in appearance. The gallbladder is unremarkable in appearance. The common bile duct remains normal in caliber. Pancreas: The pancreas is diffusely atrophic and grossly unremarkable.  Spleen: The spleen is unremarkable in appearance. Adrenals/Urinary Tract: The adrenal glands are unremarkable in appearance. Nonspecific perinephric stranding is noted bilaterally. A small hyperdense cyst is noted at the anterior aspect of the right kidney. A small 3 mm calcification is noted at the upper pole of the right kidney. There is no evidence of hydronephrosis. No obstructing ureteral stones are seen. Stomach/Bowel: The stomach is unremarkable in appearance. The small bowel is within normal limits. The appendix is not visualized; there is no evidence for appendicitis. The colon is unremarkable in appearance. Vascular/Lymphatic: There is aneurysmal dilatation of the infrarenal abdominal aorta to 6.4 cm in AP dimension and 6.5 cm in transverse dimension. Diffuse calcification is seen along the abdominal aorta and its branches, including along the superior mesenteric artery. Aneurysmal dilatation resolves at the aortic bifurcation. There is diffuse calcification along the common iliac arteries, external and internal iliac arteries, and common femoral arteries bilaterally. No retroperitoneal or pelvic sidewall lymphadenopathy is seen. Reproductive: The bladder is mildly distended and grossly unremarkable. The prostate is enlarged, measuring 7.2 cm in transverse dimension. Other: Mild diffuse soft tissue hemorrhage is seen tracking along the right thigh and right hip, without a well-defined hematoma. Musculoskeletal: There is a comminuted right femoral intertrochanteric fracture, with a displaced lesser trochanteric fragment. Mild intramuscular hemorrhage is seen along the proximal right thigh. Multilevel vacuum phenomenon is noted along the lower thoracic and lumbar spine. IMPRESSION: 1. Comminuted right femoral intertrochanteric fracture, with a displaced lesser trochanteric fragment. 2. Mild intramuscular hemorrhage along the proximal right thigh, with mild diffuse overlying soft tissue hemorrhage. No  well-defined hematoma seen. 3. Interval increase in size of large infrarenal abdominal aortic aneurysm, measuring 6.4 cm in AP dimension and 6.5 cm in transverse dimension. Vascular surgery consultation recommended due to increased risk of rupture for AAA >5.5 cm. This recommendation follows ACR consensus  guidelines: White Paper of the ACR Incidental Findings Committee II on Vascular Findings. J Am Coll Radiol 2013; 10:789-794. 4. Diffuse aortic atherosclerosis. Diffuse calcification along the superior mesenteric artery, common iliac arteries, external and internal iliac arteries, and common femoral arteries bilaterally. 5. Markedly enlarged prostate.  Would correlate with PSA. 6. Trace bilateral pleural effusions. Bilateral emphysema, with underlying bibasilar fibrotic change. 7. Diffuse coronary artery calcifications seen. 8. Small 3 mm nonobstructing stone at the upper pole of the right kidney. Small hyperdense right renal cyst noted. These results were called by telephone at the time of interpretation on 04/19/2016 at 6:11 am to [facility] on [facility], who verbally acknowledged these results. Electronically Signed   By: Roanna Raider M.D.   On: 04/19/2016 06:13   Dg Chest 1 View  Result Date: 04/14/2016 CLINICAL DATA:  Dizziness.  Fall. EXAM: CHEST 1 VIEW FINDINGS: Cardiac pacer with lead tips over the right atrium right ventricle. Cardiomegaly. Mild pulmonary vascular prominence with mild interstitial prominence. Mild CHF cannot be excluded. Underlying chronic interstitial lung disease most likely present. Small right pleural effusion cannot be excluded. Left costophrenic angle not imaged. No pneumothorax. No acute bony abnormalities. Degenerative changes both shoulders. IMPRESSION: 1. Cardiac pacer stable position. Cardiomegaly with mild bilateral from interstitial prominence suggesting mild CHF. Tiny right pleural effusion cannot be excluded. 2. Underlying chronic interstitial lung disease most likely present .  Electronically Signed   By: Maisie Fus  Register   On: 04/14/2016 17:00   Dg Knee 1-2 Views Right  Result Date: 04/14/2016 CLINICAL DATA:  Fall. EXAM: RIGHT KNEE - 1-2 VIEW COMPARISON:  04/14/2016. FINDINGS: Soft tissue swelling. Small effusion cannot be excluded. Severe tricompartment degenerative change in osteopenia. Subtle fracture, age undetermined, of the superior aspect of patella cannot be excluded. Bipartite patella could also present in this fashion. Peripheral vascular calcification . IMPRESSION: 1. Soft tissue swelling. Small effusion cannot be excluded. Subtle fracture, age undetermined, superior aspect of the patella. Bipartite patella could also present in this fashion. 2. Diffuse osteopenia and severe degenerative change. 3. Peripheral vascular disease. Electronically Signed   By: Maisie Fus  Register   On: 04/14/2016 17:02   US Aorta  Result Date: 04/15/2016 CLINICAL DATA:  Follow-up of an abdominal aortic aneurysm measuring 5.6 x 5.7 cm seen on CT angiogram of the abdomen pelvis of October 24, 2014 EXAM: ULTRASOUND OF ABDOMINAL AORTA TECHNIQUE: Ultrasound examination of the abdominal aorta was performed to evaluate for abdominal aortic aneurysm. COMPARISON:  CT angiogram of October 24, 2014 FINDINGS: Abdominal Aorta The proximal and mid aorta are obscured by bowel gas. There is an infrarenal abdominal aortic aneurysm measuring 6.7 cm AP x 7.2 cm transversely. There is considerable mural plaque and thrombus. The luminal diameter is approximately 3.5 cm. The aneurysm is 9.2 cm in length. The right common iliac artery measures 1.6 x 1.8 cm. The left common iliac artery measures 2.1 x 1.7 cm. Maximum Diameter: 7.2 cm in the transverse plane. IMPRESSION: Interval growth of an infrarenal abdominal aortic aneurysm which measures 6.7 cm AP x 7.2 cm transversely. It is 9.2 cm in length and extends into the left common iliac artery. The proximal and mid aorta could not be imaged due to bowel gas and the  patient's clinical condition which limited changes in position. These results will be called to the ordering clinician or representative by the Radiologist Assistant, and communication documented in the PACS or zVision Dashboard. Electronically Signed   By: David  [place] M.D.   On: 04/15/2016  13:42   Dg Chest Port 1 View  Result Date: 04/17/2016 CLINICAL DATA:  Acute onset of respiratory distress. Initial encounter. EXAM: PORTABLE CHEST 1 VIEW COMPARISON:  Chest radiograph performed 04/14/2016 FINDINGS: The lungs are well-aerated. Bibasilar airspace opacities raise concern for pneumonia, though mild interstitial edema could have a similar appearance. No definite pleural effusion or pneumothorax is seen. The cardiomediastinal silhouette is mildly enlarged. A pacemaker is noted overlying the left chest wall, with leads ending overlying the right atrium and right ventricle. No acute osseous abnormalities are seen. IMPRESSION: 1. Bibasilar airspace opacities raise concern for pneumonia, though mild interstitial edema could have a similar appearance. 2. Mild cardiomegaly. Electronically Signed   By: Roanna Raider M.D.   On: 04/17/2016 23:46   Dg Abd Portable 2v  Result Date: 04/17/2016 CLINICAL DATA:  Abdominal distention. EXAM: PORTABLE ABDOMEN - 2 VIEW COMPARISON:  CT abdomen and pelvis 10/24/2014. FINDINGS: No free intraperitoneal air is identified. There is mild gaseous distention of small and large bowel diffusely. Prominent stool burden ascending colon noted. IMPRESSION: Bowel gas pattern most in keeping with ileus. Large stool burden ascending colon. Electronically Signed   By: Drusilla Kanner M.D.   On: 04/17/2016 12:11   Dg C-arm 1-60 Min  Result Date: 04/19/2016 CLINICAL DATA:  Right femur fracture EXAM: DG C-ARM 61-120 MIN; RIGHT FEMUR 2 VIEWS COMPARISON:  04/14/2016 FINDINGS: Fluoroscopy shows intramedullary nail and dynamic hip screw fixation of an intertrochanteric femur fracture. Fracture  alignment is significantly improved. The lesser trochanter fragment remains distracted. No evidence of intraoperative fracture. IMPRESSION: Fluoroscopy for intertrochanteric femur fracture fixation. No unexpected finding. Electronically Signed   By: Marnee Spring M.D.   On: 04/19/2016 13:38   Dg Hip Unilat W Or Wo Pelvis 2-3 Views Right  Result Date: 04/14/2016 CLINICAL DATA:  Fall today with right hip pain and deformity EXAM: DG HIP (WITH OR WITHOUT PELVIS) 2-3V RIGHT COMPARISON:  None. FINDINGS: Bones are diffusely demineralized. Comminuted intertrochanteric right femoral neck fracture noted with varus angulation. Lesser trochanter exists as a free fragment. SI joints and symphysis pubis unremarkable. Distal aortic and iliac artery atherosclerosis evident. IMPRESSION: 1. Comminuted intertrochanteric right femoral neck fracture with varus angulation. 2.  Abdominal Aortic Atherosclerois (ICD10-170.0) Electronically Signed   By: Kennith Center M.D.   On: 04/14/2016 17:01   Dg Femur, Min 2 Views Right  Result Date: 04/19/2016 CLINICAL DATA:  Right femur fracture EXAM: DG C-ARM 61-120 MIN; RIGHT FEMUR 2 VIEWS COMPARISON:  04/14/2016 FINDINGS: Fluoroscopy shows intramedullary nail and dynamic hip screw fixation of an intertrochanteric femur fracture. Fracture alignment is significantly improved. The lesser trochanter fragment remains distracted. No evidence of intraoperative fracture. IMPRESSION: Fluoroscopy for intertrochanteric femur fracture fixation. No unexpected finding. Electronically Signed   By: Marnee Spring M.D.   On: 04/19/2016 13:38    Lab Data:  CBC:  Recent Labs Lab 04/14/16 1609 04/15/16 0550 04/18/16 0029 04/19/16 0201 04/19/16 0509 04/20/16 0309  WBC 8.3 9.5 10.7* 7.7  --  8.5  NEUTROABS 4.5  --  7.4  --   --   --   HGB 12.5* 10.6* 9.5* 7.7* 8.8* 9.3*  HCT 36.5* 31.5* 28.5* 22.8* 26.6* 27.1*  MCV 99.7 100.6* 100.7* 100.0  --  97.5  PLT 152 137* 149* 140*  --  149*    Basic Metabolic Panel:  Recent Labs Lab 04/15/16 0550 04/16/16 0613 04/18/16 0029 04/19/16 0201 04/20/16 0309  NA 135 136 133* 133* 131*  K 5.3* 5.6* 4.4 4.6  4.8  CL 106 104 105 108 107  CO2 24 23 19* 20* 19*  GLUCOSE 110* 123* 127* 110* 110*  BUN 38* 45* 67* 69* 67*  CREATININE 1.79* 2.26* 3.03* 2.91* 2.35*  CALCIUM 8.3* 8.8* 8.1* 7.8* 8.1*   GFR: Estimated Creatinine Clearance: 22.3 mL/min (by C-G formula based on SCr of 2.35 mg/dL (H)). Liver Function Tests:  Recent Labs Lab 04/14/16 1609 04/18/16 0029  AST 18 33  ALT 11* 11*  ALKPHOS 44 36*  BILITOT 0.6 0.8  PROT 6.8 5.6*  ALBUMIN 3.6 2.7*   No results for input(s): LIPASE, AMYLASE in the last 168 hours. No results for input(s): AMMONIA in the last 168 hours. Coagulation Profile:  Recent Labs Lab 04/19/16 0819  INR 1.26   Cardiac Enzymes: No results for input(s): CKTOTAL, CKMB, CKMBINDEX, TROPONINI in the last 168 hours. BNP (last 3 results) No results for input(s): PROBNP in the last 8760 hours. HbA1C: No results for input(s): HGBA1C in the last 72 hours. CBG:  Recent Labs Lab 04/14/16 1604  GLUCAP 117*   Lipid Profile: No results for input(s): CHOL, HDL, LDLCALC, TRIG, CHOLHDL, LDLDIRECT in the last 72 hours. Thyroid Function Tests: No results for input(s): TSH, T4TOTAL, FREET4, T3FREE, THYROIDAB in the last 72 hours. Anemia Panel: No results for input(s): VITAMINB12, FOLATE, FERRITIN, TIBC, IRON, RETICCTPCT in the last 72 hours. Urine analysis:    Component Value Date/Time   COLORURINE YELLOW 04/18/2016 0620   APPEARANCEUR CLEAR 04/18/2016 0620   LABSPEC 1.018 04/18/2016 0620   PHURINE 5.0 04/18/2016 0620   GLUCOSEU NEGATIVE 04/18/2016 0620   HGBUR NEGATIVE 04/18/2016 0620   BILIRUBINUR NEGATIVE 04/18/2016 0620   KETONESUR NEGATIVE 04/18/2016 0620   PROTEINUR NEGATIVE 04/18/2016 0620   NITRITE NEGATIVE 04/18/2016 0620   LEUKOCYTESUR NEGATIVE 04/18/2016 1610     RAI,RIPUDEEP  M.D. [facility] 04/20/2016, 12:09 PM  Pager: [phone] Between 7am to 7pm - call Pager - [phone]  After 7pm go to [email or web address] - password [email or web address]  Call night coverage person covering after 7pm

## 2016-04-20 NOTE — Progress Notes (Signed)
Subjective:  Denies any chest pain or shortness of breath. Renal function gradually improving. Hemoglobin remained stable  Objective:  Vital Signs in the last 24 hours: Temp:  [97.6 F (36.4 C)-98.1 F (36.7 C)] 97.6 F (36.4 C) (03/05 0822) Pulse Rate:  [70-97] 83 (03/05 0822) Resp:  [14-20] 18 (03/05 0822) BP: (95-127)/(58-84) 109/68 (03/05 0822) SpO2:  [82 %-100 %] 100 % (03/05 0822)  Intake/Output from previous day: 03/04 0701 - 03/05 0700 In: 2725 [P.O.:240; I.V.:1000; Blood:335; IV Piggyback:250] Out: 1065 [Urine:915; Blood:150] Intake/Output from this shift: No intake/output data recorded.  Physical Exam: Neck: no adenopathy, no carotid bruit, no JVD and supple, symmetrical, trachea midline Lungs: clear to auscultation bilaterally Heart: irregularly irregular rhythm, S1, S2 normal and Soft systolic murmur noted Abdomen: soft, non-tender; bowel sounds normal; no masses,  no organomegaly Extremities: extremities normal, atraumatic, no cyanosis or edema  Lab Results:  Recent Labs  04/19/16 0201 04/19/16 0509 04/20/16 0309  WBC 7.7  --  8.5  HGB 7.7* 8.8* 9.3*  PLT 140*  --  149*    Recent Labs  04/19/16 0201 04/20/16 0309  NA 133* 131*  K 4.6 4.8  CL 108 107  CO2 20* 19*  GLUCOSE 110* 110*  BUN 69* 67*  CREATININE 2.91* 2.35*   No results for input(s): TROPONINI in the last 72 hours.  Invalid input(s): CK, MB Hepatic Function Panel  Recent Labs  04/18/16 0029  PROT 5.6*  ALBUMIN 2.7*  AST 33  ALT 11*  ALKPHOS 36*  BILITOT 0.8   No results for input(s): CHOL in the last 72 hours. No results for input(s): PROTIME in the last 72 hours.  Imaging: Imaging results have been reviewed and Ct Abdomen Pelvis Wo Contrast  Result Date: 04/19/2016 CLINICAL DATA:  Status post acute right hip fracture. Patient on anticoagulation for atrial fibrillation. Acute on chronic anemia. Assess for right hip hematoma or retroperitoneal hemorrhage. Initial encounter.  EXAM: CT ABDOMEN AND PELVIS WITHOUT CONTRAST TECHNIQUE: Multidetector CT imaging of the abdomen and pelvis was performed following the standard protocol without IV contrast. COMPARISON:  CT of the abdomen and pelvis from 10/24/2014 FINDINGS: Lower chest: Trace bilateral pleural effusions are noted. Bilateral emphysema is seen, with underlying bibasilar fibrotic change. Diffuse coronary artery calcifications are seen. A pacemaker lead is partially imaged. Hepatobiliary: The liver is unremarkable in appearance. The gallbladder is unremarkable in appearance. The common bile duct remains normal in caliber. Pancreas: The pancreas is diffusely atrophic and grossly unremarkable. Spleen: The spleen is unremarkable in appearance. Adrenals/Urinary Tract: The adrenal glands are unremarkable in appearance. Nonspecific perinephric stranding is noted bilaterally. A small hyperdense cyst is noted at the anterior aspect of the right kidney. A small 3 mm calcification is noted at the upper pole of the right kidney. There is no evidence of hydronephrosis. No obstructing ureteral stones are seen. Stomach/Bowel: The stomach is unremarkable in appearance. The small bowel is within normal limits. The appendix is not visualized; there is no evidence for appendicitis. The colon is unremarkable in appearance. Vascular/Lymphatic: There is aneurysmal dilatation of the infrarenal abdominal aorta to 6.4 cm in AP dimension and 6.5 cm in transverse dimension. Diffuse calcification is seen along the abdominal aorta and its branches, including along the superior mesenteric artery. Aneurysmal dilatation resolves at the aortic bifurcation. There is diffuse calcification along the common iliac arteries, external and internal iliac arteries, and common femoral arteries bilaterally. No retroperitoneal or pelvic sidewall lymphadenopathy is seen. Reproductive: The bladder is mildly  distended and grossly unremarkable. The prostate is enlarged, measuring  7.2 cm in transverse dimension. Other: Mild diffuse soft tissue hemorrhage is seen tracking along the right thigh and right hip, without a well-defined hematoma. Musculoskeletal: There is a comminuted right femoral intertrochanteric fracture, with a displaced lesser trochanteric fragment. Mild intramuscular hemorrhage is seen along the proximal right thigh. Multilevel vacuum phenomenon is noted along the lower thoracic and lumbar spine. IMPRESSION: 1. Comminuted right femoral intertrochanteric fracture, with a displaced lesser trochanteric fragment. 2. Mild intramuscular hemorrhage along the proximal right thigh, with mild diffuse overlying soft tissue hemorrhage. No well-defined hematoma seen. 3. Interval increase in size of large infrarenal abdominal aortic aneurysm, measuring 6.4 cm in AP dimension and 6.5 cm in transverse dimension. Vascular surgery consultation recommended due to increased risk of rupture for AAA >5.5 cm. This recommendation follows ACR consensus guidelines: White Paper of the ACR Incidental Findings Committee II on Vascular Findings. J Am Coll Radiol 2013; 10:789-794. 4. Diffuse aortic atherosclerosis. Diffuse calcification along the superior mesenteric artery, common iliac arteries, external and internal iliac arteries, and common femoral arteries bilaterally. 5. Markedly enlarged prostate.  Would correlate with PSA. 6. Trace bilateral pleural effusions. Bilateral emphysema, with underlying bibasilar fibrotic change. 7. Diffuse coronary artery calcifications seen. 8. Small 3 mm nonobstructing stone at the upper pole of the right kidney. Small hyperdense right renal cyst noted. These results were called by telephone at the time of interpretation on 04/19/2016 at 6:11 am to [facility] on [facility], who verbally acknowledged these results. Electronically Signed   By: Roanna Raider M.D.   On: 04/19/2016 06:13   Dg C-arm 1-60 Min  Result Date: 04/19/2016 CLINICAL DATA:  Right femur fracture EXAM: DG  C-ARM 61-120 MIN; RIGHT FEMUR 2 VIEWS COMPARISON:  04/14/2016 FINDINGS: Fluoroscopy shows intramedullary nail and dynamic hip screw fixation of an intertrochanteric femur fracture. Fracture alignment is significantly improved. The lesser trochanter fragment remains distracted. No evidence of intraoperative fracture. IMPRESSION: Fluoroscopy for intertrochanteric femur fracture fixation. No unexpected finding. Electronically Signed   By: Marnee Spring M.D.   On: 04/19/2016 13:38   Dg Femur, Min 2 Views Right  Result Date: 04/19/2016 CLINICAL DATA:  Right femur fracture EXAM: DG C-ARM 61-120 MIN; RIGHT FEMUR 2 VIEWS COMPARISON:  04/14/2016 FINDINGS: Fluoroscopy shows intramedullary nail and dynamic hip screw fixation of an intertrochanteric femur fracture. Fracture alignment is significantly improved. The lesser trochanter fragment remains distracted. No evidence of intraoperative fracture. IMPRESSION: Fluoroscopy for intertrochanteric femur fracture fixation. No unexpected finding. Electronically Signed   By: Marnee Spring M.D.   On: 04/19/2016 13:38    Cardiac Studies:  Assessment/Plan:  Comminuted right trochanteric femoral neck fracture, status post fall status post intramedullary nail right hip postoperative 1 doing well Multivessel coronary artery disease, history of silent inferior wall MI and remote past, status post multivessel PCI to LAD, left circumflex and obtuse marginal 3 in November 2013. Ischemic cardiomyopathy. Tachybradycardia syndrome status post permanent pacemaker in the past History of congestive heart failure secondary to depressed LV systolic function. Critical abdominal aortic aneurysm.being followed at [facility] Chronic atrial fibrillation chads vasc score of 5 on chronic anticoagulation Acute on chronic kidney disease stage III./Hypovolemia secondary to hypotension and hypovolemia and ACE inhibitor Chronic  anemia Hypotension Hyperlipidemia. Hypothyroidism. Degenerative joint disease. Protein calorie malnutrition Plan Out of bed to chair as tolerated Okay to restart Eliquis 2.5 mg twice daily if okay with surgery Check labs in a.m.  LOS: 6 days  Rinaldo Cloud 04/20/2016, 11:45 AM

## 2016-04-20 NOTE — Progress Notes (Signed)
Pt's wife asking questions about placement after pt's hospital stay and wanting to know whether their long term care policy will help with any of the expenses or placement options. RN informed pt that these questions will be posted in the chart and RN left note for 4N case manager Eunice Blase) to possibly follow up on. Will also pass information along to night shift RN so that they may inform tomorrow's RN of the wifes questions.

## 2016-04-21 ENCOUNTER — Encounter (HOSPITAL_COMMUNITY): Payer: Self-pay | Admitting: Orthopedic Surgery

## 2016-04-21 LAB — BASIC METABOLIC PANEL
Anion gap: 7 (ref 5–15)
BUN: 63 mg/dL — AB (ref 6–20)
CHLORIDE: 108 mmol/L (ref 101–111)
CO2: 19 mmol/L — ABNORMAL LOW (ref 22–32)
CREATININE: 1.94 mg/dL — AB (ref 0.61–1.24)
Calcium: 8.5 mg/dL — ABNORMAL LOW (ref 8.9–10.3)
GFR calc non Af Amer: 29 mL/min — ABNORMAL LOW (ref >60)
GFR, EST AFRICAN AMERICAN: 33 mL/min — AB (ref >60)
Glucose, Bld: 101 mg/dL — ABNORMAL HIGH (ref 65–99)
POTASSIUM: 4.8 mmol/L (ref 3.5–5.1)
SODIUM: 134 mmol/L — AB (ref 135–145)

## 2016-04-21 LAB — CBC
HCT: 26.3 % — ABNORMAL LOW (ref 39.0–52.0)
Hemoglobin: 8.9 g/dL — ABNORMAL LOW (ref 13.0–17.0)
MCH: 33 pg (ref 26.0–34.0)
MCHC: 33.8 g/dL (ref 30.0–36.0)
MCV: 97.4 fL (ref 78.0–100.0)
PLATELETS: 159 10*3/uL (ref 150–400)
RBC: 2.7 MIL/uL — AB (ref 4.22–5.81)
RDW: 15.6 % — AB (ref 11.5–15.5)
WBC: 10 10*3/uL (ref 4.0–10.5)

## 2016-04-21 MED ORDER — MORPHINE SULFATE (PF) 2 MG/ML IV SOLN: 1.0000 mg | INTRAVENOUS | Status: DC | PRN

## 2016-04-21 MED ORDER — MAGNESIUM CITRATE PO SOLN
1.0000 | Freq: Once | ORAL | Status: AC
  Administered 2016-04-21: 1 via ORAL
  Filled 2016-04-21: qty 296

## 2016-04-21 MED ORDER — HYDROCODONE-ACETAMINOPHEN 5-325 MG PO TABS
1.0000 | ORAL_TABLET | Freq: Four times a day (QID) | ORAL | Status: DC | PRN
  Administered 2016-04-21: 1 via ORAL
  Filled 2016-04-21: qty 1

## 2016-04-21 MED ORDER — ACETAMINOPHEN 500 MG PO TABS
1000.0000 mg | ORAL_TABLET | Freq: Three times a day (TID) | ORAL | Status: AC
  Administered 2016-04-21 – 2016-04-22 (×5): 1000 mg via ORAL
  Filled 2016-04-21 (×6): qty 2

## 2016-04-21 NOTE — Progress Notes (Addendum)
   Assessment: 2 Days Post-Op  S/P Procedure(s) (LRB): INTRAMEDULLARY (IM) NAIL INTERTROCHANTRIC (Right) by Dr. Jewel Baize. Eulah Pont on 04/19/16  Principal Problem:   Intertrochanteric fracture of right hip [facility]) Active Problems:   Aneurysm of abdominal vessel (HCC)   Non-ischemic cardiomyopathy (HCC)   Paroxysmal atrial fibrillation (HCC)   Hip fracture (HCC)   Diarrhea   CKD (chronic kidney disease) stage 3, GFR 30-59 ml/min   Malnutrition of moderate degree  Progressing well, slowly as expected.  He is eager to mobilize.  Plan: Up with therapy Continue care per Medicine  Weight Bearing: Weight Bearing as Tolerated (WBAT) Right Leg Dressings: Mepilex prn.  Change if soiled or falling off. VTE prophylaxis: Eliquis, SCDs, ambulation Dispo: Per primary Skilled Nursing Facility/Rehab.    Subjective: Patient reports pain as mild / improving. Pain controlled with PO meds.  No CP, SOB.  Up to edge of bed and to chair.  Objective:   VITALS:   Vitals:   04/20/16 1622 04/20/16 1900 04/20/16 2300 04/21/16 0300  BP: 122/80 118/75 104/75 114/74  Pulse: 78 83 86 98  Resp: 11 19 17 15   Temp: 98.1 F (36.7 C) 98.7 F (37.1 C) 98.7 F (37.1 C) 98.1 F (36.7 C)  TempSrc: Oral Oral Oral Oral  SpO2: 94% 100% 98% 97%  Weight:      Height:       CBC Latest Ref Rng & Units 04/21/2016 04/20/2016 04/19/2016  WBC 4.0 - 10.5 K/uL 10.0 8.5 -  Hemoglobin 13.0 - 17.0 g/dL 2.8(V) 7.9(N) 5.0(C)  Hematocrit 39.0 - 52.0 % 26.3(L) 27.1(L) 26.6(L)  Platelets 150 - 400 K/uL 159 149(L) -   BMP Latest Ref Rng & Units 04/21/2016 04/20/2016 04/19/2016  Glucose 65 - 99 mg/dL 136(C) 383(J) 793(P)  BUN 6 - 20 mg/dL 68(G) 64(G) 47(U)  Creatinine 0.61 - 1.24 mg/dL 0.72(T) 8.28(Q) 3.37(O)  Sodium 135 - 145 mmol/L 134(L) 131(L) 133(L)  Potassium 3.5 - 5.1 mmol/L 4.8 4.8 4.6  Chloride 101 - 111 mmol/L 108 107 108  CO2 22 - 32 mmol/L 19(L) 19(L) 20(L)  Calcium 8.9 - 10.3 mg/dL 4.5(H) 8.1(L) 7.8(L)   Intake/Output        03/05 0701 - 03/06 0700 03/06 0701 - 03/07 0700   P.O. 600    I.V. (mL/kg)     Blood     Other     IV Piggyback     Total Intake(mL/kg) 600 (7.3)    Urine (mL/kg/hr) 250 (0.1)    Blood     Total Output 250     Net +350          Urine Occurrence 1 x      Physical Exam: General: NAD.  No increased wob.  Conversant, upright in bed. MSK RLE: Right knee swollen with effusion.  Non-tender to palpation.  No erythema or lesion. Sensation intact distally Feet warm Dorsiflexion/Plantar flexion intact Incision: dressing C/D/I  Albina Billet III, PA-C 04/21/2016, 7:34 AM

## 2016-04-21 NOTE — Progress Notes (Signed)
S: Working with PT.  Says he is eating O:BP 113/77 (BP Location: Right Arm)   Pulse 83   Temp 97.8 F (36.6 C) (Axillary)   Resp 18   Ht 5\' 11"  (1.803 m)   Wt 82.5 kg (181 lb 12.8 oz)   SpO2 98%   BMI 25.36 kg/m   Intake/Output Summary (Last 24 hours) at 04/21/16 1048 Last data filed at 04/21/16 0600  Gross per 24 hour  Intake              600 ml  Output              250 ml  Net              350 ml   Weight change:  VQX:IHWTU and alert CVS:RRR Resp:clear Abd:+ BS NTND Ext: Mild swelling Rt leg NEURO:CNI Ox3, no asterixis   . sodium chloride  10 mL/hr Intravenous Once  . acetaminophen  1,000 mg Oral TID  . amiodarone  100 mg Oral Daily  . apixaban  2.5 mg Oral BID  . atorvastatin  40 mg Oral q1800  . Chlorhexidine Gluconate Cloth  6 each Topical Daily  . famotidine  20 mg Oral QPM  . feeding supplement  1 Container Oral TID BM  . finasteride  5 mg Oral QPM  . levothyroxine  75 mcg Oral QAC breakfast  . magnesium citrate  1 Bottle Oral Once  . multivitamin with minerals  1 tablet Oral Daily  . mupirocin ointment  1 application Nasal BID  . senna-docusate  1 tablet Oral BID  . sodium chloride  500 mL Intravenous Once   No results found. BMET    Component Value Date/Time   NA 134 (L) 04/21/2016 0519   K 4.8 04/21/2016 0519   CL 108 04/21/2016 0519   CO2 19 (L) 04/21/2016 0519   GLUCOSE 101 (H) 04/21/2016 0519   BUN 63 (H) 04/21/2016 0519   CREATININE 1.94 (H) 04/21/2016 0519   CREATININE 1.38 (H) 10/17/2014 1337   CALCIUM 8.5 (L) 04/21/2016 0519   GFRNONAA 29 (L) 04/21/2016 0519   GFRAA 33 (L) 04/21/2016 0519   CBC    Component Value Date/Time   WBC 10.0 04/21/2016 0519   RBC 2.70 (L) 04/21/2016 0519   HGB 8.9 (L) 04/21/2016 0519   HCT 26.3 (L) 04/21/2016 0519   PLT 159 04/21/2016 0519   MCV 97.4 04/21/2016 0519   MCH 33.0 04/21/2016 0519   MCHC 33.8 04/21/2016 0519   RDW 15.6 (H) 04/21/2016 0519   LYMPHSABS 1.1 04/18/2016 0029   MONOABS 2.2 (H)  04/18/2016 0029   EOSABS 0.1 04/18/2016 0029   BASOSABS 0.0 04/18/2016 0029     Assessment: 1. Acute on CKD 3, renal fx improving and close to baseline 2. Rt hip Fx SP LRB, IM nail 3. Hx HTN, though BP on low side now  Plan: 1.Will sign off, call if further renal issues   Connor Diaz T

## 2016-04-21 NOTE — Anesthesia Postprocedure Evaluation (Addendum)
Anesthesia Post Note  Patient: Connor Diaz  Procedure(s) Performed: Procedure(s) (LRB): INTRAMEDULLARY (IM) NAIL INTERTROCHANTRIC (Right)  Patient location during evaluation: PACU Anesthesia Type: MAC Level of consciousness: awake and alert Pain management: pain level controlled Vital Signs Assessment: post-procedure vital signs reviewed and stable Respiratory status: spontaneous breathing, nonlabored ventilation, respiratory function stable and patient connected to nasal cannula oxygen Cardiovascular status: stable and blood pressure returned to baseline Postop Assessment: spinal receding Anesthetic complications: no       Last Vitals:  Vitals:   04/21/16 1540 04/21/16 1605  BP: (!) 141/85 131/77  Pulse: 84 69  Resp: (!) 23 17  Temp:  36.4 C    Last Pain:  Vitals:   04/21/16 1622  TempSrc:   PainSc: 0-No pain                 Demarri Elie

## 2016-04-21 NOTE — Progress Notes (Signed)
[facility]                                                                              Patient Demographics  Connor Diaz, is a 83 y.o. male, DOB - [birth year], CVU:[id number]  Admit date - 04/14/2016   Admitting Physician Jonah Blue, MD  Outpatient Primary MD for the patient is [name], MD  Outpatient specialists:   LOS - 7  days    Chief Complaint  Patient presents with  . Dizziness  . Fall       Brief summary   Patient is a 83 year old male with medical history significant of AAA; afib, on Eliquis; BPH; CKD stage 3; HTN; hypothyroidism; bradycardia s/p pacemaker placement; and CHF (EF 20-25% in 8/17) who originally presented to [facility] with right hip fracture on 04/14/16 after a mechanical fall. Patient was seen by orthopedics, Dr. Romeo Apple however due to his significant cardiac history, cardiologist (Dr [name]) and other medical issues, Dr. Romeo Apple recommended transfer to [facility] for orthopedics surgery and medical management.   Assessment & Plan    Principal Problem:  Right Hip fracture (HCC) - Patient transferred from [facility] hospital due to significant cardiac history - 2-D echo 2/28 showed EF of 25-30% with diffuse hypokinesis in the basal and mid inferolateral myocardium - Postop day # 2 s/p intramedullary nail surgery -  H&H stable -Mildly confused today due to narcotics, Placed on scheduled Tylenol and decrease Norco   Active Problems:   AAA/ Aneurysm of abdominal vessel (HCC) - Ultrasound abdominal done, showed Interval growth of an infrarenal abdominal aortic aneurysm which measures 6.7 cm AP x 7.2 cm transversely. - Patient was seen on 05/22/15 in [facility], seen by Dr Pattricia Boss, at that time AAA measured 6.1 x 6.2 cm, patient was recommended to follow up. Another note on 06/24/15 stated that he was not interested in moving forward with surgery due to potential impact on his quality of life - Vasc  surgery consulted, Dr. Darrick Penna discussed in detail with patient and wife in detail, recommended outpatient electively follow up with Dr. Pattricia Boss at [facility] if he recovers from his current acute medical problems, if patient has symptoms of rupture during the hospital stay, would consider open repair     Non-ischemic cardiomyopathy (HCC), Paroxysmal atrial fibrillation (HCC) - Rate controlled, with pacer, on amiodarone - Currently, eliquis on hold, CHADvasc score 5 - restarted eliquis     Acute on CKD (chronic kidney disease) stage 3, GFR 30-59 ml/min - Baseline creatinine ~1.7 - Per family, had not been eating well in the last few days -Nephrology following, renal function is improving   Acute on Chronic anemia  - monitor closely, baseline 12 - hb dropped to 7.7 on 3/4 unclear etiology, ?hemodilution, stat CT abd did not show any hematoma.  - Currently H&H stable,  Constipation Continue Senokot, MiraLAX, add mag citrate  Code Status: DNR  DVT Prophylaxis:  Restarted eliquis  Family Communication: Discussed in detail with the patient, all imaging results, lab results explained to the patient and wife   Disposition Plan: DC in next 24-48 hours to  skilled nursing facility, pending bed available  Time Spent in minutes   25 minutes  Procedures:  2-D echo  Consultants:   Cardiology Orthopedics  nephrology Vascular surgery  Antimicrobials:      Medications  Scheduled Meds: . sodium chloride  10 mL/hr Intravenous Once  . acetaminophen  1,000 mg Oral TID  . amiodarone  100 mg Oral Daily  . apixaban  2.5 mg Oral BID  . atorvastatin  40 mg Oral q1800  . Chlorhexidine Gluconate Cloth  6 each Topical Daily  . famotidine  20 mg Oral QPM  . feeding supplement  1 Container Oral TID BM  . finasteride  5 mg Oral QPM  . levothyroxine  75 mcg Oral QAC breakfast  . magnesium citrate  1 Bottle Oral Once  . multivitamin with minerals  1 tablet Oral Daily  . mupirocin ointment  1  application Nasal BID  . senna-docusate  1 tablet Oral BID  . sodium chloride  500 mL Intravenous Once   Continuous Infusions: . sodium chloride 10 mL (04/19/16 0440)   PRN Meds:.HYDROcodone-acetaminophen, methocarbamol **OR** [DISCONTINUED] methocarbamol (ROBAXIN)  IV, morphine injection, ondansetron (ZOFRAN) IV, ondansetron, polyethylene glycol   Antibiotics   Anti-infectives    Start     Dose/Rate Route Frequency Ordered Stop   04/19/16 0600  ceFAZolin (ANCEF) IVPB 2g/100 mL premix     2 g 200 mL/hr over 30 Minutes Intravenous On call to O.R. 04/18/16 1939 04/19/16 0956        Subjective:   Connor Diaz was seen and examined today.Slightly confused otherwise stable, pain is controlled. Constipated. No chest pain.  No abdominal pain.  No nausea or vomiting.  Patient denies dizziness,  new weakness, numbess, tingling.   Objective:   Vitals:   04/21/16 0300 04/21/16 0745 04/21/16 0800 04/21/16 1201  BP: 114/74 113/77  102/71  Pulse: 98  83 83  Resp: 15 19 18 17   Temp: 98.1 F (36.7 C)  97.8 F (36.6 C) 97.6 F (36.4 C)  TempSrc: Oral  Axillary Oral  SpO2: 97% 100% 98% 95%  Weight:      Height:        Intake/Output Summary (Last 24 hours) at 04/21/16 1414 Last data filed at 04/21/16 1000  Gross per 24 hour  Intake              960 ml  Output              250 ml  Net              710 ml     Wt Readings from Last 3 Encounters:  04/19/16 82.5 kg (181 lb 12.8 oz)  04/14/16 79 kg (174 lb 1.6 oz)  10/23/15 68 kg (150 lb)     Exam  General: Alert and oriented x2, slightly confused   HEENT:    Neck: Supple, no JVD  Cardiovascular: S1 S2 clear, irregularly regular   Respiratory: Clear to auscultation bilaterally, no wheezing, rales or rhonchi  Gastrointestinal: Soft, nontender, Mildly distended , + bowel sounds  Ext: no cyanosis clubbing or edema  Neuro:no new deficits   Skin: No rashes  Psych: alert and oriented 2   Data Reviewed:  I have  personally reviewed following labs and imaging studies  Micro Results Recent Results (from the past 240 hour(s))  Surgical PCR screen     Status: Abnormal   Collection Time: 04/18/16 12:30 AM  Result Value Ref Range Status   MRSA,  PCR NEGATIVE NEGATIVE Final   Staphylococcus aureus POSITIVE (A) NEGATIVE Final    Comment:        The Xpert SA Assay (FDA approved for NASAL specimens in patients over 35 years of age), is one component of a comprehensive surveillance program.  Test performance has been validated by [facility] for patients greater than or equal to 12 year old. It is not intended to diagnose infection nor to guide or monitor treatment.   Surgical pcr screen     Status: Abnormal   Collection Time: 04/18/16  9:14 PM  Result Value Ref Range Status   MRSA, PCR NEGATIVE NEGATIVE Final   Staphylococcus aureus POSITIVE (A) NEGATIVE Final    Comment:        The Xpert SA Assay (FDA approved for NASAL specimens in patients over 61 years of age), is one component of a comprehensive surveillance program.  Test performance has been validated by [facility] for patients greater than or equal to 81 year old. It is not intended to diagnose infection nor to guide or monitor treatment.     Radiology Reports Ct Abdomen Pelvis Wo Contrast  Result Date: 04/19/2016 CLINICAL DATA:  Status post acute right hip fracture. Patient on anticoagulation for atrial fibrillation. Acute on chronic anemia. Assess for right hip hematoma or retroperitoneal hemorrhage. Initial encounter. EXAM: CT ABDOMEN AND PELVIS WITHOUT CONTRAST TECHNIQUE: Multidetector CT imaging of the abdomen and pelvis was performed following the standard protocol without IV contrast. COMPARISON:  CT of the abdomen and pelvis from 10/24/2014 FINDINGS: Lower chest: Trace bilateral pleural effusions are noted. Bilateral emphysema is seen, with underlying bibasilar fibrotic change. Diffuse coronary artery calcifications are  seen. A pacemaker lead is partially imaged. Hepatobiliary: The liver is unremarkable in appearance. The gallbladder is unremarkable in appearance. The common bile duct remains normal in caliber. Pancreas: The pancreas is diffusely atrophic and grossly unremarkable. Spleen: The spleen is unremarkable in appearance. Adrenals/Urinary Tract: The adrenal glands are unremarkable in appearance. Nonspecific perinephric stranding is noted bilaterally. A small hyperdense cyst is noted at the anterior aspect of the right kidney. A small 3 mm calcification is noted at the upper pole of the right kidney. There is no evidence of hydronephrosis. No obstructing ureteral stones are seen. Stomach/Bowel: The stomach is unremarkable in appearance. The small bowel is within normal limits. The appendix is not visualized; there is no evidence for appendicitis. The colon is unremarkable in appearance. Vascular/Lymphatic: There is aneurysmal dilatation of the infrarenal abdominal aorta to 6.4 cm in AP dimension and 6.5 cm in transverse dimension. Diffuse calcification is seen along the abdominal aorta and its branches, including along the superior mesenteric artery. Aneurysmal dilatation resolves at the aortic bifurcation. There is diffuse calcification along the common iliac arteries, external and internal iliac arteries, and common femoral arteries bilaterally. No retroperitoneal or pelvic sidewall lymphadenopathy is seen. Reproductive: The bladder is mildly distended and grossly unremarkable. The prostate is enlarged, measuring 7.2 cm in transverse dimension. Other: Mild diffuse soft tissue hemorrhage is seen tracking along the right thigh and right hip, without a well-defined hematoma. Musculoskeletal: There is a comminuted right femoral intertrochanteric fracture, with a displaced lesser trochanteric fragment. Mild intramuscular hemorrhage is seen along the proximal right thigh. Multilevel vacuum phenomenon is noted along the lower  thoracic and lumbar spine. IMPRESSION: 1. Comminuted right femoral intertrochanteric fracture, with a displaced lesser trochanteric fragment. 2. Mild intramuscular hemorrhage along the proximal right thigh, with mild diffuse overlying soft tissue hemorrhage. No  well-defined hematoma seen. 3. Interval increase in size of large infrarenal abdominal aortic aneurysm, measuring 6.4 cm in AP dimension and 6.5 cm in transverse dimension. Vascular surgery consultation recommended due to increased risk of rupture for AAA >5.5 cm. This recommendation follows ACR consensus guidelines: White Paper of the ACR Incidental Findings Committee II on Vascular Findings. J Am Coll Radiol 2013; 10:789-794. 4. Diffuse aortic atherosclerosis. Diffuse calcification along the superior mesenteric artery, common iliac arteries, external and internal iliac arteries, and common femoral arteries bilaterally. 5. Markedly enlarged prostate.  Would correlate with PSA. 6. Trace bilateral pleural effusions. Bilateral emphysema, with underlying bibasilar fibrotic change. 7. Diffuse coronary artery calcifications seen. 8. Small 3 mm nonobstructing stone at the upper pole of the right kidney. Small hyperdense right renal cyst noted. These results were called by telephone at the time of interpretation on 04/19/2016 at 6:11 am to [facility], who verbally acknowledged these results. Electronically Signed   By: Roanna Raider M.D.   On: 04/19/2016 06:13   Dg Chest 1 View  Result Date: 04/14/2016 CLINICAL DATA:  Dizziness.  Fall. EXAM: CHEST 1 VIEW FINDINGS: Cardiac pacer with lead tips over the right atrium right ventricle. Cardiomegaly. Mild pulmonary vascular prominence with mild interstitial prominence. Mild CHF cannot be excluded. Underlying chronic interstitial lung disease most likely present. Small right pleural effusion cannot be excluded. Left costophrenic angle not imaged. No pneumothorax. No acute bony abnormalities. Degenerative changes  both shoulders. IMPRESSION: 1. Cardiac pacer stable position. Cardiomegaly with mild bilateral from interstitial prominence suggesting mild CHF. Tiny right pleural effusion cannot be excluded. 2. Underlying chronic interstitial lung disease most likely present . Electronically Signed   By: Maisie Fus  Register   On: 04/14/2016 17:00   Dg Knee 1-2 Views Right  Result Date: 04/14/2016 CLINICAL DATA:  Fall. EXAM: RIGHT KNEE - 1-2 VIEW COMPARISON:  04/14/2016. FINDINGS: Soft tissue swelling. Small effusion cannot be excluded. Severe tricompartment degenerative change in osteopenia. Subtle fracture, age undetermined, of the superior aspect of patella cannot be excluded. Bipartite patella could also present in this fashion. Peripheral vascular calcification . IMPRESSION: 1. Soft tissue swelling. Small effusion cannot be excluded. Subtle fracture, age undetermined, superior aspect of the patella. Bipartite patella could also present in this fashion. 2. Diffuse osteopenia and severe degenerative change. 3. Peripheral vascular disease. Electronically Signed   By: Maisie Fus  Register   On: 04/14/2016 17:02   US Aorta  Result Date: 04/15/2016 CLINICAL DATA:  Follow-up of an abdominal aortic aneurysm measuring 5.6 x 5.7 cm seen on CT angiogram of the abdomen pelvis of October 24, 2014 EXAM: ULTRASOUND OF ABDOMINAL AORTA TECHNIQUE: Ultrasound examination of the abdominal aorta was performed to evaluate for abdominal aortic aneurysm. COMPARISON:  CT angiogram of October 24, 2014 FINDINGS: Abdominal Aorta The proximal and mid aorta are obscured by bowel gas. There is an infrarenal abdominal aortic aneurysm measuring 6.7 cm AP x 7.2 cm transversely. There is considerable mural plaque and thrombus. The luminal diameter is approximately 3.5 cm. The aneurysm is 9.2 cm in length. The right common iliac artery measures 1.6 x 1.8 cm. The left common iliac artery measures 2.1 x 1.7 cm. Maximum Diameter: 7.2 cm in the transverse plane.  IMPRESSION: Interval growth of an infrarenal abdominal aortic aneurysm which measures 6.7 cm AP x 7.2 cm transversely. It is 9.2 cm in length and extends into the left common iliac artery. The proximal and mid aorta could not be imaged due to bowel gas and the  patient's clinical condition which limited changes in position. These results will be called to the ordering clinician or representative by the Radiologist Assistant, and communication documented in the PACS or zVision Dashboard. Electronically Signed   By: David  [place] M.D.   On: 04/15/2016 13:42   Dg Chest Port 1 View  Result Date: 04/17/2016 CLINICAL DATA:  Acute onset of respiratory distress. Initial encounter. EXAM: PORTABLE CHEST 1 VIEW COMPARISON:  Chest radiograph performed 04/14/2016 FINDINGS: The lungs are well-aerated. Bibasilar airspace opacities raise concern for pneumonia, though mild interstitial edema could have a similar appearance. No definite pleural effusion or pneumothorax is seen. The cardiomediastinal silhouette is mildly enlarged. A pacemaker is noted overlying the left chest wall, with leads ending overlying the right atrium and right ventricle. No acute osseous abnormalities are seen. IMPRESSION: 1. Bibasilar airspace opacities raise concern for pneumonia, though mild interstitial edema could have a similar appearance. 2. Mild cardiomegaly. Electronically Signed   By: Roanna Raider M.D.   On: 04/17/2016 23:46   Dg Abd Portable 2v  Result Date: 04/17/2016 CLINICAL DATA:  Abdominal distention. EXAM: PORTABLE ABDOMEN - 2 VIEW COMPARISON:  CT abdomen and pelvis 10/24/2014. FINDINGS: No free intraperitoneal air is identified. There is mild gaseous distention of small and large bowel diffusely. Prominent stool burden ascending colon noted. IMPRESSION: Bowel gas pattern most in keeping with ileus. Large stool burden ascending colon. Electronically Signed   By: Drusilla Kanner M.D.   On: 04/17/2016 12:11   Dg C-arm 1-60  Min  Result Date: 04/19/2016 CLINICAL DATA:  Right femur fracture EXAM: DG C-ARM 61-120 MIN; RIGHT FEMUR 2 VIEWS COMPARISON:  04/14/2016 FINDINGS: Fluoroscopy shows intramedullary nail and dynamic hip screw fixation of an intertrochanteric femur fracture. Fracture alignment is significantly improved. The lesser trochanter fragment remains distracted. No evidence of intraoperative fracture. IMPRESSION: Fluoroscopy for intertrochanteric femur fracture fixation. No unexpected finding. Electronically Signed   By: Marnee Spring M.D.   On: 04/19/2016 13:38   Dg Hip Unilat W Or Wo Pelvis 2-3 Views Right  Result Date: 04/14/2016 CLINICAL DATA:  Fall today with right hip pain and deformity EXAM: DG HIP (WITH OR WITHOUT PELVIS) 2-3V RIGHT COMPARISON:  None. FINDINGS: Bones are diffusely demineralized. Comminuted intertrochanteric right femoral neck fracture noted with varus angulation. Lesser trochanter exists as a free fragment. SI joints and symphysis pubis unremarkable. Distal aortic and iliac artery atherosclerosis evident. IMPRESSION: 1. Comminuted intertrochanteric right femoral neck fracture with varus angulation. 2.  Abdominal Aortic Atherosclerois (ICD10-170.0) Electronically Signed   By: Kennith Center M.D.   On: 04/14/2016 17:01   Dg Femur, Min 2 Views Right  Result Date: 04/19/2016 CLINICAL DATA:  Right femur fracture EXAM: DG C-ARM 61-120 MIN; RIGHT FEMUR 2 VIEWS COMPARISON:  04/14/2016 FINDINGS: Fluoroscopy shows intramedullary nail and dynamic hip screw fixation of an intertrochanteric femur fracture. Fracture alignment is significantly improved. The lesser trochanter fragment remains distracted. No evidence of intraoperative fracture. IMPRESSION: Fluoroscopy for intertrochanteric femur fracture fixation. No unexpected finding. Electronically Signed   By: Marnee Spring M.D.   On: 04/19/2016 13:38    Lab Data:  CBC:  Recent Labs Lab 04/14/16 1609 04/15/16 0550 04/18/16 0029 04/19/16 0201  04/19/16 0509 04/20/16 0309 04/21/16 0519  WBC 8.3 9.5 10.7* 7.7  --  8.5 10.0  NEUTROABS 4.5  --  7.4  --   --   --   --   HGB 12.5* 10.6* 9.5* 7.7* 8.8* 9.3* 8.9*  HCT 36.5* 31.5* 28.5* 22.8* 26.6* 27.1*  26.3*  MCV 99.7 100.6* 100.7* 100.0  --  97.5 97.4  PLT 152 137* 149* 140*  --  149* 159   Basic Metabolic Panel:  Recent Labs Lab 04/16/16 0613 04/18/16 0029 04/19/16 0201 04/20/16 0309 04/21/16 0519  NA 136 133* 133* 131* 134*  K 5.6* 4.4 4.6 4.8 4.8  CL 104 105 108 107 108  CO2 23 19* 20* 19* 19*  GLUCOSE 123* 127* 110* 110* 101*  BUN 45* 67* 69* 67* 63*  CREATININE 2.26* 3.03* 2.91* 2.35* 1.94*  CALCIUM 8.8* 8.1* 7.8* 8.1* 8.5*   GFR: Estimated Creatinine Clearance: 27 mL/min (by C-G formula based on SCr of 1.94 mg/dL (H)). Liver Function Tests:  Recent Labs Lab 04/14/16 1609 04/18/16 0029  AST 18 33  ALT 11* 11*  ALKPHOS 44 36*  BILITOT 0.6 0.8  PROT 6.8 5.6*  ALBUMIN 3.6 2.7*   No results for input(s): LIPASE, AMYLASE in the last 168 hours. No results for input(s): AMMONIA in the last 168 hours. Coagulation Profile:  Recent Labs Lab 04/19/16 0819  INR 1.26   Cardiac Enzymes: No results for input(s): CKTOTAL, CKMB, CKMBINDEX, TROPONINI in the last 168 hours. BNP (last 3 results) No results for input(s): PROBNP in the last 8760 hours. HbA1C: No results for input(s): HGBA1C in the last 72 hours. CBG:  Recent Labs Lab 04/14/16 1604  GLUCAP 117*   Lipid Profile: No results for input(s): CHOL, HDL, LDLCALC, TRIG, CHOLHDL, LDLDIRECT in the last 72 hours. Thyroid Function Tests: No results for input(s): TSH, T4TOTAL, FREET4, T3FREE, THYROIDAB in the last 72 hours. Anemia Panel: No results for input(s): VITAMINB12, FOLATE, FERRITIN, TIBC, IRON, RETICCTPCT in the last 72 hours. Urine analysis:    Component Value Date/Time   COLORURINE YELLOW 04/18/2016 0620   APPEARANCEUR CLEAR 04/18/2016 0620   LABSPEC 1.018 04/18/2016 0620   PHURINE 5.0  04/18/2016 0620   GLUCOSEU NEGATIVE 04/18/2016 0620   HGBUR NEGATIVE 04/18/2016 0620   BILIRUBINUR NEGATIVE 04/18/2016 0620   KETONESUR NEGATIVE 04/18/2016 0620   PROTEINUR NEGATIVE 04/18/2016 0620   NITRITE NEGATIVE 04/18/2016 0620   LEUKOCYTESUR NEGATIVE 04/18/2016 1610     RAI,RIPUDEEP M.D. [facility] 04/21/2016, 2:14 PM  Pager: [phone] Between 7am to 7pm - call Pager - [phone]  After 7pm go to [email or web address] - password [email or web address]  Call night coverage person covering after 7pm

## 2016-04-21 NOTE — Clinical Social Work Note (Signed)
Clinical Social Work Assessment  Patient Details  Name: Connor Diaz MRN: [id number] Date of Birth: 07-23-1923  Date of referral:  04/21/16               Reason for consult:  Facility Placement                Permission sought to share information with:  Facility Medical sales representative, Family Supports Permission granted to share information::  Yes, Verbal Permission Granted  Name::     Engineer, structural::  SNFs  Relationship::  Spouse  Contact Information:  [phone]  Housing/Transportation Living arrangements for the past 2 months:  Single Family Home Source of Information:  Patient, Spouse Patient Interpreter Needed:  None Criminal Activity/Legal Involvement Pertinent to Current Situation/Hospitalization:  No - Comment as needed Significant Relationships:  Spouse Lives with:  Spouse Do you feel safe going back to the place where you live?  No Need for family participation in patient care:  Yes (Comment)  Care giving concerns:  CSW received consult for possible SNF placement at time of discharge. CSW spoke with patient's spouse regarding PT recommendation of SNF placement at time of discharge. Patient's spouse reported that she is currently unable to care for patient at their home given patient's current physical needs and fall risk. Patient's spouse expressed understanding of PT recommendation and is agreeable to SNF placement at time of discharge. CSW to continue to follow and assist with discharge planning needs.   Social Worker assessment / plan:  CSW spoke with patient's spouse concerning possibility of rehab at [facility] before returning home.  Employment status:  Retired Health and safety inspector:  Medicare PT Recommendations:  Skilled Nursing Facility Information / Referral to community resources:  Skilled Nursing Facility  Patient/Family's Response to care:  Patient recognizes need for rehab before returning home and is agreeable to a SNF in [place]. Patient's spouse  reported preference for [facility].  Patient/Family's Understanding of and Emotional Response to Diagnosis, Current Treatment, and Prognosis:  Patient/family is realistic regarding therapy needs and expressed being hopeful for SNF placement. Patient's spouse expressed understanding of CSW role and discharge process. She reported being upset with patient's condition but she is hopeful for a quick turn around. No questions/concerns about plan or treatment.    Emotional Assessment Appearance:  Appears stated age Attitude/Demeanor/Rapport:  Unable to Assess Affect (typically observed):  Unable to Assess Orientation:  Oriented to Self, Oriented to Place, Oriented to Situation Alcohol / Substance use:  Not Applicable Psych involvement (Current and /or in the community):     Discharge Needs  Concerns to be addressed:  Care Coordination Readmission within the last 30 days:  No Current discharge risk:  None Barriers to Discharge:  Continued Medical Work up   [employer], LCSWA 04/21/2016, 4:01 PM

## 2016-04-21 NOTE — Progress Notes (Signed)
Subjective:  Doing well denies any chest pain or shortness of breath. Renal function gradually improving. Remains in A. fib with controlled ventricular response  Objective:  Vital Signs in the last 24 hours: Temp:  [97.8 F (36.6 C)-98.7 F (37.1 C)] 97.8 F (36.6 C) (03/06 0800) Pulse Rate:  [78-98] 83 (03/06 0800) Resp:  [11-20] 18 (03/06 0800) BP: (98-122)/(74-80) 113/77 (03/06 0745) SpO2:  [94 %-100 %] 98 % (03/06 0800)  Intake/Output from previous day: 03/05 0701 - 03/06 0700 In: 600 [P.O.:600] Out: 250 [Urine:250] Intake/Output from this shift: No intake/output data recorded.  Physical Exam: Neck: no adenopathy, no carotid bruit, no JVD and supple, symmetrical, trachea midline Lungs: clear to auscultation bilaterally Heart: irregularly irregular rhythm, S1, S2 normal and Soft systolic murmur noted Abdomen: soft, non-tender; bowel sounds normal; no masses,  no organomegaly Extremities: extremities normal, atraumatic, no cyanosis or edema  Lab Results:  Recent Labs  04/20/16 0309 04/21/16 0519  WBC 8.5 10.0  HGB 9.3* 8.9*  PLT 149* 159    Recent Labs  04/20/16 0309 04/21/16 0519  NA 131* 134*  K 4.8 4.8  CL 107 108  CO2 19* 19*  GLUCOSE 110* 101*  BUN 67* 63*  CREATININE 2.35* 1.94*   No results for input(s): TROPONINI in the last 72 hours.  Invalid input(s): CK, MB Hepatic Function Panel No results for input(s): PROT, ALBUMIN, AST, ALT, ALKPHOS, BILITOT, BILIDIR, IBILI in the last 72 hours. No results for input(s): CHOL in the last 72 hours. No results for input(s): PROTIME in the last 72 hours.  Imaging: Imaging results have been reviewed and No results found.  Cardiac Studies:  Assessment/Plan:  Comminuted right trochanteric femoral neck fracture, status post fallstatus post intramedullary nail right hip postoperative 2 doing well Multivessel coronary artery disease, history of silent inferior wall MI and remote past, status post multivessel  PCI to LAD, left circumflex and obtuse marginal 3 in November 2013. Ischemic cardiomyopathy. Tachybradycardia syndrome status post permanent pacemaker in the past History of congestive heart failure secondary to depressed LV systolic function. Critical abdominal aortic aneurysm.being followed at [facility] Chronic atrial fibrillation chads vasc score of 5 on chronic anticoagulation Acute on chronic kidney disease stage III./Hypovolemia secondary to hypotension and hypovolemia and ACE inhibitor Chronic anemia Hypotension Hyperlipidemia. Hypothyroidism. Degenerative joint disease. Protein calorie malnutrition Plan Restart carvedilol as blood pressure tolerates Restart Ace inhibitors once renal function back towards baseline Out of bed as tolerated I will sign off please call if needed  LOS: 7 days    Connor Diaz 04/21/2016, 10:12 AM

## 2016-04-21 NOTE — Progress Notes (Signed)
PT Cancellation Note  Patient Details Name: Connor Diaz MRN: [id number] DOB: [birth year]   Cancelled Treatment:    Reason Eval/Treat Not Completed: Fatigue limiting ability to participate. Pt remains fatigued after sitting EOB earlier with OT. Will follow up at later date.   Angelina Ok Maycok 04/21/2016, 2:50 PM [employer] PT [phone]

## 2016-04-21 NOTE — Progress Notes (Signed)
PT Cancellation Note  Patient Details Name: Connor Diaz MRN: [id number] DOB: [birth year]   Cancelled Treatment:    Reason Eval/Treat Not Completed: Other (comment). Pt fatigued after OT.    Angelina Ok Maycok 04/21/2016, 10:48 AM Skip Mayer PT [phone]

## 2016-04-21 NOTE — Evaluation (Signed)
Occupational Therapy Evaluation Patient Details Name: Connor Diaz MRN: [id number] DOB: 27-Aug-1923 Today's Date: 04/21/2016    History of Present Illness Pt adm to [facility] with rt intertrochanteric hip fx on 2/27. Transferred to [facility] due to cardiac history. Worsening renal function delayed surgery and he underwent ORIF with IM rod on 3/4. PMH - AAA, ckd, nonischemic cardiomyopathy, htn, pacer, chf   Clinical Impression   Pt with decline in function and safety with ADLs and ADL mobility with decreased strength, balance and endurance. Pt is also limited by R LE pain during movement. Pt would benefit from acute OT services to address impairments to increase level of function and safety    Follow Up Recommendations  SNF    Equipment Recommendations  None recommended by OT;Other (comment) (TBD at next venue of care)    Recommendations for Other Services       Precautions / Restrictions Precautions Precautions: Fall Restrictions Weight Bearing Restrictions: Yes RLE Weight Bearing: Weight bearing as tolerated      Mobility Bed Mobility Overal bed mobility: Needs Assistance Bed Mobility: Supine to Sit;Sit to Supine     Supine to sit: Max assist Sit to supine: Mod assist;+2 for physical assistance   General bed mobility comments: Assist to move RLE, elevate trunk into sitting, and bring hips to EOB using pad. Pt helping as much as he was able  Transfers                 General transfer comment: NT, requires mechanical lift    Balance Overall balance assessment: Needs assistance Sitting-balance support: Bilateral upper extremity supported;Feet supported Sitting balance-Leahy Scale: Poor                                      ADL Overall ADL's : Needs assistance/impaired     Grooming: Wash/dry hands;Wash/dry face;Minimal assistance Grooming Details (indicate cue type and reason): Poor sitting balance Upper Body Bathing: Moderate  assistance Upper Body Bathing Details (indicate cue type and reason): Poor sitting balance Lower Body Bathing: Total assistance   Upper Body Dressing : Moderate assistance Upper Body Dressing Details (indicate cue type and reason): Poor sitting balance Lower Body Dressing: Total assistance     Toilet Transfer Details (indicate cue type and reason): NT, requires mechanical lift Toileting- Clothing Manipulation and Hygiene: Bed level;Total assistance       Functional mobility during ADLs: +2 for physical assistance;+2 for safety/equipment;Moderate assistance (bed mobility) General ADL Comments: pt sat EOB x 18 minutes     Vision   Vision Assessment?: No apparent visual deficits                Pertinent Vitals/Pain Pain Assessment: Faces Faces Pain Scale: Hurts even more Pain Location: rt hip with movement Pain Descriptors / Indicators: Grimacing;Guarding Pain Intervention(s): Limited activity within patient's tolerance;Monitored during session;Premedicated before session;Repositioned     Hand Dominance Right   Extremity/Trunk Assessment Upper Extremity Assessment Upper Extremity Assessment: Generalized weakness   Lower Extremity Assessment Lower Extremity Assessment: Defer to PT evaluation       Communication Communication Communication: HOH   Cognition Arousal/Alertness: Awake/alert Behavior During Therapy: WFL for tasks assessed/performed Overall Cognitive Status: Impaired/Different from baseline Area of Impairment: Memory Orientation Level: Disoriented to;Time   Memory: Decreased short-term memory             General Comments   pt very pleasant and cooperative  Home Living Family/patient expects to be discharged to:: Private residence Living Arrangements: Spouse/significant other Available Help at Discharge: Family;Available 24 hours/day Type of Home: House Home Access: Stairs to enter [employer] of Steps:  1 Entrance Stairs-Rails: None       Bathroom Shower/Tub: Chief Strategy Officer: Standard     Home Equipment: Environmental consultant - 2 wheels;Cane - single point          Prior Functioning/Environment Level of Independence: Independent with assistive device(s)                 OT Problem List: Decreased strength;Impaired balance (sitting and/or standing);Pain;Decreased activity tolerance;Decreased knowledge of use of DME or AE      OT Treatment/Interventions: Self-care/ADL training;DME and/or AE instruction;Therapeutic activities;Patient/family education    OT Goals(Current goals can be found in the care plan section) Acute Rehab OT Goals Patient Stated Goal: return to prior level of function OT Goal Formulation: With patient/family Time For Goal Achievement: 04/28/16 Potential to Achieve Goals: Good ADL Goals Pt Will Perform Grooming: with min guard assist;sitting Pt Will Perform Upper Body Bathing: with min assist;sitting Pt Will Perform Lower Body Bathing: with max assist;with mod assist;sitting/lateral leans Pt Will Perform Upper Body Dressing: with min assist;sitting Pt Will Transfer to Toilet: with max assist;with +2 assist;bedside commode  OT Frequency: Min 2X/week   Barriers to D/C: Decreased caregiver support                        End of Session    Activity Tolerance: Patient limited by fatigue;Patient limited by pain Patient left: in bed;with call bell/phone within reach;with family/visitor present  OT Visit Diagnosis: Muscle weakness (generalized) (M62.81);Pain;Other abnormalities of gait and mobility (R26.89) Pain - Right/Left: Right Pain - part of body: Hip;Leg                ADL either performed or assessed with clinical judgement  Time: 0952-1036 OT Time Calculation (min): 44 min Charges:  OT Evaluation $OT Eval Moderate Complexity: 1 Procedure OT Treatments $Self Care/Home Management : 8-22 mins $Therapeutic Activity: 8-22  mins G-Codes: OT G-codes **NOT FOR INPATIENT CLASS** Functional Assessment Tool Used: AM-PAC 6 Clicks Daily Activity     Connor Diaz 04/21/2016, 12:24 PM

## 2016-04-22 ENCOUNTER — Inpatient Hospital Stay (HOSPITAL_COMMUNITY): Payer: Medicare Other

## 2016-04-22 LAB — SYNOVIAL CELL COUNT + DIFF, W/ CRYSTALS
Crystals, Fluid: NONE SEEN
EOSINOPHILS-SYNOVIAL: 0 % (ref 0–1)
Lymphocytes-Synovial Fld: 22 % — ABNORMAL HIGH (ref 0–20)
MONOCYTE-MACROPHAGE-SYNOVIAL FLUID: 56 % (ref 50–90)
Neutrophil, Synovial: 22 % (ref 0–25)
WBC, SYNOVIAL: 13 /mm3 (ref 0–200)

## 2016-04-22 LAB — BASIC METABOLIC PANEL
ANION GAP: 9 (ref 5–15)
BUN: 64 mg/dL — ABNORMAL HIGH (ref 6–20)
CHLORIDE: 104 mmol/L (ref 101–111)
CO2: 20 mmol/L — AB (ref 22–32)
Calcium: 8.6 mg/dL — ABNORMAL LOW (ref 8.9–10.3)
Creatinine, Ser: 1.71 mg/dL — ABNORMAL HIGH (ref 0.61–1.24)
GFR calc non Af Amer: 33 mL/min — ABNORMAL LOW (ref >60)
GFR, EST AFRICAN AMERICAN: 39 mL/min — AB (ref >60)
GLUCOSE: 97 mg/dL (ref 65–99)
Potassium: 5.2 mmol/L — ABNORMAL HIGH (ref 3.5–5.1)
Sodium: 133 mmol/L — ABNORMAL LOW (ref 135–145)

## 2016-04-22 LAB — CBC
HEMATOCRIT: 27.3 % — AB (ref 39.0–52.0)
HEMOGLOBIN: 9 g/dL — AB (ref 13.0–17.0)
MCH: 32.5 pg (ref 26.0–34.0)
MCHC: 33 g/dL (ref 30.0–36.0)
MCV: 98.6 fL (ref 78.0–100.0)
Platelets: 176 10*3/uL (ref 150–400)
RBC: 2.77 MIL/uL — ABNORMAL LOW (ref 4.22–5.81)
RDW: 15.4 % (ref 11.5–15.5)
WBC: 9.7 10*3/uL (ref 4.0–10.5)

## 2016-04-22 LAB — URIC ACID: URIC ACID, SERUM: 10 mg/dL — AB (ref 4.4–7.6)

## 2016-04-22 LAB — BRAIN NATRIURETIC PEPTIDE: B Natriuretic Peptide: 596.7 pg/mL — ABNORMAL HIGH (ref 0.0–100.0)

## 2016-04-22 LAB — C-REACTIVE PROTEIN: CRP: 9.7 mg/dL — ABNORMAL HIGH (ref <1.0)

## 2016-04-22 MED ORDER — IPRATROPIUM-ALBUTEROL 0.5-2.5 (3) MG/3ML IN SOLN
3.0000 mL | Freq: Three times a day (TID) | RESPIRATORY_TRACT | Status: DC
  Administered 2016-04-23: 3 mL via RESPIRATORY_TRACT
  Filled 2016-04-22: qty 3

## 2016-04-22 MED ORDER — FUROSEMIDE 10 MG/ML IJ SOLN
40.0000 mg | Freq: Once | INTRAMUSCULAR | Status: AC
  Administered 2016-04-22: 40 mg via INTRAVENOUS
  Filled 2016-04-22: qty 4

## 2016-04-22 MED ORDER — ENSURE ENLIVE PO LIQD
237.0000 mL | Freq: Two times a day (BID) | ORAL | Status: DC
  Administered 2016-04-22: 237 mL via ORAL

## 2016-04-22 MED ORDER — FUROSEMIDE 40 MG PO TABS
40.0000 mg | ORAL_TABLET | Freq: Every day | ORAL | Status: DC
  Administered 2016-04-23: 40 mg via ORAL
  Filled 2016-04-22: qty 1

## 2016-04-22 MED ORDER — FUROSEMIDE 40 MG PO TABS: 40.0000 mg | ORAL_TABLET | Freq: Every day | ORAL | Status: DC

## 2016-04-22 MED ORDER — PREDNISONE 20 MG PO TABS
40.0000 mg | ORAL_TABLET | Freq: Every day | ORAL | Status: DC
Start: 2016-04-22 — End: 2016-04-23
  Administered 2016-04-22 – 2016-04-23 (×2): 40 mg via ORAL
  Filled 2016-04-22 (×2): qty 2

## 2016-04-22 MED ORDER — BOOST / RESOURCE BREEZE PO LIQD: 1.0000 | Freq: Every morning | ORAL | Status: DC

## 2016-04-22 MED ORDER — SODIUM CHLORIDE 0.9 % IV SOLN
1.0000 g | Freq: Once | INTRAVENOUS | Status: AC
  Administered 2016-04-22: 1 g via INTRAVENOUS
  Filled 2016-04-22: qty 10

## 2016-04-22 MED ORDER — IPRATROPIUM-ALBUTEROL 0.5-2.5 (3) MG/3ML IN SOLN
3.0000 mL | Freq: Four times a day (QID) | RESPIRATORY_TRACT | Status: DC
  Administered 2016-04-22 (×3): 3 mL via RESPIRATORY_TRACT
  Filled 2016-04-22 (×3): qty 3

## 2016-04-22 NOTE — Progress Notes (Signed)
Physical Therapy Treatment Patient Details Name: Connor Diaz MRN: [id number] DOB: Nov 17, 1923 Today's Date: 04/22/2016    History of Present Illness Pt adm to [facility] with rt intertrochanteric hip fx on 2/27. Transferred to [facility] due to cardiac history. Worsening renal function delayed surgery and he underwent ORIF with IM rod on 3/4. PMH - AAA, ckd, nonischemic cardiomyopathy, htn, pacer, chf    PT Comments    Pt making good progress. Able to stand today. Had to return to bed due to xray coming to xray knee.   Follow Up Recommendations  SNF     Equipment Recommendations  Other (comment) (to be assessed)    Recommendations for Other Services       Precautions / Restrictions Precautions Precautions: Fall Restrictions Weight Bearing Restrictions: Yes RLE Weight Bearing: Weight bearing as tolerated    Mobility  Bed Mobility Overal bed mobility: Needs Assistance Bed Mobility: Supine to Sit;Sit to Supine     Supine to sit: Mod assist;HOB elevated Sit to supine: +2 for physical assistance;Mod assist   General bed mobility comments: Assist to bring RLE off of bed, elevate trunk into sitting, and bring hips to EOB. Assist to lower trunk and bring legs back up into bed.  Transfers Overall transfer level: Needs assistance Equipment used: Rolling walker (2 wheeled) Transfers: Sit to/from Stand Sit to Stand: +2 physical assistance;Mod assist         General transfer comment: Assist to bring hips and trunk up. allowed pt to place hands on walker to encourage anterior weight shift. Used bed pad under pt's hips to assist. Pt able to achieve full standing.  Ambulation/Gait                 Stairs            Wheelchair Mobility    Modified Rankin (Stroke Patients Only)       Balance Overall balance assessment: Needs assistance Sitting-balance support: Single extremity supported;Feet supported Sitting balance-Leahy Scale: Poor Sitting balance -  Comments: Requires UE support to maintain sitting.   Standing balance support: Bilateral upper extremity supported Standing balance-Leahy Scale: Poor Standing balance comment: Stood x 2 with walker x 45-60 sec with +2 mod A                    Cognition Arousal/Alertness: Awake/alert Behavior During Therapy: WFL for tasks assessed/performed Overall Cognitive Status: Impaired/Different from baseline Area of Impairment: Memory     Memory: Decreased short-term memory              Exercises      General Comments        Pertinent Vitals/Pain Pain Assessment: Faces Faces Pain Scale: Hurts even more Pain Location: rt hip with movement Pain Descriptors / Indicators: Grimacing;Guarding Pain Intervention(s): Limited activity within patient's tolerance;Monitored during session;Repositioned    Home Living                      Prior Function            PT Goals (current goals can now be found in the care plan section) Progress towards PT goals: Progressing toward goals    Frequency    Min 3X/week      PT Plan Current plan remains appropriate    Co-evaluation             End of Session Equipment Utilized During Treatment: Gait belt;Oxygen Activity Tolerance: Patient tolerated treatment well Patient left:  in bed;with call bell/phone within reach;with family/visitor present Nurse Communication: Mobility status;Need for lift equipment PT Visit Diagnosis: Difficulty in walking, not elsewhere classified (R26.2)     Time: 6147-0929 PT Time Calculation (min) (ACUTE ONLY): 27 min  Charges:  $Therapeutic Activity: 23-37 mins                    G CodesAngelina Ok [facility] 05-01-2016, 9:59 AM [facility] PT [phone]

## 2016-04-22 NOTE — Progress Notes (Signed)
OT Cancellation Note  Patient Details Name: Connor Diaz MRN: [id number] DOB: [birth year]   Cancelled Treatment:    Reason Eval/Treat Not Completed: Fatigue/lethargy limiting ability to participate;Patient at procedure or test/ unavailable (x ray). Will check back tomorrow  Galen Manila 04/22/2016, 9:35 AM

## 2016-04-22 NOTE — Progress Notes (Signed)
Nutrition Follow-up  DOCUMENTATION CODES:   Non-severe (moderate) malnutrition in context of chronic illness  INTERVENTION:   -Decrease Boost Breeze po to daily, each supplement provides 250 kcal and 9 grams of protein -Ensure Enlive po BID, each supplement provides 350 kcal and 20 grams of protein -Continue MVI  NUTRITION DIAGNOSIS:   Increased nutrient needs related to  (post-op healing) as evidenced by estimated needs.  Ongoing  GOAL:   Patient will meet greater than or equal to 90% of their needs  Progressing  MONITOR:   PO intake, Supplement acceptance, Labs, Weight trends, Skin, I & O's  REASON FOR ASSESSMENT:   Consult Hip fracture protocol  ASSESSMENT:   Patient is a 83 year old male with medical history significant of AAA; afib, on Eliquis; BPH; CKD stage 3; HTN; hypothyroidism; bradycardia s/p pacemaker placement; and CHF (EF 20-25% in 8/17) who originally presented to [facility] with right hip fracture on 04/14/16 after a mechanical fall.   3/1- transferred from [facility] to [facility]  S/p Procedure(s) 04/19/16: INTRAMEDULLARY (IM) NAIL INTERTROCHANTRIC (Right)  Case discussed with RN, who reports pt eats some food off of his trays, but also eats outside food brought in by family. Plan is to involve CSW to discharge to short term SNF.   Spoke with pt and family members at bedside. Pt consumed a glass of orange juice and a breakfast biscuit from [employer] this AM. Pt shares that his appetite has been steadily increasing; pt wife shares poor oral intake over the past 3-4 days related to sedation. Pt with Boost Breeze supplement at bedside, which he enjoys. Pt wife asking about other supplements available (Boost Breeze vs Ensure). Pt amenable to trial variety of supplements to increase acceptance.   Discussed importance of good PO and supplement intake to promote healing.   Labs reviewed: Na: 134 (on IV supplementation).   Diet Order:  Diet Heart Room service  appropriate? Yes; Fluid consistency: Thin  Skin:   (rt hip incision)  Last BM:  04/21/16  Height:   Ht Readings from Last 1 Encounters:  04/18/16 5\' 11"  (1.803 m)    Weight:   Wt Readings from Last 1 Encounters:  04/19/16 181 lb 12.8 oz (82.5 kg)    Ideal Body Weight:  75 kg  BMI:  Body mass index is 25.36 kg/m.  Estimated Nutritional Needs:   Kcal:  1900-2100 kcal  Protein:  68-72 gr  Fluid:  1.9 liters daily   EDUCATION NEEDS:   No education needs identified at this time  Addisen Chappelle A. Mayford Knife, RD, LDN, CDE Pager: [phone] After hours Pager: [phone]

## 2016-04-22 NOTE — Progress Notes (Addendum)
Addendum: Our office received a call from [facility] M.D. regarding pain and effusion of the patient's right knee.  X-ray (results below) shows tricompartmental changes with effusion without fracture, subluxation, or dislocation. Uric acid also elevated. I discussed the results of the x-ray and labs with the patient and his family. This discussion included possibility of therapeutic/diagnostic aspiration with corticosteroid injection of his right knee.  He verbalized understanding and wishes to proceed with same.  PATIENT:  Connor Diaz    PROCEDURE:  Intra-articular aspiration / injection right knee  PROCEDURE DETAILS: After informed verbal consent was obtained the superolateral portal was prepped with chlorhexidine and anesthetized with ethyl chloride spray. Approximately 8 mL's of lidocaine were injected superficially and then to the deeper tissues and into the capsule. Flash within the syringe showed appropriate positioning and pressurized joint space. This was left to take effect for several minutes. Next, an 18-gauge needle with a 60 mL syringe was used to aspirate approximately 42 mL's of serosanguineous fluid. No fat appreciated suggesting no intra-articular fracture that could be missed on x-ray. The needle was left in place and 4 ml of Marcaine and 40 mg of Depo-Medrol was injected. He tolerated the procedure well without complication. Band-Aid was placed.  HCM, PA-C 04/22/2016 6:56 PM   EXAM: PORTABLE RIGHT KNEE - 1-2 VIEW  COMPARISON:  04/14/2016  FINDINGS: The distal portion of a right femoral IM nail is visualized. No hardware complicating feature. Advanced degenerative joint disease changes with joint space narrowing and spurring, most pronounced in the patellofemoral compartment. Small joint effusion. No acute fracture, subluxation or dislocation.  IMPRESSION: Postoperative changes. Advanced degenerative changes. Note findings.  Assessment: 3 Days Post-Op    S/P Procedure(s) (LRB): INTRAMEDULLARY (IM) NAIL INTERTROCHANTRIC (Right) by Dr. Jewel Baize. Eulah Pont on 04/19/16  Principal Problem:   Intertrochanteric fracture of right hip [facility]) Active Problems:   Aneurysm of abdominal vessel (HCC)   Non-ischemic cardiomyopathy (HCC)   Paroxysmal atrial fibrillation (HCC)   Hip fracture (HCC)   Diarrhea   CKD (chronic kidney disease) stage 3, GFR 30-59 ml/min   Malnutrition of moderate degree  Continued improvement in mobility.  Less sore.  Okay for SNF from an orthopedic perspective.  Plan: Continue therapy Continue care per Medicine  Weight Bearing: Weight Bearing as Tolerated (WBAT) Right Leg Dressings: Maintain Mepilex.  Change prn.   VTE prophylaxis: Eliquis, SCDs, ambulation Dispo: Per primary Skilled Nursing Facility/Rehab.    Follow up in the office with Dr. Wandra Feinstein in about 2 weeks.  Please call with questions.  Subjective: Patient reports pain as mild / improving. Pain controlled.  Objective:   VITALS:   Vitals:   04/21/16 1900 04/21/16 2353 04/22/16 0530 04/22/16 0727  BP: 120/82 (!) 154/87 121/64 137/83  Pulse: 81 83 74 78  Resp: 17 18 16 16   Temp: 97.9 F (36.6 C) 97.4 F (36.3 C) 97.8 F (36.6 C) 97.9 F (36.6 C)  TempSrc: Oral Oral Oral Oral  SpO2: 100% 100% 100% 99%  Weight:      Height:       CBC Latest Ref Rng & Units 04/22/2016 04/21/2016 04/20/2016  WBC 4.0 - 10.5 K/uL 9.7 10.0 8.5  Hemoglobin 13.0 - 17.0 g/dL 9.0(L) 8.9(L) 9.3(L)  Hematocrit 39.0 - 52.0 % 27.3(L) 26.3(L) 27.1(L)  Platelets 150 - 400 K/uL 176 159 149(L)   BMP Latest Ref Rng & Units 04/22/2016 04/21/2016 04/20/2016  Glucose 65 - 99 mg/dL 97 161(W) 960(A)  BUN 6 - 20 mg/dL  64(H) 63(H) 67(H)  Creatinine 0.61 - 1.24 mg/dL 8.36(O) 2.94(T) 6.54(Y)  Sodium 135 - 145 mmol/L 133(L) 134(L) 131(L)  Potassium 3.5 - 5.1 mmol/L 5.2(H) 4.8 4.8  Chloride 101 - 111 mmol/L 104 108 107  CO2 22 - 32 mmol/L 20(L) 19(L) 19(L)  Calcium 8.9 - 10.3 mg/dL 5.0(P) 5.4(S)  8.1(L)   Intake/Output      03/06 0701 - 03/07 0700 03/07 0701 - 03/08 0700   P.O. 480    Total Intake(mL/kg) 480 (5.8)    Urine (mL/kg/hr) 150 (0.1) 1025 (13.9)   Total Output 150 1025   Net +330 -1025          Physical Exam: General: NAD.  No increased wob.  Conversant, upright in bed. MSK RLE: Right knee swollen with effusion.  Non-tender to palpation.  No erythema or lesion. Sensation intact distally Feet warm Dorsiflexion/Plantar flexion intact Incision: dressing C/D/I  Albina Billet III, PA-C 04/22/2016, 7:54 AM

## 2016-04-22 NOTE — Progress Notes (Signed)
[facility]                                                                              Patient Demographics  Connor Diaz, is a 83 y.o. male, DOB - 1923-11-30, ZOX:[id number]  Admit date - 04/14/2016   Admitting Physician Jonah Blue, MD  Outpatient Primary MD for the patient is [name], MD  Outpatient specialists:   LOS - 8  days    Chief Complaint  Patient presents with  . Dizziness  . Fall       Brief summary   Patient is a 83 year old male with medical history significant of AAA; afib, on Eliquis; BPH; CKD stage 3; HTN; hypothyroidism; bradycardia s/p pacemaker placement; and CHF (EF 20-25% in 8/17) who originally presented to [facility] with right hip fracture on 04/14/16 after a mechanical fall. Patient was seen by orthopedics, Dr. Romeo Apple however due to his significant cardiac history, cardiologist (Dr [name]) and other medical issues, Dr. Romeo Apple recommended transfer to [facility] for orthopedics surgery and medical management.   Assessment & Plan    Principal Problem:  Right Hip fracture (HCC) - Patient transferred from [facility] hospital due to significant cardiac history - 2-D echo 2/28 showed EF of 25-30% with diffuse hypokinesis in the basal and mid inferolateral myocardium - Postop day # 3 s/p intramedullary nail surgery -  H&H stable  Active Problems:   AAA/ Aneurysm of abdominal vessel (HCC) - Ultrasound abdominal done, showed Interval growth of an infrarenal abdominal aortic aneurysm which measures 6.7 cm AP x 7.2 cm transversely. - Patient was seen on 05/22/15 in [facility], seen by Dr Pattricia Boss, at that time AAA measured 6.1 x 6.2 cm, patient was recommended to follow up. Another note on 06/24/15 stated that he was not interested in moving forward with surgery due to potential impact on his quality of life - Vasc surgery consulted, Dr. Darrick Penna discussed in detail with patient and wife in detail,  recommended outpatient electively follow up with Dr. Pattricia Boss at [facility] if he recovers from his current acute medical problems, if patient has symptoms of rupture during the hospital stay, would consider open repair   Acute on chronic systolic CHF - Currently wheezing and bibasilar crackles 2-D echo on 2/28 had shown EF of 25-30% with diffuse hypokinesis. Patient is on Lasix at home, had been held due to acute on chronic kidney disease. Patient had received IV fluids during hospitalization - 6 L positive, BNP elevated at 596, given Lasix 40 mg IV 1 - will reassess in a.m., if he needs IV Lasix versus restart oral Lasix in a.m. at outpatient dose  Right knee effusion with pain - Elevated CRP, uric acid 10 - Unable to place on NSAIDs, possibly acute gout, will place on prednisone - Right knee portable x-ray shows small joint effusion, will also discuss with orthopedics    Non-ischemic cardiomyopathy (HCC), Paroxysmal atrial fibrillation (HCC) - Rate controlled, with pacer, on amiodarone - CHADvasc score 5 - restarted eliquis     Acute on CKD (chronic kidney disease) stage 3, GFR 30-59 ml/min - Baseline creatinine ~1.7 -  Per family, had not been eating well in the last few days -Nephrology following, renal function is improving   Acute on Chronic anemia  - monitor closely, baseline 12 - hb dropped to 7.7 on 3/4 unclear etiology, ?hemodilution, stat CT abd did not show any hematoma.  - Currently H&H stable,  Constipation Resolved   hyperkalemia - Given calcium gluconate, started on Lasix, will hopefully improve   Code Status: DNR  DVT Prophylaxis:  Restarted eliquis  Family Communication: Discussed in detail with the patient, all imaging results, lab results explained to the patient and wife   Disposition Plan: Likely in a.m. if improving  Time Spent in minutes   25 minutes  Procedures:  2-D echo  Consultants:   Cardiology Orthopedics  nephrology Vascular  surgery  Antimicrobials:      Medications  Scheduled Meds: . sodium chloride  10 mL/hr Intravenous Once  . acetaminophen  1,000 mg Oral TID  . amiodarone  100 mg Oral Daily  . apixaban  2.5 mg Oral BID  . atorvastatin  40 mg Oral q1800  . famotidine  20 mg Oral QPM  . feeding supplement  1 Container Oral TID BM  . finasteride  5 mg Oral QPM  . ipratropium-albuterol  3 mL Nebulization Q6H  . levothyroxine  75 mcg Oral QAC breakfast  . multivitamin with minerals  1 tablet Oral Daily  . mupirocin ointment  1 application Nasal BID  . senna-docusate  1 tablet Oral BID  . sodium chloride  500 mL Intravenous Once   Continuous Infusions: . sodium chloride 10 mL (04/19/16 0440)   PRN Meds:.HYDROcodone-acetaminophen, methocarbamol **OR** [DISCONTINUED] methocarbamol (ROBAXIN)  IV, morphine injection, ondansetron (ZOFRAN) IV, ondansetron, polyethylene glycol   Antibiotics   Anti-infectives    Start     Dose/Rate Route Frequency Ordered Stop   04/19/16 0600  ceFAZolin (ANCEF) IVPB 2g/100 mL premix     2 g 200 mL/hr over 30 Minutes Intravenous On call to O.R. 04/18/16 1939 04/19/16 0956        Subjective:   Connor Diaz was seen and examined today. Complaining of right knee pain, 8 out of 10, tender and swollen. Had a large bowel movement. Mental status much better today. No chest pain.  No abdominal pain.  No nausea or vomiting.  Patient denies dizziness,  new weakness, numbess, tingling.   Objective:   Vitals:   04/22/16 0530 04/22/16 0727 04/22/16 0914 04/22/16 1159  BP: 121/64 137/83  131/73  Pulse: 74 78  (!) 45  Resp: 16 16  19   Temp: 97.8 F (36.6 C) 97.9 F (36.6 C)  97.7 F (36.5 C)  TempSrc: Oral Oral  Oral  SpO2: 100% 99% 100% 94%  Weight:      Height:        Intake/Output Summary (Last 24 hours) at 04/22/16 1401 Last data filed at 04/22/16 1230  Gross per 24 hour  Intake              360 ml  Output             2375 ml  Net            -2015 ml      Wt Readings from Last 3 Encounters:  04/19/16 82.5 kg (181 lb 12.8 oz)  04/14/16 79 kg (174 lb 1.6 oz)  10/23/15 68 kg (150 lb)     Exam  General: Alert and oriented x3  HEENT:    Neck: Supple, no  JVD  Cardiovascular: S1 S2 clear, irregularly regular   Respiratory: Bilateral wheezing and bibasilar crackles  Gastrointestinal: Soft, nontender, ND , + bowel sounds  Ext: no cyanosis clubbing or edema, right knee swollen and tender  Neuro:no new deficits   Skin: No rashes  Psych: alert and oriented 3   Data Reviewed:  I have personally reviewed following labs and imaging studies  Micro Results Recent Results (from the past 240 hour(s))  Surgical PCR screen     Status: Abnormal   Collection Time: 04/18/16 12:30 AM  Result Value Ref Range Status   MRSA, PCR NEGATIVE NEGATIVE Final   Staphylococcus aureus POSITIVE (A) NEGATIVE Final    Comment:        The Xpert SA Assay (FDA approved for NASAL specimens in patients over 32 years of age), is one component of a comprehensive surveillance program.  Test performance has been validated by [facility] for patients greater than or equal to 25 year old. It is not intended to diagnose infection nor to guide or monitor treatment.   Surgical pcr screen     Status: Abnormal   Collection Time: 04/18/16  9:14 PM  Result Value Ref Range Status   MRSA, PCR NEGATIVE NEGATIVE Final   Staphylococcus aureus POSITIVE (A) NEGATIVE Final    Comment:        The Xpert SA Assay (FDA approved for NASAL specimens in patients over 37 years of age), is one component of a comprehensive surveillance program.  Test performance has been validated by [facility] for patients greater than or equal to 39 year old. It is not intended to diagnose infection nor to guide or monitor treatment.     Radiology Reports Ct Abdomen Pelvis Wo Contrast  Result Date: 04/19/2016 CLINICAL DATA:  Status post acute right hip fracture. Patient on  anticoagulation for atrial fibrillation. Acute on chronic anemia. Assess for right hip hematoma or retroperitoneal hemorrhage. Initial encounter. EXAM: CT ABDOMEN AND PELVIS WITHOUT CONTRAST TECHNIQUE: Multidetector CT imaging of the abdomen and pelvis was performed following the standard protocol without IV contrast. COMPARISON:  CT of the abdomen and pelvis from 10/24/2014 FINDINGS: Lower chest: Trace bilateral pleural effusions are noted. Bilateral emphysema is seen, with underlying bibasilar fibrotic change. Diffuse coronary artery calcifications are seen. A pacemaker lead is partially imaged. Hepatobiliary: The liver is unremarkable in appearance. The gallbladder is unremarkable in appearance. The common bile duct remains normal in caliber. Pancreas: The pancreas is diffusely atrophic and grossly unremarkable. Spleen: The spleen is unremarkable in appearance. Adrenals/Urinary Tract: The adrenal glands are unremarkable in appearance. Nonspecific perinephric stranding is noted bilaterally. A small hyperdense cyst is noted at the anterior aspect of the right kidney. A small 3 mm calcification is noted at the upper pole of the right kidney. There is no evidence of hydronephrosis. No obstructing ureteral stones are seen. Stomach/Bowel: The stomach is unremarkable in appearance. The small bowel is within normal limits. The appendix is not visualized; there is no evidence for appendicitis. The colon is unremarkable in appearance. Vascular/Lymphatic: There is aneurysmal dilatation of the infrarenal abdominal aorta to 6.4 cm in AP dimension and 6.5 cm in transverse dimension. Diffuse calcification is seen along the abdominal aorta and its branches, including along the superior mesenteric artery. Aneurysmal dilatation resolves at the aortic bifurcation. There is diffuse calcification along the common iliac arteries, external and internal iliac arteries, and common femoral arteries bilaterally. No retroperitoneal or  pelvic sidewall lymphadenopathy is seen. Reproductive: The bladder is  mildly distended and grossly unremarkable. The prostate is enlarged, measuring 7.2 cm in transverse dimension. Other: Mild diffuse soft tissue hemorrhage is seen tracking along the right thigh and right hip, without a well-defined hematoma. Musculoskeletal: There is a comminuted right femoral intertrochanteric fracture, with a displaced lesser trochanteric fragment. Mild intramuscular hemorrhage is seen along the proximal right thigh. Multilevel vacuum phenomenon is noted along the lower thoracic and lumbar spine. IMPRESSION: 1. Comminuted right femoral intertrochanteric fracture, with a displaced lesser trochanteric fragment. 2. Mild intramuscular hemorrhage along the proximal right thigh, with mild diffuse overlying soft tissue hemorrhage. No well-defined hematoma seen. 3. Interval increase in size of large infrarenal abdominal aortic aneurysm, measuring 6.4 cm in AP dimension and 6.5 cm in transverse dimension. Vascular surgery consultation recommended due to increased risk of rupture for AAA >5.5 cm. This recommendation follows ACR consensus guidelines: White Paper of the ACR Incidental Findings Committee II on Vascular Findings. J Am Coll Radiol 2013; 10:789-794. 4. Diffuse aortic atherosclerosis. Diffuse calcification along the superior mesenteric artery, common iliac arteries, external and internal iliac arteries, and common femoral arteries bilaterally. 5. Markedly enlarged prostate.  Would correlate with PSA. 6. Trace bilateral pleural effusions. Bilateral emphysema, with underlying bibasilar fibrotic change. 7. Diffuse coronary artery calcifications seen. 8. Small 3 mm nonobstructing stone at the upper pole of the right kidney. Small hyperdense right renal cyst noted. These results were called by telephone at the time of interpretation on 04/19/2016 at 6:11 am to [facility], who verbally acknowledged these results. Electronically  Signed   By: Roanna Raider M.D.   On: 04/19/2016 06:13   Dg Chest 1 View  Result Date: 04/14/2016 CLINICAL DATA:  Dizziness.  Fall. EXAM: CHEST 1 VIEW FINDINGS: Cardiac pacer with lead tips over the right atrium right ventricle. Cardiomegaly. Mild pulmonary vascular prominence with mild interstitial prominence. Mild CHF cannot be excluded. Underlying chronic interstitial lung disease most likely present. Small right pleural effusion cannot be excluded. Left costophrenic angle not imaged. No pneumothorax. No acute bony abnormalities. Degenerative changes both shoulders. IMPRESSION: 1. Cardiac pacer stable position. Cardiomegaly with mild bilateral from interstitial prominence suggesting mild CHF. Tiny right pleural effusion cannot be excluded. 2. Underlying chronic interstitial lung disease most likely present . Electronically Signed   By: Maisie Fus  Register   On: 04/14/2016 17:00   Dg Knee 1-2 Views Right  Result Date: 04/14/2016 CLINICAL DATA:  Fall. EXAM: RIGHT KNEE - 1-2 VIEW COMPARISON:  04/14/2016. FINDINGS: Soft tissue swelling. Small effusion cannot be excluded. Severe tricompartment degenerative change in osteopenia. Subtle fracture, age undetermined, of the superior aspect of patella cannot be excluded. Bipartite patella could also present in this fashion. Peripheral vascular calcification . IMPRESSION: 1. Soft tissue swelling. Small effusion cannot be excluded. Subtle fracture, age undetermined, superior aspect of the patella. Bipartite patella could also present in this fashion. 2. Diffuse osteopenia and severe degenerative change. 3. Peripheral vascular disease. Electronically Signed   By: Maisie Fus  Register   On: 04/14/2016 17:02   US Aorta  Result Date: 04/15/2016 CLINICAL DATA:  Follow-up of an abdominal aortic aneurysm measuring 5.6 x 5.7 cm seen on CT angiogram of the abdomen pelvis of October 24, 2014 EXAM: ULTRASOUND OF ABDOMINAL AORTA TECHNIQUE: Ultrasound examination of the abdominal  aorta was performed to evaluate for abdominal aortic aneurysm. COMPARISON:  CT angiogram of October 24, 2014 FINDINGS: Abdominal Aorta The proximal and mid aorta are obscured by bowel gas. There is an infrarenal abdominal aortic aneurysm measuring 6.7  cm AP x 7.2 cm transversely. There is considerable mural plaque and thrombus. The luminal diameter is approximately 3.5 cm. The aneurysm is 9.2 cm in length. The right common iliac artery measures 1.6 x 1.8 cm. The left common iliac artery measures 2.1 x 1.7 cm. Maximum Diameter: 7.2 cm in the transverse plane. IMPRESSION: Interval growth of an infrarenal abdominal aortic aneurysm which measures 6.7 cm AP x 7.2 cm transversely. It is 9.2 cm in length and extends into the left common iliac artery. The proximal and mid aorta could not be imaged due to bowel gas and the patient's clinical condition which limited changes in position. These results will be called to the ordering clinician or representative by the Radiologist Assistant, and communication documented in the PACS or zVision Dashboard. Electronically Signed   By: David  [place] M.D.   On: 04/15/2016 13:42   Dg Chest Port 1 View  Result Date: 04/17/2016 CLINICAL DATA:  Acute onset of respiratory distress. Initial encounter. EXAM: PORTABLE CHEST 1 VIEW COMPARISON:  Chest radiograph performed 04/14/2016 FINDINGS: The lungs are well-aerated. Bibasilar airspace opacities raise concern for pneumonia, though mild interstitial edema could have a similar appearance. No definite pleural effusion or pneumothorax is seen. The cardiomediastinal silhouette is mildly enlarged. A pacemaker is noted overlying the left chest wall, with leads ending overlying the right atrium and right ventricle. No acute osseous abnormalities are seen. IMPRESSION: 1. Bibasilar airspace opacities raise concern for pneumonia, though mild interstitial edema could have a similar appearance. 2. Mild cardiomegaly. Electronically Signed   By: Roanna Raider M.D.   On: 04/17/2016 23:46   Dg Knee Right Port  Result Date: 04/22/2016 CLINICAL DATA:  Right knee pain, swelling.  Recent IM nail placement EXAM: PORTABLE RIGHT KNEE - 1-2 VIEW COMPARISON:  04/14/2016 FINDINGS: The distal portion of a right femoral IM nail is visualized. No hardware complicating feature. Advanced degenerative joint disease changes with joint space narrowing and spurring, most pronounced in the patellofemoral compartment. Small joint effusion. No acute fracture, subluxation or dislocation. IMPRESSION: Postoperative changes. Advanced degenerative changes. Note findings. Electronically Signed   By: Charlett Nose M.D.   On: 04/22/2016 09:50   Dg Abd Portable 2v  Result Date: 04/17/2016 CLINICAL DATA:  Abdominal distention. EXAM: PORTABLE ABDOMEN - 2 VIEW COMPARISON:  CT abdomen and pelvis 10/24/2014. FINDINGS: No free intraperitoneal air is identified. There is mild gaseous distention of small and large bowel diffusely. Prominent stool burden ascending colon noted. IMPRESSION: Bowel gas pattern most in keeping with ileus. Large stool burden ascending colon. Electronically Signed   By: Drusilla Kanner M.D.   On: 04/17/2016 12:11   Dg C-arm 1-60 Min  Result Date: 04/19/2016 CLINICAL DATA:  Right femur fracture EXAM: DG C-ARM 61-120 MIN; RIGHT FEMUR 2 VIEWS COMPARISON:  04/14/2016 FINDINGS: Fluoroscopy shows intramedullary nail and dynamic hip screw fixation of an intertrochanteric femur fracture. Fracture alignment is significantly improved. The lesser trochanter fragment remains distracted. No evidence of intraoperative fracture. IMPRESSION: Fluoroscopy for intertrochanteric femur fracture fixation. No unexpected finding. Electronically Signed   By: Marnee Spring M.D.   On: 04/19/2016 13:38   Dg Hip Unilat W Or Wo Pelvis 2-3 Views Right  Result Date: 04/14/2016 CLINICAL DATA:  Fall today with right hip pain and deformity EXAM: DG HIP (WITH OR WITHOUT PELVIS) 2-3V RIGHT  COMPARISON:  None. FINDINGS: Bones are diffusely demineralized. Comminuted intertrochanteric right femoral neck fracture noted with varus angulation. Lesser trochanter exists as a free fragment. SI joints and  symphysis pubis unremarkable. Distal aortic and iliac artery atherosclerosis evident. IMPRESSION: 1. Comminuted intertrochanteric right femoral neck fracture with varus angulation. 2.  Abdominal Aortic Atherosclerois (ICD10-170.0) Electronically Signed   By: Kennith Center M.D.   On: 04/14/2016 17:01   Dg Femur, Min 2 Views Right  Result Date: 04/19/2016 CLINICAL DATA:  Right femur fracture EXAM: DG C-ARM 61-120 MIN; RIGHT FEMUR 2 VIEWS COMPARISON:  04/14/2016 FINDINGS: Fluoroscopy shows intramedullary nail and dynamic hip screw fixation of an intertrochanteric femur fracture. Fracture alignment is significantly improved. The lesser trochanter fragment remains distracted. No evidence of intraoperative fracture. IMPRESSION: Fluoroscopy for intertrochanteric femur fracture fixation. No unexpected finding. Electronically Signed   By: Marnee Spring M.D.   On: 04/19/2016 13:38    Lab Data:  CBC:  Recent Labs Lab 04/18/16 0029 04/19/16 0201 04/19/16 0509 04/20/16 0309 04/21/16 0519 04/22/16 0245  WBC 10.7* 7.7  --  8.5 10.0 9.7  NEUTROABS 7.4  --   --   --   --   --   HGB 9.5* 7.7* 8.8* 9.3* 8.9* 9.0*  HCT 28.5* 22.8* 26.6* 27.1* 26.3* 27.3*  MCV 100.7* 100.0  --  97.5 97.4 98.6  PLT 149* 140*  --  149* 159 176   Basic Metabolic Panel:  Recent Labs Lab 04/18/16 0029 04/19/16 0201 04/20/16 0309 04/21/16 0519 04/22/16 0245  NA 133* 133* 131* 134* 133*  K 4.4 4.6 4.8 4.8 5.2*  CL 105 108 107 108 104  CO2 19* 20* 19* 19* 20*  GLUCOSE 127* 110* 110* 101* 97  BUN 67* 69* 67* 63* 64*  CREATININE 3.03* 2.91* 2.35* 1.94* 1.71*  CALCIUM 8.1* 7.8* 8.1* 8.5* 8.6*   GFR: Estimated Creatinine Clearance: 30.6 mL/min (by C-G formula based on SCr of 1.71 mg/dL (H)). Liver Function  Tests:  Recent Labs Lab 04/18/16 0029  AST 33  ALT 11*  ALKPHOS 36*  BILITOT 0.8  PROT 5.6*  ALBUMIN 2.7*   No results for input(s): LIPASE, AMYLASE in the last 168 hours. No results for input(s): AMMONIA in the last 168 hours. Coagulation Profile:  Recent Labs Lab 04/19/16 0819  INR 1.26   Cardiac Enzymes: No results for input(s): CKTOTAL, CKMB, CKMBINDEX, TROPONINI in the last 168 hours. BNP (last 3 results) No results for input(s): PROBNP in the last 8760 hours. HbA1C: No results for input(s): HGBA1C in the last 72 hours. CBG: No results for input(s): GLUCAP in the last 168 hours. Lipid Profile: No results for input(s): CHOL, HDL, LDLCALC, TRIG, CHOLHDL, LDLDIRECT in the last 72 hours. Thyroid Function Tests: No results for input(s): TSH, T4TOTAL, FREET4, T3FREE, THYROIDAB in the last 72 hours. Anemia Panel: No results for input(s): VITAMINB12, FOLATE, FERRITIN, TIBC, IRON, RETICCTPCT in the last 72 hours. Urine analysis:    Component Value Date/Time   COLORURINE YELLOW 04/18/2016 0620   APPEARANCEUR CLEAR 04/18/2016 0620   LABSPEC 1.018 04/18/2016 0620   PHURINE 5.0 04/18/2016 0620   GLUCOSEU NEGATIVE 04/18/2016 0620   HGBUR NEGATIVE 04/18/2016 0620   BILIRUBINUR NEGATIVE 04/18/2016 0620   KETONESUR NEGATIVE 04/18/2016 0620   PROTEINUR NEGATIVE 04/18/2016 0620   NITRITE NEGATIVE 04/18/2016 0620   LEUKOCYTESUR NEGATIVE 04/18/2016 1610     Janeisha Ryle M.D. [facility] 04/22/2016, 2:01 PM  Pager: [phone] Between 7am to 7pm - call Pager - [phone]  After 7pm go to [email or web address] - password [email or web address]  Call night coverage person covering after 7pm

## 2016-04-23 ENCOUNTER — Inpatient Hospital Stay
Admission: RE | Admit: 2016-04-23 | Discharge: 2016-05-08 | Disposition: A | Discharge status: Home / Self Care | Payer: Medicare Other | Source: Ambulatory Visit | Attending: Internal Medicine | Admitting: Internal Medicine

## 2016-04-23 DIAGNOSIS — M79604 Pain in right leg: Secondary | ICD-10-CM

## 2016-04-23 DIAGNOSIS — R52 Pain, unspecified: Principal | ICD-10-CM

## 2016-04-23 DIAGNOSIS — M79605 Pain in left leg: Secondary | ICD-10-CM

## 2016-04-23 LAB — BASIC METABOLIC PANEL
ANION GAP: 7 (ref 5–15)
BUN: 57 mg/dL — ABNORMAL HIGH (ref 6–20)
CALCIUM: 8.3 mg/dL — AB (ref 8.9–10.3)
CO2: 20 mmol/L — ABNORMAL LOW (ref 22–32)
CREATININE: 1.48 mg/dL — AB (ref 0.61–1.24)
Chloride: 104 mmol/L (ref 101–111)
GFR, EST AFRICAN AMERICAN: 46 mL/min — AB (ref >60)
GFR, EST NON AFRICAN AMERICAN: 40 mL/min — AB (ref >60)
GLUCOSE: 124 mg/dL — AB (ref 65–99)
Potassium: 5.3 mmol/L — ABNORMAL HIGH (ref 3.5–5.1)
Sodium: 131 mmol/L — ABNORMAL LOW (ref 135–145)

## 2016-04-23 LAB — CBC
HCT: 24.5 % — ABNORMAL LOW (ref 39.0–52.0)
Hemoglobin: 8.4 g/dL — ABNORMAL LOW (ref 13.0–17.0)
MCH: 32.9 pg (ref 26.0–34.0)
MCHC: 34.3 g/dL (ref 30.0–36.0)
MCV: 96.1 fL (ref 78.0–100.0)
PLATELETS: 196 10*3/uL (ref 150–400)
RBC: 2.55 MIL/uL — ABNORMAL LOW (ref 4.22–5.81)
RDW: 15.2 % (ref 11.5–15.5)
WBC: 6.6 10*3/uL (ref 4.0–10.5)

## 2016-04-23 LAB — TYPE AND SCREEN
ABO/RH(D): A POS
Antibody Screen: NEGATIVE
UNIT DIVISION: 0
Unit division: 0

## 2016-04-23 LAB — BPAM RBC
BLOOD PRODUCT EXPIRATION DATE: 201803142359
Blood Product Expiration Date: 201803142359
ISSUE DATE / TIME: 201803040932
ISSUE DATE / TIME: 201803040932
UNIT TYPE AND RH: 6200
Unit Type and Rh: 6200

## 2016-04-23 LAB — SEDIMENTATION RATE: SED RATE: 63 mm/h — AB (ref 0–16)

## 2016-04-23 MED ORDER — CALCIUM GLUCONATE 10 % IV SOLN
1.0000 g | Freq: Once | INTRAVENOUS | Status: AC
  Administered 2016-04-23: 1 g via INTRAVENOUS
  Filled 2016-04-23: qty 10

## 2016-04-23 MED ORDER — CARVEDILOL 3.125 MG PO TABS: 3.1250 mg | ORAL_TABLET | Freq: Two times a day (BID) | ORAL | Status: DC

## 2016-04-23 MED ORDER — HYDROCODONE-ACETAMINOPHEN 5-325 MG PO TABS: 1.0000 | ORAL_TABLET | Freq: Four times a day (QID) | ORAL | 0 refills | Status: DC | PRN

## 2016-04-23 MED ORDER — ALLOPURINOL 100 MG PO TABS: 100.0000 mg | ORAL_TABLET | Freq: Every day | ORAL | Status: AC

## 2016-04-23 MED ORDER — ADULT MULTIVITAMIN W/MINERALS CH: 1.0000 | ORAL_TABLET | Freq: Every day | ORAL | Status: AC

## 2016-04-23 MED ORDER — ENSURE ENLIVE PO LIQD: 237.0000 mL | Freq: Two times a day (BID) | ORAL | 12 refills | Status: DC

## 2016-04-23 MED ORDER — IPRATROPIUM-ALBUTEROL 0.5-2.5 (3) MG/3ML IN SOLN: 3.0000 mL | Freq: Three times a day (TID) | RESPIRATORY_TRACT | Status: AC

## 2016-04-23 MED ORDER — BOOST / RESOURCE BREEZE PO LIQD: 1.0000 | Freq: Every morning | ORAL | 0 refills | Status: DC

## 2016-04-23 MED ORDER — SENNOSIDES-DOCUSATE SODIUM 8.6-50 MG PO TABS: 1.0000 | ORAL_TABLET | Freq: Two times a day (BID) | ORAL | Status: DC

## 2016-04-23 MED ORDER — PREDNISONE 10 MG PO TABS: ORAL_TABLET | ORAL | Status: DC

## 2016-04-23 NOTE — Progress Notes (Signed)
Patient will DC to: [facility] Anticipated DC date: 04/23/16 Family notified: Spouse and sons Transport by: Dayna Barker   Per MD patient ready for DC to [facility]. RN, patient, patient's family, and facility notified of DC. Discharge Summary sent to facility. RN given number for report. DC packet on chart. Ambulance transport requested for patient.   CSW signing off.  Cristobal Goldmann, [place] Clinical Social Worker [phone]

## 2016-04-23 NOTE — Progress Notes (Signed)
Physical Therapy Treatment Patient Details Name: Connor Diaz MRN: [id number] DOB: 1923/04/08 Today's Date: 04/23/2016    History of Present Illness Pt adm to [facility] with rt intertrochanteric hip fx on 2/27. Transferred to [facility] due to cardiac history. Worsening renal function delayed surgery and he underwent ORIF with IM rod on 3/4. PMH - AAA, ckd, nonischemic cardiomyopathy, htn, pacer, chf    PT Comments    Pt admitted with above diagnosis. Pt currently with functional limitations due to balance and endurance deficits. Pt was unable to ambulate due to right knee pain. Could stand but did not weight bear hardly at all on the right LE.  Will follow acutely. Pt will benefit from skilled PT to increase their independence and safety with mobility to allow discharge to the venue listed below.    Follow Up Recommendations  SNF     Equipment Recommendations  Other (comment) (to be assessed)    Recommendations for Other Services       Precautions / Restrictions Precautions Precautions: Fall Restrictions Weight Bearing Restrictions: Yes RLE Weight Bearing: Weight bearing as tolerated    Mobility  Bed Mobility Overal bed mobility: Needs Assistance Bed Mobility: Supine to Sit;Sit to Supine     Supine to sit: Mod assist;HOB elevated Sit to supine: +2 for physical assistance;Mod assist   General bed mobility comments: Assist to bring RLE off of bed, elevate trunk into sitting, and bring hips to EOB. Assist to lower trunk and bring legs back up into bed.  Transfers Overall transfer level: Needs assistance Equipment used: Rolling walker (2 wheeled) Transfers: Sit to/from Stand Sit to Stand: +2 physical assistance;Mod assist         General transfer comment: Assist to bring hips and trunk up. allowed pt to place hands on walker to encourage anterior weight shift. Pt able to achieve close to full standing however pt having a lot of difficulty weight bearing on right LE due  to knee pain.  Stood x 2 for about 1 min each time and could not tolerate more.   Ambulation/Gait             General Gait Details: unable   Stairs            Wheelchair Mobility    Modified Rankin (Stroke Patients Only)       Balance Overall balance assessment: Needs assistance Sitting-balance support: Feet supported;No upper extremity supported Sitting balance-Leahy Scale: Poor Sitting balance - Comments: Requires UE support to maintain sitting.   Standing balance support: Bilateral upper extremity supported Standing balance-Leahy Scale: Poor Standing balance comment: Stood x 2 with walker x 1 min each  with +2 mod A                    Cognition Arousal/Alertness: Awake/alert Behavior During Therapy: WFL for tasks assessed/performed Overall Cognitive Status: Impaired/Different from baseline Area of Impairment: Memory Orientation Level: Disoriented to;Time   Memory: Decreased short-term memory              Exercises General Exercises - Lower Extremity Ankle Circles/Pumps: AROM;Both;10 reps;Supine Long Arc Quad: AROM;Both;10 reps;Seated Heel Slides: AROM;Both;10 reps;Supine Hip ABduction/ADduction: AAROM;Both;10 reps;Supine    General Comments General comments (skin integrity, edema, etc.): right knee aspirated of fluid yesterday      Pertinent Vitals/Pain Pain Assessment: Faces Faces Pain Scale: Hurts even more Pain Location: rt hip with movement Pain Descriptors / Indicators: Grimacing;Guarding Pain Intervention(s): Limited activity within patient's tolerance;Monitored during session;Premedicated before session;Repositioned  O2 on RA 85% therefore replaced O2 at 3L and O2 stayed 90-97%.  Hr 87-104 bpm.    Home Living                      Prior Function            PT Goals (current goals can now be found in the care plan section) Progress towards PT goals: Not progressing toward goals - comment (still with right knee pain  hindering progression)    Frequency    Min 3X/week      PT Plan Current plan remains appropriate    Co-evaluation             End of Session Equipment Utilized During Treatment: Gait belt;Oxygen Activity Tolerance: Patient limited by fatigue;Patient limited by pain Patient left: in bed;with call bell/phone within reach;with family/visitor present;with bed alarm set (did not get in chair as pt was leaving for SNF after lunch) Nurse Communication: Mobility status;Need for lift equipment PT Visit Diagnosis: Difficulty in walking, not elsewhere classified (R26.2)     Time: 5462-7035 PT Time Calculation (min) (ACUTE ONLY): 23 min  Charges:  $Therapeutic Exercise: 8-22 mins $Therapeutic Activity: 8-22 mins                    G Codes:       Berline Lopes May 04, 2016, 2:33 PM  [facility] [phone] (pager)

## 2016-04-23 NOTE — Clinical Social Work Placement (Signed)
   CLINICAL SOCIAL WORK PLACEMENT  NOTE  Date:  04/23/2016  Patient Details  Name: Connor Diaz MRN: [id number] Date of Birth: [birth year]  Clinical Social Work is seeking post-discharge placement for this patient at the Skilled  Nursing Facility level of care (*CSW will initial, date and re-position this form in  chart as items are completed):  Yes   Patient/family provided with [facility] Clinical Social Work Department's list of facilities offering this level of care within the geographic area requested by the patient (or if unable, by the patient's family).  Yes   Patient/family informed of their freedom to choose among providers that offer the needed level of care, that participate in Medicare, Medicaid or managed care program needed by the patient, have an available bed and are willing to accept the patient.  Yes   Patient/family informed of [facility]'s ownership interest in [facility] and [facility], as well as of the fact that they are under no obligation to receive care at these facilities.  PASRR submitted to EDS on 04/20/16     PASRR number received on 04/20/16     Existing PASRR number confirmed on       FL2 transmitted to all facilities in geographic area requested by pt/family on 04/20/16     FL2 transmitted to all facilities within larger geographic area on       Patient informed that his/her managed care company has contracts with or will negotiate with certain facilities, including the following:        Yes   Patient/family informed of bed offers received.  Patient chooses bed at [facility]     Physician recommends and patient chooses bed at      Patient to be transferred to [facility] on 04/23/16.  Patient to be transferred to facility by [facility]     Patient family notified on 04/23/16 of transfer.  Name of family member notified:  Spouse and son     PHYSICIAN       Additional Comment:     _______________________________________________ Mearl Latin, LCSWA 04/23/2016, 12:19 PM

## 2016-04-23 NOTE — Discharge Summary (Addendum)
Physician Discharge Summary   Patient ID: Connor Diaz MRN: [id number] DOB/AGE: 1923-03-04 83 y.o.  Admit date: 04/14/2016 Discharge date: 04/23/2016  Primary Care Physician:  [name], MD  Discharge Diagnoses:    . right Hip fracture (HCC) . Aneurysm of abdominal vessel (HCC) . Paroxysmal atrial fibrillation (HCC) . Right knee effusion with pain . Acute on CKD (chronic kidney disease) stage 3, GFR 30-59 ml/min   Acute on chronic systolic CHF  Nonischemic cardio myopathy   Paroxysmal atrial fibrillation  Acute on chronic anemia  Constipation  Hyperkalemia  Moderate malnutrition  Consults:   Orthopedics, Dr. Eulah Pont Cardiology, Dr. [name] Nephrology   Recommendations for Outpatient Follow-up:  1. PT evaluation recommended skilled nursing facility for rehabilitation  Please repeat CBC/BMET at next visit Per orthopedics recommendation, weightbearing as tolerated  DIET: Heart healthy diet   Allergies:   Allergies  Allergen Reactions  . Iodinated Diagnostic Agents Rash and Swelling    Swelling in face, urticaria. Received Isovoc 370 (non-ionic) on 10/24/14.  . Isovue [Iopamidol] Itching and Swelling    Pt had slight erythema, itching and facial swelling right ear and right lower chin.  Pt sneezed several times after contrast injection.  Pt was monitored for 1 hr post injection and was given water to drink. Dr Gery Pray feels he had a very mild reaction.  Premeds are warranted in this patient.  Thanks, J Bohm     DISCHARGE MEDICATIONS: Current Discharge Medication List    START taking these medications   Details  allopurinol (ZYLOPRIM) 100 MG tablet Take 1 tablet (100 mg total) by mouth daily.    !! feeding supplement (BOOST / RESOURCE BREEZE) LIQD Take 1 Container by mouth every morning. Refills: 0    !! feeding supplement, ENSURE ENLIVE, (ENSURE ENLIVE) LIQD Take 237 mLs by mouth 2 (two) times daily between meals. Qty: 237 mL, Refills: 12     HYDROcodone-acetaminophen (NORCO/VICODIN) 5-325 MG tablet Take 1 tablet by mouth every 6 (six) hours as needed for severe pain. Qty: 15 tablet, Refills: 0    ipratropium-albuterol (DUONEB) 0.5-2.5 (3) MG/3ML SOLN Take 3 mLs by nebulization 3 (three) times daily. Qty: 360 mL    Multiple Vitamin (MULTIVITAMIN WITH MINERALS) TABS tablet Take 1 tablet by mouth daily.    predniSONE (DELTASONE) 10 MG tablet Prednisone dosing: Take  Prednisone 40mg  (4 tabs) x 1 more day, then taper to 30mg  (3 tabs) x2 days, then 20mg  (2 tabs) x 2days, then 10mg  (1 tab) x 2days, then OFF.    senna-docusate (SENOKOT-S) 8.6-50 MG tablet Take 1 tablet by mouth 2 (two) times daily.     !! - Potential duplicate medications found. Please discuss with provider.    CONTINUE these medications which have CHANGED   Details  carvedilol (COREG) 3.125 MG tablet Take 1 tablet (3.125 mg total) by mouth 2 (two) times daily with a meal.      CONTINUE these medications which have NOT CHANGED   Details  acetaminophen (TYLENOL) 500 MG tablet Take 500 mg by mouth daily.    amiodarone (PACERONE) 200 MG tablet Take 100 mg by mouth daily.    apixaban (ELIQUIS) 2.5 MG TABS tablet Take 2.5 mg by mouth 2 (two) times daily.    atorvastatin (LIPITOR) 80 MG tablet Take 40 mg by mouth daily at 6 PM.    cetirizine (ZYRTEC ALLERGY) 10 MG tablet Take 10 mg by mouth daily as needed for allergies or rhinitis.     famotidine (PEPCID) 20 MG tablet  Take 20 mg by mouth every evening.    Associated Diagnoses: Bradycardia; Acute combined systolic and diastolic heart failure (HCC); Pacemaker    finasteride (PROSCAR) 5 MG tablet Take 5 mg by mouth every evening.     fish oil-omega-3 fatty acids 1000 MG capsule Take 1 g by mouth every morning.     furosemide (LASIX) 40 MG tablet Take 1 tablet (40 mg total) by mouth daily. Qty: 30 tablet, Refills: 0    levothyroxine (SYNTHROID, LEVOTHROID) 75 MCG tablet Take 75 mcg by mouth daily before  breakfast. Refills: 5    linaclotide (LINZESS) 72 MCG capsule Take 72 mcg by mouth daily as needed (for constipation).    Menthol, Topical Analgesic, (BLUE-EMU MAXIMUM STRENGTH EX) Apply 1 application topically daily. Applied to knees    nitroGLYCERIN (NITROSTAT) 0.4 MG SL tablet Place 1 tablet (0.4 mg total) under the tongue every 5 (five) minutes x 3 doses as needed for chest pain. Qty: 25 tablet, Refills: 3    polyethylene glycol powder (GLYCOLAX/MIRALAX) powder Take 17 g by mouth daily as needed for moderate constipation.       STOP taking these medications     ramipril (ALTACE) 10 MG capsule          Brief H and P: For complete details please refer to admission H and P, but in brief Patient is a 83 year old male with medical history significant of AAA; afib, on Eliquis; BPH; CKD stage 3; HTN; hypothyroidism; bradycardia s/p pacemaker placement; and CHF (EF 20-25% in 8/17) who originally presented to [facility] with right hip fracture on 04/14/16 after a mechanical fall. Patient was seen by orthopedics, Dr. Romeo Apple however due to his significant cardiac history, cardiologist (Dr [name]) and other medical issues, Dr. Romeo Apple recommended transfer to [facility] for orthopedics surgery and medical management   Hospital Course:  Right Hip fracture [facility]) - Patient transferred from [facility] hospital due to significant cardiac history - 2-D echo 2/28 showed EF of 25-30% with diffuse hypokinesis in the basal and mid inferolateral myocardium - Postop day # 4, s/p  intramedullary nail surgery -  H&H stable, 8.4 at the time of discharge  Active Problems:   AAA/ Aneurysm of abdominal vessel (HCC) - Ultrasound abdominal done, showed Interval growth of an infrarenal abdominal aortic aneurysm which measures 6.7 cm AP x 7.2 cm transversely. - Patient was seen on 05/22/15 in [facility], seen by Dr Pattricia Boss, at that time AAA measured 6.1 x 6.2 cm, patient was recommended to  follow up. Another note on 06/24/15 stated that he was not interested in moving forward with surgery due to potential impact on his quality of life - Vasc surgery consulted, Dr. Darrick Penna discussed in detail with patient and wife in detail, recommended outpatient electively follow up with Dr. Pattricia Boss at [facility] if he recovers from his current acute medical problems, if patient has symptoms of rupture during the hospital stay, would consider open repair   Acute on chronic systolic CHF - Improving, 2-D echo on 2/28 had shown EF of 25-30% with diffuse hypokinesis. Patient is on Lasix at home, was held due to acute on chronic kidney disease. Patient had received IV fluids during hospitalization - BNP was elevated at 596, Patient received IV Lasix on 3/7, restarted oral Lasix at outpatient dose.   Right knee effusion with pain - Elevated CRP, uric acid 10.  Unable to place on NSAIDs, possibly acute gout, will place on prednisone - Right knee  portable x-ray shows small joint effusion. Arthrocentesis done with joint aspiration, feeling better. Placed on prednisone taper with allopurinol at the time of discharge.    Non-ischemic cardiomyopathy (HCC), Paroxysmal atrial fibrillation (HCC) - Rate controlled, with pacer, on amiodarone - CHADvasc score 5 - restarted eliquis     Acute on CKD (chronic kidney disease) stage 3, GFR 30-59 ml/min - Baseline creatinine ~1.7 - Per family, had not been eating well in the last few days -Nephrology was consulted, patient received IV fluids during hospitalization, renal function has been steadily improving.   Acute on Chronic anemia  - monitor closely, baseline 12 - hb dropped to 7.7 on 3/4 unclear etiology, ?hemodilution, stat CT abd did not show any hematoma.  - Currently H&H stable,  Constipation Resolved   hyperkalemia - Given calcium gluconate, started on Lasix, will hopefully improve  Moderate malnutrition Patient was placed on nutritional  supplements    Day of Discharge BP 137/70 (BP Location: Right Arm)   Pulse 92   Temp 98.2 F (36.8 C) (Oral)   Resp 17   Ht 5\' 11"  (1.803 m)   Wt 82.5 kg (181 lb 12.8 oz)   SpO2 97%   BMI 25.36 kg/m   Physical Exam: General: Alert and awake oriented x3 not in any acute distress. HEENT: anicteric sclera, pupils reactive to light and accommodation CVS: S1-S2 clear no murmur rubs or gallops Chest: Decreased breath sounds at the bases Abdomen: soft nontender, nondistended, normal bowel sounds Extremities: no cyanosis, clubbing or edema noted bilaterally. Right knee is improving Neuro: Cranial nerves II-XII intact, no focal neurological deficits   The results of significant diagnostics from this hospitalization (including imaging, microbiology, ancillary and laboratory) are listed below for reference.    LAB RESULTS: Basic Metabolic Panel:  Recent Labs Lab 04/22/16 0245 04/23/16 0252  NA 133* 131*  K 5.2* 5.3*  CL 104 104  CO2 20* 20*  GLUCOSE 97 124*  BUN 64* 57*  CREATININE 1.71* 1.48*  CALCIUM 8.6* 8.3*   Liver Function Tests:  Recent Labs Lab 04/18/16 0029  AST 33  ALT 11*  ALKPHOS 36*  BILITOT 0.8  PROT 5.6*  ALBUMIN 2.7*   No results for input(s): LIPASE, AMYLASE in the last 168 hours. No results for input(s): AMMONIA in the last 168 hours. CBC:  Recent Labs Lab 04/18/16 0029  04/22/16 0245 04/23/16 0252  WBC 10.7*  < > 9.7 6.6  NEUTROABS 7.4  --   --   --   HGB 9.5*  < > 9.0* 8.4*  HCT 28.5*  < > 27.3* 24.5*  MCV 100.7*  < > 98.6 96.1  PLT 149*  < > 176 196  < > = values in this interval not displayed. Cardiac Enzymes: No results for input(s): CKTOTAL, CKMB, CKMBINDEX, TROPONINI in the last 168 hours. BNP: Invalid input(s): POCBNP CBG: No results for input(s): GLUCAP in the last 168 hours.  Significant Diagnostic Studies:  Dg Chest 1 View  Result Date: 04/14/2016 CLINICAL DATA:  Dizziness.  Fall. EXAM: CHEST 1 VIEW FINDINGS:  Cardiac pacer with lead tips over the right atrium right ventricle. Cardiomegaly. Mild pulmonary vascular prominence with mild interstitial prominence. Mild CHF cannot be excluded. Underlying chronic interstitial lung disease most likely present. Small right pleural effusion cannot be excluded. Left costophrenic angle not imaged. No pneumothorax. No acute bony abnormalities. Degenerative changes both shoulders. IMPRESSION: 1. Cardiac pacer stable position. Cardiomegaly with mild bilateral from interstitial prominence suggesting mild CHF. Tiny right pleural  effusion cannot be excluded. 2. Underlying chronic interstitial lung disease most likely present . Electronically Signed   By: Maisie Fus  Register   On: 04/14/2016 17:00   Dg Knee 1-2 Views Right  Result Date: 04/14/2016 CLINICAL DATA:  Fall. EXAM: RIGHT KNEE - 1-2 VIEW COMPARISON:  04/14/2016. FINDINGS: Soft tissue swelling. Small effusion cannot be excluded. Severe tricompartment degenerative change in osteopenia. Subtle fracture, age undetermined, of the superior aspect of patella cannot be excluded. Bipartite patella could also present in this fashion. Peripheral vascular calcification . IMPRESSION: 1. Soft tissue swelling. Small effusion cannot be excluded. Subtle fracture, age undetermined, superior aspect of the patella. Bipartite patella could also present in this fashion. 2. Diffuse osteopenia and severe degenerative change. 3. Peripheral vascular disease. Electronically Signed   By: Maisie Fus  Register   On: 04/14/2016 17:02   US Aorta  Result Date: 04/15/2016 CLINICAL DATA:  Follow-up of an abdominal aortic aneurysm measuring 5.6 x 5.7 cm seen on CT angiogram of the abdomen pelvis of October 24, 2014 EXAM: ULTRASOUND OF ABDOMINAL AORTA TECHNIQUE: Ultrasound examination of the abdominal aorta was performed to evaluate for abdominal aortic aneurysm. COMPARISON:  CT angiogram of October 24, 2014 FINDINGS: Abdominal Aorta The proximal and mid aorta are  obscured by bowel gas. There is an infrarenal abdominal aortic aneurysm measuring 6.7 cm AP x 7.2 cm transversely. There is considerable mural plaque and thrombus. The luminal diameter is approximately 3.5 cm. The aneurysm is 9.2 cm in length. The right common iliac artery measures 1.6 x 1.8 cm. The left common iliac artery measures 2.1 x 1.7 cm. Maximum Diameter: 7.2 cm in the transverse plane. IMPRESSION: Interval growth of an infrarenal abdominal aortic aneurysm which measures 6.7 cm AP x 7.2 cm transversely. It is 9.2 cm in length and extends into the left common iliac artery. The proximal and mid aorta could not be imaged due to bowel gas and the patient's clinical condition which limited changes in position. These results will be called to the ordering clinician or representative by the Radiologist Assistant, and communication documented in the PACS or zVision Dashboard. Electronically Signed   By: David  [place] M.D.   On: 04/15/2016 13:42   Dg Hip Unilat W Or Wo Pelvis 2-3 Views Right  Result Date: 04/14/2016 CLINICAL DATA:  Fall today with right hip pain and deformity EXAM: DG HIP (WITH OR WITHOUT PELVIS) 2-3V RIGHT COMPARISON:  None. FINDINGS: Bones are diffusely demineralized. Comminuted intertrochanteric right femoral neck fracture noted with varus angulation. Lesser trochanter exists as a free fragment. SI joints and symphysis pubis unremarkable. Distal aortic and iliac artery atherosclerosis evident. IMPRESSION: 1. Comminuted intertrochanteric right femoral neck fracture with varus angulation. 2.  Abdominal Aortic Atherosclerois (ICD10-170.0) Electronically Signed   By: Kennith Center M.D.   On: 04/14/2016 17:01    2D ECHO: Study Conclusions  - Left ventricle: The cavity size was at the upper limits of   normal. Wall thickness was increased in a pattern of mild LVH.   Systolic function was severely reduced. The estimated ejection   fraction was in the range of 25% to 30%. Prominent apical    trabeculation. Diffuse hypokinesis. There is severe hypokinesis   of the basal-midinferolateral myocardium. The study is not   technically sufficient to allow evaluation of LV diastolic   function. - Ventricular septum: Septal motion showed abnormal function and   dyssynergy. - Aortic valve: Moderately calcified annulus. Trileaflet;   moderately calcified leaflets. There was mild regurgitation. - Aortic root:  The aortic root was mildly ectatic. - Mitral valve: Calcified annulus. There was mild regurgitation. - Right ventricle: Pacer wire or catheter noted in right ventricle.   Systolic function was mildly reduced. - Right atrium: Central venous pressure (est): 3 mm Hg. - Tricuspid valve: There was trivial regurgitation. - Pulmonary arteries: PA peak pressure: 39 mm Hg (S). - Pericardium, extracardiac: There was no pericardial effusion.   Disposition and Follow-up: Discharge Instructions    (HEART FAILURE PATIENTS) Call MD:  Anytime you have any of the following symptoms: 1) 3 pound weight gain in 24 hours or 5 pounds in 1 week 2) shortness of breath, with or without a dry hacking cough 3) swelling in the hands, feet or stomach 4) if you have to sleep on extra pillows at night in order to breathe.    Complete by:  As directed    Diet - low sodium heart healthy    Complete by:  As directed    Increase activity slowly    Complete by:  As directed        DISPOSITION: SNF    DISCHARGE FOLLOW-UP  Contact information for follow-up providers    DONDIEGO,RICHARD M, MD. Schedule an appointment as soon as possible for a visit in 2 week(s).   Specialty:  Internal Medicine Contact information: [address] [place] [address] [phone]        Sheral Apley, MD. Schedule an appointment as soon as possible for a visit in 2 week(s).   Specialty:  Orthopedic Surgery Contact information: [address] [place] [address] [phone]         Rinaldo Cloud, MD. Schedule an appointment as soon as possible for a visit in 2 week(s).   Specialty:  Cardiology Contact information: [address] [place] [address] [phone]            Contact information for after-discharge care    Destination    [facility] .   Specialty:  Skilled Nursing Facility Contact information: [address] [place] [address] [phone]                   Time spent on Discharge: 35 MINS   Signed:   Kinsey Karch M.D. [facility] 04/23/2016, 12:05 PM Pager: [phone]

## 2016-04-24 ENCOUNTER — Non-Acute Institutional Stay (SKILLED_NURSING_FACILITY): Payer: Medicare Other | Admitting: Internal Medicine

## 2016-04-24 ENCOUNTER — Other Ambulatory Visit: Payer: Self-pay

## 2016-04-24 ENCOUNTER — Encounter (HOSPITAL_COMMUNITY)
Admission: RE | Admit: 2016-04-24 | Discharge: 2016-04-24 | Disposition: A | Discharge status: Home / Self Care | Payer: Medicare Other | Source: Skilled Nursing Facility | Attending: Internal Medicine | Admitting: Internal Medicine

## 2016-04-24 ENCOUNTER — Encounter: Payer: Self-pay | Admitting: Internal Medicine

## 2016-04-24 DIAGNOSIS — S72144D Nondisplaced intertrochanteric fracture of right femur, subsequent encounter for closed fracture with routine healing: Secondary | ICD-10-CM | POA: Diagnosis not present

## 2016-04-24 DIAGNOSIS — I428 Other cardiomyopathies: Secondary | ICD-10-CM | POA: Diagnosis not present

## 2016-04-24 DIAGNOSIS — M25461 Effusion, right knee: Secondary | ICD-10-CM

## 2016-04-24 DIAGNOSIS — N183 Chronic kidney disease, stage 3 unspecified: Secondary | ICD-10-CM

## 2016-04-24 DIAGNOSIS — I5041 Acute combined systolic (congestive) and diastolic (congestive) heart failure: Secondary | ICD-10-CM

## 2016-04-24 LAB — DIFFERENTIAL
BASOS ABS: 0 10*3/uL (ref 0.0–0.1)
Basophils Relative: 0 %
Eosinophils Absolute: 0 10*3/uL (ref 0.0–0.7)
Eosinophils Relative: 0 %
LYMPHS ABS: 1.2 10*3/uL (ref 0.7–4.0)
LYMPHS PCT: 10 %
Monocytes Absolute: 1.4 10*3/uL — ABNORMAL HIGH (ref 0.1–1.0)
Monocytes Relative: 11 %
NEUTROS ABS: 10.1 10*3/uL — AB (ref 1.7–7.7)
NEUTROS PCT: 79 %

## 2016-04-24 LAB — BASIC METABOLIC PANEL
ANION GAP: 8 (ref 5–15)
BUN: 60 mg/dL — ABNORMAL HIGH (ref 6–20)
CALCIUM: 8.7 mg/dL — AB (ref 8.9–10.3)
CO2: 22 mmol/L (ref 22–32)
Chloride: 104 mmol/L (ref 101–111)
Creatinine, Ser: 1.38 mg/dL — ABNORMAL HIGH (ref 0.61–1.24)
GFR calc Af Amer: 50 mL/min — ABNORMAL LOW (ref >60)
GFR calc non Af Amer: 43 mL/min — ABNORMAL LOW (ref >60)
GLUCOSE: 115 mg/dL — AB (ref 65–99)
Potassium: 4.5 mmol/L (ref 3.5–5.1)
Sodium: 134 mmol/L — ABNORMAL LOW (ref 135–145)

## 2016-04-24 LAB — CBC
HEMATOCRIT: 26.3 % — AB (ref 39.0–52.0)
Hemoglobin: 9.4 g/dL — ABNORMAL LOW (ref 13.0–17.0)
MCH: 34.7 pg — AB (ref 26.0–34.0)
MCHC: 35.7 g/dL (ref 30.0–36.0)
MCV: 97 fL (ref 78.0–100.0)
Platelets: 275 10*3/uL (ref 150–400)
RBC: 2.71 MIL/uL — ABNORMAL LOW (ref 4.22–5.81)
RDW: 15.5 % (ref 11.5–15.5)
WBC: 12.9 10*3/uL — ABNORMAL HIGH (ref 4.0–10.5)

## 2016-04-24 MED ORDER — HYDROCODONE-ACETAMINOPHEN 5-325 MG PO TABS: 1.0000 | ORAL_TABLET | Freq: Four times a day (QID) | ORAL | 0 refills | Status: DC | PRN

## 2016-04-24 NOTE — Telephone Encounter (Signed)
Rx faxed to [facility] at [phone].   [address] [place], [place] [address]  Phone #: [phone] 

## 2016-04-24 NOTE — Progress Notes (Signed)
Location:   [facility] Room Number: 128/P Place of Service:  SNF (31) Provider:  Malachi Bonds, MD  Patient Care Team: Oval Linsey, MD as PCP - General (Internal Medicine) Hillis Range, MD as Consulting Physician (Cardiology) Rinaldo Cloud, MD as Consulting Physician (Cardiology)  Extended Emergency Contact Information Primary Emergency Contact: Trower,Nola Address: [address]          [place], [place] [address] [place] Home Phone: [phone] Mobile Phone: [phone] Relation: Spouse  Code Status:  DNR Goals of care: Advanced Directive information Advanced Directives 04/24/2016  Does Patient Have a Medical Advance Directive? Yes  Type of Advance Directive Out of facility DNR (pink MOST or yellow form)  Does patient want to make changes to medical advance directive? No - Patient declined  Copy of Healthcare Power of Attorney in Chart? -  Would patient like information on creating a medical advance directive? No - Patient declined  Pre-existing out of facility DNR order (yellow form or pink MOST form) -     Chief Complaint  Patient presents with  . Acute Visit  Status post hospitalization for right hip fracture with repair  HPI:  Pt is a 83 y.o. male seen today for an acute visit for follow-up of hospitalization for right hip fracture sustained after a fall that was surgically repaired.  [facility] was [facility] secondary to high risk surgery with a history of congestive heart failure ejection fraction 25-30%.  He apparently tolerated the procedure well and is here for rehabilitation.  In regards to CHF he did receive some IV Lasix in hospital has been discharged on by mouth Lasix.  This was held at one point during hospitalization because of chronic kidney disease.  But renal function apparently did improve during hospitalization his creatinine of 1.38 today  appears to be improved BUN is still elevated at around 60.  He also was treated for right knee effusion with pain-he did have an arthrocentesis done and apparently helped ease been started on a prednisone taper as well as care of her and I'll he still has some pain but prednisone was recently started hopefully we'll be some results here.  He does receive Vicodin as needed for pain as well.  He also has a history of acute on chronic anemia hemoglobin was down to 7.7 on March 4 with unclear etiology this has rebounded somewhat as well as 9.4 today which appears to be gradually rising.  He also has a history and abdominal aortic aneurysm Dopplers measured at  6.77.2 cm apparently this is growing-there was recommendation for reconsultation at [facility] where he is followed for any possible interventions although it was thought that interventions will be limited because of his comorbidities and advanced age.  . Currently he is resting but complains says he has more right leg and knee pain with movement at rest appears to be better-he does not complain of any fever or chills diarrhea shortness of breath or increased cough he is on oxygen currently.  Vital signs appear to be stable        Past Medical History:  Diagnosis Date  . AAA (abdominal aortic aneurysm) (HCC)    a. 02/2009 U/S 4.4 x 4.5 cm  . BPH (benign prostatic hyperplasia)   . CKD (chronic kidney disease), stage III   . Coronary artery disease   . DJD (degenerative joint disease)   . History of tobacco  abuse    a. roughly 36 pack years, quit @ age 46.  Marland Kitchen Hypertension   . Hypothyroidism   . Non-ischemic cardiomyopathy (HCC)   . PAF (paroxysmal atrial fibrillation) (HCC)    a. noted 11/2011  . Pancreatitis    a. 10/2011 - managed conservatively @ home.  . Symptomatic bradycardia    a. s/p STJ dual chamber PPM   . Systolic heart failure (HCC)    02/2013   Past Surgical History:  Procedure Laterality Date  . CATARACT  EXTRACTION W/ INTRAOCULAR LENS  IMPLANT, BILATERAL Bilateral 1980's  . CORONARY ANGIOGRAM  11/19/2011   Procedure: CORONARY ANGIOGRAM;  Surgeon: [name], MD;  Location: [facility] CATH LAB;  Service: Cardiovascular;;  . CORONARY ANGIOPLASTY WITH STENT PLACEMENT  12/22/2011    LAD & CIRCUMFLEX  . EYE SURGERY    . INTRAMEDULLARY (IM) NAIL INTERTROCHANTERIC Right 04/19/2016   Procedure: INTRAMEDULLARY (IM) NAIL INTERTROCHANTRIC;  Surgeon: Sheral Apley, MD;  Location: [facility] OR;  Service: Orthopedics;  Laterality: Right;  . PERCUTANEOUS CORONARY STENT INTERVENTION (PCI-S) N/A 12/22/2011   Procedure: PERCUTANEOUS CORONARY STENT INTERVENTION (PCI-S);  Surgeon: [name], MD;  Location[facility] C[facility] LAB;  Service: Cardiovascular;  Laterality: N/A;  . PERMANENT PACEMAKER INSERTION N/A 11/20/2011   STJ dual chamber PPM implanted by [name] for symptomatic bradycardia   . TONSILLECTOMY  ~ 1933    Allergies  Allergen Reactions  . Iodinated Diagnostic Agents Rash and Swelling    Swelling in face, urticaria. Received Isovoc 370 (non-ionic) on 10/24/14.  . Isovue [Iopamidol] Itching and Swelling    Pt had slight erythema, itching and facial swelling right ear and right lower chin.  Pt sneezed several times after contrast injection.  Pt was monitored for 1 hr post injection and was given water to drink. Dr Gery Pray feels he had a very mild reaction.  Premeds are warranted in this patient.  Thanks, Gildardo Griffes    Current Outpatient Prescriptions on File Prior to Visit  Medication Sig Dispense Refill  . acetaminophen (TYLENOL) 500 MG tablet Take 500 mg by mouth daily.    Marland Kitchen allopurinol (ZYLOPRIM) 100 MG tablet Take 1 tablet (100 mg total) by mouth daily.    Marland Kitchen amiodarone (PACERONE) 200 MG tablet Take 100 mg by mouth daily.    Marland Kitchen apixaban (ELIQUIS) 2.5 MG TABS tablet Take 2.5 mg by mouth 2 (two) times daily.    Marland Kitchen atorvastatin (LIPITOR) 80 MG tablet Take 40 mg by mouth daily at 6 PM.    . carvedilol (COREG) 3.125 MG  tablet Take 1 tablet (3.125 mg total) by mouth 2 (two) times daily with a meal.    . cetirizine (ZYRTEC ALLERGY) 10 MG tablet Take 10 mg by mouth daily as needed for allergies or rhinitis.     . famotidine (PEPCID) 20 MG tablet Take 20 mg by mouth every evening.     . feeding supplement, ENSURE ENLIVE, (ENSURE ENLIVE) LIQD Take 237 mLs by mouth 2 (two) times daily between meals. 237 mL 12  . finasteride (PROSCAR) 5 MG tablet Take 5 mg by mouth every evening.     . fish oil-omega-3 fatty acids 1000 MG capsule Take 1 g by mouth every morning.     . furosemide (LASIX) 40 MG tablet Take 1 tablet (40 mg total) by mouth daily. 30 tablet 0  . HYDROcodone-acetaminophen (NORCO/VICODIN) 5-325 MG tablet Take 1 tablet by mouth every 6 (six) hours as needed for severe pain. 120 tablet 0  .  ipratropium-albuterol (DUONEB) 0.5-2.5 (3) MG/3ML SOLN Take 3 mLs by nebulization 3 (three) times daily. 360 mL   . levothyroxine (SYNTHROID, LEVOTHROID) 75 MCG tablet Take 75 mcg by mouth daily before breakfast.  5  . linaclotide (LINZESS) 72 MCG capsule Take 72 mcg by mouth daily as needed (for constipation).    . Menthol, Topical Analgesic, (BLUE-EMU MAXIMUM STRENGTH EX) Apply 1 application topically daily. Applied to knees    . Multiple Vitamin (MULTIVITAMIN WITH MINERALS) TABS tablet Take 1 tablet by mouth daily.    . nitroGLYCERIN (NITROSTAT) 0.4 MG SL tablet Place 1 tablet (0.4 mg total) under the tongue every 5 (five) minutes x 3 doses as needed for chest pain. 25 tablet 3  . polyethylene glycol powder (GLYCOLAX/MIRALAX) powder Take 17 g by mouth daily as needed for moderate constipation.     . predniSONE (DELTASONE) 10 MG tablet Prednisone dosing: Take  Prednisone 40mg  (4 tabs) x 1 more day, then taper to 30mg  (3 tabs) x2 days, then 20mg  (2 tabs) x 2days, then 10mg  (1 tab) x 2days, then OFF.    Marland Kitchen senna-docusate (SENOKOT-S) 8.6-50 MG tablet Take 1 tablet by mouth 2 (two) times daily.     No current  facility-administered medications on file prior to visit.      Review of Systems   In general is not complaining of a fever or chills says his pain is more so associated with movement.  Skin does not complain of rashes or itching he does have bruising left hip site Steri-Strips are in place and don't see any sign of infection.  Head ears eyes nose mouth and throat does not complain of any sore throat or visual changes.  Respiratory denies any cough or shortness of breath.  Cardiac is not complaining of any chest pain has some mild edema of the surgical site postop otherwise I don't really appreciate significant edema.  GI is not complaining of any nausea vomiting diarrhea constipation or abdominal discomfort.  GU is not complaining of any overt dysuria.  Skeletal skeletal says he does have some right leg knee pain more with movement.  Neurologic is not complaining dizziness headache or numbness.  Psych does not complain of anxiety or depression appears to be in very good spirits.  Physical exam.  Temperatures 97.3 pulse 75 respirations 20 blood pressure 125/66 O2 sats ration is 94% on room air.  General this is a very pleasant LE male who looks somewhat younger than his stated age.  His skin is warm and dry does have violaceous bruising around the right hip surgical site as well as Steri-Strips in place with no sign of infection.  Eyes visual acuity appears grossly intact sclera and conjunctiva are clear.  Oropharynx is clear mucous membranes moist.  Chest is clear to auscultation with somewhat shallow air entry there is no labored breathing.    Heart is regular rate and rhythm without murmur gallop or rub he has edema of the righft surgical site  lower extremity edema below this appears fairly unremarkable pedal pulses are intact bilaterally.  Abdomen is soft nontender with positive bowel sounds.  GU could not really appreciate any suprapubic  tenderness.  Musculoskeletal does have limited mobility of right leg as noted above with some violaceous bruising of the hip. Steri-Strips are in place I don't see any sign of cellulitis drainage bleeding or significant infection.  Does have some edema and TTP of right knee  Neurologic is intact no lateralizing findings speech is clear cranial  nerves are intact.  Psych he is alert and oriented very pleasant and appropriate quite conversant  Immunization History  Administered Date(s) Administered  . Influenza-Unspecified 11/21/2011, 10/17/2012  . Pneumococcal-Unspecified 10/17/2012   Pertinent  Health Maintenance Due  Topic Date Due  . INFLUENZA VACCINE  01/16/2017 (Originally 09/17/2015)  . PNA vac Low Risk Adult (2 of 2 - PCV13) 01/16/2017 (Originally 10/17/2013)   No flowsheet data found. Functional Status Survey:    Vitals:   04/24/16 1522  BP: (!) 157/84  Pulse: 84  Resp: 20  Temp: 97.6 F (36.4 C)  TempSrc: Oral  Of note update her vital signs temperature 97.3 pulse 75 respirations 20 blood pressure 125/66 O2 saturation 94% on oxygen  Physical Exam   Please see above   Labs reviewed:  Recent Labs  04/22/16 0245 04/23/16 0252 04/24/16 0615  NA 133* 131* 134*  K 5.2* 5.3* 4.5  CL 104 104 104  CO2 20* 20* 22  GLUCOSE 97 124* 115*  BUN 64* 57* 60*  CREATININE 1.71* 1.48* 1.38*  CALCIUM 8.6* 8.3* 8.7*    Recent Labs  04/14/16 1609 04/18/16 0029  AST 18 33  ALT 11* 11*  ALKPHOS 44 36*  BILITOT 0.6 0.8  PROT 6.8 5.6*  ALBUMIN 3.6 2.7*    Recent Labs  04/14/16 1609  04/18/16 0029  04/22/16 0245 04/23/16 0252 04/24/16 0615  WBC 8.3  < > 10.7*  < > 9.7 6.6 12.9*  NEUTROABS 4.5  --  7.4  --   --   --  10.1*  HGB 12.5*  < > 9.5*  < > 9.0* 8.4* 9.4*  HCT 36.5*  < > 28.5*  < > 27.3* 24.5* 26.3*  MCV 99.7  < > 100.7*  < > 98.6 96.1 97.0  PLT 152  < > 149*  < > 176 196 275  < > = values in this interval not displayed. Lab Results  Component Value  Date   TSH 8.507 (H) 02/23/2013   No results found for: HGBA1C Lab Results  Component Value Date   CHOL 124 11/19/2011   HDL 46 11/19/2011   LDLCALC 61 11/19/2011   TRIG 87 11/19/2011   CHOLHDL 2.7 11/19/2011    Significant Diagnostic Results in last 30 days:  Ct Abdomen Pelvis Wo Contrast  Result Date: 04/19/2016 CLINICAL DATA:  Status post acute right hip fracture. Patient on anticoagulation for atrial fibrillation. Acute on chronic anemia. Assess for right hip hematoma or retroperitoneal hemorrhage. Initial encounter. EXAM: CT ABDOMEN AND PELVIS WITHOUT CONTRAST TECHNIQUE: Multidetector CT imaging of the abdomen and pelvis was performed following the standard protocol without IV contrast. COMPARISON:  CT of the abdomen and pelvis from 10/24/2014 FINDINGS: Lower chest: Trace bilateral pleural effusions are noted. Bilateral emphysema is seen, with underlying bibasilar fibrotic change. Diffuse coronary artery calcifications are seen. A pacemaker lead is partially imaged. Hepatobiliary: The liver is unremarkable in appearance. The gallbladder is unremarkable in appearance. The common bile duct remains normal in caliber. Pancreas: The pancreas is diffusely atrophic and grossly unremarkable. Spleen: The spleen is unremarkable in appearance. Adrenals/Urinary Tract: The adrenal glands are unremarkable in appearance. Nonspecific perinephric stranding is noted bilaterally. A small hyperdense cyst is noted at the anterior aspect of the right kidney. A small 3 mm calcification is noted at the upper pole of the right kidney. There is no evidence of hydronephrosis. No obstructing ureteral stones are seen. Stomach/Bowel: The stomach is unremarkable in appearance. The small bowel is within normal  limits. The appendix is not visualized; there is no evidence for appendicitis. The colon is unremarkable in appearance. Vascular/Lymphatic: There is aneurysmal dilatation of the infrarenal abdominal aorta to 6.4 cm in AP  dimension and 6.5 cm in transverse dimension. Diffuse calcification is seen along the abdominal aorta and its branches, including along the superior mesenteric artery. Aneurysmal dilatation resolves at the aortic bifurcation. There is diffuse calcification along the common iliac arteries, external and internal iliac arteries, and common femoral arteries bilaterally. No retroperitoneal or pelvic sidewall lymphadenopathy is seen. Reproductive: The bladder is mildly distended and grossly unremarkable. The prostate is enlarged, measuring 7.2 cm in transverse dimension. Other: Mild diffuse soft tissue hemorrhage is seen tracking along the right thigh and right hip, without a well-defined hematoma. Musculoskeletal: There is a comminuted right femoral intertrochanteric fracture, with a displaced lesser trochanteric fragment. Mild intramuscular hemorrhage is seen along the proximal right thigh. Multilevel vacuum phenomenon is noted along the lower thoracic and lumbar spine. IMPRESSION: 1. Comminuted right femoral intertrochanteric fracture, with a displaced lesser trochanteric fragment. 2. Mild intramuscular hemorrhage along the proximal right thigh, with mild diffuse overlying soft tissue hemorrhage. No well-defined hematoma seen. 3. Interval increase in size of large infrarenal abdominal aortic aneurysm, measuring 6.4 cm in AP dimension and 6.5 cm in transverse dimension. Vascular surgery consultation recommended due to increased risk of rupture for AAA >5.5 cm. This recommendation follows ACR consensus guidelines: White Paper of the ACR Incidental Findings Committee II on Vascular Findings. J Am Coll Radiol 2013; 10:789-794. 4. Diffuse aortic atherosclerosis. Diffuse calcification along the superior mesenteric artery, common iliac arteries, external and internal iliac arteries, and common femoral arteries bilaterally. 5. Markedly enlarged prostate.  Would correlate with PSA. 6. Trace bilateral pleural effusions.  Bilateral emphysema, with underlying bibasilar fibrotic change. 7. Diffuse coronary artery calcifications seen. 8. Small 3 mm nonobstructing stone at the upper pole of the right kidney. Small hyperdense right renal cyst noted. These results were called by telephone at the time of interpretation on 04/19/2016 at 6:11 am to [facility] on [facility], who verbally acknowledged these results. Electronically Signed   By: Roanna Raider M.D.   On: 04/19/2016 06:13   Dg Chest 1 View  Result Date: 04/14/2016 CLINICAL DATA:  Dizziness.  Fall. EXAM: CHEST 1 VIEW FINDINGS: Cardiac pacer with lead tips over the right atrium right ventricle. Cardiomegaly. Mild pulmonary vascular prominence with mild interstitial prominence. Mild CHF cannot be excluded. Underlying chronic interstitial lung disease most likely present. Small right pleural effusion cannot be excluded. Left costophrenic angle not imaged. No pneumothorax. No acute bony abnormalities. Degenerative changes both shoulders. IMPRESSION: 1. Cardiac pacer stable position. Cardiomegaly with mild bilateral from interstitial prominence suggesting mild CHF. Tiny right pleural effusion cannot be excluded. 2. Underlying chronic interstitial lung disease most likely present . Electronically Signed   By: Maisie Fus  Register   On: 04/14/2016 17:00   Dg Knee 1-2 Views Right  Result Date: 04/14/2016 CLINICAL DATA:  Fall. EXAM: RIGHT KNEE - 1-2 VIEW COMPARISON:  04/14/2016. FINDINGS: Soft tissue swelling. Small effusion cannot be excluded. Severe tricompartment degenerative change in osteopenia. Subtle fracture, age undetermined, of the superior aspect of patella cannot be excluded. Bipartite patella could also present in this fashion. Peripheral vascular calcification . IMPRESSION: 1. Soft tissue swelling. Small effusion cannot be excluded. Subtle fracture, age undetermined, superior aspect of the patella. Bipartite patella could also present in this fashion. 2. Diffuse osteopenia and  severe degenerative change. 3. Peripheral vascular disease. Electronically  Signed   By: Maisie Fus  Register   On: 04/14/2016 17:02   US Aorta  Result Date: 04/15/2016 CLINICAL DATA:  Follow-up of an abdominal aortic aneurysm measuring 5.6 x 5.7 cm seen on CT angiogram of the abdomen pelvis of October 24, 2014 EXAM: ULTRASOUND OF ABDOMINAL AORTA TECHNIQUE: Ultrasound examination of the abdominal aorta was performed to evaluate for abdominal aortic aneurysm. COMPARISON:  CT angiogram of October 24, 2014 FINDINGS: Abdominal Aorta The proximal and mid aorta are obscured by bowel gas. There is an infrarenal abdominal aortic aneurysm measuring 6.7 cm AP x 7.2 cm transversely. There is considerable mural plaque and thrombus. The luminal diameter is approximately 3.5 cm. The aneurysm is 9.2 cm in length. The right common iliac artery measures 1.6 x 1.8 cm. The left common iliac artery measures 2.1 x 1.7 cm. Maximum Diameter: 7.2 cm in the transverse plane. IMPRESSION: Interval growth of an infrarenal abdominal aortic aneurysm which measures 6.7 cm AP x 7.2 cm transversely. It is 9.2 cm in length and extends into the left common iliac artery. The proximal and mid aorta could not be imaged due to bowel gas and the patient's clinical condition which limited changes in position. These results will be called to the ordering clinician or representative by the Radiologist Assistant, and communication documented in the PACS or zVision Dashboard. Electronically Signed   By: David  [place] M.D.   On: 04/15/2016 13:42   Dg Chest Port 1 View  Result Date: 04/17/2016 CLINICAL DATA:  Acute onset of respiratory distress. Initial encounter. EXAM: PORTABLE CHEST 1 VIEW COMPARISON:  Chest radiograph performed 04/14/2016 FINDINGS: The lungs are well-aerated. Bibasilar airspace opacities raise concern for pneumonia, though mild interstitial edema could have a similar appearance. No definite pleural effusion or pneumothorax is seen. The  cardiomediastinal silhouette is mildly enlarged. A pacemaker is noted overlying the left chest wall, with leads ending overlying the right atrium and right ventricle. No acute osseous abnormalities are seen. IMPRESSION: 1. Bibasilar airspace opacities raise concern for pneumonia, though mild interstitial edema could have a similar appearance. 2. Mild cardiomegaly. Electronically Signed   By: Roanna Raider M.D.   On: 04/17/2016 23:46   Dg Knee Right Port  Result Date: 04/22/2016 CLINICAL DATA:  Right knee pain, swelling.  Recent IM nail placement EXAM: PORTABLE RIGHT KNEE - 1-2 VIEW COMPARISON:  04/14/2016 FINDINGS: The distal portion of a right femoral IM nail is visualized. No hardware complicating feature. Advanced degenerative joint disease changes with joint space narrowing and spurring, most pronounced in the patellofemoral compartment. Small joint effusion. No acute fracture, subluxation or dislocation. IMPRESSION: Postoperative changes. Advanced degenerative changes. Note findings. Electronically Signed   By: Charlett Nose M.D.   On: 04/22/2016 09:50   Dg Abd Portable 2v  Result Date: 04/17/2016 CLINICAL DATA:  Abdominal distention. EXAM: PORTABLE ABDOMEN - 2 VIEW COMPARISON:  CT abdomen and pelvis 10/24/2014. FINDINGS: No free intraperitoneal air is identified. There is mild gaseous distention of small and large bowel diffusely. Prominent stool burden ascending colon noted. IMPRESSION: Bowel gas pattern most in keeping with ileus. Large stool burden ascending colon. Electronically Signed   By: Drusilla Kanner M.D.   On: 04/17/2016 12:11   Dg C-arm 1-60 Min  Result Date: 04/19/2016 CLINICAL DATA:  Right femur fracture EXAM: DG C-ARM 61-120 MIN; RIGHT FEMUR 2 VIEWS COMPARISON:  04/14/2016 FINDINGS: Fluoroscopy shows intramedullary nail and dynamic hip screw fixation of an intertrochanteric femur fracture. Fracture alignment is significantly improved. The lesser trochanter fragment  remains distracted.  No evidence of intraoperative fracture. IMPRESSION: Fluoroscopy for intertrochanteric femur fracture fixation. No unexpected finding. Electronically Signed   By: Marnee Spring M.D.   On: 04/19/2016 13:38   Dg Hip Unilat W Or Wo Pelvis 2-3 Views Right  Result Date: 04/14/2016 CLINICAL DATA:  Fall today with right hip pain and deformity EXAM: DG HIP (WITH OR WITHOUT PELVIS) 2-3V RIGHT COMPARISON:  None. FINDINGS: Bones are diffusely demineralized. Comminuted intertrochanteric right femoral neck fracture noted with varus angulation. Lesser trochanter exists as a free fragment. SI joints and symphysis pubis unremarkable. Distal aortic and iliac artery atherosclerosis evident. IMPRESSION: 1. Comminuted intertrochanteric right femoral neck fracture with varus angulation. 2.  Abdominal Aortic Atherosclerois (ICD10-170.0) Electronically Signed   By: Kennith Center M.D.   On: 04/14/2016 17:01   Dg Femur, Min 2 Views Right  Result Date: 04/19/2016 CLINICAL DATA:  Right femur fracture EXAM: DG C-ARM 61-120 MIN; RIGHT FEMUR 2 VIEWS COMPARISON:  04/14/2016 FINDINGS: Fluoroscopy shows intramedullary nail and dynamic hip screw fixation of an intertrochanteric femur fracture. Fracture alignment is significantly improved. The lesser trochanter fragment remains distracted. No evidence of intraoperative fracture. IMPRESSION: Fluoroscopy for intertrochanteric femur fracture fixation. No unexpected finding. Electronically Signed   By: Marnee Spring M.D.   On: 04/19/2016 13:38    Assessment/Plan   1 history right hip fracture that was surgically repaired after tolerated the procedure well he will need continued PT and OT.  He did have apparently postop anemia on discharge from hospital was 8.4 and actually is 9.4 today this appears to be trending in the right direction at this point will monitor.  #2 history of right knee effusion with pain again he is now on a prednisone taper as well as L Pernell will monitor-white  count is somewhat elevated at 12.9 today I do not really see any signs of infection this will have to be monitored but I suspect prednisone may be playing a role with the elevated white count.  #3 history of acute on chronic systolic CHF-she continues on Lasix he is also on a beta blocker ejection fraction most recently 25-30% this was done in 04/15/2016.  We'll need weights Monday Wednesday Friday notify provider of gain greater than 3 pounds  #4 history of abdominal aortic aneurysm again this appears to be growing was seen by vascular surgery in hospital recommendation I'll patient follow-up electively at [facility] if he recovers from current medical problems.  #5 history of acute on chronic renal insufficiency his creatinine today actually appears improved at 1.38 BUN is still elevated at 60 this appears to be improving however this will have to be watched with his history of Lasix will update a metabolic panel next week.  #6 history of acute on chronic anemia again hemoglobin appears to be rising will update this next week as well in global was 9.4 today which appears to be improved from 8.4 on hospital discharge.  #7 history of nonischemic cardiomyopathy as well as atrial fibrillation-this is rate controlled he is status post pacer also on amiodarone continues on Eliquis for anticoagulation.  #8 suspect history of BPH he is on Proscar.  #9 history of hypothyroidism continues on Synthroid-will update a TSH next week as well.  #10 history of allergic rhinitis continues on Zyrtec.  #11 history of constipation he is on once S as needed daily.  [id number] note greater than 45 minutes spent assessing patient-reviewing his chart-reviewing his labs-and coordinating and formulating plan of care for numerous  diagnoses-of note greater than 50% of time spent coordinating plan of care

## 2016-04-25 ENCOUNTER — Non-Acute Institutional Stay (SKILLED_NURSING_FACILITY): Payer: Medicare Other | Admitting: Internal Medicine

## 2016-04-25 DIAGNOSIS — I480 Paroxysmal atrial fibrillation: Secondary | ICD-10-CM | POA: Diagnosis not present

## 2016-04-25 DIAGNOSIS — I5041 Acute combined systolic (congestive) and diastolic (congestive) heart failure: Secondary | ICD-10-CM | POA: Diagnosis not present

## 2016-04-25 DIAGNOSIS — S72144D Nondisplaced intertrochanteric fracture of right femur, subsequent encounter for closed fracture with routine healing: Secondary | ICD-10-CM | POA: Diagnosis not present

## 2016-04-25 DIAGNOSIS — I428 Other cardiomyopathies: Secondary | ICD-10-CM | POA: Diagnosis not present

## 2016-04-25 NOTE — Progress Notes (Signed)
04/25/16  Facility; [facility]  Chief complaint; admission to SNF post stay at [facility] 04/14/16 through 04/23/16  History; this is a patient who suffered a fall at home. He was admitted to hospital with a right hip fracture. Because of significant cardiac issues including an EF of 25-30% [listed as nonischemic] he was transferred to [facility]. He had an intramedullary nail surgery. He appears to have tolerated this well.  His major postoperative problem according the patient was right knee pain. Accordingly was wife he has had right knee pain for quite a number of years felt to be secondary to degenerative joint disease. He was noted to have an effusion and an arthrocentesis was done. X-ray was negative. This was negative for crystals. CNS was negative. No significant leukocytosis. He was found to have a uric acid level of 10 and an elevated C-reactive protein. He was placed on prednisone and allopurinol. He states his knee is feeling better  With regards to his cardiac status this is a nonischemic cardiomyopathy. He also has paroxysmal atrial fibrillation. He was restarted on Eliquis postoperatively. He has stage III chronic renal failure and had acute on chronic renal failure resulting infolding of his Lasix however post hydration his Lasix had to be restarted. He had postoperative drop in his hemoglobin is 7.7 of unclear etiology. Finally he has a very large abdominal aortic aneurysm for which she is seeing a Dr. Pattricia Boss at [facility]. Also seen by Dr. Darrick Penna during the hospitalization. His AAA apparently measures 6.1 x 6.2. A CT scan of the abdomen hospital done because of the anemia and a history of AAA did not show any hematoma.  BMP Latest Ref Rng & Units 04/24/2016 04/23/2016 04/22/2016  Glucose 65 - 99 mg/dL 098(J) 191(Y) 97  BUN 6 - 20 mg/dL 78(G) 95(A) 21(H)  Creatinine 0.61 - 1.24 mg/dL 0.86(V) 7.84(O) 9.62(X)  Sodium 135 - 145 mmol/L 134(L) 131(L) 133(L)  Potassium 3.5 - 5.1 mmol/L 4.5 5.3(H)  5.2(H)  Chloride 101 - 111 mmol/L 104 104 104  CO2 22 - 32 mmol/L 22 20(L) 20(L)  Calcium 8.9 - 10.3 mg/dL 5.2(W) 8.3(L) 8.6(L)    CBC Latest Ref Rng & Units 04/24/2016 04/23/2016 04/22/2016  WBC 4.0 - 10.5 K/uL 12.9(H) 6.6 9.7  Hemoglobin 13.0 - 17.0 g/dL 4.1(L) 2.4(M) 0.1(U)  Hematocrit 39.0 - 52.0 % 26.3(L) 24.5(L) 27.3(L)  Platelets 150 - 400 K/uL 275 196 176    Past Medical History:  Diagnosis Date  . AAA (abdominal aortic aneurysm) (HCC)    a. 02/2009 U/S 4.4 x 4.5 cm  . BPH (benign prostatic hyperplasia)   . CKD (chronic kidney disease), stage III   . Coronary artery disease   . DJD (degenerative joint disease)   . History of tobacco abuse    a. roughly 36 pack years, quit @ age 59.  Marland Kitchen Hypertension   . Hypothyroidism   . Non-ischemic cardiomyopathy (HCC)   . PAF (paroxysmal atrial fibrillation) (HCC)    a. noted 11/2011  . Pancreatitis    a. 10/2011 - managed conservatively @ home.  . Symptomatic bradycardia    a. s/p STJ dual chamber PPM   . Systolic heart failure (HCC)    02/2013    Past Surgical History:  Procedure Laterality Date  . CATARACT EXTRACTION W/ INTRAOCULAR LENS  IMPLANT, BILATERAL Bilateral 1980's  . CORONARY ANGIOGRAM  11/19/2011   Procedure: CORONARY ANGIOGRAM;  Surgeon: [name], MD;  Location: [facility] CATH LAB;  Service:  Cardiovascular;;  . CORONARY ANGIOPLASTY WITH STENT PLACEMENT  12/22/2011    LAD & CIRCUMFLEX  . EYE SURGERY    . INTRAMEDULLARY (IM) NAIL INTERTROCHANTERIC Right 04/19/2016   Procedure: INTRAMEDULLARY (IM) NAIL INTERTROCHANTRIC;  Surgeon: Sheral Apley, MD;  Location: [facility] OR;  Service: Orthopedics;  Laterality: Right;  . PERCUTANEOUS CORONARY STENT INTERVENTION (PCI-S) N/A 12/22/2011   Procedure: PERCUTANEOUS CORONARY STENT INTERVENTION (PCI-S);  Surgeon: [name], MD;  Location: [facility] CATH LAB;  Service: Cardiovascular;  Laterality: N/A;  . PERMANENT PACEMAKER INSERTION N/A 11/20/2011   STJ dual chamber PPM implanted by [name]  for symptomatic bradycardia   . TONSILLECTOMY  ~ 1933    . Current Outpatient Prescriptions on File Prior to Visit  Medication Sig Dispense Refill  . acetaminophen (TYLENOL) 500 MG tablet Take 500 mg by mouth daily.    Marland Kitchen allopurinol (ZYLOPRIM) 100 MG tablet Take 1 tablet (100 mg total) by mouth daily.    Marland Kitchen amiodarone (PACERONE) 200 MG tablet Take 100 mg by mouth daily.    Marland Kitchen apixaban (ELIQUIS) 2.5 MG TABS tablet Take 2.5 mg by mouth 2 (two) times daily.    Marland Kitchen atorvastatin (LIPITOR) 80 MG tablet Take 40 mg by mouth daily at 6 PM.    . carvedilol (COREG) 3.125 MG tablet Take 1 tablet (3.125 mg total) by mouth 2 (two) times daily with a meal.    . cetirizine (ZYRTEC ALLERGY) 10 MG tablet Take 10 mg by mouth daily as needed for allergies or rhinitis.     . famotidine (PEPCID) 20 MG tablet Take 20 mg by mouth every evening.     . feeding supplement, ENSURE ENLIVE, (ENSURE ENLIVE) LIQD Take 237 mLs by mouth 2 (two) times daily between meals. 237 mL 12  . finasteride (PROSCAR) 5 MG tablet Take 5 mg by mouth every evening.     . fish oil-omega-3 fatty acids 1000 MG capsule Take 1 g by mouth every morning.     . furosemide (LASIX) 40 MG tablet Take 1 tablet (40 mg total) by mouth daily. 30 tablet 0  . HYDROcodone-acetaminophen (NORCO/VICODIN) 5-325 MG tablet Take 1 tablet by mouth every 6 (six) hours as needed for severe pain. 120 tablet 0  . ipratropium-albuterol (DUONEB) 0.5-2.5 (3) MG/3ML SOLN Take 3 mLs by nebulization 3 (three) times daily. 360 mL   . levothyroxine (SYNTHROID, LEVOTHROID) 75 MCG tablet Take 75 mcg by mouth daily before breakfast.  5  . linaclotide (LINZESS) 72 MCG capsule Take 72 mcg by mouth daily as needed (for constipation).    . Menthol, Topical Analgesic, (BLUE-EMU MAXIMUM STRENGTH EX) Apply 1 application topically daily. Applied to knees    . Multiple Vitamin (MULTIVITAMIN WITH MINERALS) TABS tablet Take 1 tablet by mouth daily.    . nitroGLYCERIN (NITROSTAT) 0.4 MG SL  tablet Place 1 tablet (0.4 mg total) under the tongue every 5 (five) minutes x 3 doses as needed for chest pain. 25 tablet 3  . polyethylene glycol powder (GLYCOLAX/MIRALAX) powder Take 17 g by mouth daily as needed for moderate constipation.     . predniSONE (DELTASONE) 10 MG tablet Prednisone dosing: Take  Prednisone 40mg  (4 tabs) x 1 more day, then taper to 30mg  (3 tabs) x2 days, then 20mg  (2 tabs) x 2days, then 10mg  (1 tab) x 2days, then OFF.    Marland Kitchen senna-docusate (SENOKOT-S) 8.6-50 MG tablet Take 1 tablet by mouth 2 (two) times daily.       Social; patient lives in his  own home and [place] with his wife. Uses a walker does not have a fall history. Independent with ADLs and IADLs with his wife still driving. He is not on oxygen at home  reports that he quit smoking about 35 years ago. His smoking use included Cigarettes. He has a 35.00 pack-year smoking history. He has never used smokeless tobacco. He reports that he does not drink alcohol or use drugs.  family history includes Cancer in his mother; Other in his father.  Review of systems Gen. his no history of oxygen use feels well. HEENT no swallowing difficulties Respiratory not on oxygen at home. No current shortness of breath Cardiac no clear chest pain Abdomen no abdominal pain GU no dysuria Musculoskeletal she does not have a history of gout or other acute arthritis.  Physical exam Gen. patient is not in any distress. Oxygen on at 2 L Vitals; pulse is 60 and irregular O2 sat 95% on 2 L. Respirations 18 and unlabored HEENT oral exam is normal Respiratory bronchial breathing at the right greater than left lobe with crackles on the left Cardiac heart sounds are normal there is no S3 JVP is not elevated no murmurs Abdomen no liver no spleen no tenderness no masses Musculoskeletal; swelling of the right knee noted perhaps a small residual effusion no major warmth or tenderness. Extremities no evidence of a  DVT  Impression/plan #1 status post right hip fracture on Eliquis chronically for A. fib #2 right knee effusion in the hospital. X-rays showed degenerative changes. Tap revealed no infection and no crystals. I'm not sure about the allopurinol in this setting even though his uric acid level was 10. This may be asymptomatic hyperuricemia. He is on a prednisone taper and the knee seems to be stable. #3 nonischemic cardiomyopathy. He is on a beta blocker at [facility] his pulse rate is in the low 60s I'm not sure he would tolerate much more. He is not on an ACE inhibitor. #4 chronic renal failure. His baseline creatinine is listed as 1.7 although last creatinine I see in the hospital was 1.38. I think this was done here. His chronic renal failure is listed as stage III. He had some acute deterioration in the hospital that responded to fluids. I suspect he is fluid overloaded now []  of the left effusion] #5 anemia his hemoglobin dropped to 7.7 in the hospital. Stat abdominal CT did not show any hematoma his hemoglobin is up to 9.4 #6 he was treated for hyperkalemia in the hospital. Recent potassium stable at 4.5  I'm then to gently increase his Lasix monitor his BUN and creatinine. I suspect these and a mild degree of pulmonary edema

## 2016-04-26 LAB — BODY FLUID CULTURE: CULTURE: NO GROWTH

## 2016-04-27 ENCOUNTER — Encounter (HOSPITAL_COMMUNITY)
Admission: RE | Admit: 2016-04-27 | Discharge: 2016-04-27 | Disposition: A | Discharge status: Home / Self Care | Payer: Medicare Other | Source: Skilled Nursing Facility | Attending: Internal Medicine | Admitting: Internal Medicine

## 2016-04-27 ENCOUNTER — Encounter: Payer: Self-pay | Admitting: Internal Medicine

## 2016-04-27 ENCOUNTER — Non-Acute Institutional Stay (SKILLED_NURSING_FACILITY): Payer: Medicare Other | Admitting: Internal Medicine

## 2016-04-27 DIAGNOSIS — N183 Chronic kidney disease, stage 3 unspecified: Secondary | ICD-10-CM

## 2016-04-27 DIAGNOSIS — M25461 Effusion, right knee: Secondary | ICD-10-CM | POA: Diagnosis not present

## 2016-04-27 DIAGNOSIS — D72829 Elevated white blood cell count, unspecified: Secondary | ICD-10-CM

## 2016-04-27 LAB — CBC WITH DIFFERENTIAL/PLATELET
BASOS ABS: 0 10*3/uL (ref 0.0–0.1)
BASOS PCT: 0 %
EOS PCT: 0 %
Eosinophils Absolute: 0 10*3/uL (ref 0.0–0.7)
HEMATOCRIT: 31.1 % — AB (ref 39.0–52.0)
Hemoglobin: 10.7 g/dL — ABNORMAL LOW (ref 13.0–17.0)
Lymphocytes Relative: 14 %
Lymphs Abs: 2.2 10*3/uL (ref 0.7–4.0)
MCH: 34.1 pg — ABNORMAL HIGH (ref 26.0–34.0)
MCHC: 34.4 g/dL (ref 30.0–36.0)
MCV: 99 fL (ref 78.0–100.0)
MONO ABS: 1.6 10*3/uL — AB (ref 0.1–1.0)
MONOS PCT: 11 %
NEUTROS ABS: 11.5 10*3/uL — AB (ref 1.7–7.7)
Neutrophils Relative %: 75 %
PLATELETS: 358 10*3/uL (ref 150–400)
RBC: 3.14 MIL/uL — ABNORMAL LOW (ref 4.22–5.81)
RDW: 16 % — AB (ref 11.5–15.5)
WBC: 15.3 10*3/uL — ABNORMAL HIGH (ref 4.0–10.5)

## 2016-04-27 LAB — BASIC METABOLIC PANEL
Anion gap: 8 (ref 5–15)
BUN: 62 mg/dL — AB (ref 6–20)
CALCIUM: 8.9 mg/dL (ref 8.9–10.3)
CHLORIDE: 99 mmol/L — AB (ref 101–111)
CO2: 27 mmol/L (ref 22–32)
CREATININE: 1.42 mg/dL — AB (ref 0.61–1.24)
GFR calc non Af Amer: 42 mL/min — ABNORMAL LOW (ref >60)
GFR, EST AFRICAN AMERICAN: 49 mL/min — AB (ref >60)
Glucose, Bld: 91 mg/dL (ref 65–99)
Potassium: 4.4 mmol/L (ref 3.5–5.1)
SODIUM: 134 mmol/L — AB (ref 135–145)

## 2016-04-27 LAB — ANAEROBIC CULTURE

## 2016-04-27 LAB — TSH: TSH: 7.076 u[IU]/mL — ABNORMAL HIGH (ref 0.350–4.500)

## 2016-04-27 NOTE — Progress Notes (Signed)
Location:   [facility] Room Number: 128/P Place of Service:  SNF (31) Provider:  Malachi Bonds, MD  Patient Care Team: Oval Linsey, MD as PCP - General (Internal Medicine) Hillis Range, MD as Consulting Physician (Cardiology) Rinaldo Cloud, MD as Consulting Physician (Cardiology)  Extended Emergency Contact Information Primary Emergency Contact: Engelson,Nola Address: [address]          [place], [place] [address] [place] Home Phone: [phone] Mobile Phone: [phone] Relation: Spouse  Code Status:  DNR Goals of care: Advanced Directive information Advanced Directives 04/27/2016  Does Patient Have a Medical Advance Directive? Yes  Type of Advance Directive Out of facility DNR (pink MOST or yellow form)  Does patient want to make changes to medical advance directive? No - Patient declined  Copy of Healthcare Power of Attorney in Chart? -  Would patient like information on creating a medical advance directive? No - Patient declined  Pre-existing out of facility DNR order (yellow form or pink MOST form) -     Chief Complaint  Patient presents with  . Acute Visit   Secondary to elevated white count.   HPI:  Pt is a 83 y.o. male seen today for an acute visit for follow-up of elevated white count of 15,300.  Patient is here for rehabilitation after sustaining a right hip fracture that was surgically repaired.  He was also treated for right knee effusion he did have an arthrocentesis done is also on a prednisone taper that appears to be helping somewhat with the pain.  Routine lab today shows that his white count is elevated at 15,300.  He is denying any fever or chills diarrhea any increased cough or congestion syndrome point he had dysuria in the hospital but he says this is better.  He still complains of some right knee discomfort was says it has gotten somewhat better.       Past Medical  History:  Diagnosis Date  . AAA (abdominal aortic aneurysm) (HCC)    a. 02/2009 U/S 4.4 x 4.5 cm  . BPH (benign prostatic hyperplasia)   . CKD (chronic kidney disease), stage III   . Coronary artery disease   . DJD (degenerative joint disease)   . History of tobacco abuse    a. roughly 36 pack years, quit @ age 9.  Marland Kitchen Hypertension   . Hypothyroidism   . Non-ischemic cardiomyopathy (HCC)   . PAF (paroxysmal atrial fibrillation) (HCC)    a. noted 11/2011  . Pancreatitis    a. 10/2011 - managed conservatively @ home.  . Symptomatic bradycardia    a. s/p STJ dual chamber PPM   . Systolic heart failure (HCC)    02/2013   Past Surgical History:  Procedure Laterality Date  . CATARACT EXTRACTION W/ INTRAOCULAR LENS  IMPLANT, BILATERAL Bilateral 1980's  . CORONARY ANGIOGRAM  11/19/2011   Procedure: CORONARY ANGIOGRAM;  Surgeon: [name], MD;  Location: [facility] CATH LAB;  Service: Cardiovascular;;  . CORONARY ANGIOPLASTY WITH STENT PLACEMENT  12/22/2011    LAD & CIRCUMFLEX  . EYE SURGERY    . INTRAMEDULLARY (IM) NAIL INTERTROCHANTERIC Right 04/19/2016   Procedure: INTRAMEDULLARY (IM) NAIL INTERTROCHANTRIC;  Surgeon: Sheral Apley, MD;  Location: [facility] OR;  Service: Orthopedics;  Laterality: Right;  . PERCUTANEOUS CORONARY STENT INTERVENTION (PCI-S) N/A 12/22/2011   Procedure: PERCUTANEOUS CORONARY STENT INTERVENTION (PCI-S);  Surgeon: [name], MD;  Location: [facility] CATH LAB;  Service: Cardiovascular;  Laterality: N/A;  . PERMANENT PACEMAKER INSERTION N/A 11/20/2011   STJ dual chamber PPM implanted by [name] for symptomatic bradycardia   . TONSILLECTOMY  ~ 1933    Allergies  Allergen Reactions  . Iodinated Diagnostic Agents Rash and Swelling    Swelling in face, urticaria. Received Isovoc 370 (non-ionic) on 10/24/14.  . Isovue [Iopamidol] Itching and Swelling    Pt had slight erythema, itching and facial swelling right ear and right lower chin.  Pt sneezed several times after  contrast injection.  Pt was monitored for 1 hr post injection and was given water to drink. Dr Gery Pray feels he had a very mild reaction.  Premeds are warranted in this patient.  Thanks, Gildardo Griffes    Current Outpatient Prescriptions on File Prior to Visit  Medication Sig Dispense Refill  . acetaminophen (TYLENOL) 500 MG tablet Take 500 mg by mouth daily.    Marland Kitchen allopurinol (ZYLOPRIM) 100 MG tablet Take 1 tablet (100 mg total) by mouth daily.    Marland Kitchen amiodarone (PACERONE) 200 MG tablet Take 100 mg by mouth daily.    Marland Kitchen apixaban (ELIQUIS) 2.5 MG TABS tablet Take 2.5 mg by mouth 2 (two) times daily.    Marland Kitchen atorvastatin (LIPITOR) 80 MG tablet Take 40 mg by mouth daily at 6 PM.    . carvedilol (COREG) 3.125 MG tablet Take 1 tablet (3.125 mg total) by mouth 2 (two) times daily with a meal.    . cetirizine (ZYRTEC ALLERGY) 10 MG tablet Take 10 mg by mouth daily as needed for allergies or rhinitis.     . famotidine (PEPCID) 20 MG tablet Take 20 mg by mouth every evening.     . feeding supplement, ENSURE ENLIVE, (ENSURE ENLIVE) LIQD Take 237 mLs by mouth 2 (two) times daily between meals. 237 mL 12  . finasteride (PROSCAR) 5 MG tablet Take 5 mg by mouth every evening.     . fish oil-omega-3 fatty acids 1000 MG capsule Take 1 g by mouth every morning.     Marland Kitchen HYDROcodone-acetaminophen (NORCO/VICODIN) 5-325 MG tablet Take 1 tablet by mouth every 6 (six) hours as needed for severe pain. 120 tablet 0  . ipratropium-albuterol (DUONEB) 0.5-2.5 (3) MG/3ML SOLN Take 3 mLs by nebulization 3 (three) times daily. 360 mL   . levothyroxine (SYNTHROID, LEVOTHROID) 75 MCG tablet Take 75 mcg by mouth daily before breakfast.  5  . linaclotide (LINZESS) 72 MCG capsule Take 72 mcg by mouth daily as needed (for constipation).    . Menthol, Topical Analgesic, (BLUE-EMU MAXIMUM STRENGTH EX) Apply 1 application topically daily. Applied to knees    . Multiple Vitamin (MULTIVITAMIN WITH MINERALS) TABS tablet Take 1 tablet by mouth daily.      . nitroGLYCERIN (NITROSTAT) 0.4 MG SL tablet Place 1 tablet (0.4 mg total) under the tongue every 5 (five) minutes x 3 doses as needed for chest pain. 25 tablet 3  . polyethylene glycol powder (GLYCOLAX/MIRALAX) powder Take 17 g by mouth daily as needed for moderate constipation.     . predniSONE (DELTASONE) 10 MG tablet Prednisone dosing: Take  Prednisone 40mg  (4 tabs) x 1 more day, then taper to 30mg  (3 tabs) x2 days, then 20mg  (2 tabs) x 2days, then 10mg  (1 tab) x 2days, then OFF.    Marland Kitchen senna-docusate (SENOKOT-S) 8.6-50 MG tablet Take 1 tablet by mouth 2 (two) times daily.     No current facility-administered medications on file prior to visit.     Review of Systems  In general is not complaining of a fever or chills   Skin does not complain of rashes or itching he does have bruising left hip site Steri-Strips are in place and don't see any sign of infection.  Head ears eyes nose mouth and throat does not complain of any sore throat or visual changes.  Respiratory denies any cough or shortness of breath.  Cardiac is not complaining of any chest pain has some mild edema of the surgical site postop otherwise I don't really appreciate significant edema.  GI is not complaining of any nausea vomiting diarrhea constipation or abdominal discomfort.  GU is not complaining of any overt dysuria.  Muscl skeletal says he does have some right leg knee pain more with movement. Apparently this has improved somewhat  Neurologic is not complaining dizziness headache or numbness.  Psych does not complain of anxiety or depression continues to be in good spirits.   Immunization History  Administered Date(s) Administered  . Influenza-Unspecified 11/21/2011, 10/17/2012  . Pneumococcal-Unspecified 10/17/2012   Pertinent  Health Maintenance Due  Topic Date Due  . INFLUENZA VACCINE  01/16/2017 (Originally 09/17/2015)  . PNA vac Low Risk Adult (2 of 2 - PCV13) 01/16/2017 (Originally  10/17/2013)   No flowsheet data found. Functional Status Survey:    Temperature is 98.8 pulse 92 respirations 20 blood pressure 129/89 weight is 172.8  Physical Exam   General this is a very pleasant elderly  male who looks somewhat younger than his stated age.  His skin is warm and dry does have violaceous bruising around the right hip surgical site as well as Steri-Strips in place with no sign of infection.  Eyes visual acuity appears grossly intact sclera and conjunctiva are clear.  Oropharynx is clear mucous membranes moist.  Chest is largely clear to auscultation there is some very minimal spray wheezing on the right this is quite minute however there is no labored breathing.    Heart is regular rate and rhythm without murmur gallop or rub he has edema of the righft surgical site  lower extremity edema below this appears fairly unremarkable pedal pulses are intact bilaterally.  Continues to have some edema of his right knee I do not really see any significant erythema or increased warmth from baseline there is some tenderness to palpation  Abdomen is soft nontender with positive bowel sounds.  GU could not really appreciate any suprapubic tenderness.  Musculoskeletal does have limited mobility of right leg as noted above with some violaceous bruising of the hip. Steri-Strips are in place I don't see any sign of cellulitis drainage bleeding or significant infection.   Neurologic is intact no lateralizing findings speech is clear cranial nerves are intact.  Psych he is alert and oriented very pleasant and appropriate quite conversant   Labs reviewed:  Recent Labs  04/23/16 0252 04/24/16 0615 04/27/16 0700  NA 131* 134* 134*  K 5.3* 4.5 4.4  CL 104 104 99*  CO2 20* 22 27  GLUCOSE 124* 115* 91  BUN 57* 60* 62*  CREATININE 1.48* 1.38* 1.42*  CALCIUM 8.3* 8.7* 8.9    Recent Labs  04/14/16 1609 04/18/16 0029  AST 18 33  ALT 11* 11*  ALKPHOS 44 36*   BILITOT 0.6 0.8  PROT 6.8 5.6*  ALBUMIN 3.6 2.7*    Recent Labs  04/18/16 0029  04/23/16 0252 04/24/16 0615 04/27/16 0700  WBC 10.7*  < > 6.6 12.9* 15.3*  NEUTROABS 7.4  --   --  10.1* 11.5*  HGB 9.5*  < > 8.4* 9.4* 10.7*  HCT 28.5*  < > 24.5* 26.3* 31.1*  MCV 100.7*  < > 96.1 97.0 99.0  PLT 149*  < > 196 275 358  < > = values in this interval not displayed. Lab Results  Component Value Date   TSH 7.076 (H) 04/27/2016   No results found for: HGBA1C Lab Results  Component Value Date   CHOL 124 11/19/2011   HDL 46 11/19/2011   LDLCALC 61 11/19/2011   TRIG 87 11/19/2011   CHOLHDL 2.7 11/19/2011    Significant Diagnostic Results in last 30 days:  Ct Abdomen Pelvis Wo Contrast  Result Date: 04/19/2016 CLINICAL DATA:  Status post acute right hip fracture. Patient on anticoagulation for atrial fibrillation. Acute on chronic anemia. Assess for right hip hematoma or retroperitoneal hemorrhage. Initial encounter. EXAM: CT ABDOMEN AND PELVIS WITHOUT CONTRAST TECHNIQUE: Multidetector CT imaging of the abdomen and pelvis was performed following the standard protocol without IV contrast. COMPARISON:  CT of the abdomen and pelvis from 10/24/2014 FINDINGS: Lower chest: Trace bilateral pleural effusions are noted. Bilateral emphysema is seen, with underlying bibasilar fibrotic change. Diffuse coronary artery calcifications are seen. A pacemaker lead is partially imaged. Hepatobiliary: The liver is unremarkable in appearance. The gallbladder is unremarkable in appearance. The common bile duct remains normal in caliber. Pancreas: The pancreas is diffusely atrophic and grossly unremarkable. Spleen: The spleen is unremarkable in appearance. Adrenals/Urinary Tract: The adrenal glands are unremarkable in appearance. Nonspecific perinephric stranding is noted bilaterally. A small hyperdense cyst is noted at the anterior aspect of the right kidney. A small 3 mm calcification is noted at the upper pole of  the right kidney. There is no evidence of hydronephrosis. No obstructing ureteral stones are seen. Stomach/Bowel: The stomach is unremarkable in appearance. The small bowel is within normal limits. The appendix is not visualized; there is no evidence for appendicitis. The colon is unremarkable in appearance. Vascular/Lymphatic: There is aneurysmal dilatation of the infrarenal abdominal aorta to 6.4 cm in AP dimension and 6.5 cm in transverse dimension. Diffuse calcification is seen along the abdominal aorta and its branches, including along the superior mesenteric artery. Aneurysmal dilatation resolves at the aortic bifurcation. There is diffuse calcification along the common iliac arteries, external and internal iliac arteries, and common femoral arteries bilaterally. No retroperitoneal or pelvic sidewall lymphadenopathy is seen. Reproductive: The bladder is mildly distended and grossly unremarkable. The prostate is enlarged, measuring 7.2 cm in transverse dimension. Other: Mild diffuse soft tissue hemorrhage is seen tracking along the right thigh and right hip, without a well-defined hematoma. Musculoskeletal: There is a comminuted right femoral intertrochanteric fracture, with a displaced lesser trochanteric fragment. Mild intramuscular hemorrhage is seen along the proximal right thigh. Multilevel vacuum phenomenon is noted along the lower thoracic and lumbar spine. IMPRESSION: 1. Comminuted right femoral intertrochanteric fracture, with a displaced lesser trochanteric fragment. 2. Mild intramuscular hemorrhage along the proximal right thigh, with mild diffuse overlying soft tissue hemorrhage. No well-defined hematoma seen. 3. Interval increase in size of large infrarenal abdominal aortic aneurysm, measuring 6.4 cm in AP dimension and 6.5 cm in transverse dimension. Vascular surgery consultation recommended due to increased risk of rupture for AAA >5.5 cm. This recommendation follows ACR consensus guidelines:  White Paper of the ACR Incidental Findings Committee II on Vascular Findings. J Am Coll Radiol 2013; 10:789-794. 4. Diffuse aortic atherosclerosis. Diffuse calcification along the superior mesenteric artery, common iliac arteries, external and internal iliac arteries, and common femoral  arteries bilaterally. 5. Markedly enlarged prostate.  Would correlate with PSA. 6. Trace bilateral pleural effusions. Bilateral emphysema, with underlying bibasilar fibrotic change. 7. Diffuse coronary artery calcifications seen. 8. Small 3 mm nonobstructing stone at the upper pole of the right kidney. Small hyperdense right renal cyst noted. These results were called by telephone at the time of interpretation on 04/19/2016 at 6:11 am to [facility] on [facility], who verbally acknowledged these results. Electronically Signed   By: Roanna Raider M.D.   On: 04/19/2016 06:13   Dg Chest 1 View  Result Date: 04/14/2016 CLINICAL DATA:  Dizziness.  Fall. EXAM: CHEST 1 VIEW FINDINGS: Cardiac pacer with lead tips over the right atrium right ventricle. Cardiomegaly. Mild pulmonary vascular prominence with mild interstitial prominence. Mild CHF cannot be excluded. Underlying chronic interstitial lung disease most likely present. Small right pleural effusion cannot be excluded. Left costophrenic angle not imaged. No pneumothorax. No acute bony abnormalities. Degenerative changes both shoulders. IMPRESSION: 1. Cardiac pacer stable position. Cardiomegaly with mild bilateral from interstitial prominence suggesting mild CHF. Tiny right pleural effusion cannot be excluded. 2. Underlying chronic interstitial lung disease most likely present . Electronically Signed   By: Maisie Fus  Register   On: 04/14/2016 17:00   Dg Knee 1-2 Views Right  Result Date: 04/14/2016 CLINICAL DATA:  Fall. EXAM: RIGHT KNEE - 1-2 VIEW COMPARISON:  04/14/2016. FINDINGS: Soft tissue swelling. Small effusion cannot be excluded. Severe tricompartment degenerative change in  osteopenia. Subtle fracture, age undetermined, of the superior aspect of patella cannot be excluded. Bipartite patella could also present in this fashion. Peripheral vascular calcification . IMPRESSION: 1. Soft tissue swelling. Small effusion cannot be excluded. Subtle fracture, age undetermined, superior aspect of the patella. Bipartite patella could also present in this fashion. 2. Diffuse osteopenia and severe degenerative change. 3. Peripheral vascular disease. Electronically Signed   By: Maisie Fus  Register   On: 04/14/2016 17:02   US Aorta  Result Date: 04/15/2016 CLINICAL DATA:  Follow-up of an abdominal aortic aneurysm measuring 5.6 x 5.7 cm seen on CT angiogram of the abdomen pelvis of October 24, 2014 EXAM: ULTRASOUND OF ABDOMINAL AORTA TECHNIQUE: Ultrasound examination of the abdominal aorta was performed to evaluate for abdominal aortic aneurysm. COMPARISON:  CT angiogram of October 24, 2014 FINDINGS: Abdominal Aorta The proximal and mid aorta are obscured by bowel gas. There is an infrarenal abdominal aortic aneurysm measuring 6.7 cm AP x 7.2 cm transversely. There is considerable mural plaque and thrombus. The luminal diameter is approximately 3.5 cm. The aneurysm is 9.2 cm in length. The right common iliac artery measures 1.6 x 1.8 cm. The left common iliac artery measures 2.1 x 1.7 cm. Maximum Diameter: 7.2 cm in the transverse plane. IMPRESSION: Interval growth of an infrarenal abdominal aortic aneurysm which measures 6.7 cm AP x 7.2 cm transversely. It is 9.2 cm in length and extends into the left common iliac artery. The proximal and mid aorta could not be imaged due to bowel gas and the patient's clinical condition which limited changes in position. These results will be called to the ordering clinician or representative by the Radiologist Assistant, and communication documented in the PACS or zVision Dashboard. Electronically Signed   By: David  [place] M.D.   On: 04/15/2016 13:42   Dg Chest  Port 1 View  Result Date: 04/17/2016 CLINICAL DATA:  Acute onset of respiratory distress. Initial encounter. EXAM: PORTABLE CHEST 1 VIEW COMPARISON:  Chest radiograph performed 04/14/2016 FINDINGS: The lungs are well-aerated. Bibasilar airspace opacities  raise concern for pneumonia, though mild interstitial edema could have a similar appearance. No definite pleural effusion or pneumothorax is seen. The cardiomediastinal silhouette is mildly enlarged. A pacemaker is noted overlying the left chest wall, with leads ending overlying the right atrium and right ventricle. No acute osseous abnormalities are seen. IMPRESSION: 1. Bibasilar airspace opacities raise concern for pneumonia, though mild interstitial edema could have a similar appearance. 2. Mild cardiomegaly. Electronically Signed   By: Roanna Raider M.D.   On: 04/17/2016 23:46   Dg Knee Right Port  Result Date: 04/22/2016 CLINICAL DATA:  Right knee pain, swelling.  Recent IM nail placement EXAM: PORTABLE RIGHT KNEE - 1-2 VIEW COMPARISON:  04/14/2016 FINDINGS: The distal portion of a right femoral IM nail is visualized. No hardware complicating feature. Advanced degenerative joint disease changes with joint space narrowing and spurring, most pronounced in the patellofemoral compartment. Small joint effusion. No acute fracture, subluxation or dislocation. IMPRESSION: Postoperative changes. Advanced degenerative changes. Note findings. Electronically Signed   By: Charlett Nose M.D.   On: 04/22/2016 09:50   Dg Abd Portable 2v  Result Date: 04/17/2016 CLINICAL DATA:  Abdominal distention. EXAM: PORTABLE ABDOMEN - 2 VIEW COMPARISON:  CT abdomen and pelvis 10/24/2014. FINDINGS: No free intraperitoneal air is identified. There is mild gaseous distention of small and large bowel diffusely. Prominent stool burden ascending colon noted. IMPRESSION: Bowel gas pattern most in keeping with ileus. Large stool burden ascending colon. Electronically Signed   By: Drusilla Kanner M.D.   On: 04/17/2016 12:11   Dg C-arm 1-60 Min  Result Date: 04/19/2016 CLINICAL DATA:  Right femur fracture EXAM: DG C-ARM 61-120 MIN; RIGHT FEMUR 2 VIEWS COMPARISON:  04/14/2016 FINDINGS: Fluoroscopy shows intramedullary nail and dynamic hip screw fixation of an intertrochanteric femur fracture. Fracture alignment is significantly improved. The lesser trochanter fragment remains distracted. No evidence of intraoperative fracture. IMPRESSION: Fluoroscopy for intertrochanteric femur fracture fixation. No unexpected finding. Electronically Signed   By: Marnee Spring M.D.   On: 04/19/2016 13:38   Dg Hip Unilat W Or Wo Pelvis 2-3 Views Right  Result Date: 04/14/2016 CLINICAL DATA:  Fall today with right hip pain and deformity EXAM: DG HIP (WITH OR WITHOUT PELVIS) 2-3V RIGHT COMPARISON:  None. FINDINGS: Bones are diffusely demineralized. Comminuted intertrochanteric right femoral neck fracture noted with varus angulation. Lesser trochanter exists as a free fragment. SI joints and symphysis pubis unremarkable. Distal aortic and iliac artery atherosclerosis evident. IMPRESSION: 1. Comminuted intertrochanteric right femoral neck fracture with varus angulation. 2.  Abdominal Aortic Atherosclerois (ICD10-170.0) Electronically Signed   By: Kennith Center M.D.   On: 04/14/2016 17:01   Dg Femur, Min 2 Views Right  Result Date: 04/19/2016 CLINICAL DATA:  Right femur fracture EXAM: DG C-ARM 61-120 MIN; RIGHT FEMUR 2 VIEWS COMPARISON:  04/14/2016 FINDINGS: Fluoroscopy shows intramedullary nail and dynamic hip screw fixation of an intertrochanteric femur fracture. Fracture alignment is significantly improved. The lesser trochanter fragment remains distracted. No evidence of intraoperative fracture. IMPRESSION: Fluoroscopy for intertrochanteric femur fracture fixation. No unexpected finding. Electronically Signed   By: Marnee Spring M.D.   On: 04/19/2016 13:38    Assessment/Plan   Leukocytosis-I do note  he is on prednisone he does not really show any signs of overt infection hear no dysuria Increased cough congestion denies diarrhea-he appears to be doing well in this regards at this point will monitor and update a BMP later this week on Thursday.  #2 renal insufficiency this appears stabilized with a  creatinine of 1.42 we'll update this as well.  #3 history of right knee effusion pain he is on prednisone also-on hydrocodone as needed-will add topical Biofreeze when necessary to see if this helps as well.  [id number]

## 2016-04-30 ENCOUNTER — Encounter: Payer: Self-pay | Admitting: Internal Medicine

## 2016-04-30 ENCOUNTER — Encounter (HOSPITAL_COMMUNITY)
Admission: AD | Admit: 2016-04-30 | Discharge: 2016-04-30 | Disposition: A | Discharge status: Home / Self Care | Payer: Medicare Other | Source: Skilled Nursing Facility | Attending: *Deleted | Admitting: *Deleted

## 2016-04-30 ENCOUNTER — Non-Acute Institutional Stay (SKILLED_NURSING_FACILITY): Payer: Medicare Other | Admitting: Internal Medicine

## 2016-04-30 DIAGNOSIS — N183 Chronic kidney disease, stage 3 unspecified: Secondary | ICD-10-CM

## 2016-04-30 DIAGNOSIS — D72829 Elevated white blood cell count, unspecified: Secondary | ICD-10-CM | POA: Diagnosis not present

## 2016-04-30 LAB — BASIC METABOLIC PANEL
Anion gap: 5 (ref 5–15)
BUN: 65 mg/dL — ABNORMAL HIGH (ref 6–20)
CHLORIDE: 100 mmol/L — AB (ref 101–111)
CO2: 29 mmol/L (ref 22–32)
Calcium: 8.6 mg/dL — ABNORMAL LOW (ref 8.9–10.3)
Creatinine, Ser: 1.53 mg/dL — ABNORMAL HIGH (ref 0.61–1.24)
GFR, EST AFRICAN AMERICAN: 44 mL/min — AB (ref >60)
GFR, EST NON AFRICAN AMERICAN: 38 mL/min — AB (ref >60)
Glucose, Bld: 111 mg/dL — ABNORMAL HIGH (ref 65–99)
POTASSIUM: 4.1 mmol/L (ref 3.5–5.1)
SODIUM: 134 mmol/L — AB (ref 135–145)

## 2016-04-30 LAB — CBC WITH DIFFERENTIAL/PLATELET
BASOS ABS: 0 10*3/uL (ref 0.0–0.1)
BASOS PCT: 0 %
EOS ABS: 0.2 10*3/uL (ref 0.0–0.7)
Eosinophils Relative: 1 %
HCT: 32.7 % — ABNORMAL LOW (ref 39.0–52.0)
HEMOGLOBIN: 11.2 g/dL — AB (ref 13.0–17.0)
Lymphocytes Relative: 16 %
Lymphs Abs: 2.6 10*3/uL (ref 0.7–4.0)
MCH: 34.7 pg — ABNORMAL HIGH (ref 26.0–34.0)
MCHC: 34.3 g/dL (ref 30.0–36.0)
MCV: 101.2 fL — ABNORMAL HIGH (ref 78.0–100.0)
Monocytes Absolute: 1.4 10*3/uL — ABNORMAL HIGH (ref 0.1–1.0)
Monocytes Relative: 9 %
NEUTROS PCT: 74 %
Neutro Abs: 12 10*3/uL — ABNORMAL HIGH (ref 1.7–7.7)
Platelets: 347 10*3/uL (ref 150–400)
RBC: 3.23 MIL/uL — AB (ref 4.22–5.81)
RDW: 16.9 % — ABNORMAL HIGH (ref 11.5–15.5)
WBC: 16.2 10*3/uL — AB (ref 4.0–10.5)

## 2016-04-30 NOTE — Progress Notes (Signed)
Location:   [facility] Room Number: 128/P Place of Service:  SNF (31) Provider:  Malachi Bonds, MD  Patient Care Team: Oval Linsey, MD as PCP - General (Internal Medicine) Hillis Range, MD as Consulting Physician (Cardiology) Rinaldo Cloud, MD as Consulting Physician (Cardiology)  Extended Emergency Contact Information Primary Emergency Contact: Gohlke,Nola Address: [address]          [place], [place] [address] [place] Home Phone: [phone] Mobile Phone: [phone] Relation: Spouse  Code Status:  DNR Goals of care: Advanced Directive information Advanced Directives 04/30/2016  Does Patient Have a Medical Advance Directive? Yes  Type of Advance Directive Out of facility DNR (pink MOST or yellow form)  Does patient want to make changes to medical advance directive? No - Patient declined  Copy of Healthcare Power of Attorney in Chart? -  Would patient like information on creating a medical advance directive? No - Patient declined  Pre-existing out of facility DNR order (yellow form or pink MOST form) -     Chief Complaint  Patient presents with  . Acute Visit    F/U for Leukocytosis    HPI:  Pt is a 83 y.o. male seen today for an acute visit for Follow-up of leukocytosis.  Patient is here after sustaining a fall and had a right hip repair-she also has a right knee effusion and is completing a course of prednisone.  Routine labs showed his white count was elevated at 1. 12,900 did rise to 15,300  Today's  lab was  16,200.  He continues to deny any fever or chills increased cough congestion or dysuria denies any diarrhea as well.  It is thought the prednisone is probably causing this.     Past Medical History:  Diagnosis Date  . AAA (abdominal aortic aneurysm) (HCC)    a. 02/2009 U/S 4.4 x 4.5 cm  . BPH (benign prostatic hyperplasia)   . CKD (chronic kidney disease), stage III   . Coronary  artery disease   . DJD (degenerative joint disease)   . History of tobacco abuse    a. roughly 36 pack years, quit @ age 41.  Marland Kitchen Hypertension   . Hypothyroidism   . Non-ischemic cardiomyopathy (HCC)   . PAF (paroxysmal atrial fibrillation) (HCC)    a. noted 11/2011  . Pancreatitis    a. 10/2011 - managed conservatively @ home.  . Symptomatic bradycardia    a. s/p STJ dual chamber PPM   . Systolic heart failure (HCC)    02/2013   Past Surgical History:  Procedure Laterality Date  . CATARACT EXTRACTION W/ INTRAOCULAR LENS  IMPLANT, BILATERAL Bilateral 1980's  . CORONARY ANGIOGRAM  11/19/2011   Procedure: CORONARY ANGIOGRAM;  Surgeon: [name], MD;  Location: [facility] CATH LAB;  Service: Cardiovascular;;  . CORONARY ANGIOPLASTY WITH STENT PLACEMENT  12/22/2011    LAD & CIRCUMFLEX  . EYE SURGERY    . INTRAMEDULLARY (IM) NAIL INTERTROCHANTERIC Right 04/19/2016   Procedure: INTRAMEDULLARY (IM) NAIL INTERTROCHANTRIC;  Surgeon: Sheral Apley, MD;  Location: [facility] OR;  Service: Orthopedics;  Laterality: Right;  . PERCUTANEOUS CORONARY STENT INTERVENTION (PCI-S) N/A 12/22/2011   Procedure: PERCUTANEOUS CORONARY STENT INTERVENTION (PCI-S);  Surgeon: [name], MD;  Location: [facility] CATH LAB;  Service: Cardiovascular;  Laterality: N/A;  . PERMANENT PACEMAKER INSERTION N/A 11/20/2011   STJ dual chamber PPM implanted by [name] for symptomatic bradycardia   . TONSILLECTOMY  ~ 1933  Allergies  Allergen Reactions  . Iodinated Diagnostic Agents Rash and Swelling    Swelling in face, urticaria. Received Isovoc 370 (non-ionic) on 10/24/14.  . Isovue [Iopamidol] Itching and Swelling    Pt had slight erythema, itching and facial swelling right ear and right lower chin.  Pt sneezed several times after contrast injection.  Pt was monitored for 1 hr post injection and was given water to drink. Dr Gery Pray feels he had a very mild reaction.  Premeds are warranted in this patient.  Thanks, Gildardo Griffes     Current Outpatient Prescriptions on File Prior to Visit  Medication Sig Dispense Refill  . acetaminophen (TYLENOL) 500 MG tablet Take 500 mg by mouth daily.    Marland Kitchen allopurinol (ZYLOPRIM) 100 MG tablet Take 1 tablet (100 mg total) by mouth daily.    Marland Kitchen amiodarone (PACERONE) 200 MG tablet Take 100 mg by mouth daily.    Marland Kitchen apixaban (ELIQUIS) 2.5 MG TABS tablet Take 2.5 mg by mouth 2 (two) times daily.    Marland Kitchen atorvastatin (LIPITOR) 80 MG tablet Take 40 mg by mouth daily at 6 PM.    . carvedilol (COREG) 3.125 MG tablet Take 1 tablet (3.125 mg total) by mouth 2 (two) times daily with a meal.    . cetirizine (ZYRTEC ALLERGY) 10 MG tablet Take 10 mg by mouth daily as needed for allergies or rhinitis.     . famotidine (PEPCID) 20 MG tablet Take 20 mg by mouth every evening.     . feeding supplement, ENSURE ENLIVE, (ENSURE ENLIVE) LIQD Take 237 mLs by mouth 2 (two) times daily between meals. 237 mL 12  . finasteride (PROSCAR) 5 MG tablet Take 5 mg by mouth every evening.     . fish oil-omega-3 fatty acids 1000 MG capsule Take 1 g by mouth every morning.     . furosemide (LASIX) 20 MG tablet Take 60 mg by mouth.    Marland Kitchen HYDROcodone-acetaminophen (NORCO/VICODIN) 5-325 MG tablet Take 1 tablet by mouth every 6 (six) hours as needed for severe pain. 120 tablet 0  . ipratropium-albuterol (DUONEB) 0.5-2.5 (3) MG/3ML SOLN Take 3 mLs by nebulization 3 (three) times daily. 360 mL   . levothyroxine (SYNTHROID, LEVOTHROID) 75 MCG tablet Take 75 mcg by mouth daily before breakfast.  5  . linaclotide (LINZESS) 72 MCG capsule Take 72 mcg by mouth daily as needed (for constipation).    . Menthol, Topical Analgesic, (BLUE-EMU MAXIMUM STRENGTH EX) Apply 1 application topically daily. Applied to knees    . Multiple Vitamin (MULTIVITAMIN WITH MINERALS) TABS tablet Take 1 tablet by mouth daily.    . nitroGLYCERIN (NITROSTAT) 0.4 MG SL tablet Place 1 tablet (0.4 mg total) under the tongue every 5 (five) minutes x 3 doses as needed  for chest pain. 25 tablet 3  . polyethylene glycol powder (GLYCOLAX/MIRALAX) powder Take 17 g by mouth daily as needed for moderate constipation.     . senna-docusate (SENOKOT-S) 8.6-50 MG tablet Take 1 tablet by mouth 2 (two) times daily.     No current facility-administered medications on file prior to visit.      Review of Systems In general is not complaining of a fever or chills   Skin does not complain of rashes or itching he does have bruising left hip site Steri-Strips are in place   Head ears eyes nose mouth and throat does not complain of any sore throat or visual changes.  Respiratory denies any cough or shortness of breath.  Cardiac  is not complaining of any chest pain has some mild edema of the surgical site postop otherwise I don't really appreciate significant edema.  GI is not complaining of any nausea vomiting diarrhea constipation or abdominal discomfort.  GU is not complaining of any overt dysuria.  Muscl skeletal says he does have some right leg knee pain but this appears to be gradually improving since he received  prednisone  Neurologic is not complaining dizziness headache or numbness.  Psych does not complain of anxiety or depression continues to be in good spirits.  Immunization History  Administered Date(s) Administered  . Influenza-Unspecified 11/21/2011, 10/17/2012  . Pneumococcal-Unspecified 10/17/2012   Pertinent  Health Maintenance Due  Topic Date Due  . INFLUENZA VACCINE  01/16/2017 (Originally 09/17/2015)  . PNA vac Low Risk Adult (2 of 2 - PCV13) 01/16/2017 (Originally 10/17/2013)   No flowsheet data found. Functional Status Survey:    Vitals:   04/30/16 1637  BP: 127/81  Pulse: 93  Resp: 18  Temp: 98.1 F (36.7 C)  TempSrc: Oral   There is no height or weight on file to calculate BMI. Physical Exam  General this is a very pleasant elderly  male who looks somewhat younger than his stated age.  His skin is warm and dry  does have violaceous bruising around the right hip surgical site as well as Steri-Strips in place with no sign of infection.  Eyes visual acuity appears grossly intact sclera and conjunctiva are clear.  Oropharynx is clear mucous membranes moist.  Chest is largely clear to auscultation there is some very minimal spray wheezing on the right this is quite minute however there is no labored breathing.    Heart is regular rate and rhythm without murmur gallop or rub he has edema of the righft surgical site lower extremity edema below this appears fairly unremarkable pedal pulses are intact bilaterally.  Continues to have some edema of his right knee but this appears to be decreasing I do not really see any significant erythema or increased warmth from baseline there is some tenderness to palpation  Abdomen is soft nontender with positive bowel sounds.  GU could not really appreciate any suprapubic tenderness.  Musculoskeletal does have limited mobility of right leg as noted above with some violaceous bruising of the hip.  I don't see any sign of cellulitis drainage bleeding or significant infection.   Neurologic is intact no lateralizing findings speech is clear cranial nerves are intact.  Psych he is alert and oriented very pleasant and appropriate quite conversant  Labs reviewed:  Recent Labs  04/24/16 0615 04/27/16 0700 04/30/16 0700  NA 134* 134* 134*  K 4.5 4.4 4.1  CL 104 99* 100*  CO2 22 27 29   GLUCOSE 115* 91 111*  BUN 60* 62* 65*  CREATININE 1.38* 1.42* 1.53*  CALCIUM 8.7* 8.9 8.6*    Recent Labs  04/14/16 1609 04/18/16 0029  AST 18 33  ALT 11* 11*  ALKPHOS 44 36*  BILITOT 0.6 0.8  PROT 6.8 5.6*  ALBUMIN 3.6 2.7*    Recent Labs  04/24/16 0615 04/27/16 0700 04/30/16 0700  WBC 12.9* 15.3* 16.2*  NEUTROABS 10.1* 11.5* 12.0*  HGB 9.4* 10.7* 11.2*  HCT 26.3* 31.1* 32.7*  MCV 97.0 99.0 101.2*  PLT 275 358 347   Lab Results  Component  Value Date   TSH 7.076 (H) 04/27/2016   No results found for: HGBA1C Lab Results  Component Value Date   CHOL 124 11/19/2011   HDL  46 11/19/2011   LDLCALC 61 11/19/2011   TRIG 87 11/19/2011   CHOLHDL 2.7 11/19/2011    Significant Diagnostic Results in last 30 days:  Ct Abdomen Pelvis Wo Contrast  Result Date: 04/19/2016 CLINICAL DATA:  Status post acute right hip fracture. Patient on anticoagulation for atrial fibrillation. Acute on chronic anemia. Assess for right hip hematoma or retroperitoneal hemorrhage. Initial encounter. EXAM: CT ABDOMEN AND PELVIS WITHOUT CONTRAST TECHNIQUE: Multidetector CT imaging of the abdomen and pelvis was performed following the standard protocol without IV contrast. COMPARISON:  CT of the abdomen and pelvis from 10/24/2014 FINDINGS: Lower chest: Trace bilateral pleural effusions are noted. Bilateral emphysema is seen, with underlying bibasilar fibrotic change. Diffuse coronary artery calcifications are seen. A pacemaker lead is partially imaged. Hepatobiliary: The liver is unremarkable in appearance. The gallbladder is unremarkable in appearance. The common bile duct remains normal in caliber. Pancreas: The pancreas is diffusely atrophic and grossly unremarkable. Spleen: The spleen is unremarkable in appearance. Adrenals/Urinary Tract: The adrenal glands are unremarkable in appearance. Nonspecific perinephric stranding is noted bilaterally. A small hyperdense cyst is noted at the anterior aspect of the right kidney. A small 3 mm calcification is noted at the upper pole of the right kidney. There is no evidence of hydronephrosis. No obstructing ureteral stones are seen. Stomach/Bowel: The stomach is unremarkable in appearance. The small bowel is within normal limits. The appendix is not visualized; there is no evidence for appendicitis. The colon is unremarkable in appearance. Vascular/Lymphatic: There is aneurysmal dilatation of the infrarenal abdominal aorta to 6.4 cm  in AP dimension and 6.5 cm in transverse dimension. Diffuse calcification is seen along the abdominal aorta and its branches, including along the superior mesenteric artery. Aneurysmal dilatation resolves at the aortic bifurcation. There is diffuse calcification along the common iliac arteries, external and internal iliac arteries, and common femoral arteries bilaterally. No retroperitoneal or pelvic sidewall lymphadenopathy is seen. Reproductive: The bladder is mildly distended and grossly unremarkable. The prostate is enlarged, measuring 7.2 cm in transverse dimension. Other: Mild diffuse soft tissue hemorrhage is seen tracking along the right thigh and right hip, without a well-defined hematoma. Musculoskeletal: There is a comminuted right femoral intertrochanteric fracture, with a displaced lesser trochanteric fragment. Mild intramuscular hemorrhage is seen along the proximal right thigh. Multilevel vacuum phenomenon is noted along the lower thoracic and lumbar spine. IMPRESSION: 1. Comminuted right femoral intertrochanteric fracture, with a displaced lesser trochanteric fragment. 2. Mild intramuscular hemorrhage along the proximal right thigh, with mild diffuse overlying soft tissue hemorrhage. No well-defined hematoma seen. 3. Interval increase in size of large infrarenal abdominal aortic aneurysm, measuring 6.4 cm in AP dimension and 6.5 cm in transverse dimension. Vascular surgery consultation recommended due to increased risk of rupture for AAA >5.5 cm. This recommendation follows ACR consensus guidelines: White Paper of the ACR Incidental Findings Committee II on Vascular Findings. J Am Coll Radiol 2013; 10:789-794. 4. Diffuse aortic atherosclerosis. Diffuse calcification along the superior mesenteric artery, common iliac arteries, external and internal iliac arteries, and common femoral arteries bilaterally. 5. Markedly enlarged prostate.  Would correlate with PSA. 6. Trace bilateral pleural effusions.  Bilateral emphysema, with underlying bibasilar fibrotic change. 7. Diffuse coronary artery calcifications seen. 8. Small 3 mm nonobstructing stone at the upper pole of the right kidney. Small hyperdense right renal cyst noted. These results were called by telephone at the time of interpretation on 04/19/2016 at 6:11 am to [facility] on [facility], who verbally acknowledged these results. Electronically Signed  By: Roanna Raider M.D.   On: 04/19/2016 06:13   Dg Chest 1 View  Result Date: 04/14/2016 CLINICAL DATA:  Dizziness.  Fall. EXAM: CHEST 1 VIEW FINDINGS: Cardiac pacer with lead tips over the right atrium right ventricle. Cardiomegaly. Mild pulmonary vascular prominence with mild interstitial prominence. Mild CHF cannot be excluded. Underlying chronic interstitial lung disease most likely present. Small right pleural effusion cannot be excluded. Left costophrenic angle not imaged. No pneumothorax. No acute bony abnormalities. Degenerative changes both shoulders. IMPRESSION: 1. Cardiac pacer stable position. Cardiomegaly with mild bilateral from interstitial prominence suggesting mild CHF. Tiny right pleural effusion cannot be excluded. 2. Underlying chronic interstitial lung disease most likely present . Electronically Signed   By: Maisie Fus  Register   On: 04/14/2016 17:00   Dg Knee 1-2 Views Right  Result Date: 04/14/2016 CLINICAL DATA:  Fall. EXAM: RIGHT KNEE - 1-2 VIEW COMPARISON:  04/14/2016. FINDINGS: Soft tissue swelling. Small effusion cannot be excluded. Severe tricompartment degenerative change in osteopenia. Subtle fracture, age undetermined, of the superior aspect of patella cannot be excluded. Bipartite patella could also present in this fashion. Peripheral vascular calcification . IMPRESSION: 1. Soft tissue swelling. Small effusion cannot be excluded. Subtle fracture, age undetermined, superior aspect of the patella. Bipartite patella could also present in this fashion. 2. Diffuse osteopenia and  severe degenerative change. 3. Peripheral vascular disease. Electronically Signed   By: Maisie Fus  Register   On: 04/14/2016 17:02   US Aorta  Result Date: 04/15/2016 CLINICAL DATA:  Follow-up of an abdominal aortic aneurysm measuring 5.6 x 5.7 cm seen on CT angiogram of the abdomen pelvis of October 24, 2014 EXAM: ULTRASOUND OF ABDOMINAL AORTA TECHNIQUE: Ultrasound examination of the abdominal aorta was performed to evaluate for abdominal aortic aneurysm. COMPARISON:  CT angiogram of October 24, 2014 FINDINGS: Abdominal Aorta The proximal and mid aorta are obscured by bowel gas. There is an infrarenal abdominal aortic aneurysm measuring 6.7 cm AP x 7.2 cm transversely. There is considerable mural plaque and thrombus. The luminal diameter is approximately 3.5 cm. The aneurysm is 9.2 cm in length. The right common iliac artery measures 1.6 x 1.8 cm. The left common iliac artery measures 2.1 x 1.7 cm. Maximum Diameter: 7.2 cm in the transverse plane. IMPRESSION: Interval growth of an infrarenal abdominal aortic aneurysm which measures 6.7 cm AP x 7.2 cm transversely. It is 9.2 cm in length and extends into the left common iliac artery. The proximal and mid aorta could not be imaged due to bowel gas and the patient's clinical condition which limited changes in position. These results will be called to the ordering clinician or representative by the Radiologist Assistant, and communication documented in the PACS or zVision Dashboard. Electronically Signed   By: David  [place] M.D.   On: 04/15/2016 13:42   Dg Chest Port 1 View  Result Date: 04/17/2016 CLINICAL DATA:  Acute onset of respiratory distress. Initial encounter. EXAM: PORTABLE CHEST 1 VIEW COMPARISON:  Chest radiograph performed 04/14/2016 FINDINGS: The lungs are well-aerated. Bibasilar airspace opacities raise concern for pneumonia, though mild interstitial edema could have a similar appearance. No definite pleural effusion or pneumothorax is seen. The  cardiomediastinal silhouette is mildly enlarged. A pacemaker is noted overlying the left chest wall, with leads ending overlying the right atrium and right ventricle. No acute osseous abnormalities are seen. IMPRESSION: 1. Bibasilar airspace opacities raise concern for pneumonia, though mild interstitial edema could have a similar appearance. 2. Mild cardiomegaly. Electronically Signed   By: Leotis Shames  Chang M.D.   On: 04/17/2016 23:46   Dg Knee Right Port  Result Date: 04/22/2016 CLINICAL DATA:  Right knee pain, swelling.  Recent IM nail placement EXAM: PORTABLE RIGHT KNEE - 1-2 VIEW COMPARISON:  04/14/2016 FINDINGS: The distal portion of a right femoral IM nail is visualized. No hardware complicating feature. Advanced degenerative joint disease changes with joint space narrowing and spurring, most pronounced in the patellofemoral compartment. Small joint effusion. No acute fracture, subluxation or dislocation. IMPRESSION: Postoperative changes. Advanced degenerative changes. Note findings. Electronically Signed   By: Charlett Nose M.D.   On: 04/22/2016 09:50   Dg Abd Portable 2v  Result Date: 04/17/2016 CLINICAL DATA:  Abdominal distention. EXAM: PORTABLE ABDOMEN - 2 VIEW COMPARISON:  CT abdomen and pelvis 10/24/2014. FINDINGS: No free intraperitoneal air is identified. There is mild gaseous distention of small and large bowel diffusely. Prominent stool burden ascending colon noted. IMPRESSION: Bowel gas pattern most in keeping with ileus. Large stool burden ascending colon. Electronically Signed   By: Drusilla Kanner M.D.   On: 04/17/2016 12:11   Dg C-arm 1-60 Min  Result Date: 04/19/2016 CLINICAL DATA:  Right femur fracture EXAM: DG C-ARM 61-120 MIN; RIGHT FEMUR 2 VIEWS COMPARISON:  04/14/2016 FINDINGS: Fluoroscopy shows intramedullary nail and dynamic hip screw fixation of an intertrochanteric femur fracture. Fracture alignment is significantly improved. The lesser trochanter fragment remains distracted.  No evidence of intraoperative fracture. IMPRESSION: Fluoroscopy for intertrochanteric femur fracture fixation. No unexpected finding. Electronically Signed   By: Marnee Spring M.D.   On: 04/19/2016 13:38   Dg Hip Unilat W Or Wo Pelvis 2-3 Views Right  Result Date: 04/14/2016 CLINICAL DATA:  Fall today with right hip pain and deformity EXAM: DG HIP (WITH OR WITHOUT PELVIS) 2-3V RIGHT COMPARISON:  None. FINDINGS: Bones are diffusely demineralized. Comminuted intertrochanteric right femoral neck fracture noted with varus angulation. Lesser trochanter exists as a free fragment. SI joints and symphysis pubis unremarkable. Distal aortic and iliac artery atherosclerosis evident. IMPRESSION: 1. Comminuted intertrochanteric right femoral neck fracture with varus angulation. 2.  Abdominal Aortic Atherosclerois (ICD10-170.0) Electronically Signed   By: Kennith Center M.D.   On: 04/14/2016 17:01   Dg Femur, Min 2 Views Right  Result Date: 04/19/2016 CLINICAL DATA:  Right femur fracture EXAM: DG C-ARM 61-120 MIN; RIGHT FEMUR 2 VIEWS COMPARISON:  04/14/2016 FINDINGS: Fluoroscopy shows intramedullary nail and dynamic hip screw fixation of an intertrochanteric femur fracture. Fracture alignment is significantly improved. The lesser trochanter fragment remains distracted. No evidence of intraoperative fracture. IMPRESSION: Fluoroscopy for intertrochanteric femur fracture fixation. No unexpected finding. Electronically Signed   By: Marnee Spring M.D.   On: 04/19/2016 13:38    Assessment/Plan  1 leukocytosis-she continues to be asymptomatic with no complaints of fever chills diarrhea increased cough or congestion or dysuria.  I suspect this is probably due to the prednisone but will have to keep a close eye on this will update a CBC with differential on Monday, March 19.  #2 renal insufficiency creatinine of 1.53 on lab today shows a mild increase will update this as well next Monday to make sure it does not  continue to rise  [id number]

## 2016-05-01 ENCOUNTER — Ambulatory Visit (HOSPITAL_COMMUNITY): Payer: No Typology Code available for payment source | Attending: Internal Medicine

## 2016-05-01 DIAGNOSIS — M25551 Pain in right hip: Secondary | ICD-10-CM | POA: Insufficient documentation

## 2016-05-01 DIAGNOSIS — Z9889 Other specified postprocedural states: Secondary | ICD-10-CM | POA: Insufficient documentation

## 2016-05-01 DIAGNOSIS — I708 Atherosclerosis of other arteries: Secondary | ICD-10-CM | POA: Insufficient documentation

## 2016-05-04 ENCOUNTER — Encounter (HOSPITAL_COMMUNITY)
Admission: RE | Admit: 2016-05-04 | Discharge: 2016-05-04 | Disposition: A | Discharge status: Home / Self Care | Payer: Medicare Other | Source: Skilled Nursing Facility | Attending: Internal Medicine | Admitting: Internal Medicine

## 2016-05-04 LAB — BASIC METABOLIC PANEL
ANION GAP: 6 (ref 5–15)
BUN: 53 mg/dL — ABNORMAL HIGH (ref 6–20)
CHLORIDE: 104 mmol/L (ref 101–111)
CO2: 26 mmol/L (ref 22–32)
Calcium: 8.4 mg/dL — ABNORMAL LOW (ref 8.9–10.3)
Creatinine, Ser: 1.55 mg/dL — ABNORMAL HIGH (ref 0.61–1.24)
GFR calc Af Amer: 44 mL/min — ABNORMAL LOW (ref >60)
GFR, EST NON AFRICAN AMERICAN: 38 mL/min — AB (ref >60)
GLUCOSE: 89 mg/dL (ref 65–99)
POTASSIUM: 4.4 mmol/L (ref 3.5–5.1)
Sodium: 136 mmol/L (ref 135–145)

## 2016-05-04 LAB — CBC WITH DIFFERENTIAL/PLATELET
BASOS PCT: 0 %
Basophils Absolute: 0 10*3/uL (ref 0.0–0.1)
Eosinophils Absolute: 0.2 10*3/uL (ref 0.0–0.7)
Eosinophils Relative: 2 %
HEMATOCRIT: 30 % — AB (ref 39.0–52.0)
Hemoglobin: 10.2 g/dL — ABNORMAL LOW (ref 13.0–17.0)
Lymphocytes Relative: 12 %
Lymphs Abs: 1.3 10*3/uL (ref 0.7–4.0)
MCH: 34.6 pg — ABNORMAL HIGH (ref 26.0–34.0)
MCHC: 34 g/dL (ref 30.0–36.0)
MCV: 101.7 fL — ABNORMAL HIGH (ref 78.0–100.0)
MONO ABS: 1.3 10*3/uL — AB (ref 0.1–1.0)
MONOS PCT: 12 %
NEUTROS ABS: 8.1 10*3/uL — AB (ref 1.7–7.7)
Neutrophils Relative %: 74 %
Platelets: 245 10*3/uL (ref 150–400)
RBC: 2.95 MIL/uL — ABNORMAL LOW (ref 4.22–5.81)
RDW: 18.1 % — AB (ref 11.5–15.5)
WBC: 10.9 10*3/uL — ABNORMAL HIGH (ref 4.0–10.5)

## 2016-05-04 LAB — HEPATIC FUNCTION PANEL
ALK PHOS: 114 U/L (ref 38–126)
ALT: 23 U/L (ref 17–63)
AST: 21 U/L (ref 15–41)
Albumin: 2.9 g/dL — ABNORMAL LOW (ref 3.5–5.0)
BILIRUBIN DIRECT: 0.3 mg/dL (ref 0.1–0.5)
Indirect Bilirubin: 0.7 mg/dL (ref 0.3–0.9)
Total Bilirubin: 1 mg/dL (ref 0.3–1.2)
Total Protein: 5.6 g/dL — ABNORMAL LOW (ref 6.5–8.1)

## 2016-05-04 LAB — LIPID PANEL
CHOLESTEROL: 81 mg/dL (ref 0–200)
HDL: 43 mg/dL (ref >40)
LDL Cholesterol: 31 mg/dL (ref 0–99)
Total CHOL/HDL Ratio: 1.9 RATIO
Triglycerides: 37 mg/dL (ref <150)
VLDL: 7 mg/dL (ref 0–40)

## 2016-05-04 LAB — TSH: TSH: 8.531 u[IU]/mL — AB (ref 0.350–4.500)

## 2016-05-07 ENCOUNTER — Non-Acute Institutional Stay (SKILLED_NURSING_FACILITY): Payer: Medicare Other | Admitting: Internal Medicine

## 2016-05-07 ENCOUNTER — Encounter: Payer: Self-pay | Admitting: Internal Medicine

## 2016-05-07 ENCOUNTER — Ambulatory Visit (HOSPITAL_COMMUNITY)
Admit: 2016-05-07 | Discharge: 2016-05-07 | Disposition: A | Discharge status: Home / Self Care | Payer: No Typology Code available for payment source | Attending: Internal Medicine | Admitting: Internal Medicine

## 2016-05-07 DIAGNOSIS — M79604 Pain in right leg: Secondary | ICD-10-CM | POA: Insufficient documentation

## 2016-05-07 DIAGNOSIS — I480 Paroxysmal atrial fibrillation: Secondary | ICD-10-CM | POA: Diagnosis not present

## 2016-05-07 DIAGNOSIS — S72144D Nondisplaced intertrochanteric fracture of right femur, subsequent encounter for closed fracture with routine healing: Secondary | ICD-10-CM

## 2016-05-07 DIAGNOSIS — I714 Abdominal aortic aneurysm, without rupture, unspecified: Secondary | ICD-10-CM

## 2016-05-07 DIAGNOSIS — M7989 Other specified soft tissue disorders: Secondary | ICD-10-CM

## 2016-05-07 DIAGNOSIS — I5041 Acute combined systolic (congestive) and diastolic (congestive) heart failure: Secondary | ICD-10-CM | POA: Diagnosis not present

## 2016-05-07 DIAGNOSIS — I428 Other cardiomyopathies: Secondary | ICD-10-CM | POA: Diagnosis not present

## 2016-05-07 DIAGNOSIS — M79605 Pain in left leg: Secondary | ICD-10-CM | POA: Insufficient documentation

## 2016-05-07 NOTE — Progress Notes (Signed)
Location:   [facility] Room Number: 128/P Place of Service:  SNF (31)  Provider: Einar Crow  PCP: [name], MD Patient Care Team: Oval Linsey, MD as PCP - General (Internal Medicine) Hillis Range, MD as Consulting Physician (Cardiology) Rinaldo Cloud, MD as Consulting Physician (Cardiology)  Extended Emergency Contact Information Primary Emergency Contact: Wachob,Nola Address: [address]          [place], [place] [address] [place] Home Phone: [phone] Mobile Phone: [phone] Relation: Spouse  Code Status: DNR Goals of care:  Advanced Directive information Advanced Directives 05/07/2016  Does Patient Have a Medical Advance Directive? Yes  Type of Advance Directive Out of facility DNR (pink MOST or yellow form)  Does patient want to make changes to medical advance directive? No - Patient declined  Copy of Healthcare Power of Attorney in Chart? -  Would patient like information on creating a medical advance directive? No - Patient declined  Pre-existing out of facility DNR order (yellow form or pink MOST form) -     Allergies  Allergen Reactions  . Iodinated Diagnostic Agents Rash and Swelling    Swelling in face, urticaria. Received Isovoc 370 (non-ionic) on 10/24/14.  . Isovue [Iopamidol] Itching and Swelling    Pt had slight erythema, itching and facial swelling right ear and right lower chin.  Pt sneezed several times after contrast injection.  Pt was monitored for 1 hr post injection and was given water to drink. Dr Gery Pray feels he had a very mild reaction.  Premeds are warranted in this patient.  Thanks, Gildardo Griffes    Chief Complaint  Patient presents with  . Discharge Note    HPI:  83 y.o. male  Seen today for discharge. Patient has number of medical problems including AAA 6..7 X7.2 Cm followed by Dr Pattricia Boss in [place], Atrial fibrilation on Amiodarone and Eliquis, CHF with EF of 20-25 %, Bradycardia  S/P Pacemaker, Chronic Renal insufficency, HTN, hypothyroidism  He was admitted to SNF for therapy S/P Intramedullary Nail surgery for Right hip fracture. He sustained fracture after Fall. His Post op course was complicated by Right Knee effusion and it was treated as acute gout with Allopurinal and Steroid taper. He also had Low Hgb of 7.7, Worsening of renal function and worsening CHF due to IV fluids.  Patient has been doing well in facility. His pain is controlled on tylenol. He says he is able to transfer himself from the Wheelchair to the bed. He has not started to walk yet with walker. Patient wants to go home and continue his therapy at home as he does not like this place. He has very supportive family and they are going to help take care of him. He denies any Chest pain, Cough or SOB. He only c/o swelling in his right leg which according to wife is new . He denies any calf pain.     Past Medical History:  Diagnosis Date  . AAA (abdominal aortic aneurysm) (HCC)    a. 02/2009 U/S 4.4 x 4.5 cm  . BPH (benign prostatic hyperplasia)   . CKD (chronic kidney disease), stage III   . Coronary artery disease   . DJD (degenerative joint disease)   . History of tobacco abuse    a. roughly 36 pack years, quit @ age 44.  Marland Kitchen Hypertension   . Hypothyroidism   . Non-ischemic cardiomyopathy (HCC)   . PAF (paroxysmal atrial fibrillation) (HCC)    a. noted  11/2011  . Pancreatitis    a. 10/2011 - managed conservatively @ home.  . Symptomatic bradycardia    a. s/p STJ dual chamber PPM   . Systolic heart failure (HCC)    02/2013    Past Surgical History:  Procedure Laterality Date  . CATARACT EXTRACTION W/ INTRAOCULAR LENS  IMPLANT, BILATERAL Bilateral 1980's  . CORONARY ANGIOGRAM  11/19/2011   Procedure: CORONARY ANGIOGRAM;  Surgeon: [name], MD;  Location[facility] CATH LAB;  Service: Cardiovascular;;  . CORONARY ANGIOPLASTY WITH STENT PLACEMENT  12/22/2011    LAD & CIRCUMFLEX  . EYE  SURGERY    . INTRAMEDULLARY (IM) NAIL INTERTROCHANTERIC Right 04/19/2016   Procedure: INTRAMEDULLARY (IM) NAIL INTERTROCHANTRIC;  Surgeon: Sheral Apley, MD;  Location: [facility] OR;  Service: Orthopedics;  Laterality: Right;  . PERCUTANEOUS CORONARY STENT INTERVENTION (PCI-S) N/A 12/22/2011   Procedure: PERCUTANEOUS CORONARY STENT INTERVENTION (PCI-S);  Surgeon: [name], MD;  Location: [facility] CATH LAB;  Service: Cardiovascular;  Laterality: N/A;  . PERMANENT PACEMAKER INSERTION N/A 11/20/2011   STJ dual chamber PPM implanted by [name] for symptomatic bradycardia   . TONSILLECTOMY  ~ 1933      reports that he quit smoking about 35 years ago. His smoking use included Cigarettes. He has a 35.00 pack-year smoking history. He has never used smokeless tobacco. He reports that he does not drink alcohol or use drugs. Social History   Social History  . Marital status: Married    Spouse name: N/A  . Number of children: N/A  . Years of education: N/A   Occupational History  . retired    Social History Main Topics  . Smoking status: Former Smoker    Packs/day: 1.00    Years: 35.00    Types: Cigarettes    Quit date: 12/17/1980  . Smokeless tobacco: Never Used     Comment: Quit at age 6.  Marland Kitchen Alcohol use No  . Drug use: No  . Sexual activity: Not Currently   Other Topics Concern  . Not on file   Social History Narrative   Pt lives in [place] with his wife.  He is retired from [employer].  He is relatively active @ home.   Functional Status Survey:    Allergies  Allergen Reactions  . Iodinated Diagnostic Agents Rash and Swelling    Swelling in face, urticaria. Received Isovoc 370 (non-ionic) on 10/24/14.  . Isovue [Iopamidol] Itching and Swelling    Pt had slight erythema, itching and facial swelling right ear and right lower chin.  Pt sneezed several times after contrast injection.  Pt was monitored for 1 hr post injection and was given water to drink. Dr Gery Pray feels  he had a very mild reaction.  Premeds are warranted in this patient.  Thanks, Gildardo Griffes    Pertinent  Health Maintenance Due  Topic Date Due  . INFLUENZA VACCINE  01/16/2017 (Originally 09/17/2015)  . PNA vac Low Risk Adult (2 of 2 - PCV13) 01/16/2017 (Originally 10/17/2013)    Medications: Current Outpatient Prescriptions on File Prior to Visit  Medication Sig Dispense Refill  . acetaminophen (TYLENOL) 500 MG tablet Take 500 mg by mouth daily.    Marland Kitchen allopurinol (ZYLOPRIM) 100 MG tablet Take 1 tablet (100 mg total) by mouth daily.    Marland Kitchen amiodarone (PACERONE) 200 MG tablet Take 100 mg by mouth daily.    Marland Kitchen apixaban (ELIQUIS) 2.5 MG TABS tablet Take 2.5 mg by mouth 2 (two) times daily.    Marland Kitchen  atorvastatin (LIPITOR) 80 MG tablet Take 40 mg by mouth daily at 6 PM.    . carvedilol (COREG) 3.125 MG tablet Take 1 tablet (3.125 mg total) by mouth 2 (two) times daily with a meal.    . cetirizine (ZYRTEC ALLERGY) 10 MG tablet Take 10 mg by mouth daily as needed for allergies or rhinitis.     . famotidine (PEPCID) 20 MG tablet Take 20 mg by mouth every evening.     . finasteride (PROSCAR) 5 MG tablet Take 5 mg by mouth every evening.     . fish oil-omega-3 fatty acids 1000 MG capsule Take 1 g by mouth every morning.     . furosemide (LASIX) 20 MG tablet Take 40 mg by mouth.     Marland Kitchen HYDROcodone-acetaminophen (NORCO/VICODIN) 5-325 MG tablet Take 1 tablet by mouth every 6 (six) hours as needed for severe pain. 120 tablet 0  . ipratropium-albuterol (DUONEB) 0.5-2.5 (3) MG/3ML SOLN Take 3 mLs by nebulization 3 (three) times daily. 360 mL   . levothyroxine (SYNTHROID, LEVOTHROID) 75 MCG tablet Take 75 mcg by mouth daily before breakfast.  5  . linaclotide (LINZESS) 72 MCG capsule Take 72 mcg by mouth daily as needed (for constipation).    . Menthol, Topical Analgesic, (BIOFREEZE) 4 % GEL Apply to right knee for discomfort three times a day prn    . Menthol, Topical Analgesic, (BLUE-EMU MAXIMUM STRENGTH EX) Apply 1  application topically daily. Applied to knees    . Multiple Vitamin (MULTIVITAMIN WITH MINERALS) TABS tablet Take 1 tablet by mouth daily.    . nitroGLYCERIN (NITROSTAT) 0.4 MG SL tablet Place 1 tablet (0.4 mg total) under the tongue every 5 (five) minutes x 3 doses as needed for chest pain. 25 tablet 3  . polyethylene glycol powder (GLYCOLAX/MIRALAX) powder Take 17 g by mouth daily as needed for moderate constipation.     . senna-docusate (SENOKOT-S) 8.6-50 MG tablet Take 1 tablet by mouth 2 (two) times daily.     No current facility-administered medications on file prior to visit.      Review of Systems  Constitutional: Negative for chills, fatigue and fever.  HENT: Negative.   Respiratory: Negative.   Cardiovascular: Positive for leg swelling. Negative for chest pain and palpitations.  Gastrointestinal: Negative.   Genitourinary: Negative.   Musculoskeletal: Positive for joint swelling. Negative for arthralgias, back pain and myalgias.  Skin: Negative.   Neurological: Negative.   Psychiatric/Behavioral: Negative.     Vitals:   05/07/16 1155  BP: 114/73  Pulse: 92  Resp: 20  Temp: 97.1 F (36.2 C)  TempSrc: Oral   There is no height or weight on file to calculate BMI. Physical Exam  Constitutional: He is oriented to person, place, and time. He appears well-developed and well-nourished.  HENT:  Head: Normocephalic.  Mouth/Throat: Oropharynx is clear and moist.  Eyes: Pupils are equal, round, and reactive to light.  Neck: Neck supple.  Cardiovascular: Normal rate and regular rhythm.   Murmur heard. Pulmonary/Chest: Effort normal and breath sounds normal. No respiratory distress. He has no wheezes. He has no rales.  Abdominal: Soft. Bowel sounds are normal. He exhibits no distension. There is no tenderness.  Musculoskeletal:  Has moderate swelling in Right LE trace edema in LLE  Neurological: He is alert and oriented to person, place, and time.  Skin: Skin is warm and  dry.  Psychiatric: He has a normal mood and affect. His behavior is normal. Judgment and thought content normal.  Labs reviewed: Basic Metabolic Panel:  Recent Labs  16/10/96 0700 04/30/16 0700 05/04/16 0700  NA 134* 134* 136  K 4.4 4.1 4.4  CL 99* 100* 104  CO2 27 29 26   GLUCOSE 91 111* 89  BUN 62* 65* 53*  CREATININE 1.42* 1.53* 1.55*  CALCIUM 8.9 8.6* 8.4*   Liver Function Tests:  Recent Labs  04/14/16 1609 04/18/16 0029 05/04/16 0700  AST 18 33 21  ALT 11* 11* 23  ALKPHOS 44 36* 114  BILITOT 0.6 0.8 1.0  PROT 6.8 5.6* 5.6*  ALBUMIN 3.6 2.7* 2.9*   No results for input(s): LIPASE, AMYLASE in the last 8760 hours. No results for input(s): AMMONIA in the last 8760 hours. CBC:  Recent Labs  04/27/16 0700 04/30/16 0700 05/04/16 0700  WBC 15.3* 16.2* 10.9*  NEUTROABS 11.5* 12.0* 8.1*  HGB 10.7* 11.2* 10.2*  HCT 31.1* 32.7* 30.0*  MCV 99.0 101.2* 101.7*  PLT 358 347 245   Cardiac Enzymes: No results for input(s): CKTOTAL, CKMB, CKMBINDEX, TROPONINI in the last 8760 hours. BNP: Invalid input(s): POCBNP CBG:  Recent Labs  04/14/16 1604  GLUCAP 117*    Procedures and Imaging Studies During Stay: Ct Abdomen Pelvis Wo Contrast  Result Date: 04/19/2016 CLINICAL DATA:  Status post acute right hip fracture. Patient on anticoagulation for atrial fibrillation. Acute on chronic anemia. Assess for right hip hematoma or retroperitoneal hemorrhage. Initial encounter. EXAM: CT ABDOMEN AND PELVIS WITHOUT CONTRAST TECHNIQUE: Multidetector CT imaging of the abdomen and pelvis was performed following the standard protocol without IV contrast. COMPARISON:  CT of the abdomen and pelvis from 10/24/2014 FINDINGS: Lower chest: Trace bilateral pleural effusions are noted. Bilateral emphysema is seen, with underlying bibasilar fibrotic change. Diffuse coronary artery calcifications are seen. A pacemaker lead is partially imaged. Hepatobiliary: The liver is unremarkable in  appearance. The gallbladder is unremarkable in appearance. The common bile duct remains normal in caliber. Pancreas: The pancreas is diffusely atrophic and grossly unremarkable. Spleen: The spleen is unremarkable in appearance. Adrenals/Urinary Tract: The adrenal glands are unremarkable in appearance. Nonspecific perinephric stranding is noted bilaterally. A small hyperdense cyst is noted at the anterior aspect of the right kidney. A small 3 mm calcification is noted at the upper pole of the right kidney. There is no evidence of hydronephrosis. No obstructing ureteral stones are seen. Stomach/Bowel: The stomach is unremarkable in appearance. The small bowel is within normal limits. The appendix is not visualized; there is no evidence for appendicitis. The colon is unremarkable in appearance. Vascular/Lymphatic: There is aneurysmal dilatation of the infrarenal abdominal aorta to 6.4 cm in AP dimension and 6.5 cm in transverse dimension. Diffuse calcification is seen along the abdominal aorta and its branches, including along the superior mesenteric artery. Aneurysmal dilatation resolves at the aortic bifurcation. There is diffuse calcification along the common iliac arteries, external and internal iliac arteries, and common femoral arteries bilaterally. No retroperitoneal or pelvic sidewall lymphadenopathy is seen. Reproductive: The bladder is mildly distended and grossly unremarkable. The prostate is enlarged, measuring 7.2 cm in transverse dimension. Other: Mild diffuse soft tissue hemorrhage is seen tracking along the right thigh and right hip, without a well-defined hematoma. Musculoskeletal: There is a comminuted right femoral intertrochanteric fracture, with a displaced lesser trochanteric fragment. Mild intramuscular hemorrhage is seen along the proximal right thigh. Multilevel vacuum phenomenon is noted along the lower thoracic and lumbar spine. IMPRESSION: 1. Comminuted right femoral intertrochanteric  fracture, with a displaced lesser trochanteric fragment. 2. Mild intramuscular hemorrhage along  the proximal right thigh, with mild diffuse overlying soft tissue hemorrhage. No well-defined hematoma seen. 3. Interval increase in size of large infrarenal abdominal aortic aneurysm, measuring 6.4 cm in AP dimension and 6.5 cm in transverse dimension. Vascular surgery consultation recommended due to increased risk of rupture for AAA >5.5 cm. This recommendation follows ACR consensus guidelines: White Paper of the ACR Incidental Findings Committee II on Vascular Findings. J Am Coll Radiol 2013; 10:789-794. 4. Diffuse aortic atherosclerosis. Diffuse calcification along the superior mesenteric artery, common iliac arteries, external and internal iliac arteries, and common femoral arteries bilaterally. 5. Markedly enlarged prostate.  Would correlate with PSA. 6. Trace bilateral pleural effusions. Bilateral emphysema, with underlying bibasilar fibrotic change. 7. Diffuse coronary artery calcifications seen. 8. Small 3 mm nonobstructing stone at the upper pole of the right kidney. Small hyperdense right renal cyst noted. These results were called by telephone at the time of interpretation on 04/19/2016 at 6:11 am to [facility], who verbally acknowledged these results. Electronically Signed   By: Roanna Raider M.D.   On: 04/19/2016 06:13   Dg Chest 1 View  Result Date: 04/14/2016 CLINICAL DATA:  Dizziness.  Fall. EXAM: CHEST 1 VIEW FINDINGS: Cardiac pacer with lead tips over the right atrium right ventricle. Cardiomegaly. Mild pulmonary vascular prominence with mild interstitial prominence. Mild CHF cannot be excluded. Underlying chronic interstitial lung disease most likely present. Small right pleural effusion cannot be excluded. Left costophrenic angle not imaged. No pneumothorax. No acute bony abnormalities. Degenerative changes both shoulders. IMPRESSION: 1. Cardiac pacer stable position. Cardiomegaly with mild  bilateral from interstitial prominence suggesting mild CHF. Tiny right pleural effusion cannot be excluded. 2. Underlying chronic interstitial lung disease most likely present . Electronically Signed   By: Maisie Fus  Register   On: 04/14/2016 17:00   Dg Knee 1-2 Views Right  Result Date: 04/14/2016 CLINICAL DATA:  Fall. EXAM: RIGHT KNEE - 1-2 VIEW COMPARISON:  04/14/2016. FINDINGS: Soft tissue swelling. Small effusion cannot be excluded. Severe tricompartment degenerative change in osteopenia. Subtle fracture, age undetermined, of the superior aspect of patella cannot be excluded. Bipartite patella could also present in this fashion. Peripheral vascular calcification . IMPRESSION: 1. Soft tissue swelling. Small effusion cannot be excluded. Subtle fracture, age undetermined, superior aspect of the patella. Bipartite patella could also present in this fashion. 2. Diffuse osteopenia and severe degenerative change. 3. Peripheral vascular disease. Electronically Signed   By: Maisie Fus  Register   On: 04/14/2016 17:02   US Aorta  Result Date: 04/15/2016 CLINICAL DATA:  Follow-up of an abdominal aortic aneurysm measuring 5.6 x 5.7 cm seen on CT angiogram of the abdomen pelvis of October 24, 2014 EXAM: ULTRASOUND OF ABDOMINAL AORTA TECHNIQUE: Ultrasound examination of the abdominal aorta was performed to evaluate for abdominal aortic aneurysm. COMPARISON:  CT angiogram of October 24, 2014 FINDINGS: Abdominal Aorta The proximal and mid aorta are obscured by bowel gas. There is an infrarenal abdominal aortic aneurysm measuring 6.7 cm AP x 7.2 cm transversely. There is considerable mural plaque and thrombus. The luminal diameter is approximately 3.5 cm. The aneurysm is 9.2 cm in length. The right common iliac artery measures 1.6 x 1.8 cm. The left common iliac artery measures 2.1 x 1.7 cm. Maximum Diameter: 7.2 cm in the transverse plane. IMPRESSION: Interval growth of an infrarenal abdominal aortic aneurysm which measures  6.7 cm AP x 7.2 cm transversely. It is 9.2 cm in length and extends into the left common iliac artery. The proximal and  mid aorta could not be imaged due to bowel gas and the patient's clinical condition which limited changes in position. These results will be called to the ordering clinician or representative by the Radiologist Assistant, and communication documented in the PACS or zVision Dashboard. Electronically Signed   By: David  [place] M.D.   On: 04/15/2016 13:42   Dg Chest Port 1 View  Result Date: 04/17/2016 CLINICAL DATA:  Acute onset of respiratory distress. Initial encounter. EXAM: PORTABLE CHEST 1 VIEW COMPARISON:  Chest radiograph performed 04/14/2016 FINDINGS: The lungs are well-aerated. Bibasilar airspace opacities raise concern for pneumonia, though mild interstitial edema could have a similar appearance. No definite pleural effusion or pneumothorax is seen. The cardiomediastinal silhouette is mildly enlarged. A pacemaker is noted overlying the left chest wall, with leads ending overlying the right atrium and right ventricle. No acute osseous abnormalities are seen. IMPRESSION: 1. Bibasilar airspace opacities raise concern for pneumonia, though mild interstitial edema could have a similar appearance. 2. Mild cardiomegaly. Electronically Signed   By: Roanna Raider M.D.   On: 04/17/2016 23:46   Dg Knee Right Port  Result Date: 04/22/2016 CLINICAL DATA:  Right knee pain, swelling.  Recent IM nail placement EXAM: PORTABLE RIGHT KNEE - 1-2 VIEW COMPARISON:  04/14/2016 FINDINGS: The distal portion of a right femoral IM nail is visualized. No hardware complicating feature. Advanced degenerative joint disease changes with joint space narrowing and spurring, most pronounced in the patellofemoral compartment. Small joint effusion. No acute fracture, subluxation or dislocation. IMPRESSION: Postoperative changes. Advanced degenerative changes. Note findings. Electronically Signed   By: Charlett Nose M.D.    On: 04/22/2016 09:50   Dg Abd Portable 2v  Result Date: 04/17/2016 CLINICAL DATA:  Abdominal distention. EXAM: PORTABLE ABDOMEN - 2 VIEW COMPARISON:  CT abdomen and pelvis 10/24/2014. FINDINGS: No free intraperitoneal air is identified. There is mild gaseous distention of small and large bowel diffusely. Prominent stool burden ascending colon noted. IMPRESSION: Bowel gas pattern most in keeping with ileus. Large stool burden ascending colon. Electronically Signed   By: Drusilla Kanner M.D.   On: 04/17/2016 12:11   Dg C-arm 1-60 Min  Result Date: 04/19/2016 CLINICAL DATA:  Right femur fracture EXAM: DG C-ARM 61-120 MIN; RIGHT FEMUR 2 VIEWS COMPARISON:  04/14/2016 FINDINGS: Fluoroscopy shows intramedullary nail and dynamic hip screw fixation of an intertrochanteric femur fracture. Fracture alignment is significantly improved. The lesser trochanter fragment remains distracted. No evidence of intraoperative fracture. IMPRESSION: Fluoroscopy for intertrochanteric femur fracture fixation. No unexpected finding. Electronically Signed   By: Marnee Spring M.D.   On: 04/19/2016 13:38   Dg Hip Unilat With Pelvis 2-3 Views Right  Result Date: 05/01/2016 CLINICAL DATA:  Right hip pain, fall EXAM: DG HIP (WITH OR WITHOUT PELVIS) 2-3V RIGHT COMPARISON:  04/14/2016, 04/19/2016 FINDINGS: Extensive vascular calcifications in the pelvis and proximal thighs. Pubic symphysis appears intact. SI joint arthritis. Interval intramedullary rod fixation of previously noted right intertrochanteric fracture. Displaced lesser trochanteric fracture is similar compared to prior. Hardware appears intact. No definite acute fracture is seen. There is soft tissue swelling over the lateral hip. IMPRESSION: 1. Intramedullary rod fixation of the right femur across prior intertrochanteric fracture deformity. No definite acute osseous abnormality. 2. Extensive vascular calcifications. Electronically Signed   By: Jasmine Pang M.D.   On:  05/01/2016 17:39   Dg Hip Unilat W Or Wo Pelvis 2-3 Views Right  Result Date: 04/14/2016 CLINICAL DATA:  Fall today with right hip pain and deformity EXAM: DG  HIP (WITH OR WITHOUT PELVIS) 2-3V RIGHT COMPARISON:  None. FINDINGS: Bones are diffusely demineralized. Comminuted intertrochanteric right femoral neck fracture noted with varus angulation. Lesser trochanter exists as a free fragment. SI joints and symphysis pubis unremarkable. Distal aortic and iliac artery atherosclerosis evident. IMPRESSION: 1. Comminuted intertrochanteric right femoral neck fracture with varus angulation. 2.  Abdominal Aortic Atherosclerois (ICD10-170.0) Electronically Signed   By: Kennith Center M.D.   On: 04/14/2016 17:01   Dg Femur, Min 2 Views Right  Result Date: 04/19/2016 CLINICAL DATA:  Right femur fracture EXAM: DG C-ARM 61-120 MIN; RIGHT FEMUR 2 VIEWS COMPARISON:  04/14/2016 FINDINGS: Fluoroscopy shows intramedullary nail and dynamic hip screw fixation of an intertrochanteric femur fracture. Fracture alignment is significantly improved. The lesser trochanter fragment remains distracted. No evidence of intraoperative fracture. IMPRESSION: Fluoroscopy for intertrochanteric femur fracture fixation. No unexpected finding. Electronically Signed   By: Marnee Spring M.D.   On: 04/19/2016 13:38   Discharge Plan  Swelling of right lower extremity It can be due to his surgery . He is on low dose of Eliquis but will do Dopplers of Leg to rule out DVT.  Closed nondisplaced intertrochanteric fracture of right femur  Doing well. Will be discharged with Home health nurse and Home physical therapy. Continue Fall precautions. Need follow up appointment with his orthopedics.  Acute combined systolic and diastolic heart failure  He Saw Dr [name] yesterday. His Lasix was decreased to 40 mg Qd Entresto was increased to BID. Needs follow up Labs in 1 week and follow up with him.  Aneurysm of abdominal vessel  Patient needs to  follow up with Vascular surgery when recovered from hip surgery. Patient and his wife aware.   Paroxysmal atrial fibrillation (HCC) On Eliquis and Amiodarone Rate controlled.  Hypothyroidism TSH has been elevated  Will increase his Synthroid to 100 mcg Repeat TSH in 4 weeks.  Right Knee effusion Pain better now. Tapered off prednisone. He is on allopurinol. To decide as outpatient if he wants to continue this. Macrocytosis anemia Hgb stable. Repeat CBC and Vit B12   D/W Family  Patient is being discharged with the  home health services:    Patient is being discharged with the Wheel Chair :    Patient has been advised to f/u with their PCP in 1-2 weeks to for a transitions of care visit.  Social services at their facility was responsible for arranging this appointment.  Pt was provided with adequate prescriptions of noncontrolled medications to reach the scheduled appointment .    Total time spent more then 30 min for discharge planning.

## 2016-05-08 ENCOUNTER — Encounter (HOSPITAL_COMMUNITY)
Admission: RE | Admit: 2016-05-08 | Discharge: 2016-05-08 | Disposition: A | Discharge status: Home / Self Care | Payer: Medicare Other | Source: Skilled Nursing Facility | Attending: Internal Medicine | Admitting: Internal Medicine

## 2016-05-08 LAB — BASIC METABOLIC PANEL
ANION GAP: 8 (ref 5–15)
BUN: 39 mg/dL — ABNORMAL HIGH (ref 6–20)
CO2: 23 mmol/L (ref 22–32)
Calcium: 8.3 mg/dL — ABNORMAL LOW (ref 8.9–10.3)
Chloride: 104 mmol/L (ref 101–111)
Creatinine, Ser: 1.57 mg/dL — ABNORMAL HIGH (ref 0.61–1.24)
GFR, EST AFRICAN AMERICAN: 43 mL/min — AB (ref >60)
GFR, EST NON AFRICAN AMERICAN: 37 mL/min — AB (ref >60)
GLUCOSE: 87 mg/dL (ref 65–99)
POTASSIUM: 4.6 mmol/L (ref 3.5–5.1)
SODIUM: 135 mmol/L (ref 135–145)

## 2016-05-08 LAB — CBC WITH DIFFERENTIAL/PLATELET
BASOS ABS: 0 10*3/uL (ref 0.0–0.1)
BASOS PCT: 0 %
EOS PCT: 2 %
Eosinophils Absolute: 0.2 10*3/uL (ref 0.0–0.7)
HCT: 34.1 % — ABNORMAL LOW (ref 39.0–52.0)
Hemoglobin: 11.3 g/dL — ABNORMAL LOW (ref 13.0–17.0)
Lymphocytes Relative: 14 %
Lymphs Abs: 1.4 10*3/uL (ref 0.7–4.0)
MCH: 34.2 pg — ABNORMAL HIGH (ref 26.0–34.0)
MCHC: 33.1 g/dL (ref 30.0–36.0)
MCV: 103.3 fL — AB (ref 78.0–100.0)
MONO ABS: 1.5 10*3/uL — AB (ref 0.1–1.0)
Monocytes Relative: 15 %
Neutro Abs: 6.9 10*3/uL (ref 1.7–7.7)
Neutrophils Relative %: 69 %
PLATELETS: 207 10*3/uL (ref 150–400)
RBC: 3.3 MIL/uL — AB (ref 4.22–5.81)
RDW: 18.1 % — AB (ref 11.5–15.5)
WBC: 10 10*3/uL (ref 4.0–10.5)

## 2016-05-08 LAB — VITAMIN B12: VITAMIN B 12: 279 pg/mL (ref 180–914)

## 2016-05-20 ENCOUNTER — Encounter (HOSPITAL_COMMUNITY)
Admission: AD | Admit: 2016-05-20 | Discharge: 2016-05-20 | Disposition: A | Discharge status: Home / Self Care | Payer: Medicare Other | Source: Skilled Nursing Facility | Attending: Internal Medicine | Admitting: Internal Medicine

## 2016-05-21 ENCOUNTER — Inpatient Hospital Stay (HOSPITAL_COMMUNITY)
Admission: EM | Admit: 2016-05-21 | Discharge: 2016-05-25 | DRG: 291 | Disposition: A | Discharge status: Home Health | Payer: Medicare Other | Attending: Family Medicine | Admitting: Family Medicine

## 2016-05-21 ENCOUNTER — Encounter (HOSPITAL_COMMUNITY): Payer: Self-pay | Admitting: Emergency Medicine

## 2016-05-21 ENCOUNTER — Emergency Department (HOSPITAL_COMMUNITY): Payer: Medicare Other

## 2016-05-21 DIAGNOSIS — J9621 Acute and chronic respiratory failure with hypoxia: Secondary | ICD-10-CM

## 2016-05-21 DIAGNOSIS — M1990 Unspecified osteoarthritis, unspecified site: Secondary | ICD-10-CM | POA: Diagnosis present

## 2016-05-21 DIAGNOSIS — Z7901 Long term (current) use of anticoagulants: Secondary | ICD-10-CM

## 2016-05-21 DIAGNOSIS — Z87891 Personal history of nicotine dependence: Secondary | ICD-10-CM | POA: Diagnosis not present

## 2016-05-21 DIAGNOSIS — W1830XD Fall on same level, unspecified, subsequent encounter: Secondary | ICD-10-CM

## 2016-05-21 DIAGNOSIS — E785 Hyperlipidemia, unspecified: Secondary | ICD-10-CM | POA: Diagnosis present

## 2016-05-21 DIAGNOSIS — I5021 Acute systolic (congestive) heart failure: Secondary | ICD-10-CM

## 2016-05-21 DIAGNOSIS — N179 Acute kidney failure, unspecified: Secondary | ICD-10-CM | POA: Diagnosis present

## 2016-05-21 DIAGNOSIS — I255 Ischemic cardiomyopathy: Secondary | ICD-10-CM | POA: Diagnosis present

## 2016-05-21 DIAGNOSIS — I2510 Atherosclerotic heart disease of native coronary artery without angina pectoris: Secondary | ICD-10-CM | POA: Diagnosis present

## 2016-05-21 DIAGNOSIS — I480 Paroxysmal atrial fibrillation: Secondary | ICD-10-CM | POA: Diagnosis present

## 2016-05-21 DIAGNOSIS — I5023 Acute on chronic systolic (congestive) heart failure: Secondary | ICD-10-CM

## 2016-05-21 DIAGNOSIS — S72141A Displaced intertrochanteric fracture of right femur, initial encounter for closed fracture: Secondary | ICD-10-CM | POA: Diagnosis present

## 2016-05-21 DIAGNOSIS — R0600 Dyspnea, unspecified: Secondary | ICD-10-CM | POA: Diagnosis not present

## 2016-05-21 DIAGNOSIS — R0603 Acute respiratory distress: Secondary | ICD-10-CM

## 2016-05-21 DIAGNOSIS — S72143D Displaced intertrochanteric fracture of unspecified femur, subsequent encounter for closed fracture with routine healing: Secondary | ICD-10-CM

## 2016-05-21 DIAGNOSIS — J9601 Acute respiratory failure with hypoxia: Secondary | ICD-10-CM

## 2016-05-21 DIAGNOSIS — N183 Chronic kidney disease, stage 3 unspecified: Secondary | ICD-10-CM

## 2016-05-21 DIAGNOSIS — I428 Other cardiomyopathies: Secondary | ICD-10-CM

## 2016-05-21 DIAGNOSIS — I4510 Unspecified right bundle-branch block: Secondary | ICD-10-CM | POA: Diagnosis present

## 2016-05-21 DIAGNOSIS — L8990 Pressure ulcer of unspecified site, unspecified stage: Secondary | ICD-10-CM | POA: Insufficient documentation

## 2016-05-21 DIAGNOSIS — L89502 Pressure ulcer of unspecified ankle, stage 2: Secondary | ICD-10-CM | POA: Diagnosis present

## 2016-05-21 DIAGNOSIS — I714 Abdominal aortic aneurysm, without rupture, unspecified: Secondary | ICD-10-CM

## 2016-05-21 DIAGNOSIS — Z91041 Radiographic dye allergy status: Secondary | ICD-10-CM

## 2016-05-21 DIAGNOSIS — R0602 Shortness of breath: Secondary | ICD-10-CM

## 2016-05-21 DIAGNOSIS — Z950 Presence of cardiac pacemaker: Secondary | ICD-10-CM | POA: Diagnosis not present

## 2016-05-21 DIAGNOSIS — R0902 Hypoxemia: Secondary | ICD-10-CM

## 2016-05-21 DIAGNOSIS — L89302 Pressure ulcer of unspecified buttock, stage 2: Secondary | ICD-10-CM | POA: Diagnosis present

## 2016-05-21 DIAGNOSIS — Z955 Presence of coronary angioplasty implant and graft: Secondary | ICD-10-CM

## 2016-05-21 DIAGNOSIS — Z79899 Other long term (current) drug therapy: Secondary | ICD-10-CM | POA: Diagnosis not present

## 2016-05-21 DIAGNOSIS — I5041 Acute combined systolic (congestive) and diastolic (congestive) heart failure: Secondary | ICD-10-CM

## 2016-05-21 DIAGNOSIS — I130 Hypertensive heart and chronic kidney disease with heart failure and stage 1 through stage 4 chronic kidney disease, or unspecified chronic kidney disease: Secondary | ICD-10-CM | POA: Diagnosis not present

## 2016-05-21 DIAGNOSIS — Z8052 Family history of malignant neoplasm of bladder: Secondary | ICD-10-CM

## 2016-05-21 DIAGNOSIS — S72141B Displaced intertrochanteric fracture of right femur, initial encounter for open fracture type I or II: Secondary | ICD-10-CM

## 2016-05-21 DIAGNOSIS — E039 Hypothyroidism, unspecified: Secondary | ICD-10-CM | POA: Diagnosis present

## 2016-05-21 DIAGNOSIS — I25718 Atherosclerosis of autologous vein coronary artery bypass graft(s) with other forms of angina pectoris: Secondary | ICD-10-CM | POA: Diagnosis not present

## 2016-05-21 DIAGNOSIS — I4891 Unspecified atrial fibrillation: Secondary | ICD-10-CM | POA: Diagnosis not present

## 2016-05-21 LAB — COMPREHENSIVE METABOLIC PANEL
ALBUMIN: 3.1 g/dL — AB (ref 3.5–5.0)
ALK PHOS: 101 U/L (ref 38–126)
ALT: 9 U/L — AB (ref 17–63)
AST: 20 U/L (ref 15–41)
Anion gap: 10 (ref 5–15)
BILIRUBIN TOTAL: 0.8 mg/dL (ref 0.3–1.2)
BUN: 48 mg/dL — ABNORMAL HIGH (ref 6–20)
CALCIUM: 9.1 mg/dL (ref 8.9–10.3)
CO2: 25 mmol/L (ref 22–32)
CREATININE: 1.78 mg/dL — AB (ref 0.61–1.24)
Chloride: 100 mmol/L — ABNORMAL LOW (ref 101–111)
GFR calc non Af Amer: 32 mL/min — ABNORMAL LOW (ref >60)
GFR, EST AFRICAN AMERICAN: 37 mL/min — AB (ref >60)
Glucose, Bld: 96 mg/dL (ref 65–99)
Potassium: 5 mmol/L (ref 3.5–5.1)
Sodium: 135 mmol/L (ref 135–145)
Total Protein: 6.4 g/dL — ABNORMAL LOW (ref 6.5–8.1)

## 2016-05-21 LAB — CBC
HEMATOCRIT: 37.1 % — AB (ref 39.0–52.0)
HEMOGLOBIN: 12.5 g/dL — AB (ref 13.0–17.0)
MCH: 34.6 pg — AB (ref 26.0–34.0)
MCHC: 33.7 g/dL (ref 30.0–36.0)
MCV: 102.8 fL — ABNORMAL HIGH (ref 78.0–100.0)
Platelets: 330 10*3/uL (ref 150–400)
RBC: 3.61 MIL/uL — AB (ref 4.22–5.81)
RDW: 17.9 % — ABNORMAL HIGH (ref 11.5–15.5)
WBC: 10.3 10*3/uL (ref 4.0–10.5)

## 2016-05-21 LAB — URINALYSIS, COMPLETE (UACMP) WITH MICROSCOPIC
BACTERIA UA: NONE SEEN
Bilirubin Urine: NEGATIVE
Glucose, UA: NEGATIVE mg/dL
HGB URINE DIPSTICK: NEGATIVE
Ketones, ur: NEGATIVE mg/dL
Leukocytes, UA: NEGATIVE
NITRITE: NEGATIVE
Protein, ur: 30 mg/dL — AB
SPECIFIC GRAVITY, URINE: 1.013 (ref 1.005–1.030)
pH: 5 (ref 5.0–8.0)

## 2016-05-21 LAB — TROPONIN I: Troponin I: <0.03 ng/mL (ref <0.03)

## 2016-05-21 LAB — BRAIN NATRIURETIC PEPTIDE: B NATRIURETIC PEPTIDE 5: 962 pg/mL — AB (ref 0.0–100.0)

## 2016-05-21 LAB — I-STAT CG4 LACTIC ACID, ED
LACTIC ACID, VENOUS: 1.64 mmol/L (ref 0.5–1.9)
Lactic Acid, Venous: 2.1 mmol/L (ref 0.5–1.9)

## 2016-05-21 MED ORDER — ADULT MULTIVITAMIN W/MINERALS CH
1.0000 | ORAL_TABLET | Freq: Every day | ORAL | Status: DC
  Administered 2016-05-21 – 2016-05-25 (×5): 1 via ORAL
  Filled 2016-05-21 (×5): qty 1

## 2016-05-21 MED ORDER — ONDANSETRON HCL 4 MG/2ML IJ SOLN: 4.0000 mg | Freq: Four times a day (QID) | INTRAMUSCULAR | Status: DC | PRN

## 2016-05-21 MED ORDER — ALBUTEROL (5 MG/ML) CONTINUOUS INHALATION SOLN
10.0000 mg/h | INHALATION_SOLUTION | Freq: Once | RESPIRATORY_TRACT | Status: AC
  Administered 2016-05-21: 10 mg/h via RESPIRATORY_TRACT
  Filled 2016-05-21: qty 20

## 2016-05-21 MED ORDER — OMEGA-3-ACID ETHYL ESTERS 1 G PO CAPS
1.0000 g | ORAL_CAPSULE | Freq: Every morning | ORAL | Status: DC
  Administered 2016-05-22 – 2016-05-25 (×3): 1 g via ORAL
  Filled 2016-05-21 (×3): qty 1

## 2016-05-21 MED ORDER — SODIUM CHLORIDE 0.9% FLUSH: 3.0000 mL | INTRAVENOUS | Status: DC | PRN

## 2016-05-21 MED ORDER — FUROSEMIDE 10 MG/ML IJ SOLN
40.0000 mg | Freq: Two times a day (BID) | INTRAMUSCULAR | Status: DC
  Administered 2016-05-22 – 2016-05-24 (×5): 40 mg via INTRAVENOUS
  Filled 2016-05-21 (×6): qty 4

## 2016-05-21 MED ORDER — SENNOSIDES-DOCUSATE SODIUM 8.6-50 MG PO TABS
1.0000 | ORAL_TABLET | Freq: Two times a day (BID) | ORAL | Status: DC
  Administered 2016-05-21 – 2016-05-25 (×6): 1 via ORAL
  Filled 2016-05-21 (×7): qty 1

## 2016-05-21 MED ORDER — CARVEDILOL 3.125 MG PO TABS
3.1250 mg | ORAL_TABLET | Freq: Two times a day (BID) | ORAL | Status: DC
  Administered 2016-05-21 – 2016-05-25 (×8): 3.125 mg via ORAL
  Filled 2016-05-21 (×8): qty 1

## 2016-05-21 MED ORDER — POLYETHYLENE GLYCOL 3350 17 G PO PACK: 17.0000 g | PACK | Freq: Every day | ORAL | Status: DC | PRN

## 2016-05-21 MED ORDER — SODIUM CHLORIDE 0.9% FLUSH
3.0000 mL | Freq: Two times a day (BID) | INTRAVENOUS | Status: DC
  Administered 2016-05-21 – 2016-05-25 (×8): 3 mL via INTRAVENOUS

## 2016-05-21 MED ORDER — FINASTERIDE 5 MG PO TABS
ORAL_TABLET | ORAL | Status: AC
  Filled 2016-05-21: qty 1

## 2016-05-21 MED ORDER — LEVOTHYROXINE SODIUM 75 MCG PO TABS
75.0000 ug | ORAL_TABLET | Freq: Every day | ORAL | Status: DC
  Administered 2016-05-22 – 2016-05-25 (×4): 75 ug via ORAL
  Filled 2016-05-21 (×4): qty 1

## 2016-05-21 MED ORDER — NITROGLYCERIN 0.4 MG SL SUBL: 0.4000 mg | SUBLINGUAL_TABLET | SUBLINGUAL | Status: DC | PRN

## 2016-05-21 MED ORDER — ACETAMINOPHEN 325 MG PO TABS
650.0000 mg | ORAL_TABLET | ORAL | Status: DC | PRN
  Administered 2016-05-22: 650 mg via ORAL
  Filled 2016-05-21: qty 2

## 2016-05-21 MED ORDER — SODIUM CHLORIDE 0.9 % IV SOLN: 250.0000 mL | INTRAVENOUS | Status: DC | PRN

## 2016-05-21 MED ORDER — APIXABAN 2.5 MG PO TABS
2.5000 mg | ORAL_TABLET | Freq: Two times a day (BID) | ORAL | Status: DC
  Administered 2016-05-21 – 2016-05-25 (×8): 2.5 mg via ORAL
  Filled 2016-05-21 (×8): qty 1

## 2016-05-21 MED ORDER — FINASTERIDE 5 MG PO TABS
5.0000 mg | ORAL_TABLET | Freq: Every evening | ORAL | Status: DC
  Administered 2016-05-21 – 2016-05-24 (×4): 5 mg via ORAL
  Filled 2016-05-21 (×6): qty 1

## 2016-05-21 MED ORDER — ATORVASTATIN CALCIUM 40 MG PO TABS
40.0000 mg | ORAL_TABLET | Freq: Every day | ORAL | Status: DC
  Administered 2016-05-21 – 2016-05-24 (×4): 40 mg via ORAL
  Filled 2016-05-21 (×4): qty 1

## 2016-05-21 MED ORDER — AMIODARONE HCL 200 MG PO TABS
100.0000 mg | ORAL_TABLET | Freq: Every day | ORAL | Status: DC
  Administered 2016-05-21 – 2016-05-25 (×5): 100 mg via ORAL
  Filled 2016-05-21 (×5): qty 1

## 2016-05-21 MED ORDER — FUROSEMIDE 10 MG/ML IJ SOLN
40.0000 mg | INTRAMUSCULAR | Status: AC
  Administered 2016-05-21: 40 mg via INTRAVENOUS
  Filled 2016-05-21: qty 4

## 2016-05-21 MED ORDER — FAMOTIDINE 20 MG PO TABS
20.0000 mg | ORAL_TABLET | Freq: Every evening | ORAL | Status: DC
  Administered 2016-05-21 – 2016-05-24 (×4): 20 mg via ORAL
  Filled 2016-05-21 (×4): qty 1

## 2016-05-21 NOTE — ED Notes (Addendum)
Pt has 2+ pitting to right lower leg and foot, pulses present, skin cool to touch. Pt also has bruising to right leg.

## 2016-05-21 NOTE — ED Notes (Signed)
MD at bedside. 

## 2016-05-21 NOTE — ED Notes (Addendum)
Pt c/o pain when urinating.  Checked condom cath and it was at head of penis, condom cath removed and urinal placed for pt to urinate in. Pt alert and oriented at this time. Dr. Hyacinth Meeker notified that pt was c/o this issue and would like to be evaluated for this.

## 2016-05-21 NOTE — H&P (Signed)
History and Physical    Connor Diaz ZOX:[id number] DOB: January 22, 1924 DOA: 05/21/2016  Referring MD/NP/PA: Eber Hong, EDP PCP: [name], MD  Patient coming from: Home  Chief Complaint: blue fingers and lips, confusion  HPI: Connor Diaz is a 83 y.o. male with h/o systolic CHF with known EF of 81-19%, recent right hip fracture and repair, CAD, CKD Stage III, HTN, HLD, Hypothyroidism, PAF anticoagulated on eliquis and a PPM for symptomatic bradycardia, is sent to the ED by the [facility] therapist due to concerns of extremity and peribuccal cyanosis with O2 sats in the 70s. He denies being SOB or having CP, does admit to being more fatigued than usual since being released from rehab approx 2 weeks ago. Labs show a Cr of 1.78 (baseline around 1.4-1.5), BNP of 962, initial lactic acid of 2.10, CXR with interstitial edema and no infiltrate. He has given 40 mg IV lasix with prompt diuresis and symptom improvement. Admission is requested for acute CHF.  Past Medical/Surgical History: Past Medical History:  Diagnosis Date  . AAA (abdominal aortic aneurysm) (HCC)    a. 02/2009 U/S 4.4 x 4.5 cm  . BPH (benign prostatic hyperplasia)   . CKD (chronic kidney disease), stage III   . Coronary artery disease   . DJD (degenerative joint disease)   . History of tobacco abuse    a. roughly 36 pack years, quit @ age 17.  Marland Kitchen Hypertension   . Hypothyroidism   . Non-ischemic cardiomyopathy (HCC)   . PAF (paroxysmal atrial fibrillation) (HCC)    a. noted 11/2011  . Pancreatitis    a. 10/2011 - managed conservatively @ home.  . Symptomatic bradycardia    a. s/p STJ dual chamber PPM   . Systolic heart failure (HCC)    02/2013    Past Surgical History:  Procedure Laterality Date  . CATARACT EXTRACTION W/ INTRAOCULAR LENS  IMPLANT, BILATERAL Bilateral 1980's  . CORONARY ANGIOGRAM  11/19/2011   Procedure: CORONARY ANGIOGRAM;  Surgeon: [name], MD;  Location: [facility] CATH LAB;  Service:  Cardiovascular;;  . CORONARY ANGIOPLASTY WITH STENT PLACEMENT  12/22/2011    LAD & CIRCUMFLEX  . EYE SURGERY    . INTRAMEDULLARY (IM) NAIL INTERTROCHANTERIC Right 04/19/2016   Procedure: INTRAMEDULLARY (IM) NAIL INTERTROCHANTRIC;  Surgeon: Sheral Apley, MD;  Location: [facility] OR;  Service: Orthopedics;  Laterality: Right;  . PERCUTANEOUS CORONARY STENT INTERVENTION (PCI-S) N/A 12/22/2011   Procedure: PERCUTANEOUS CORONARY STENT INTERVENTION (PCI-S);  Surgeon: [name], MD;  Location: [facility] CATH LAB;  Service: Cardiovascular;  Laterality: N/A;  . PERMANENT PACEMAKER INSERTION N/A 11/20/2011   STJ dual chamber PPM implanted by [name] for symptomatic bradycardia   . TONSILLECTOMY  ~ 20    Social History:  reports that he quit smoking about 35 years ago. His smoking use included Cigarettes. He has a 35.00 pack-year smoking history. He has never used smokeless tobacco. He reports that he does not drink alcohol or use drugs.  Allergies: Allergies  Allergen Reactions  . Iodinated Diagnostic Agents Rash and Swelling    Swelling in face, urticaria. Received Isovoc 370 (non-ionic) on 10/24/14.  . Isovue [Iopamidol] Itching and Swelling    Pt had slight erythema, itching and facial swelling right ear and right lower chin.  Pt sneezed several times after contrast injection.  Pt was monitored for 1 hr post injection and was given water to drink. Dr Gery Pray feels he had a very mild reaction.  Premeds are warranted in  this patient.  Thanks, Gildardo Griffes    Family History:  Family History  Problem Relation Age of Onset  . Other Father     Accidental death @ age 32 - box fell on him at work  . Cancer Mother     died @ 64 Bladder cancer    Prior to Admission medications   Medication Sig Start Date End Date Taking? Authorizing Provider  acetaminophen (TYLENOL) 500 MG tablet Take 500 mg by mouth daily.   Yes Historical Provider, MD  allopurinol (ZYLOPRIM) 100 MG tablet Take 1 tablet (100 mg total) by  mouth daily. 04/23/16  Yes Ripudeep Jenna Luo, MD  amiodarone (PACERONE) 200 MG tablet Take 100 mg by mouth daily.   Yes Historical Provider, MD  apixaban (ELIQUIS) 2.5 MG TABS tablet Take 2.5 mg by mouth 2 (two) times daily.   Yes Historical Provider, MD  atorvastatin (LIPITOR) 80 MG tablet Take 40 mg by mouth daily at 6 PM. 11/23/11  Yes Rinaldo Cloud, MD  carvedilol (COREG) 3.125 MG tablet Take 1 tablet (3.125 mg total) by mouth 2 (two) times daily with a meal. 04/23/16  Yes Ripudeep Jenna Luo, MD  cetirizine (ZYRTEC ALLERGY) 10 MG tablet Take 10 mg by mouth daily as needed for allergies or rhinitis.    Yes Historical Provider, MD  famotidine (PEPCID) 20 MG tablet Take 20 mg by mouth every evening.  12/23/11  Yes Rinaldo Cloud, MD  finasteride (PROSCAR) 5 MG tablet Take 5 mg by mouth every evening.  02/08/12  Yes Historical Provider, MD  fish oil-omega-3 fatty acids 1000 MG capsule Take 1 g by mouth every morning.    Yes Historical Provider, MD  furosemide (LASIX) 20 MG tablet Take 40 mg by mouth.    Yes Historical Provider, MD  HYDROcodone-acetaminophen (NORCO/VICODIN) 5-325 MG tablet Take 1 tablet by mouth every 6 (six) hours as needed for severe pain. 04/24/16  Yes Tiffany L Reed, DO  ipratropium-albuterol (DUONEB) 0.5-2.5 (3) MG/3ML SOLN Take 3 mLs by nebulization 3 (three) times daily. 04/23/16  Yes Ripudeep Jenna Luo, MD  levothyroxine (SYNTHROID, LEVOTHROID) 75 MCG tablet Take 75 mcg by mouth daily before breakfast. 04/30/14  Yes Historical Provider, MD  linaclotide (LINZESS) 72 MCG capsule Take 72 mcg by mouth daily as needed (for constipation).   Yes Historical Provider, MD  Menthol, Topical Analgesic, (BIOFREEZE) 4 % GEL Apply to right knee for discomfort three times a day prn   Yes Historical Provider, MD  Menthol, Topical Analgesic, (BLUE-EMU MAXIMUM STRENGTH EX) Apply 1 application topically daily. Applied to knees   Yes Historical Provider, MD  Multiple Vitamin (MULTIVITAMIN WITH MINERALS) TABS tablet Take  1 tablet by mouth daily. 04/23/16  Yes Ripudeep Jenna Luo, MD  nitroGLYCERIN (NITROSTAT) 0.4 MG SL tablet Place 1 tablet (0.4 mg total) under the tongue every 5 (five) minutes x 3 doses as needed for chest pain. 11/23/11  Yes Rinaldo Cloud, MD  polyethylene glycol powder (GLYCOLAX/MIRALAX) powder Take 17 g by mouth daily as needed for moderate constipation.  10/27/11  Yes Historical Provider, MD  sacubitril-valsartan (ENTRESTO) 24-26 MG Take 1 tablet by mouth 2 (two) times daily.   Yes Historical Provider, MD  senna-docusate (SENOKOT-S) 8.6-50 MG tablet Take 1 tablet by mouth 2 (two) times daily. 04/23/16  Yes Ripudeep Jenna Luo, MD    Review of Systems:  Constitutional: Denies fever, chills, diaphoresis, appetite change. HEENT: Denies photophobia, eye pain, redness, hearing loss, ear pain, congestion, sore throat, rhinorrhea, sneezing, mouth sores,  trouble swallowing, neck pain, neck stiffness and tinnitus.   Respiratory: Denies SOB, DOE, cough, chest tightness,  and wheezing.   Cardiovascular: Denies chest pain, palpitations and leg swelling.  Gastrointestinal: Denies nausea, vomiting, abdominal pain, diarrhea, constipation, blood in stool and abdominal distention.  Genitourinary: Denies dysuria, urgency, frequency, hematuria, flank pain and difficulty urinating.  Endocrine: Denies: hot or cold intolerance, sweats, changes in hair or nails, polyuria, polydipsia. Musculoskeletal: Denies myalgias, back pain, joint swelling, arthralgias and gait problem.  Skin: Denies pallor, rash and wound.  Neurological: Denies dizziness, seizures, syncope, weakness, light-headedness, numbness and headaches.  Hematological: Denies adenopathy. Easy bruising, personal or family bleeding history  Psychiatric/Behavioral: Denies suicidal ideation, mood changes, confusion, nervousness, sleep disturbance and agitation    Physical Exam: Vitals:   05/21/16 1600 05/21/16 1630 05/21/16 1655 05/21/16 1710  BP: 114/82 90/63 102/78  (!) 108/59  Pulse: 99 74 89 81  Resp: (!) 21 (!) 22 (!) 23 (!) 22  Temp:   98.4 F (36.9 C) 98.5 F (36.9 C)  TempSrc:   Oral   SpO2: 94% 95% 95% 96%  Weight:    76.5 kg (168 lb 10.4 oz)  Height:    5\' 10"  (1.778 m)     Constitutional: NAD, calm, comfortable Eyes: PERRL, lids and conjunctivae normal ENMT: Mucous membranes are moist. Posterior pharynx clear of any exudate or lesions.Normal dentition.  Neck: normal, supple, no masses, no thyromegaly Respiratory: clear to auscultation bilaterally, no wheezing, mild bibasilar crackles. Normal respiratory effort. No accessory muscle use.  Cardiovascular: Regular rate and rhythm, no murmurs / rubs / gallops. 2+ lower extremity edema. 2+ pedal pulses. No carotid bruits.  Abdomen: no tenderness, no masses palpated. No hepatosplenomegaly. Bowel sounds positive.  Musculoskeletal: no clubbing / cyanosis. No joint deformity upper and lower extremities. Good ROM, no contractures. Normal muscle tone.  Skin: no rashes, lesions, ulcers. No induration, large bruise over inner right thigh and calf from recent hip fracture repair. Neurologic: CN 2-12 grossly intact. Sensation intact, DTR normal. Strength 5/5 in all 4.  Psychiatric: Normal judgment and insight. Alert and oriented x 3. Normal mood.    Labs on Admission: I have personally reviewed the following labs and imaging studies  CBC:  Recent Labs Lab 05/21/16 1145  WBC 10.3  HGB 12.5*  HCT 37.1*  MCV 102.8*  PLT 330   Basic Metabolic Panel:  Recent Labs Lab 05/21/16 1145  NA 135  K 5.0  CL 100*  CO2 25  GLUCOSE 96  BUN 48*  CREATININE 1.78*  CALCIUM 9.1   GFR: Estimated Creatinine Clearance: 28.5 mL/min (A) (by C-G formula based on SCr of 1.78 mg/dL (H)). Liver Function Tests:  Recent Labs Lab 05/21/16 1145  AST 20  ALT 9*  ALKPHOS 101  BILITOT 0.8  PROT 6.4*  ALBUMIN 3.1*   No results for input(s): LIPASE, AMYLASE in the last 168 hours. No results for input(s):  AMMONIA in the last 168 hours. Coagulation Profile: No results for input(s): INR, PROTIME in the last 168 hours. Cardiac Enzymes:  Recent Labs Lab 05/21/16 1145  TROPONINI <0.03   BNP (last 3 results) No results for input(s): PROBNP in the last 8760 hours. HbA1C: No results for input(s): HGBA1C in the last 72 hours. CBG: No results for input(s): GLUCAP in the last 168 hours. Lipid Profile: No results for input(s): CHOL, HDL, LDLCALC, TRIG, CHOLHDL, LDLDIRECT in the last 72 hours. Thyroid Function Tests: No results for input(s): TSH, T4TOTAL, FREET4, T3FREE, THYROIDAB in  the last 72 hours. Anemia Panel: No results for input(s): VITAMINB12, FOLATE, FERRITIN, TIBC, IRON, RETICCTPCT in the last 72 hours. Urine analysis:    Component Value Date/Time   COLORURINE YELLOW 05/21/2016 1213   APPEARANCEUR CLEAR 05/21/2016 1213   LABSPEC 1.013 05/21/2016 1213   PHURINE 5.0 05/21/2016 1213   GLUCOSEU NEGATIVE 05/21/2016 1213   HGBUR NEGATIVE 05/21/2016 1213   BILIRUBINUR NEGATIVE 05/21/2016 1213   KETONESUR NEGATIVE 05/21/2016 1213   PROTEINUR 30 (A) 05/21/2016 1213   NITRITE NEGATIVE 05/21/2016 1213   LEUKOCYTESUR NEGATIVE 05/21/2016 1213   Sepsis Labs: @LABRCNTIP (procalcitonin:4,lacticidven:4) ) Recent Results (from the past 240 hour(s))  Blood Culture (routine x 2)     Status: None (Preliminary result)   Collection Time: 05/21/16 12:17 PM  Result Value Ref Range Status   Specimen Description BLOOD LEFT ARM  Final   Special Requests   Final    BOTTLES DRAWN AEROBIC AND ANAEROBIC Blood Culture adequate volume   Culture PENDING  Incomplete   Report Status PENDING  Incomplete  Blood Culture (routine x 2)     Status: None (Preliminary result)   Collection Time: 05/21/16 12:20 PM  Result Value Ref Range Status   Specimen Description BLOOD RIGHT HAND  Final   Special Requests   Final    BOTTLES DRAWN AEROBIC ONLY Blood Culture results may not be optimal due to an inadequate  volume of blood received in culture bottles   Culture PENDING  Incomplete   Report Status PENDING  Incomplete     Radiological Exams on Admission: US Venous Img Lower Unilateral Right  Result Date: 05/21/2016 CLINICAL DATA:  Right lower extremity edema.  Recent hip fracture EXAM: RIGHT LOWER EXTREMITY VENOUS DUPLEX ULTRASOUND TECHNIQUE: Gray-scale sonography with graded compression, as well as color Doppler and duplex ultrasound were performed to evaluate the right lower extremity deep venous system from the level of the common femoral vein and including the common femoral, femoral, profunda femoral, popliteal and calf veins including the posterior tibial, peroneal and gastrocnemius veins when visible. The superficial great saphenous vein was also interrogated. Spectral Doppler was utilized to evaluate flow at rest and with distal augmentation maneuvers in the common femoral, femoral and popliteal veins. COMPARISON:  None. FINDINGS: Contralateral Common Femoral Vein: Respiratory phasicity is normal and symmetric with the symptomatic side. No evidence of thrombus. Normal compressibility. Common Femoral Vein: No evidence of thrombus. Normal compressibility, respiratory phasicity and response to augmentation. Saphenofemoral Junction: No evidence of thrombus. Normal compressibility and flow on color Doppler imaging. Profunda Femoral Vein: No evidence of thrombus. Normal compressibility and flow on color Doppler imaging. Femoral Vein: No evidence of thrombus. Normal compressibility, respiratory phasicity and response to augmentation. Popliteal Vein: No evidence of thrombus. Normal compressibility, respiratory phasicity and response to augmentation. Calf Veins: No evidence of thrombus. Normal compressibility and flow on color Doppler imaging. Superficial Great Saphenous Vein: No evidence of thrombus. Normal compressibility and flow on color Doppler imaging. Venous Reflux:  None. Other Findings: There is a multi  septated popliteal cyst in the popliteal fossa measuring 4.2 x 1.9 x 3.0 cm. IMPRESSION: No evidence of right lower extremity deep venous thrombosis. Left common femoral vein also patent. Complex Baker cyst in the right popliteal fossa measuring 4.2 x 1.9 x 3.0 cm. Electronically Signed   By: Bretta Bang III M.D.   On: 05/21/2016 13:08   Dg Chest Portable 1 View  Result Date: 05/21/2016 CLINICAL DATA:  Shortness of breath for 3 days, former smoker EXAM:  PORTABLE CHEST 1 VIEW COMPARISON:  04/17/2016 FINDINGS: Cardiomediastinal silhouette is stable. Mild reticular interstitial prominence bilaterally. Mild interstitial edema cannot be excluded. Stable streaky bilateral basilar atelectasis or scarring. No segmental infiltrate. Double lead cardiac pacemaker is unchanged in position. IMPRESSION: Reticular mild interstitial prominence bilateral suspicious for mild interstitial edema. Stable streaky bilateral basilar atelectasis or scarring. No segmental infiltrate. Electronically Signed   By: Natasha Mead M.D.   On: 05/21/2016 12:10    EKG: Independently reviewed. A fib at a rate of 84, RBBB  Assessment/Plan Principal Problem:   Acute on chronic respiratory failure with hypoxia (HCC) Active Problems:   Acute systolic CHF (congestive heart failure) (HCC)   Pacemaker-St. Jude   CAD (coronary artery disease)   Non-ischemic cardiomyopathy (HCC)   Paroxysmal atrial fibrillation (HCC)   Intertrochanteric fracture of right hip (HCC)   ARF (acute renal failure) (HCC)    Acute hypoxemic respiratory Failure -Believe due to acute systolic CHF. -However, given recent orthopedic surgery, need to consider PE as a potential diagnosis. -LE dopplers without evidence for DVT, has allergy to IV dye and his renal function precludes IV dye use anyways. Will request VQ scan to check for PE. -Continue oxygen as needed to keep sats above 90%.  Acute on chronic systolic CHF -ECHO from 03/2016 with EF of  25-30%. -Signs of volume overload with crackles on lung auscultation and lower extremity edema. -Start lasix 40 mg IV BID, strict Is and Os, daily weights, strive for negative fluid balance. -Continue BB, hold entresto given worsening renal function.  Acute on CKD Stage III -Hold entresto. -Monitor renal function with diuresis.  PAF -Rate controlled. -On Eliquis.  HLD -Continue statin.  HTN -BP low normal. -Continue coreg, holding entresto. -Follow.   Dr. Janna Arch to assume care in am.  DVT prophylaxis: Eliquis  Code Status: full code  Family Communication: D/w multiple family members at bedside inclusing wife.  Disposition Plan: to be determined pending clinical course  Consults called: cardiology  Admission status: inpatient    Time Spent: 85 minutes  Chaya Jan MD [facility] Pager [phone]  If 7PM-7AM, please contact night-coverage [email or web address] Password [email or web address]  05/21/2016, 5:40 PM

## 2016-05-21 NOTE — ED Notes (Signed)
Lisa Updated on recent hypotension, 102/78.

## 2016-05-21 NOTE — ED Notes (Signed)
EKG given to Dr. Hyacinth Meeker by Lennon Alstrom cruise,rn

## 2016-05-21 NOTE — ED Notes (Signed)
Notified respiratory that tx was due.

## 2016-05-21 NOTE — ED Provider Notes (Signed)
[facility]-EM[facility]Y DEPT Provider Note   CSN: [id number] Arrival date & time: 05/21/16  1118   By signing my name below, I, Bobbie Stack, attest that this documentation has been prepared under the direction and in the presence of Eber Hong, MD. Electronically Signed: Bobbie Stack, Scribe. 05/21/16. Marland Kitchen History   Chief Complaint Chief Complaint  Patient presents with  . Shortness of Breath    The history is provided by the patient and a relative.  HPI Comments: Connor Diaz is a 83 y.o. male brought in by ambulance, who presents to the Emergency Department complaining of SOB since yesterday. Upon arrival the patient had O2 sats in the 70s. He was given breathing treatments and was placed on nasal canula with an increase of O2 sats to 97%. Patient states that he had a fall around 5 weeks ago. He had hip surgery at that time and was hospitalized. The patient was then sent to the [facility]. The patient was recently discharged home and shortly after returning home he began to become short of breath. He also reports some pain in his hip down to his knee currently. He denies known hx of PE and any lung diseases. He states that he used to smoke over 30 years ago but is not a current smoker. He denies fever, nausea, vomiting, and any chest pain.  Echo reviewed from 2/18 - showed EF of 25% with non ischemic cardiomyopathy  Past Medical History:  Diagnosis Date  . AAA (abdominal aortic aneurysm) (HCC)    a. 02/2009 U/S 4.4 x 4.5 cm  . BPH (benign prostatic hyperplasia)   . CKD (chronic kidney disease), stage III   . Coronary artery disease   . DJD (degenerative joint disease)   . History of tobacco abuse    a. roughly 36 pack years, quit @ age 40.  Marland Kitchen Hypertension   . Hypothyroidism   . Non-ischemic cardiomyopathy (HCC)   . PAF (paroxysmal atrial fibrillation) (HCC)    a. noted 11/2011  . Pancreatitis    a. 10/2011 - managed conservatively @ home.  . Symptomatic bradycardia     a. s/p STJ dual chamber PPM   . Systolic heart failure (HCC)    02/2013    Patient Active Problem List   Diagnosis Date Noted  . Acute systolic CHF (congestive heart failure) (HCC) 05/21/2016  . Intertrochanteric fracture of right hip (HCC) 04/19/2016  . Malnutrition of moderate degree 04/17/2016  . Diarrhea 04/14/2016  . CKD (chronic kidney disease) stage 3, GFR 30-59 ml/min 04/14/2016  . Paroxysmal atrial fibrillation (HCC) 09/27/2015  . Non-ischemic cardiomyopathy (HCC)   . Respiratory failure with hypoxia (HCC) 02/24/2013  . Bronchopneumonia 02/22/2013  . SOB (shortness of breath) 02/22/2013  . Acute combined systolic and diastolic heart failure (HCC) 02/22/2013  . CAP (community acquired pneumonia) 02/22/2013  . Cellulitis of left lower extremity 02/22/2013  . Aneurysm of abdominal vessel (HCC) 03/07/2012  . Bradycardia 03/04/2012  . CAD (coronary artery disease) 03/04/2012  . Pacemaker-St. Jude 11/23/2011    Past Surgical History:  Procedure Laterality Date  . CATARACT EXTRACTION W/ INTRAOCULAR LENS  IMPLANT, BILATERAL Bilateral 1980's  . CORONARY ANGIOGRAM  11/19/2011   Procedure: CORONARY ANGIOGRAM;  Surgeon: [name], MD;  Location: [facility] CATH LAB;  Service: Cardiovascular;;  . CORONARY ANGIOPLASTY WITH STENT PLACEMENT  12/22/2011    LAD & CIRCUMFLEX  . EYE SURGERY    . INTRAMEDULLARY (IM) NAIL INTERTROCHANTERIC Right 04/19/2016   Procedure: INTRAMEDULLARY (IM) NAIL  INTERTROCHANTRIC;  Surgeon: Sheral Apley, MD;  Location: [facility] OR;  Service: Orthopedics;  Laterality: Right;  . PERCUTANEOUS CORONARY STENT INTERVENTION (PCI-S) N/A 12/22/2011   Procedure: PERCUTANEOUS CORONARY STENT INTERVENTION (PCI-S);  Surgeon: [name], MD;  Location: [facility] CATH LAB;  Service: Cardiovascular;  Laterality: N/A;  . PERMANENT PACEMAKER INSERTION N/A 11/20/2011   STJ dual chamber PPM implanted by [name] for symptomatic bradycardia   . TONSILLECTOMY  ~ 1933       Home  Medications    Prior to Admission medications   Medication Sig Start Date End Date Taking? Authorizing Provider  acetaminophen (TYLENOL) 500 MG tablet Take 500 mg by mouth daily.   Yes Historical Provider, MD  allopurinol (ZYLOPRIM) 100 MG tablet Take 1 tablet (100 mg total) by mouth daily. 04/23/16  Yes Ripudeep Jenna Luo, MD  amiodarone (PACERONE) 200 MG tablet Take 100 mg by mouth daily.   Yes Historical Provider, MD  apixaban (ELIQUIS) 2.5 MG TABS tablet Take 2.5 mg by mouth 2 (two) times daily.   Yes Historical Provider, MD  atorvastatin (LIPITOR) 80 MG tablet Take 40 mg by mouth daily at 6 PM. 11/23/11  Yes Rinaldo Cloud, MD  carvedilol (COREG) 3.125 MG tablet Take 1 tablet (3.125 mg total) by mouth 2 (two) times daily with a meal. 04/23/16  Yes Ripudeep Jenna Luo, MD  cetirizine (ZYRTEC ALLERGY) 10 MG tablet Take 10 mg by mouth daily as needed for allergies or rhinitis.    Yes Historical Provider, MD  famotidine (PEPCID) 20 MG tablet Take 20 mg by mouth every evening.  12/23/11  Yes Rinaldo Cloud, MD  finasteride (PROSCAR) 5 MG tablet Take 5 mg by mouth every evening.  02/08/12  Yes Historical Provider, MD  fish oil-omega-3 fatty acids 1000 MG capsule Take 1 g by mouth every morning.    Yes Historical Provider, MD  furosemide (LASIX) 20 MG tablet Take 40 mg by mouth.    Yes Historical Provider, MD  HYDROcodone-acetaminophen (NORCO/VICODIN) 5-325 MG tablet Take 1 tablet by mouth every 6 (six) hours as needed for severe pain. 04/24/16  Yes Tiffany L Reed, DO  ipratropium-albuterol (DUONEB) 0.5-2.5 (3) MG/3ML SOLN Take 3 mLs by nebulization 3 (three) times daily. 04/23/16  Yes Ripudeep Jenna Luo, MD  levothyroxine (SYNTHROID, LEVOTHROID) 75 MCG tablet Take 75 mcg by mouth daily before breakfast. 04/30/14  Yes Historical Provider, MD  linaclotide (LINZESS) 72 MCG capsule Take 72 mcg by mouth daily as needed (for constipation).   Yes Historical Provider, MD  Menthol, Topical Analgesic, (BIOFREEZE) 4 % GEL Apply to  right knee for discomfort three times a day prn   Yes Historical Provider, MD  Menthol, Topical Analgesic, (BLUE-EMU MAXIMUM STRENGTH EX) Apply 1 application topically daily. Applied to knees   Yes Historical Provider, MD  Multiple Vitamin (MULTIVITAMIN WITH MINERALS) TABS tablet Take 1 tablet by mouth daily. 04/23/16  Yes Ripudeep Jenna Luo, MD  nitroGLYCERIN (NITROSTAT) 0.4 MG SL tablet Place 1 tablet (0.4 mg total) under the tongue every 5 (five) minutes x 3 doses as needed for chest pain. 11/23/11  Yes Rinaldo Cloud, MD  polyethylene glycol powder (GLYCOLAX/MIRALAX) powder Take 17 g by mouth daily as needed for moderate constipation.  10/27/11  Yes Historical Provider, MD  sacubitril-valsartan (ENTRESTO) 24-26 MG Take 1 tablet by mouth 2 (two) times daily.   Yes Historical Provider, MD  senna-docusate (SENOKOT-S) 8.6-50 MG tablet Take 1 tablet by mouth 2 (two) times daily. 04/23/16  Yes Ripudeep K  Rai, MD    Family History Family History  Problem Relation Age of Onset  . Other Father     Accidental death @ age 46 - box fell on him at work  . Cancer Mother     died @ 62 Bladder cancer    Social History Social History  Substance Use Topics  . Smoking status: Former Smoker    Packs/day: 1.00    Years: 35.00    Types: Cigarettes    Quit date: 12/17/1980  . Smokeless tobacco: Never Used     Comment: Quit at age 60.  Marland Kitchen Alcohol use No     Allergies   Iodinated diagnostic agents and Isovue [iopamidol]   Review of Systems Review of Systems  Constitutional: Negative for fever.  Respiratory: Positive for shortness of breath.   Cardiovascular: Negative for chest pain.  Gastrointestinal: Negative for abdominal pain, nausea and vomiting.  Musculoskeletal: Positive for myalgias.  All other systems reviewed and are negative.    Physical Exam Updated Vital Signs BP 128/74   Pulse 91   Temp 97.5 F (36.4 C) (Oral)   Resp (!) 22   Ht 5\' 10"  (1.778 m)   Wt 174 lb (78.9 kg)   SpO2 94%    BMI 24.97 kg/m   Physical Exam  Constitutional: He is oriented to person, place, and time. He appears well-developed and well-nourished. He appears distressed.  HENT:  Head: Normocephalic and atraumatic.  Eyes: Conjunctivae and EOM are normal.  Neck: Neck supple. No tracheal deviation present.  Cardiovascular:  Mild tachycardia. RLE edema and bruising with asymmetry.   Pulmonary/Chest: He is in respiratory distress. He has wheezes. He has rales.  Tachypneic. Increased work of breathing. Diffused wheezing. Rales at right base.  Musculoskeletal: Normal range of motion.  Neurological: He is alert and oriented to person, place, and time.  Skin: Skin is warm and dry.  Psychiatric: He has a normal mood and affect. His behavior is normal.  Nursing note and vitals reviewed.  ED Treatments / Results  DIAGNOSTIC STUDIES: Oxygen Saturation is 95% on [place], adequate by my interpretation.    COORDINATION OF CARE: 11:44 AM Discussed treatment plan with pt at bedside and pt agreed to plan. I will check the patient's CXR.  Labs (all labs ordered are listed, but only abnormal results are displayed) Labs Reviewed  CBC - Abnormal; Notable for the following:       Result Value   RBC 3.61 (*)    Hemoglobin 12.5 (*)    HCT 37.1 (*)    MCV 102.8 (*)    MCH 34.6 (*)    RDW 17.9 (*)    All other components within normal limits  COMPREHENSIVE METABOLIC PANEL - Abnormal; Notable for the following:    Chloride 100 (*)    BUN 48 (*)    Creatinine, Ser 1.78 (*)    Total Protein 6.4 (*)    Albumin 3.1 (*)    ALT 9 (*)    GFR calc non Af Amer 32 (*)    GFR calc Af Amer 37 (*)    All other components within normal limits  BRAIN NATRIURETIC PEPTIDE - Abnormal; Notable for the following:    B Natriuretic Peptide 962.0 (*)    All other components within normal limits  URINALYSIS, COMPLETE (UACMP) WITH MICROSCOPIC - Abnormal; Notable for the following:    Protein, ur 30 (*)    Squamous Epithelial / LPF  0-5 (*)    All other  components within normal limits  I-STAT CG4 LACTIC ACID, ED - Abnormal; Notable for the following:    Lactic Acid, Venous 2.10 (*)    All other components within normal limits  CULTURE, BLOOD (ROUTINE X 2)  CULTURE, BLOOD (ROUTINE X 2)  URINE CULTURE  TROPONIN I    EKG  EKG Interpretation  Date/Time:  Thursday May 21 2016 11:25:23 EDT Ventricular Rate:  84 PR Interval:    QRS Duration: 179 QT Interval:  406 QTC Calculation: 480 R Axis:   77 Text Interpretation:  Atrial fibrillation Right bundle branch block Since last tracing No significant change was found prior pacing spakes not seen on this ECG Confirmed by Kariel Skillman  MD, Rever Pichette ([id number]) on 05/21/2016 12:14:26 PM       Radiology US Venous Img Lower Unilateral Right  Result Date: 05/21/2016 CLINICAL DATA:  Right lower extremity edema.  Recent hip fracture EXAM: RIGHT LOWER EXTREMITY VENOUS DUPLEX ULTRASOUND TECHNIQUE: Gray-scale sonography with graded compression, as well as color Doppler and duplex ultrasound were performed to evaluate the right lower extremity deep venous system from the level of the common femoral vein and including the common femoral, femoral, profunda femoral, popliteal and calf veins including the posterior tibial, peroneal and gastrocnemius veins when visible. The superficial great saphenous vein was also interrogated. Spectral Doppler was utilized to evaluate flow at rest and with distal augmentation maneuvers in the common femoral, femoral and popliteal veins. COMPARISON:  None. FINDINGS: Contralateral Common Femoral Vein: Respiratory phasicity is normal and symmetric with the symptomatic side. No evidence of thrombus. Normal compressibility. Common Femoral Vein: No evidence of thrombus. Normal compressibility, respiratory phasicity and response to augmentation. Saphenofemoral Junction: No evidence of thrombus. Normal compressibility and flow on color Doppler imaging. Profunda Femoral Vein: No  evidence of thrombus. Normal compressibility and flow on color Doppler imaging. Femoral Vein: No evidence of thrombus. Normal compressibility, respiratory phasicity and response to augmentation. Popliteal Vein: No evidence of thrombus. Normal compressibility, respiratory phasicity and response to augmentation. Calf Veins: No evidence of thrombus. Normal compressibility and flow on color Doppler imaging. Superficial Great Saphenous Vein: No evidence of thrombus. Normal compressibility and flow on color Doppler imaging. Venous Reflux:  None. Other Findings: There is a multi septated popliteal cyst in the popliteal fossa measuring 4.2 x 1.9 x 3.0 cm. IMPRESSION: No evidence of right lower extremity deep venous thrombosis. Left common femoral vein also patent. Complex Baker cyst in the right popliteal fossa measuring 4.2 x 1.9 x 3.0 cm. Electronically Signed   By: Bretta Bang III M.D.   On: 05/21/2016 13:08   Dg Chest Portable 1 View  Result Date: 05/21/2016 CLINICAL DATA:  Shortness of breath for 3 days, former smoker EXAM: PORTABLE CHEST 1 VIEW COMPARISON:  04/17/2016 FINDINGS: Cardiomediastinal silhouette is stable. Mild reticular interstitial prominence bilaterally. Mild interstitial edema cannot be excluded. Stable streaky bilateral basilar atelectasis or scarring. No segmental infiltrate. Double lead cardiac pacemaker is unchanged in position. IMPRESSION: Reticular mild interstitial prominence bilateral suspicious for mild interstitial edema. Stable streaky bilateral basilar atelectasis or scarring. No segmental infiltrate. Electronically Signed   By: Natasha Mead M.D.   On: 05/21/2016 12:10    Procedures Procedures (including critical care time)  Medications Ordered in ED Medications  albuterol (PROVENTIL,VENTOLIN) solution continuous neb (10 mg/hr Nebulization Given 05/21/16 1202)  furosemide (LASIX) injection 40 mg (40 mg Intravenous Given 05/21/16 1320)     Initial Impression / Assessment and  Plan / ED Course  I have reviewed  the triage vital signs and the nursing notes.  Pertinent labs & imaging results that were available during my care of the patient were reviewed by me and considered in my medical decision making (see chart for details).     There was increased respiratory distress, the patient had diffuse wheezing and some rales, his BNP was near 1000 and his chest x-ray was consistent with having some pulmonary edema. Given his severe nonischemic cardiomyopathy with severely depressed ejection fraction the decision was made to give the patient Lasix and admitted to the hospital. Of note the patient also had a declining renal function. The patient is critically ill with acute onset of pulmonary edema, shortness of breath and likely congestive heart failure. His ultrasound of his leg is pending at this time. He may need further evaluation for pulmonary embolism.  Chest x-ray reveals pulmonary edema, the patient is not hypertensive but is significantly hypoxic and requiring 5 L by nasal cannula. Lasix given, the patient will be admitted to the hospital. I discussed this with Dr. Ardyth Harps who is in agreement.  CRITICAL CARE Performed by: Vida Roller Total critical care time: 35 minutes Critical care time was exclusive of separately billable procedures and treating other patients. Critical care was necessary to treat or prevent imminent or life-threatening deterioration. Critical care was time spent personally by me on the following activities: development of treatment plan with patient and/or surrogate as well as nursing, discussions with consultants, evaluation of patient's response to treatment, examination of patient, obtaining history from patient or surrogate, ordering and performing treatments and interventions, ordering and review of laboratory studies, ordering and review of radiographic studies, pulse oximetry and re-evaluation of patient's condition.   Final Clinical  Impressions(s) / ED Diagnoses   Final diagnoses:  Acute on chronic systolic congestive heart failure (HCC)  Respiratory distress    New Prescriptions New Prescriptions   No medications on file   I personally performed the services described in this documentation, which was scribed in my presence. The recorded information has been reviewed and is accurate.       Eber Hong, MD 05/21/16 1352

## 2016-05-21 NOTE — ED Notes (Signed)
Pt bladder scanned and 545 was result. Dr. Hyacinth Meeker notified and gave verbal to insert foley catheter.  Pt reports at this time that he has to urinate but nothing will come out.  MD informed.

## 2016-05-21 NOTE — ED Notes (Signed)
550cc output of urine from foley catheter, pt states he feels relief at this time.

## 2016-05-21 NOTE — Progress Notes (Signed)
Notified Dr. Selena Batten pt having low BPs. Dr. Selena Batten advised to hold Lasix dose and to reassess pt BP. Will continue to monitor pt.

## 2016-05-21 NOTE — ED Triage Notes (Signed)
Per EMS: Pt reports sob since yesterday with low 02 sats.  Pt upon arrival was in the 70s on 02 sat and was given two breathing txs followed by [facility] and is now on 4L 02 [place] and 02 sat increased to 97%.  Pt has no known lung disease.  Pt alert and oriented, works to breathe while speaking, denies pain. Pt had hip fracture 3 weeks ago and had surgery.

## 2016-05-21 NOTE — ED Notes (Signed)
Dr. Hyacinth Meeker informed of lactic acid 2.10

## 2016-05-22 ENCOUNTER — Inpatient Hospital Stay (HOSPITAL_COMMUNITY): Payer: Medicare Other

## 2016-05-22 ENCOUNTER — Encounter (HOSPITAL_COMMUNITY): Payer: Self-pay

## 2016-05-22 DIAGNOSIS — N179 Acute kidney failure, unspecified: Secondary | ICD-10-CM

## 2016-05-22 DIAGNOSIS — Z950 Presence of cardiac pacemaker: Secondary | ICD-10-CM

## 2016-05-22 DIAGNOSIS — I25718 Atherosclerosis of autologous vein coronary artery bypass graft(s) with other forms of angina pectoris: Secondary | ICD-10-CM

## 2016-05-22 DIAGNOSIS — I4891 Unspecified atrial fibrillation: Secondary | ICD-10-CM

## 2016-05-22 DIAGNOSIS — J9621 Acute and chronic respiratory failure with hypoxia: Secondary | ICD-10-CM

## 2016-05-22 DIAGNOSIS — L8990 Pressure ulcer of unspecified site, unspecified stage: Secondary | ICD-10-CM | POA: Insufficient documentation

## 2016-05-22 DIAGNOSIS — I5023 Acute on chronic systolic (congestive) heart failure: Secondary | ICD-10-CM

## 2016-05-22 LAB — MAGNESIUM: MAGNESIUM: 2 mg/dL (ref 1.7–2.4)

## 2016-05-22 LAB — BASIC METABOLIC PANEL
Anion gap: 9 (ref 5–15)
BUN: 47 mg/dL — AB (ref 6–20)
CHLORIDE: 101 mmol/L (ref 101–111)
CO2: 22 mmol/L (ref 22–32)
Calcium: 8.4 mg/dL — ABNORMAL LOW (ref 8.9–10.3)
Creatinine, Ser: 1.83 mg/dL — ABNORMAL HIGH (ref 0.61–1.24)
GFR calc Af Amer: 36 mL/min — ABNORMAL LOW (ref >60)
GFR calc non Af Amer: 31 mL/min — ABNORMAL LOW (ref >60)
Glucose, Bld: 103 mg/dL — ABNORMAL HIGH (ref 65–99)
POTASSIUM: 4.5 mmol/L (ref 3.5–5.1)
SODIUM: 132 mmol/L — AB (ref 135–145)

## 2016-05-22 LAB — URINE CULTURE

## 2016-05-22 MED ORDER — TECHNETIUM TC 99M DIETHYLENETRIAME-PENTAACETIC ACID
30.0000 | Freq: Once | INTRAVENOUS | Status: AC | PRN
  Administered 2016-05-22: 33 via RESPIRATORY_TRACT

## 2016-05-22 MED ORDER — TECHNETIUM TO 99M ALBUMIN AGGREGATED
4.0000 | Freq: Once | INTRAVENOUS | Status: AC | PRN
  Administered 2016-05-22: 4.4 via INTRAVENOUS

## 2016-05-22 NOTE — Progress Notes (Signed)
His ischemic cardiomyopathy EF 25-30%, status post stenting and/or PTCA to 3 vessels with St. Jude dual-chamber pacer implanted status post hip repair was anticoagulated on request 2.5 by mouth twice a day admitted with dyspnea and intravascular volume overload sonography and VQ scan negative for DVT and/or PE low probability patient continues diuresis with Lasix 40 IV twice a day with improving respiratory status chest x-ray revealed volume overload cardiology also seeing patient Connor Diaz AJG:[id number] DOB: 1923-02-20 DOA: 05/21/2016 PCP: [name], MD   Physical Exam: Blood pressure 118/63, pulse 72, temperature 97.7 F (36.5 C), temperature source Oral, resp. rate 20, height 5\' 10"  (1.778 m), weight 76.5 kg (168 lb 10.4 oz), SpO2 97 %. Lungs diminished breath sounds in the bases prolonged expiratory phase scattered rhonchi no rales audible heart irregular regular no S3 auscultated no heaves thrills rubs is 2+ pedal edema noted no palpable cord   Investigations:  Recent Results (from the past 240 hour(s))  Urine culture     Status: Abnormal   Collection Time: 05/21/16 12:13 PM  Result Value Ref Range Status   Specimen Description URINE, CLEAN CATCH  Final   Special Requests NONE  Final   Culture (A)  Final    <10,000 COLONIES/mL INSIGNIFICANT GROWTH Performed at [facility], [address] [place], [place] [address]    Report Status 05/22/2016 FINAL  Final  Blood Culture (routine x 2)     Status: None (Preliminary result)   Collection Time: 05/21/16 12:17 PM  Result Value Ref Range Status   Specimen Description BLOOD LEFT ARM  Final   Special Requests   Final    BOTTLES DRAWN AEROBIC AND ANAEROBIC Blood Culture adequate volume   Culture NO GROWTH < 24 HOURS  Final   Report Status PENDING  Incomplete  Blood Culture (routine x 2)     Status: None (Preliminary result)   Collection Time: 05/21/16 12:20 PM  Result Value Ref Range Status   Specimen Description  BLOOD RIGHT HAND  Final   Special Requests   Final    BOTTLES DRAWN AEROBIC ONLY Blood Culture results may not be optimal due to an inadequate volume of blood received in culture bottles   Culture NO GROWTH < 24 HOURS  Final   Report Status PENDING  Incomplete     Basic Metabolic Panel:  Recent Labs  41/63/84 1145 05/22/16 0432  NA 135 132*  K 5.0 4.5  CL 100* 101  CO2 25 22  GLUCOSE 96 103*  BUN 48* 47*  CREATININE 1.78* 1.83*  CALCIUM 9.1 8.4*   Liver Function Tests:  Recent Labs  05/21/16 1145  AST 20  ALT 9*  ALKPHOS 101  BILITOT 0.8  PROT 6.4*  ALBUMIN 3.1*     CBC:  Recent Labs  05/21/16 1145  WBC 10.3  HGB 12.5*  HCT 37.1*  MCV 102.8*  PLT 330    Nm Pulmonary Perf And Vent  Result Date: 05/22/2016 CLINICAL DATA:  Hypoxia, history hypertension, paroxysmal atrial fibrillation, former smoker, stage III chronic kidney disease, systolic heart failure, coronary artery disease post PTCA, non ischemic cardiomyopathy EXAM: NUCLEAR MEDICINE VENTILATION - PERFUSION LUNG SCAN TECHNIQUE: Ventilation images were obtained in multiple projections using inhaled aerosol Tc-43m DTPA. Perfusion images were obtained in multiple projections after intravenous injection of Tc-86m MAA. RADIOPHARMACEUTICALS:  33 mCi Technetium-74m DTPA aerosol inhalation and 4.4 mCi Technetium-44m MAA IV COMPARISON:  Chest radiograph 05/21/2016 FINDINGS: Ventilation: Multiple segmental and subsegmental ventilation defects throughout both  lungs. Enlargement of cardiac silhouette. Perfusion: Much better perfusion than ventilation. Subsegmental perfusion defect defects in both lower lobes. Chest radiograph: Enlargement of cardiac silhouette with pulmonary vascular congestion and suspected pulmonary edema. IMPRESSION: Very low probability for pulmonary embolism. Electronically Signed   By: Ulyses Southward M.D.   On: 05/22/2016 08:56   US Venous Img Lower Unilateral Right  Result Date: 05/21/2016 CLINICAL  DATA:  Right lower extremity edema.  Recent hip fracture EXAM: RIGHT LOWER EXTREMITY VENOUS DUPLEX ULTRASOUND TECHNIQUE: Gray-scale sonography with graded compression, as well as color Doppler and duplex ultrasound were performed to evaluate the right lower extremity deep venous system from the level of the common femoral vein and including the common femoral, femoral, profunda femoral, popliteal and calf veins including the posterior tibial, peroneal and gastrocnemius veins when visible. The superficial great saphenous vein was also interrogated. Spectral Doppler was utilized to evaluate flow at rest and with distal augmentation maneuvers in the common femoral, femoral and popliteal veins. COMPARISON:  None. FINDINGS: Contralateral Common Femoral Vein: Respiratory phasicity is normal and symmetric with the symptomatic side. No evidence of thrombus. Normal compressibility. Common Femoral Vein: No evidence of thrombus. Normal compressibility, respiratory phasicity and response to augmentation. Saphenofemoral Junction: No evidence of thrombus. Normal compressibility and flow on color Doppler imaging. Profunda Femoral Vein: No evidence of thrombus. Normal compressibility and flow on color Doppler imaging. Femoral Vein: No evidence of thrombus. Normal compressibility, respiratory phasicity and response to augmentation. Popliteal Vein: No evidence of thrombus. Normal compressibility, respiratory phasicity and response to augmentation. Calf Veins: No evidence of thrombus. Normal compressibility and flow on color Doppler imaging. Superficial Great Saphenous Vein: No evidence of thrombus. Normal compressibility and flow on color Doppler imaging. Venous Reflux:  None. Other Findings: There is a multi septated popliteal cyst in the popliteal fossa measuring 4.2 x 1.9 x 3.0 cm. IMPRESSION: No evidence of right lower extremity deep venous thrombosis. Left common femoral vein also patent. Complex Baker cyst in the right  popliteal fossa measuring 4.2 x 1.9 x 3.0 cm. Electronically Signed   By: Bretta Bang III M.D.   On: 05/21/2016 13:08   Dg Chest Portable 1 View  Result Date: 05/21/2016 CLINICAL DATA:  Shortness of breath for 3 days, former smoker EXAM: PORTABLE CHEST 1 VIEW COMPARISON:  04/17/2016 FINDINGS: Cardiomediastinal silhouette is stable. Mild reticular interstitial prominence bilaterally. Mild interstitial edema cannot be excluded. Stable streaky bilateral basilar atelectasis or scarring. No segmental infiltrate. Double lead cardiac pacemaker is unchanged in position. IMPRESSION: Reticular mild interstitial prominence bilateral suspicious for mild interstitial edema. Stable streaky bilateral basilar atelectasis or scarring. No segmental infiltrate. Electronically Signed   By: Natasha Mead M.D.   On: 05/21/2016 12:10      Medication  Impression:  Principal Problem:   Acute on chronic respiratory failure with hypoxia (HCC) Active Problems:   Pacemaker-St. Jude   CAD (coronary artery disease)   Non-ischemic cardiomyopathy (HCC)   Paroxysmal atrial fibrillation (HCC)   Intertrochanteric fracture of right hip (HCC)   Acute systolic CHF (congestive heart failure) (HCC)   ARF (acute renal failure) (HCC)   Pressure injury of skin     Plan: Continue Lasix 40 IV every 12. Mobilize patient. Continue liquids 2.5 by mouth twice a day monitor B med daily 3 already ordered. Monitor serum magnesium today physical therapy for strengthening and ambulation  Consultants: Cardiology and physical therapy   Procedures ventilation perfusion scan low probability   Antibiotics:  Time spent: 30 minutes   LOS: 1 day   Claudette Wermuth M   05/22/2016, 12:13 PM

## 2016-05-22 NOTE — Care Management Note (Signed)
Case Management Note  Patient Details  Name: Connor Diaz MRN: [id number] Date of Birth: January 23, 1924  Subjective/Objective:   Adm with acute CHF. Recent discharge from Rehab, was there after hip fracture. Discharged home with [facility] from [facility] Lives at home with Wife. Has RW, WC, BSC PTA. Patient currently on 4 Liters of oxygen, no oxygen at home.              Action/Plan: CM will place resumption orders for [facility] and follow for oxygen/ongoing needs.    Expected Discharge Date:    05/24/2016             Expected Discharge Plan:  Home w Home Health Services  In-House Referral:     Discharge planning Services  CM Consult  Post Acute Care Choice:  Home Health, Resumption of Svcs/PTA Provider Choice offered to:  Patient  DME Arranged:    DME Agency:     HH Arranged:    HH Agency:  [facility]  Status of Service:  In process, will continue to follow  If discussed at Long Length of Stay Meetings, dates discussed:    Additional Comments:  Jazelle Achey, Chrystine Oiler, RN 05/22/2016, 11:22 AM

## 2016-05-22 NOTE — Care Management Important Message (Signed)
Important Message  Patient Details  Name: CARMAN FLACH MRN: [id number] Date of Birth: 11-10-1923   Medicare Important Message Given:  Yes    Makya Yurko, Chrystine Oiler, RN 05/22/2016, 12:30 PM

## 2016-05-22 NOTE — Consult Note (Signed)
CARDIOLOGY CONSULT NOTE   Patient ID: Connor Diaz MRN: [id number] DOB/AGE: 1924-02-12 83 y.o.  Admit Date: 05/21/2016 Referring Physician: Delbert Harness MD.  Primary Physician: [name], MD Consulting Cardiologist: Prentice Docker MD Primary Cardiologist: Hillis Range MD Reason for Consultation:   Clinical Summary Connor Diaz is a 83 y.o. male who is being seen today for the evaluation of CHF at the request of Dr. Delbert Harness . He has a history of hypertension, AAA (4.5 X 4.5) in 02/2009, PAF, symptomatic bradycardia, s/p St. Jude dual chamber PPM, CAD with stents to LAD and Cx. ICM with EF of 25%-30%, CKD.   He has had recent surgical repair of  right hip fracture after a mechanical fall and right knee effusion. He was discharged on 04/23/2016 to [facility] for PT.  He returned home after 05/07/2016 and was undergoing home PT. PT tech noticed increased work of breathing and increased edema of the LEE. She called 911. He states that he has not limited salt intake at home.  He states that his breathing status worsened over a few days with LEE.   On arrival to ER, BP 150/88, HR 122, O2 Sat 95%. Pertinent labs: Lactic Acid 2.10, Hgb 12.5, Hct 37.1, Creatinine 1.78, BUN 48, BNP 962. Doppler ultrasound completed due to leg pain and edema revealing no evidence of DVT. CXR revealed mild interstitial edema EKG demonstrated atrial fib with RBBB, rate controlled.  He was treated with albuterol inhaler and lasix 40 mg IV. Since admission he has diuresed 1.7 liters.    Allergies  Allergen Reactions  . Iodinated Diagnostic Agents Rash and Swelling    Swelling in face, urticaria. Received Isovoc 370 (non-ionic) on 10/24/14.  . Isovue [Iopamidol] Itching and Swelling    Pt had slight erythema, itching and facial swelling right ear and right lower chin.  Pt sneezed several times after contrast injection.  Pt was monitored for 1 hr post injection and was given water to drink. Dr Gery Pray feels  he had a very mild reaction.  Premeds are warranted in this patient.  Thanks, J Bohm    Medications Scheduled Medications: . amiodarone  100 mg Oral Daily  . apixaban  2.5 mg Oral BID  . atorvastatin  40 mg Oral q1800  . carvedilol  3.125 mg Oral BID WC  . famotidine  20 mg Oral QPM  . finasteride  5 mg Oral QPM  . furosemide  40 mg Intravenous BID  . levothyroxine  75 mcg Oral QAC breakfast  . multivitamin with minerals  1 tablet Oral Daily  . omega-3 acid ethyl esters  1 g Oral q morning - 10a  . senna-docusate  1 tablet Oral BID  . sodium chloride flush  3 mL Intravenous Q12H     Infusions:   PRN Medications:  sodium chloride, acetaminophen, nitroGLYCERIN, ondansetron (ZOFRAN) IV, polyethylene glycol, sodium chloride flush   Past Medical History:  Diagnosis Date  . AAA (abdominal aortic aneurysm) (HCC)    a. 02/2009 U/S 4.4 x 4.5 cm  . BPH (benign prostatic hyperplasia)   . CKD (chronic kidney disease), stage III   . Coronary artery disease   . DJD (degenerative joint disease)   . History of tobacco abuse    a. roughly 36 pack years, quit @ age 17.  Marland Kitchen Hypertension   . Hypothyroidism   . Non-ischemic cardiomyopathy (HCC)   . PAF (paroxysmal atrial fibrillation) (HCC)    a. noted 11/2011  . Pancreatitis  a. 10/2011 - managed conservatively @ home.  . Symptomatic bradycardia    a. s/p STJ dual chamber PPM   . Systolic heart failure (HCC)    02/2013    Past Surgical History:  Procedure Laterality Date  . CATARACT EXTRACTION W/ INTRAOCULAR LENS  IMPLANT, BILATERAL Bilateral 1980's  . CORONARY ANGIOGRAM  11/19/2011   Procedure: CORONARY ANGIOGRAM;  Surgeon: [name], MD;  Location: [facility] CATH LAB;  Service: Cardiovascular;;  . CORONARY ANGIOPLASTY WITH STENT PLACEMENT  12/22/2011    LAD & CIRCUMFLEX  . EYE SURGERY    . INTRAMEDULLARY (IM) NAIL INTERTROCHANTERIC Right 04/19/2016   Procedure: INTRAMEDULLARY (IM) NAIL INTERTROCHANTRIC;  Surgeon: Sheral Apley,  MD;  Location: [facility] OR;  Service: Orthopedics;  Laterality: Right;  . PERCUTANEOUS CORONARY STENT INTERVENTION (PCI-S) N/A 12/22/2011   Procedure: PERCUTANEOUS CORONARY STENT INTERVENTION (PCI-S);  Surgeon: [name], MD;  Location: [facility] CATH LAB;  Service: Cardiovascular;  Laterality: N/A;  . PERMANENT PACEMAKER INSERTION N/A 11/20/2011   STJ dual chamber PPM implanted by [name] for symptomatic bradycardia   . TONSILLECTOMY  ~ 1933    Family History  Problem Relation Age of Onset  . Other Father     Accidental death @ age 60 - box fell on him at work  . Cancer Mother     died @ 47 Bladder cancer     Social History Mr. Cranshaw reports that he quit smoking about 35 years ago. His smoking use included Cigarettes. He has a 35.00 pack-year smoking history. He has never used smokeless tobacco. Mr. Texidor reports that he does not drink alcohol.  Review of Systems Complete review of systems are found to be negative unless outlined in H&P above.  Physical Examination Blood pressure 118/63, pulse 72, temperature 97.7 F (36.5 C), temperature source Oral, resp. rate 20, height 5\' 10"  (1.778 m), weight 168 lb 10.4 oz (76.5 kg), SpO2 97 %.  Intake/Output Summary (Last 24 hours) at 05/22/16 0909 Last data filed at 05/22/16 0647  Gross per 24 hour  Intake                0 ml  Output             1750 ml  Net            -1750 ml    Telemetry: Atrial fib  GEN:Ill and frail appearing male in no acute distress  HEENT: Conjunctiva and lids normal, oropharynx clear with moist mucosa. Neck: Supple, mildly  elevated JVP no carotid bruits, no thyromegaly. Lungs: Bibasilar crackles, no wheezes. Some coughing with inspiration.  Cardiac: Irregular rate and rhythm, somewhat distant, no S3 or significant systolic murmur, no pericardial rub. Abdomen: Soft, nontender, no hepatomegaly, bowel sounds present, no guarding or rebound. Extremities: None- pitting ankle edema, distal pulses 2+. Skin: Warm  and dry. Musculoskeletal: No kyphosis. Neuropsychiatric: Alert and oriented x3, affect grossly appropriate.  Prior Cardiac Testing/Procedures Echocardiogram 04/15/2016 Left ventricle: The cavity size was at the upper limits of   normal. Wall thickness was increased in a pattern of mild LVH.   Systolic function was severely reduced. The estimated ejection   fraction was in the range of 25% to 30%. Prominent apical   trabeculation. Diffuse hypokinesis. There is severe hypokinesis   of the basal-midinferolateral myocardium. The study is not   technically sufficient to allow evaluation of LV diastolic   function. - Ventricular septum: Septal motion showed abnormal function and   dyssynergy. -  Aortic valve: Moderately calcified annulus. Trileaflet;   moderately calcified leaflets. There was mild regurgitation. - Aortic root: The aortic root was mildly ectatic. - Mitral valve: Calcified annulus. There was mild regurgitation. - Right ventricle: Pacer wire or catheter noted in right ventricle.   Systolic function was mildly reduced. - Right atrium: Central venous pressure (est): 3 mm Hg. - Tricuspid valve: There was trivial regurgitation. - Pulmonary arteries: PA peak pressure: 39 mm Hg (S). - Pericardium, extracardiac: There was no pericardial effusion.  12/23/2011 Cardiac Cath PROCEDURES: 1. Successful PTCA to OM-3 using 2.5 x 8-mm long Trek balloon for     predilatation. 2. Successful deployment of 2.5 x 18-mm long Xience Xpedition drug-     eluting stent in mid OM-3. 3. Successful PTCA to proximal left circumflex using initially 2.5 x 8-     mm long Trek balloon and then 2.75 x 12-mm long Trek balloon. 4. Successful deployment of 3.0 x 15-mm long Xience Xpedition drug-     eluting stent in proximal left circumflex. 5. Successful postdilatation of this stent using 3.25 x 12-mm long [place]     Trek balloon. 6. Successful PTCA to mid and proximal LAD using 2.75 x 20-mm long [place]     Trek  balloon and then using 3.0 x 20-mm long [place] Trek balloon. 7. Successful deployment of 3.0 x long Xience Xpedition drug-eluting     stent in mid and proximal LAD. 8. Successful postdilatation of this stent using 3.25 x 20-mm long [place]     Trek balloon and then 3.25 x 9-mm long [place] Sprinter balloon going up     to 21 atmospheric pressure.  Pacemaker interrogation  Notes Recorded by Hillis Range, MD on 03/01/2016 at 10:04 PM EST Remote device check reviewed. Normal device function. Battery status, leads stable. Histograms reviewed and appropriate. afib noted Routine follow-up  Lab Results  Basic Metabolic Panel:  Recent Labs Lab 05/21/16 1145 05/22/16 0432  NA 135 132*  K 5.0 4.5  CL 100* 101  CO2 25 22  GLUCOSE 96 103*  BUN 48* 47*  CREATININE 1.78* 1.83*  CALCIUM 9.1 8.4*    Liver Function Tests:  Recent Labs Lab 05/21/16 1145  AST 20  ALT 9*  ALKPHOS 101  BILITOT 0.8  PROT 6.4*  ALBUMIN 3.1*    CBC:  Recent Labs Lab 05/21/16 1145  WBC 10.3  HGB 12.5*  HCT 37.1*  MCV 102.8*  PLT 330    Cardiac Enzymes:  Recent Labs Lab 05/21/16 1145  TROPONINI <0.03    BNP: Invalid input(s): POCBNP   Radiology: Nm Pulmonary Perf And Vent  Result Date: 05/22/2016 CLINICAL DATA:  Hypoxia, history hypertension, paroxysmal atrial fibrillation, former smoker, stage III chronic kidney disease, systolic heart failure, coronary artery disease post PTCA, non ischemic cardiomyopathy EXAM: NUCLEAR MEDICINE VENTILATION - PERFUSION LUNG SCAN TECHNIQUE: Ventilation images were obtained in multiple projections using inhaled aerosol Tc-67m DTPA. Perfusion images were obtained in multiple projections after intravenous injection of Tc-41m MAA. RADIOPHARMACEUTICALS:  33 mCi Technetium-28m DTPA aerosol inhalation and 4.4 mCi Technetium-25m MAA IV COMPARISON:  Chest radiograph 05/21/2016 FINDINGS: Ventilation: Multiple segmental and subsegmental ventilation defects throughout both lungs.  Enlargement of cardiac silhouette. Perfusion: Much better perfusion than ventilation. Subsegmental perfusion defect defects in both lower lobes. Chest radiograph: Enlargement of cardiac silhouette with pulmonary vascular congestion and suspected pulmonary edema. IMPRESSION: Very low probability for pulmonary embolism. Electronically Signed   By: Ulyses Southward M.D.   On: 05/22/2016 08:56  US Venous Img Lower Unilateral Right  Result Date: 05/21/2016 CLINICAL DATA:  Right lower extremity edema.  Recent hip fracture EXAM: RIGHT LOWER EXTREMITY VENOUS DUPLEX ULTRASOUND TECHNIQUE: Gray-scale sonography with graded compression, as well as color Doppler and duplex ultrasound were performed to evaluate the right lower extremity deep venous system from the level of the common femoral vein and including the common femoral, femoral, profunda femoral, popliteal and calf veins including the posterior tibial, peroneal and gastrocnemius veins when visible. The superficial great saphenous vein was also interrogated. Spectral Doppler was utilized to evaluate flow at rest and with distal augmentation maneuvers in the common femoral, femoral and popliteal veins. COMPARISON:  None. FINDINGS: Contralateral Common Femoral Vein: Respiratory phasicity is normal and symmetric with the symptomatic side. No evidence of thrombus. Normal compressibility. Common Femoral Vein: No evidence of thrombus. Normal compressibility, respiratory phasicity and response to augmentation. Saphenofemoral Junction: No evidence of thrombus. Normal compressibility and flow on color Doppler imaging. Profunda Femoral Vein: No evidence of thrombus. Normal compressibility and flow on color Doppler imaging. Femoral Vein: No evidence of thrombus. Normal compressibility, respiratory phasicity and response to augmentation. Popliteal Vein: No evidence of thrombus. Normal compressibility, respiratory phasicity and response to augmentation. Calf Veins: No evidence of  thrombus. Normal compressibility and flow on color Doppler imaging. Superficial Great Saphenous Vein: No evidence of thrombus. Normal compressibility and flow on color Doppler imaging. Venous Reflux:  None. Other Findings: There is a multi septated popliteal cyst in the popliteal fossa measuring 4.2 x 1.9 x 3.0 cm. IMPRESSION: No evidence of right lower extremity deep venous thrombosis. Left common femoral vein also patent. Complex Baker cyst in the right popliteal fossa measuring 4.2 x 1.9 x 3.0 cm. Electronically Signed   By: Bretta Bang III M.D.   On: 05/21/2016 13:08   Dg Chest Portable 1 View  Result Date: 05/21/2016 CLINICAL DATA:  Shortness of breath for 3 days, former smoker EXAM: PORTABLE CHEST 1 VIEW COMPARISON:  04/17/2016 FINDINGS: Cardiomediastinal silhouette is stable. Mild reticular interstitial prominence bilaterally. Mild interstitial edema cannot be excluded. Stable streaky bilateral basilar atelectasis or scarring. No segmental infiltrate. Double lead cardiac pacemaker is unchanged in position. IMPRESSION: Reticular mild interstitial prominence bilateral suspicious for mild interstitial edema. Stable streaky bilateral basilar atelectasis or scarring. No segmental infiltrate. Electronically Signed   By: Natasha Mead M.D.   On: 05/21/2016 12:10     ECG: Atrial fib with RBBB, rate of  84 bpm.    Impression and Recommendations:  1. Acute on Chronic Systolic CHF: He has had good diureses with IV lasix since admission, but is not yet well compensated. He remains on O2 support. He has been ruled out for PE and DVT, leading to LEE and worsening respiratory status, and will be continued on IV diureses. Review of home medications demonstrates that he is on 40 mg daily of po lasix, Entresto , and coreg. Creatinine this am 1.83. Sherryll Burger has been discontinued due to worsening renal function and hypotension.   2. Atrial fib: On amiodarone and BB for good rate control.  Remains on Eliquis 2.5  mg BID for anticoagulation. CHADS VASC Score 5. No evidence of bleeding.   3. CAD: Multiple stents, CX, LAD, and OM 3. No antiplatelet therapy at this time.   4. PPM in situ: St. Jude Last interrogated on 02/18/2016 per notes.     Signed: Bettey Mare. Lawrence NP [facility]  05/22/2016, 9:09 AM Co-Sign MD  The patient was seen and examined, and  I agree with the history, physical exam, assessment and plan as documented above, with modifications as noted below. I have also personally reviewed all relevant documentation, old records, labs, and both radiographic and cardiovascular studies. I have also independently interpreted old and new ECG's. 83 yr old with CAD, atrial fibrillation, and pacemaker admitted with acute on chronic systolic heart failure. He has diuresed 1.7 L on IV Lasix 40 mg bid and has symptomatically improved.  With respect to CAD, he denies chest pain. With respect to atrial fibrillation, he denies palpitations.  I would continue current therapy for now. I agree with discontinuation of Entresto given renal dysfunction.  Prentice Docker, MD, [facility]  05/22/2016 12:33 PM

## 2016-05-22 NOTE — Evaluation (Signed)
Physical Therapy Evaluation Patient Details Name: Connor Diaz MRN: [id number] DOB: 05/01/1923 Today's Date: 05/22/2016   History of Present Illness  83 y.o. male with h/o systolic CHF with known EF of 57-84%, recent right hip fracture and repair, CAD, CKD Stage III, HTN, HLD, Hypothyroidism, PAF anticoagulated on eliquis and a PPM for symptomatic bradycardia, is sent to the ED by the [facility] therapist due to concerns of extremity and peribuccal cyanosis with O2 sats in the 70s. He denies being SOB or having CP, does admit to being more fatigued than usual since being released from rehab approx 2 weeks ago. Labs show a Cr of 1.78 (baseline around 1.4-1.5), BNP of 962, initial lactic acid of 2.10, CXR with interstitial edema and no infiltrate. He has given 40 mg IV lasix with prompt diuresis and symptom improvement. Admission is requested for acute CHF.  Recent fall with R hip fx s/p ORIF with IM rod on 04/19/2016.    Clinical Impression  Pt received in bed, family present, and pt is agreeable to PT evaluation.  Pt normally mobilizes in a w/c, and is independent with transfers.  He was receiving HHPT, and has an aid that comes daily from 9am-5pm.  During PT evaluation, pt demonstrated independence with bed mobility, and was able to transfer bed<>chair with supervision.  Pt is recommended to d/c home with continued HHPT.      Follow Up Recommendations Home health PT    Equipment Recommendations  None recommended by PT    Recommendations for Other Services       Precautions / Restrictions Precautions Precautions: Fall Precaution Comments: with R hip fx s/p IMN Restrictions Weight Bearing Restrictions: No      Mobility  Bed Mobility Overal bed mobility: Modified Independent                Transfers Overall transfer level: Needs assistance Equipment used: None Transfers: Squat Pivot Transfers     Squat pivot transfers: Supervision     General transfer comment: Chair facing  patient, and pt is able to stand and pivot around to sit in the chair.  Supervision provided to manage lines including foley catheter and O2 tubing.   Ambulation/Gait Ambulation/Gait assistance:  (NA - pt does not ambulate at baseline. )              Stairs            Wheelchair Mobility    Modified Rankin (Stroke Patients Only)       Balance Overall balance assessment: Needs assistance Sitting-balance support: Bilateral upper extremity supported;Feet supported Sitting balance-Leahy Scale: Good     Standing balance support: Bilateral upper extremity supported Standing balance-Leahy Scale: Fair                               Pertinent Vitals/Pain Pain Assessment: No/denies pain    Home Living   Living Arrangements: Spouse/significant other;Children Available Help at Discharge: Available 24 hours/day;Personal care attendant (assist from 9am-5pm) Type of Home: House Home Access: Stairs to enter;Ramped entrance   Entrance Stairs-Number of Steps: 1 Home Layout: One level Home Equipment: Walker - 2 wheels;Walker - 4 wheels;Wheelchair - manual;Cane - single point;Bedside commode;Shower seat - built in;Grab bars - tub/shower;Other (comment) (bed rails. )      Prior Function     Gait / Transfers Assistance Needed: independnet with stand pivot transfer to w/c.    ADL's / Homemaking Assistance Needed:  independent with dressing and assistance with bathing.          Hand Dominance   Dominant Hand: Right    Extremity/Trunk Assessment                Communication   Communication: HOH  Cognition Arousal/Alertness: Awake/alert Behavior During Therapy: WFL for tasks assessed/performed Overall Cognitive Status: Within Functional Limits for tasks assessed                                        General Comments      Exercises     Assessment/Plan    PT Assessment Patient needs continued PT services  PT Problem List  Decreased strength;Decreased activity tolerance;Decreased balance;Decreased mobility       PT Treatment Interventions DME instruction;Functional mobility training;Therapeutic activities;Therapeutic exercise;Balance training;Gait training;Patient/family education;Wheelchair mobility training    PT Goals (Current goals can be found in the Care Plan section)  Acute Rehab PT Goals Patient Stated Goal: To go home PT Goal Formulation: With patient/family Time For Goal Achievement: 05/29/16 Potential to Achieve Goals: Fair    Frequency Min 3X/week   Barriers to discharge        Co-evaluation               End of Session Equipment Utilized During Treatment: Gait belt;Oxygen Activity Tolerance: Patient tolerated treatment well Patient left: in chair;with call bell/phone within reach;with family/visitor present Nurse Communication: Mobility status (mobility sheet left in the room. ) PT Visit Diagnosis: History of falling (Z91.81);Muscle weakness (generalized) (M62.81);Other abnormalities of gait and mobility (R26.89)    Time: 1761-6073 PT Time Calculation (min) (ACUTE ONLY): 29 min   Charges:   PT Evaluation $PT Eval Low Complexity: 1 Procedure PT Treatments $Therapeutic Activity: 8-22 mins   PT G Codes:   PT G-Codes **NOT FOR INPATIENT CLASS** Functional Assessment Tool Used: AM-PAC 6 Clicks Basic Mobility;Clinical judgement Functional Limitation: Mobility: Walking and moving around Mobility: Walking and Moving Around Current Status (X1062): At least 20 percent but less than 40 percent impaired, limited or restricted Mobility: Walking and Moving Around Goal Status [phone]): At least 1 percent but less than 20 percent impaired, limited or restricted    Beth Jaemarie Hochberg, PT, DPT X: [phone]

## 2016-05-23 LAB — BASIC METABOLIC PANEL
ANION GAP: 7 (ref 5–15)
BUN: 50 mg/dL — ABNORMAL HIGH (ref 6–20)
CALCIUM: 8.1 mg/dL — AB (ref 8.9–10.3)
CO2: 25 mmol/L (ref 22–32)
Chloride: 102 mmol/L (ref 101–111)
Creatinine, Ser: 1.83 mg/dL — ABNORMAL HIGH (ref 0.61–1.24)
GFR calc non Af Amer: 31 mL/min — ABNORMAL LOW (ref >60)
GFR, EST AFRICAN AMERICAN: 36 mL/min — AB (ref >60)
Glucose, Bld: 95 mg/dL (ref 65–99)
Potassium: 3.9 mmol/L (ref 3.5–5.1)
SODIUM: 134 mmol/L — AB (ref 135–145)

## 2016-05-23 NOTE — Progress Notes (Signed)
Subjective: This is a patient of Dr. Timmothy Sours Diego's who was admitted with acute on chronic systolic congestive heart failure, recent right hip fracture, coronary disease chronic kidney disease hypertension hyperlipidemia paroxysmal atrial fibrillation, permanent pacemaker and who came to the emergency department because his home home health physical therapist noted that he had cyanosis and his oxygen saturation were in the 55s. He was found to have pulmonary edema. He has been diuresing. At home he has been in a wheelchair but prior to his hip fracture he was able to ambulate. He says he feels better. He's not as short of breath. He has no new complaints. No chest pain. No nausea or vomiting.  Objective: Vital signs in last 24 hours: Temp:  [98.1 F (36.7 C)-98.7 F (37.1 C)] 98.2 F (36.8 C) (04/07 0627) Pulse Rate:  [68-83] 68 (04/07 0752) Resp:  [18-20] 20 (04/07 0627) BP: (102-124)/(60-68) 102/61 (04/07 0752) SpO2:  [92 %-98 %] 98 % (04/07 0750) Weight:  [76.5 kg (168 lb 9.7 oz)] 76.5 kg (168 lb 9.7 oz) (04/07 0627) Weight change: -2.446 kg (-5 lb 6.3 oz) Last BM Date: 05/22/16  Intake/Output from previous day: 04/06 0701 - 04/07 0700 In: 363 [P.O.:360; I.V.:3] Out: 1851 [Urine:1850; Stool:1]  PHYSICAL EXAM General appearance: alert, cooperative and no distress Resp: Diminished breath sounds bilaterally Cardio: Normal heart sounds. No gallop. I can tell by exam if he is in atrial fib GI: soft, non-tender; bowel sounds normal; no masses,  no organomegaly Extremities: extremities normal, atraumatic, no cyanosis or edema Skin warm and dry. He is hard of hearing.  Lab Results:  Results for orders placed or performed during the hospital encounter of 05/21/16 (from the past 48 hour(s))  Brain natriuretic peptide     Status: Abnormal   Collection Time: 05/21/16 11:40 AM  Result Value Ref Range   B Natriuretic Peptide 962.0 (H) 0.0 - 100.0 pg/mL  CBC     Status: Abnormal   Collection  Time: 05/21/16 11:45 AM  Result Value Ref Range   WBC 10.3 4.0 - 10.5 K/uL   RBC 3.61 (L) 4.22 - 5.81 MIL/uL   Hemoglobin 12.5 (L) 13.0 - 17.0 g/dL   HCT 37.1 (L) 39.0 - 52.0 %   MCV 102.8 (H) 78.0 - 100.0 fL   MCH 34.6 (H) 26.0 - 34.0 pg   MCHC 33.7 30.0 - 36.0 g/dL   RDW 17.9 (H) 11.5 - 15.5 %   Platelets 330 150 - 400 K/uL  Comprehensive metabolic panel     Status: Abnormal   Collection Time: 05/21/16 11:45 AM  Result Value Ref Range   Sodium 135 135 - 145 mmol/L   Potassium 5.0 3.5 - 5.1 mmol/L   Chloride 100 (L) 101 - 111 mmol/L   CO2 25 22 - 32 mmol/L   Glucose, Bld 96 65 - 99 mg/dL   BUN 48 (H) 6 - 20 mg/dL   Creatinine, Ser 1.78 (H) 0.61 - 1.24 mg/dL   Calcium 9.1 8.9 - 10.3 mg/dL   Total Protein 6.4 (L) 6.5 - 8.1 g/dL   Albumin 3.1 (L) 3.5 - 5.0 g/dL   AST 20 15 - 41 U/L   ALT 9 (L) 17 - 63 U/L   Alkaline Phosphatase 101 38 - 126 U/L   Total Bilirubin 0.8 0.3 - 1.2 mg/dL   GFR calc non Af Amer 32 (L) >60 mL/min   GFR calc Af Amer 37 (L) >60 mL/min    Comment: (NOTE) The eGFR has been  calculated using the CKD EPI equation. This calculation has not been validated in all clinical situations. eGFR's persistently <60 mL/min signify possible Chronic Kidney Disease.    Anion gap 10 5 - 15  Troponin I     Status: None   Collection Time: 05/21/16 11:45 AM  Result Value Ref Range   Troponin I <0.03 <0.03 ng/mL  Urinalysis, Complete w Microscopic     Status: Abnormal   Collection Time: 05/21/16 12:13 PM  Result Value Ref Range   Color, Urine YELLOW YELLOW   APPearance CLEAR CLEAR   Specific Gravity, Urine 1.013 1.005 - 1.030   pH 5.0 5.0 - 8.0   Glucose, UA NEGATIVE NEGATIVE mg/dL   Hgb urine dipstick NEGATIVE NEGATIVE   Bilirubin Urine NEGATIVE NEGATIVE   Ketones, ur NEGATIVE NEGATIVE mg/dL   Protein, ur 30 (A) NEGATIVE mg/dL   Nitrite NEGATIVE NEGATIVE   Leukocytes, UA NEGATIVE NEGATIVE   RBC / HPF 0-5 0 - 5 RBC/hpf   WBC, UA 0-5 0 - 5 WBC/hpf   Bacteria, UA  NONE SEEN NONE SEEN   Squamous Epithelial / LPF 0-5 (A) NONE SEEN   Mucous PRESENT   Urine culture     Status: Abnormal   Collection Time: 05/21/16 12:13 PM  Result Value Ref Range   Specimen Description URINE, CLEAN CATCH    Special Requests NONE    Culture (A)     <10,000 COLONIES/mL INSIGNIFICANT GROWTH Performed at [facility], [address] [place], [place] [address]    Report Status 05/22/2016 FINAL   I-Stat CG4 Lactic Acid, ED     Status: Abnormal   Collection Time: 05/21/16 12:17 PM  Result Value Ref Range   Lactic Acid, Venous 2.10 (HH) 0.5 - 1.9 mmol/L  Blood Culture (routine x 2)     Status: None (Preliminary result)   Collection Time: 05/21/16 12:17 PM  Result Value Ref Range   Specimen Description BLOOD LEFT ARM    Special Requests      BOTTLES DRAWN AEROBIC AND ANAEROBIC Blood Culture adequate volume   Culture NO GROWTH < 24 HOURS    Report Status PENDING   Blood Culture (routine x 2)     Status: None (Preliminary result)   Collection Time: 05/21/16 12:20 PM  Result Value Ref Range   Specimen Description BLOOD RIGHT HAND    Special Requests      BOTTLES DRAWN AEROBIC ONLY Blood Culture results may not be optimal due to an inadequate volume of blood received in culture bottles   Culture NO GROWTH < 24 HOURS    Report Status PENDING   I-Stat CG4 Lactic Acid, ED     Status: None   Collection Time: 05/21/16  3:16 PM  Result Value Ref Range   Lactic Acid, Venous 1.64 0.5 - 1.9 mmol/L  Basic metabolic panel     Status: Abnormal   Collection Time: 05/22/16  4:32 AM  Result Value Ref Range   Sodium 132 (L) 135 - 145 mmol/L   Potassium 4.5 3.5 - 5.1 mmol/L   Chloride 101 101 - 111 mmol/L   CO2 22 22 - 32 mmol/L   Glucose, Bld 103 (H) 65 - 99 mg/dL   BUN 47 (H) 6 - 20 mg/dL   Creatinine, Ser 1.83 (H) 0.61 - 1.24 mg/dL   Calcium 8.4 (L) 8.9 - 10.3 mg/dL   GFR calc non Af Amer 31 (L) >60 mL/min   GFR calc Af Amer 36 (L) >60  mL/min    Comment: (NOTE) The  eGFR has been calculated using the CKD EPI equation. This calculation has not been validated in all clinical situations. eGFR's persistently <60 mL/min signify possible Chronic Kidney Disease.    Anion gap 9 5 - 15  Magnesium     Status: None   Collection Time: 05/22/16  4:32 AM  Result Value Ref Range   Magnesium 2.0 1.7 - 2.4 mg/dL  Basic metabolic panel     Status: Abnormal   Collection Time: 05/23/16  5:42 AM  Result Value Ref Range   Sodium 134 (L) 135 - 145 mmol/L   Potassium 3.9 3.5 - 5.1 mmol/L   Chloride 102 101 - 111 mmol/L   CO2 25 22 - 32 mmol/L   Glucose, Bld 95 65 - 99 mg/dL   BUN 50 (H) 6 - 20 mg/dL   Creatinine, Ser 1.83 (H) 0.61 - 1.24 mg/dL   Calcium 8.1 (L) 8.9 - 10.3 mg/dL   GFR calc non Af Amer 31 (L) >60 mL/min   GFR calc Af Amer 36 (L) >60 mL/min    Comment: (NOTE) The eGFR has been calculated using the CKD EPI equation. This calculation has not been validated in all clinical situations. eGFR's persistently <60 mL/min signify possible Chronic Kidney Disease.    Anion gap 7 5 - 15    ABGS No results for input(s): PHART, PO2ART, TCO2, HCO3 in the last 72 hours.  Invalid input(s): PCO2 CULTURES Recent Results (from the past 240 hour(s))  Urine culture     Status: Abnormal   Collection Time: 05/21/16 12:13 PM  Result Value Ref Range Status   Specimen Description URINE, CLEAN CATCH  Final   Special Requests NONE  Final   Culture (A)  Final    <10,000 COLONIES/mL INSIGNIFICANT GROWTH Performed at [facility], [address] [place],  [address]    Report Status 05/22/2016 FINAL  Final  Blood Culture (routine x 2)     Status: None (Preliminary result)   Collection Time: 05/21/16 12:17 PM  Result Value Ref Range Status   Specimen Description BLOOD LEFT ARM  Final   Special Requests   Final    BOTTLES DRAWN AEROBIC AND ANAEROBIC Blood Culture adequate volume   Culture NO GROWTH < 24 HOURS  Final   Report Status PENDING  Incomplete   Blood Culture (routine x 2)     Status: None (Preliminary result)   Collection Time: 05/21/16 12:20 PM  Result Value Ref Range Status   Specimen Description BLOOD RIGHT HAND  Final   Special Requests   Final    BOTTLES DRAWN AEROBIC ONLY Blood Culture results may not be optimal due to an inadequate volume of blood received in culture bottles   Culture NO GROWTH < 24 HOURS  Final   Report Status PENDING  Incomplete   Studies/Results: Nm Pulmonary Perf And Vent  Result Date: 05/22/2016 CLINICAL DATA:  Hypoxia, history hypertension, paroxysmal atrial fibrillation, former smoker, stage III chronic kidney disease, systolic heart failure, coronary artery disease post PTCA, non ischemic cardiomyopathy EXAM: NUCLEAR MEDICINE VENTILATION - PERFUSION LUNG SCAN TECHNIQUE: Ventilation images were obtained in multiple projections using inhaled aerosol Tc-53mDTPA. Perfusion images were obtained in multiple projections after intravenous injection of Tc-978mAA. RADIOPHARMACEUTICALS:  33 mCi Technetium-9932mPA aerosol inhalation and 4.4 mCi Technetium-109m21m IV COMPARISON:  Chest radiograph 05/21/2016 FINDINGS: Ventilation: Multiple segmental and subsegmental ventilation defects throughout both lungs. Enlargement of cardiac silhouette. Perfusion: Much  better perfusion than ventilation. Subsegmental perfusion defect defects in both lower lobes. Chest radiograph: Enlargement of cardiac silhouette with pulmonary vascular congestion and suspected pulmonary edema. IMPRESSION: Very low probability for pulmonary embolism. Electronically Signed   By: Lavonia Dana M.D.   On: 05/22/2016 08:56   US Venous Img Lower Unilateral Right  Result Date: 05/21/2016 CLINICAL DATA:  Right lower extremity edema.  Recent hip fracture EXAM: RIGHT LOWER EXTREMITY VENOUS DUPLEX ULTRASOUND TECHNIQUE: Gray-scale sonography with graded compression, as well as color Doppler and duplex ultrasound were performed to evaluate the right lower  extremity deep venous system from the level of the common femoral vein and including the common femoral, femoral, profunda femoral, popliteal and calf veins including the posterior tibial, peroneal and gastrocnemius veins when visible. The superficial great saphenous vein was also interrogated. Spectral Doppler was utilized to evaluate flow at rest and with distal augmentation maneuvers in the common femoral, femoral and popliteal veins. COMPARISON:  None. FINDINGS: Contralateral Common Femoral Vein: Respiratory phasicity is normal and symmetric with the symptomatic side. No evidence of thrombus. Normal compressibility. Common Femoral Vein: No evidence of thrombus. Normal compressibility, respiratory phasicity and response to augmentation. Saphenofemoral Junction: No evidence of thrombus. Normal compressibility and flow on color Doppler imaging. Profunda Femoral Vein: No evidence of thrombus. Normal compressibility and flow on color Doppler imaging. Femoral Vein: No evidence of thrombus. Normal compressibility, respiratory phasicity and response to augmentation. Popliteal Vein: No evidence of thrombus. Normal compressibility, respiratory phasicity and response to augmentation. Calf Veins: No evidence of thrombus. Normal compressibility and flow on color Doppler imaging. Superficial Great Saphenous Vein: No evidence of thrombus. Normal compressibility and flow on color Doppler imaging. Venous Reflux:  None. Other Findings: There is a multi septated popliteal cyst in the popliteal fossa measuring 4.2 x 1.9 x 3.0 cm. IMPRESSION: No evidence of right lower extremity deep venous thrombosis. Left common femoral vein also patent. Complex Baker cyst in the right popliteal fossa measuring 4.2 x 1.9 x 3.0 cm. Electronically Signed   By: Lowella Grip III M.D.   On: 05/21/2016 13:08   Dg Chest Portable 1 View  Result Date: 05/21/2016 CLINICAL DATA:  Shortness of breath for 3 days, former smoker EXAM: PORTABLE CHEST 1  VIEW COMPARISON:  04/17/2016 FINDINGS: Cardiomediastinal silhouette is stable. Mild reticular interstitial prominence bilaterally. Mild interstitial edema cannot be excluded. Stable streaky bilateral basilar atelectasis or scarring. No segmental infiltrate. Double lead cardiac pacemaker is unchanged in position. IMPRESSION: Reticular mild interstitial prominence bilateral suspicious for mild interstitial edema. Stable streaky bilateral basilar atelectasis or scarring. No segmental infiltrate. Electronically Signed   By: Lahoma Crocker M.D.   On: 05/21/2016 12:10    Medications:  Prior to Admission:  Prescriptions Prior to Admission  Medication Sig Dispense Refill Last Dose  . acetaminophen (TYLENOL) 500 MG tablet Take 500 mg by mouth daily.   05/20/2016 at Unknown time  . allopurinol (ZYLOPRIM) 100 MG tablet Take 1 tablet (100 mg total) by mouth daily.   05/20/2016 at Unknown time  . amiodarone (PACERONE) 200 MG tablet Take 100 mg by mouth daily.   05/20/2016 at Unknown time  . apixaban (ELIQUIS) 2.5 MG TABS tablet Take 2.5 mg by mouth 2 (two) times daily.   05/20/2016 at 1830  . atorvastatin (LIPITOR) 80 MG tablet Take 40 mg by mouth daily at 6 PM.   05/20/2016 at Unknown time  . carvedilol (COREG) 3.125 MG tablet Take 1 tablet (3.125 mg total) by mouth 2 (two)  times daily with a meal.   05/20/2016 at 1830  . cetirizine (ZYRTEC ALLERGY) 10 MG tablet Take 10 mg by mouth daily as needed for allergies or rhinitis.    unknown  . famotidine (PEPCID) 20 MG tablet Take 20 mg by mouth every evening.    05/20/2016 at Unknown time  . finasteride (PROSCAR) 5 MG tablet Take 5 mg by mouth every evening.    05/20/2016 at Unknown time  . fish oil-omega-3 fatty acids 1000 MG capsule Take 1 g by mouth every morning.    05/20/2016 at Unknown time  . furosemide (LASIX) 20 MG tablet Take 40 mg by mouth.    05/20/2016 at Unknown time  . HYDROcodone-acetaminophen (NORCO/VICODIN) 5-325 MG tablet Take 1 tablet by mouth every 6 (six) hours as  needed for severe pain. 120 tablet 0 unknown  . ipratropium-albuterol (DUONEB) 0.5-2.5 (3) MG/3ML SOLN Take 3 mLs by nebulization 3 (three) times daily. 360 mL  05/21/2016 at Unknown time  . levothyroxine (SYNTHROID, LEVOTHROID) 75 MCG tablet Take 75 mcg by mouth daily before breakfast.  5 05/20/2016 at Unknown time  . linaclotide (LINZESS) 72 MCG capsule Take 72 mcg by mouth daily as needed (for constipation).   unknown  . Menthol, Topical Analgesic, (BIOFREEZE) 4 % GEL Apply to right knee for discomfort three times a day prn   05/20/2016 at Unknown time  . Menthol, Topical Analgesic, (BLUE-EMU MAXIMUM STRENGTH EX) Apply 1 application topically daily. Applied to knees   05/20/2016 at Unknown time  . Multiple Vitamin (MULTIVITAMIN WITH MINERALS) TABS tablet Take 1 tablet by mouth daily.   05/20/2016 at Unknown time  . nitroGLYCERIN (NITROSTAT) 0.4 MG SL tablet Place 1 tablet (0.4 mg total) under the tongue every 5 (five) minutes x 3 doses as needed for chest pain. 25 tablet 3 unknown  . polyethylene glycol powder (GLYCOLAX/MIRALAX) powder Take 17 g by mouth daily as needed for moderate constipation.    unknown  . sacubitril-valsartan (ENTRESTO) 24-26 MG Take 1 tablet by mouth 2 (two) times daily.   05/20/2016 at Unknown time  . senna-docusate (SENOKOT-S) 8.6-50 MG tablet Take 1 tablet by mouth 2 (two) times daily.   05/20/2016 at Unknown time   Scheduled: . amiodarone  100 mg Oral Daily  . apixaban  2.5 mg Oral BID  . atorvastatin  40 mg Oral q1800  . carvedilol  3.125 mg Oral BID WC  . famotidine  20 mg Oral QPM  . finasteride  5 mg Oral QPM  . furosemide  40 mg Intravenous BID  . levothyroxine  75 mcg Oral QAC breakfast  . multivitamin with minerals  1 tablet Oral Daily  . omega-3 acid ethyl esters  1 g Oral q morning - 10a  . senna-docusate  1 tablet Oral BID  . sodium chloride flush  3 mL Intravenous Q12H   Continuous:  BSW:HQPRFF chloride, acetaminophen, nitroGLYCERIN, ondansetron (ZOFRAN) IV,  polyethylene glycol, sodium chloride flush  Assesment: He was admitted with acute on chronic hypoxic respiratory failure. He has acute on chronic systolic heart failure. His renal function is not normal but it's pretty stable. He is doing better with diuresis. He is still weak. No chest pain and no other new problems. Principal Problem:   Acute on chronic respiratory failure with hypoxia (HCC) Active Problems:   Pacemaker-[employer]   CAD (coronary artery disease)   Non-ischemic cardiomyopathy (HCC)   Paroxysmal atrial fibrillation (HCC)   Intertrochanteric fracture of right hip (HCC)   Acute systolic  CHF (congestive heart failure) (HCC)   ARF (acute renal failure) (HCC)   Pressure injury of skin    Plan: Continue IV diuresis. Recheck kidney function tomorrow    LOS: 2 days   Arna Luis L 05/23/2016, 10:19 AM

## 2016-05-24 LAB — BASIC METABOLIC PANEL
ANION GAP: 7 (ref 5–15)
BUN: 53 mg/dL — ABNORMAL HIGH (ref 6–20)
CHLORIDE: 100 mmol/L — AB (ref 101–111)
CO2: 28 mmol/L (ref 22–32)
CREATININE: 1.95 mg/dL — AB (ref 0.61–1.24)
Calcium: 8.2 mg/dL — ABNORMAL LOW (ref 8.9–10.3)
GFR calc non Af Amer: 29 mL/min — ABNORMAL LOW (ref >60)
GFR, EST AFRICAN AMERICAN: 33 mL/min — AB (ref >60)
Glucose, Bld: 86 mg/dL (ref 65–99)
POTASSIUM: 4.2 mmol/L (ref 3.5–5.1)
SODIUM: 135 mmol/L (ref 135–145)

## 2016-05-24 MED ORDER — FUROSEMIDE 10 MG/ML IJ SOLN: 40.0000 mg | Freq: Every day | INTRAMUSCULAR | Status: DC

## 2016-05-24 MED ORDER — FUROSEMIDE 40 MG PO TABS
40.0000 mg | ORAL_TABLET | Freq: Every day | ORAL | Status: DC
  Administered 2016-05-25: 40 mg via ORAL
  Filled 2016-05-24: qty 1

## 2016-05-24 NOTE — Progress Notes (Signed)
Subjective: He says he feels better. No new complaints. No nausea vomiting or diarrhea. His breathing is better. No chest pain.  Objective: Vital signs in last 24 hours: Temp:  [98 F (36.7 C)-98.5 F (36.9 C)] 98 F (36.7 C) (04/08 0515) Pulse Rate:  [69-75] 75 (04/08 0515) Resp:  [18-20] 20 (04/08 0515) BP: (104-116)/(61-68) 104/61 (04/08 0515) SpO2:  [95 %-99 %] 95 % (04/08 0515) Weight:  [76.8 kg (169 lb 5 oz)] 76.8 kg (169 lb 5 oz) (04/08 0515) Weight change: 0.32 kg (11.3 oz) Last BM Date: 05/23/16  Intake/Output from previous day: 04/07 0701 - 04/08 0700 In: 720 [P.O.:720] Out: 2800 [Urine:2800]  PHYSICAL EXAM General appearance: alert, cooperative and no distress Resp: clear to auscultation bilaterally Cardio: His heart is regular he has no edema of the legs GI: soft, non-tender; bowel sounds normal; no masses,  no organomegaly Extremities: extremities normal, atraumatic, no cyanosis or edema Skin warm and dry. He has Foley catheter  Lab Results:  Results for orders placed or performed during the hospital encounter of 05/21/16 (from the past 48 hour(s))  Basic metabolic panel     Status: Abnormal   Collection Time: 05/23/16  5:42 AM  Result Value Ref Range   Sodium 134 (L) 135 - 145 mmol/L   Potassium 3.9 3.5 - 5.1 mmol/L   Chloride 102 101 - 111 mmol/L   CO2 25 22 - 32 mmol/L   Glucose, Bld 95 65 - 99 mg/dL   BUN 50 (H) 6 - 20 mg/dL   Creatinine, Ser 1.83 (H) 0.61 - 1.24 mg/dL   Calcium 8.1 (L) 8.9 - 10.3 mg/dL   GFR calc non Af Amer 31 (L) >60 mL/min   GFR calc Af Amer 36 (L) >60 mL/min    Comment: (NOTE) The eGFR has been calculated using the CKD EPI equation. This calculation has not been validated in all clinical situations. eGFR's persistently <60 mL/min signify possible Chronic Kidney Disease.    Anion gap 7 5 - 15  Basic metabolic panel     Status: Abnormal   Collection Time: 05/24/16  5:44 AM  Result Value Ref Range   Sodium 135 135 - 145  mmol/L   Potassium 4.2 3.5 - 5.1 mmol/L   Chloride 100 (L) 101 - 111 mmol/L   CO2 28 22 - 32 mmol/L   Glucose, Bld 86 65 - 99 mg/dL   BUN 53 (H) 6 - 20 mg/dL   Creatinine, Ser 1.95 (H) 0.61 - 1.24 mg/dL   Calcium 8.2 (L) 8.9 - 10.3 mg/dL   GFR calc non Af Amer 29 (L) >60 mL/min   GFR calc Af Amer 33 (L) >60 mL/min    Comment: (NOTE) The eGFR has been calculated using the CKD EPI equation. This calculation has not been validated in all clinical situations. eGFR's persistently <60 mL/min signify possible Chronic Kidney Disease.    Anion gap 7 5 - 15    ABGS No results for input(s): PHART, PO2ART, TCO2, HCO3 in the last 72 hours.  Invalid input(s): PCO2 CULTURES Recent Results (from the past 240 hour(s))  Urine culture     Status: Abnormal   Collection Time: 05/21/16 12:13 PM  Result Value Ref Range Status   Specimen Description URINE, CLEAN CATCH  Final   Special Requests NONE  Final   Culture (A)  Final    <10,000 COLONIES/mL INSIGNIFICANT GROWTH Performed at [facility], [address] [place],  [address]    Report  Status 05/22/2016 FINAL  Final  Blood Culture (routine x 2)     Status: None (Preliminary result)   Collection Time: 05/21/16 12:17 PM  Result Value Ref Range Status   Specimen Description BLOOD LEFT ARM  Final   Special Requests   Final    BOTTLES DRAWN AEROBIC AND ANAEROBIC Blood Culture adequate volume   Culture NO GROWTH 2 DAYS  Final   Report Status PENDING  Incomplete  Blood Culture (routine x 2)     Status: None (Preliminary result)   Collection Time: 05/21/16 12:20 PM  Result Value Ref Range Status   Specimen Description BLOOD RIGHT HAND  Final   Special Requests   Final    BOTTLES DRAWN AEROBIC ONLY Blood Culture results may not be optimal due to an inadequate volume of blood received in culture bottles   Culture NO GROWTH 2 DAYS  Final   Report Status PENDING  Incomplete   Studies/Results: No results found.  Medications:   Prior to Admission:  Prescriptions Prior to Admission  Medication Sig Dispense Refill Last Dose  . acetaminophen (TYLENOL) 500 MG tablet Take 500 mg by mouth daily.   05/20/2016 at Unknown time  . allopurinol (ZYLOPRIM) 100 MG tablet Take 1 tablet (100 mg total) by mouth daily.   05/20/2016 at Unknown time  . amiodarone (PACERONE) 200 MG tablet Take 100 mg by mouth daily.   05/20/2016 at Unknown time  . apixaban (ELIQUIS) 2.5 MG TABS tablet Take 2.5 mg by mouth 2 (two) times daily.   05/20/2016 at 1830  . atorvastatin (LIPITOR) 80 MG tablet Take 40 mg by mouth daily at 6 PM.   05/20/2016 at Unknown time  . carvedilol (COREG) 3.125 MG tablet Take 1 tablet (3.125 mg total) by mouth 2 (two) times daily with a meal.   05/20/2016 at 1830  . cetirizine (ZYRTEC ALLERGY) 10 MG tablet Take 10 mg by mouth daily as needed for allergies or rhinitis.    unknown  . famotidine (PEPCID) 20 MG tablet Take 20 mg by mouth every evening.    05/20/2016 at Unknown time  . finasteride (PROSCAR) 5 MG tablet Take 5 mg by mouth every evening.    05/20/2016 at Unknown time  . fish oil-omega-3 fatty acids 1000 MG capsule Take 1 g by mouth every morning.    05/20/2016 at Unknown time  . furosemide (LASIX) 20 MG tablet Take 40 mg by mouth.    05/20/2016 at Unknown time  . HYDROcodone-acetaminophen (NORCO/VICODIN) 5-325 MG tablet Take 1 tablet by mouth every 6 (six) hours as needed for severe pain. 120 tablet 0 unknown  . ipratropium-albuterol (DUONEB) 0.5-2.5 (3) MG/3ML SOLN Take 3 mLs by nebulization 3 (three) times daily. 360 mL  05/21/2016 at Unknown time  . levothyroxine (SYNTHROID, LEVOTHROID) 75 MCG tablet Take 75 mcg by mouth daily before breakfast.  5 05/20/2016 at Unknown time  . linaclotide (LINZESS) 72 MCG capsule Take 72 mcg by mouth daily as needed (for constipation).   unknown  . Menthol, Topical Analgesic, (BIOFREEZE) 4 % GEL Apply to right knee for discomfort three times a day prn   05/20/2016 at Unknown time  . Menthol, Topical  Analgesic, (BLUE-EMU MAXIMUM STRENGTH EX) Apply 1 application topically daily. Applied to knees   05/20/2016 at Unknown time  . Multiple Vitamin (MULTIVITAMIN WITH MINERALS) TABS tablet Take 1 tablet by mouth daily.   05/20/2016 at Unknown time  . nitroGLYCERIN (NITROSTAT) 0.4 MG SL tablet Place 1 tablet (0.4 mg total)  under the tongue every 5 (five) minutes x 3 doses as needed for chest pain. 25 tablet 3 unknown  . polyethylene glycol powder (GLYCOLAX/MIRALAX) powder Take 17 g by mouth daily as needed for moderate constipation.    unknown  . sacubitril-valsartan (ENTRESTO) 24-26 MG Take 1 tablet by mouth 2 (two) times daily.   05/20/2016 at Unknown time  . senna-docusate (SENOKOT-S) 8.6-50 MG tablet Take 1 tablet by mouth 2 (two) times daily.   05/20/2016 at Unknown time   Scheduled: . amiodarone  100 mg Oral Daily  . apixaban  2.5 mg Oral BID  . atorvastatin  40 mg Oral q1800  . carvedilol  3.125 mg Oral BID WC  . famotidine  20 mg Oral QPM  . finasteride  5 mg Oral QPM  . furosemide  40 mg Oral Daily  . levothyroxine  75 mcg Oral QAC breakfast  . multivitamin with minerals  1 tablet Oral Daily  . omega-3 acid ethyl esters  1 g Oral q morning - 10a  . senna-docusate  1 tablet Oral BID  . sodium chloride flush  3 mL Intravenous Q12H   Continuous:  LAG:TXMIWO chloride, acetaminophen, nitroGLYCERIN, ondansetron (ZOFRAN) IV, polyethylene glycol, sodium chloride flush  Assesment: He was admitted with acute systolic heart failure. He is doing better. He has acute on chronic renal failure and his renal function has deteriorated a bit likely related to his diuresis. He has a nonischemic cardiomyopathy which is doing well now. He has paroxysmal atrial fibrillation but seems to be in sinus versus paced rhythm right now. He had a hip fracture and he has been minimally ambulatory because of that. Overall he is substantially improved. Principal Problem:   Acute on chronic respiratory failure with hypoxia  (HCC) Active Problems:   Pacemaker-[employer]   CAD (coronary artery disease)   Non-ischemic cardiomyopathy (HCC)   Paroxysmal atrial fibrillation (HCC)   Intertrochanteric fracture of right hip (HCC)   Acute systolic CHF (congestive heart failure) (HCC)   ARF (acute renal failure) (HCC)   Pressure injury of skin    Plan: Discontinue IV diuresis. Switch to by mouth and 40 mg daily of furosemide. He started received IV furosemide this morning so on going to leave the Foley catheter in because he is put out a lot of urine and he has difficulty ambulating. No more Lasix today. Potential discharge tomorrow. Repeat renal function tomorrow to make sure it hasn't deteriorated further    LOS: 3 days   Bennye Nix L 05/24/2016, 10:08 AM

## 2016-05-25 LAB — BASIC METABOLIC PANEL
ANION GAP: 8 (ref 5–15)
BUN: 49 mg/dL — ABNORMAL HIGH (ref 6–20)
CO2: 28 mmol/L (ref 22–32)
Calcium: 8.5 mg/dL — ABNORMAL LOW (ref 8.9–10.3)
Chloride: 99 mmol/L — ABNORMAL LOW (ref 101–111)
Creatinine, Ser: 1.65 mg/dL — ABNORMAL HIGH (ref 0.61–1.24)
GFR calc Af Amer: 41 mL/min — ABNORMAL LOW (ref >60)
GFR calc non Af Amer: 35 mL/min — ABNORMAL LOW (ref >60)
GLUCOSE: 95 mg/dL (ref 65–99)
POTASSIUM: 4.3 mmol/L (ref 3.5–5.1)
Sodium: 135 mmol/L (ref 135–145)

## 2016-05-25 NOTE — Progress Notes (Signed)
PAtient discharged. IV removed and site intact. Patient discharged with personal belongings and home O2 tank.

## 2016-05-25 NOTE — Care Management Note (Signed)
Case Management Note  Patient Details  Name: Connor Diaz MRN: [id number] Date of Birth: 1923-10-20    Expected Discharge Date:       05/25/2016          Expected Discharge Plan:  Home w Home Health Services  In-House Referral:     Discharge planning Services  CM Consult  Post Acute Care Choice:  Home Health, Resumption of Svcs/PTA Provider Choice offered to:  Patient  DME Arranged:  Oxygen DME Agency:  [facility]  HH Arranged:    [facility] Agency:  [facility]  Status of Service:  In process, will continue to follow  If discussed at Long Length of Stay Meetings, dates discussed:    Additional Comments: Patient qualifies for oxygen. Alroy Bailiff of [facility] notified. Portable tank will be delivered to room prior to discharge.  Judithe Keetch, Chrystine Oiler, RN 05/25/2016, 11:41 AM

## 2016-05-25 NOTE — Care Management (Signed)
    Durable Medical Equipment        Start     Ordered   05/25/16 1216  For home use only DME oxygen  Once    Question Answer Comment  Mode or (Route) Nasal cannula   Liters per Minute 2   Frequency Continuous (stationary and portable oxygen unit needed)   Oxygen conserving device Yes   Oxygen delivery system Gas      05/25/16 1216

## 2016-05-25 NOTE — Discharge Summary (Signed)
Physician Discharge Summary  Connor Diaz RPR:[id number] DOB: 05-01-1923 DOA: 05/21/2016  PCP: [name], MD  Admit date: 05/21/2016 Discharge date: 05/25/2016   Recommendations for Outpatient Follow-up:  Patient and wife and family are advised to use 2 L nasal O2 at all times daily with assistant using 4-point walker to progressively increase physical activity to take all medicines as prescribed and to follow-up in my office within 7 day period to assess hemodynamics respiratory status oxygen saturation renal function and electrolytes. He is also cautioned to participate vigorously with physical therapy and we will visited him at his house Discharge Diagnoses:  Principal Problem:   Acute on chronic respiratory failure with hypoxia (HCC) Active Problems:   Pacemaker-St. Jude   CAD (coronary artery disease)   Non-ischemic cardiomyopathy (HCC)   Paroxysmal atrial fibrillation (HCC)   CKD (chronic kidney disease) stage 3, GFR 30-59 ml/min   Intertrochanteric fracture of right hip (HCC)   Acute systolic CHF (congestive heart failure) (HCC)   ARF (acute renal failure) (HCC)   Pressure injury of skin   Discharge Condition: Good  Filed Weights   05/23/16 0627 05/24/16 0515 05/25/16 0548  Weight: 76.5 kg (168 lb 9.7 oz) 76.8 kg (169 lb 5 oz) 76.8 kg (169 lb 5 oz)    History of present illness:  The patient is a 83 year old white male with history of ischemic cardiomyopathy ejection fraction 2530% with three-vessel coronary disease stenting in all 3 vessels with chronic atrial fibrillation currently on anticoagulation with L Quist 2.5 by mouth twice a day he is status post right hip arthroplasty after fracture had a rehabilitation stay of 3 weeks' duration he presented with increasing dyspnea was found to be hypoxic intravascularly volume overloaded without hard evidence of ischemia or infarct he was seen in consultation by cardiology he had aggressive IV diuresis with Lasix 40  by mouth twice a day renal function was monitored carefully continued to improve respiratory status she I out a lot of volume of interstitial fluid and then he had his Foley removed on the day of discharge he is cautioned to take all medicines as prescribed A with physical therapy follow-up in my office within one week's time as well as to wear 2 L nasal O2 24 hours per day  Hospital Course:  See history of present illness above  Procedures:    Consultations:  Cardiology  Discharge Instructions  Discharge Instructions    Discharge instructions    Complete by:  As directed    Discharge patient    Complete by:  As directed    Discharge disposition:  01-Home or Self Care   Discharge patient date:  05/25/2016     Allergies as of 05/25/2016      Reactions   Iodinated Diagnostic Agents Rash, Swelling   Swelling in face, urticaria. Received Isovoc 370 (non-ionic) on 10/24/14.   Isovue [iopamidol] Itching, Swelling   Pt had slight erythema, itching and facial swelling right ear and right lower chin.  Pt sneezed several times after contrast injection.  Pt was monitored for 1 hr post injection and was given water to drink. Dr Gery Pray feels he had a very mild reaction.  Premeds are warranted in this patient.  Thanks, J Bohm      Medication List    STOP taking these medications   acetaminophen 500 MG tablet Commonly known as:  TYLENOL   ZYRTEC ALLERGY 10 MG tablet Generic drug:  cetirizine     TAKE these medications  allopurinol 100 MG tablet Commonly known as:  ZYLOPRIM Take 1 tablet (100 mg total) by mouth daily.   amiodarone 200 MG tablet Commonly known as:  PACERONE Take 100 mg by mouth daily.   atorvastatin 80 MG tablet Commonly known as:  LIPITOR Take 40 mg by mouth daily at 6 PM.   BLUE-EMU MAXIMUM STRENGTH EX Apply 1 application topically daily. Applied to knees   BIOFREEZE 4 % Gel Generic drug:  Menthol (Topical Analgesic) Apply to right knee for discomfort three  times a day prn   carvedilol 3.125 MG tablet Commonly known as:  COREG Take 1 tablet (3.125 mg total) by mouth 2 (two) times daily with a meal.   ELIQUIS 2.5 MG Tabs tablet Generic drug:  apixaban Take 2.5 mg by mouth 2 (two) times daily.   ENTRESTO 24-26 MG Generic drug:  sacubitril-valsartan Take 1 tablet by mouth 2 (two) times daily.   famotidine 20 MG tablet Commonly known as:  PEPCID Take 20 mg by mouth every evening.   finasteride 5 MG tablet Commonly known as:  PROSCAR Take 5 mg by mouth every evening.   fish oil-omega-3 fatty acids 1000 MG capsule Take 1 g by mouth every morning.   furosemide 20 MG tablet Commonly known as:  LASIX Take 40 mg by mouth.   HYDROcodone-acetaminophen 5-325 MG tablet Commonly known as:  NORCO/VICODIN Take 1 tablet by mouth every 6 (six) hours as needed for severe pain.   ipratropium-albuterol 0.5-2.5 (3) MG/3ML Soln Commonly known as:  DUONEB Take 3 mLs by nebulization 3 (three) times daily.   levothyroxine 75 MCG tablet Commonly known as:  SYNTHROID, LEVOTHROID Take 75 mcg by mouth daily before breakfast.   LINZESS 72 MCG capsule Generic drug:  linaclotide Take 72 mcg by mouth daily as needed (for constipation).   multivitamin with minerals Tabs tablet Take 1 tablet by mouth daily.   nitroGLYCERIN 0.4 MG SL tablet Commonly known as:  NITROSTAT Place 1 tablet (0.4 mg total) under the tongue every 5 (five) minutes x 3 doses as needed for chest pain.   polyethylene glycol powder powder Commonly known as:  GLYCOLAX/MIRALAX Take 17 g by mouth daily as needed for moderate constipation.   senna-docusate 8.6-50 MG tablet Commonly known as:  Senokot-S Take 1 tablet by mouth 2 (two) times daily.            Durable Medical Equipment        Start     Ordered   05/25/16 1216  For home use only DME oxygen  Once    Question Answer Comment  Mode or (Route) Nasal cannula   Liters per Minute 2   Frequency Continuous  (stationary and portable oxygen unit needed)   Oxygen conserving device Yes   Oxygen delivery system Gas      05/25/16 1216     Allergies  Allergen Reactions  . Iodinated Diagnostic Agents Rash and Swelling    Swelling in face, urticaria. Received Isovoc 370 (non-ionic) on 10/24/14.  . Isovue [Iopamidol] Itching and Swelling    Pt had slight erythema, itching and facial swelling right ear and right lower chin.  Pt sneezed several times after contrast injection.  Pt was monitored for 1 hr post injection and was given water to drink. Dr Gery Pray feels he had a very mild reaction.  Premeds are warranted in this patient.  Thanks, Gildardo Griffes      The results of significant diagnostics from this hospitalization (including imaging, microbiology, ancillary  and laboratory) are listed below for reference.    Significant Diagnostic Studies: Nm Pulmonary Perf And Vent  Result Date: 05/22/2016 CLINICAL DATA:  Hypoxia, history hypertension, paroxysmal atrial fibrillation, former smoker, stage III chronic kidney disease, systolic heart failure, coronary artery disease post PTCA, non ischemic cardiomyopathy EXAM: NUCLEAR MEDICINE VENTILATION - PERFUSION LUNG SCAN TECHNIQUE: Ventilation images were obtained in multiple projections using inhaled aerosol Tc-32m DTPA. Perfusion images were obtained in multiple projections after intravenous injection of Tc-78m MAA. RADIOPHARMACEUTICALS:  33 mCi Technetium-78m DTPA aerosol inhalation and 4.4 mCi Technetium-43m MAA IV COMPARISON:  Chest radiograph 05/21/2016 FINDINGS: Ventilation: Multiple segmental and subsegmental ventilation defects throughout both lungs. Enlargement of cardiac silhouette. Perfusion: Much better perfusion than ventilation. Subsegmental perfusion defect defects in both lower lobes. Chest radiograph: Enlargement of cardiac silhouette with pulmonary vascular congestion and suspected pulmonary edema. IMPRESSION: Very low probability for pulmonary embolism.  Electronically Signed   By: Ulyses Southward M.D.   On: 05/22/2016 08:56   US Venous Img Lower Bilateral  Result Date: 05/07/2016 CLINICAL DATA:  Bilateral lower extremity pain with right lower extremity edema. Recent right hip surgery. EXAM: BILATERAL LOWER EXTREMITY VENOUS DOPPLER ULTRASOUND TECHNIQUE: Gray-scale sonography with graded compression, as well as color Doppler and duplex ultrasound were performed to evaluate the lower extremity deep venous systems from the level of the common femoral vein and including the common femoral, femoral, profunda femoral, popliteal and calf veins including the posterior tibial, peroneal and gastrocnemius veins when visible. The superficial great saphenous vein was also interrogated. Spectral Doppler was utilized to evaluate flow at rest and with distal augmentation maneuvers in the common femoral, femoral and popliteal veins. COMPARISON:  None. FINDINGS: RIGHT LOWER EXTREMITY Common Femoral Vein: No evidence of thrombus. Normal compressibility, respiratory phasicity and response to augmentation. Saphenofemoral Junction: No evidence of thrombus. Normal compressibility and flow on color Doppler imaging. Profunda Femoral Vein: No evidence of thrombus. Normal compressibility and flow on color Doppler imaging. Femoral Vein: No evidence of thrombus. Normal compressibility, respiratory phasicity and response to augmentation. Popliteal Vein: No evidence of thrombus. Normal compressibility, respiratory phasicity and response to augmentation. Calf Veins: Suboptimally evaluated. Superficial Great Saphenous Vein: No evidence of thrombus. Normal compressibility and flow on color Doppler imaging. Other Findings:  Probable Baker's cyst at 3.7 x 1.5 x 1.7 cm. LEFT LOWER EXTREMITY Common Femoral Vein: No evidence of thrombus. Normal compressibility, respiratory phasicity and response to augmentation. Saphenofemoral Junction: No evidence of thrombus. Normal compressibility and flow on color  Doppler imaging. Profunda Femoral Vein: No evidence of thrombus. Normal compressibility and flow on color Doppler imaging. Femoral Vein: No evidence of thrombus. Normal compressibility, respiratory phasicity and response to augmentation. Popliteal Vein: No evidence of thrombus. Normal compressibility, respiratory phasicity and response to augmentation. Calf Veins: Not well evaluated. Superficial Great Saphenous Vein: No evidence of thrombus. Normal compressibility and flow on color Doppler imaging. Other Findings:  Probable Baker's cyst at 1.8 x 1.3 x 2.6 cm. IMPRESSION: 1. No evidence of deep venous thrombosis. Calf veins not well evaluated. 2. Bilateral presumed Baker's cyst. Electronically Signed   By: Jeronimo Greaves M.D.   On: 05/07/2016 16:44   US Venous Img Lower Unilateral Right  Result Date: 05/21/2016 CLINICAL DATA:  Right lower extremity edema.  Recent hip fracture EXAM: RIGHT LOWER EXTREMITY VENOUS DUPLEX ULTRASOUND TECHNIQUE: Gray-scale sonography with graded compression, as well as color Doppler and duplex ultrasound were performed to evaluate the right lower extremity deep venous system from the level of the common  femoral vein and including the common femoral, femoral, profunda femoral, popliteal and calf veins including the posterior tibial, peroneal and gastrocnemius veins when visible. The superficial great saphenous vein was also interrogated. Spectral Doppler was utilized to evaluate flow at rest and with distal augmentation maneuvers in the common femoral, femoral and popliteal veins. COMPARISON:  None. FINDINGS: Contralateral Common Femoral Vein: Respiratory phasicity is normal and symmetric with the symptomatic side. No evidence of thrombus. Normal compressibility. Common Femoral Vein: No evidence of thrombus. Normal compressibility, respiratory phasicity and response to augmentation. Saphenofemoral Junction: No evidence of thrombus. Normal compressibility and flow on color Doppler imaging.  Profunda Femoral Vein: No evidence of thrombus. Normal compressibility and flow on color Doppler imaging. Femoral Vein: No evidence of thrombus. Normal compressibility, respiratory phasicity and response to augmentation. Popliteal Vein: No evidence of thrombus. Normal compressibility, respiratory phasicity and response to augmentation. Calf Veins: No evidence of thrombus. Normal compressibility and flow on color Doppler imaging. Superficial Great Saphenous Vein: No evidence of thrombus. Normal compressibility and flow on color Doppler imaging. Venous Reflux:  None. Other Findings: There is a multi septated popliteal cyst in the popliteal fossa measuring 4.2 x 1.9 x 3.0 cm. IMPRESSION: No evidence of right lower extremity deep venous thrombosis. Left common femoral vein also patent. Complex Baker cyst in the right popliteal fossa measuring 4.2 x 1.9 x 3.0 cm. Electronically Signed   By: Bretta Bang III M.D.   On: 05/21/2016 13:08   Dg Chest Portable 1 View  Result Date: 05/21/2016 CLINICAL DATA:  Shortness of breath for 3 days, former smoker EXAM: PORTABLE CHEST 1 VIEW COMPARISON:  04/17/2016 FINDINGS: Cardiomediastinal silhouette is stable. Mild reticular interstitial prominence bilaterally. Mild interstitial edema cannot be excluded. Stable streaky bilateral basilar atelectasis or scarring. No segmental infiltrate. Double lead cardiac pacemaker is unchanged in position. IMPRESSION: Reticular mild interstitial prominence bilateral suspicious for mild interstitial edema. Stable streaky bilateral basilar atelectasis or scarring. No segmental infiltrate. Electronically Signed   By: Natasha Mead M.D.   On: 05/21/2016 12:10   Dg Hip Unilat With Pelvis 2-3 Views Right  Result Date: 05/01/2016 CLINICAL DATA:  Right hip pain, fall EXAM: DG HIP (WITH OR WITHOUT PELVIS) 2-3V RIGHT COMPARISON:  04/14/2016, 04/19/2016 FINDINGS: Extensive vascular calcifications in the pelvis and proximal thighs. Pubic symphysis  appears intact. SI joint arthritis. Interval intramedullary rod fixation of previously noted right intertrochanteric fracture. Displaced lesser trochanteric fracture is similar compared to prior. Hardware appears intact. No definite acute fracture is seen. There is soft tissue swelling over the lateral hip. IMPRESSION: 1. Intramedullary rod fixation of the right femur across prior intertrochanteric fracture deformity. No definite acute osseous abnormality. 2. Extensive vascular calcifications. Electronically Signed   By: Jasmine Pang M.D.   On: 05/01/2016 17:39    Microbiology: Recent Results (from the past 240 hour(s))  Urine culture     Status: Abnormal   Collection Time: 05/21/16 12:13 PM  Result Value Ref Range Status   Specimen Description URINE, CLEAN CATCH  Final   Special Requests NONE  Final   Culture (A)  Final    <10,000 COLONIES/mL INSIGNIFICANT GROWTH Performed at [facility], [address], [place], [place] [address]    Report Status 05/22/2016 FINAL  Final  Blood Culture (routine x 2)     Status: None (Preliminary result)   Collection Time: 05/21/16 12:17 PM  Result Value Ref Range Status   Specimen Description BLOOD LEFT ARM  Final   Special Requests  Final    BOTTLES DRAWN AEROBIC AND ANAEROBIC Blood Culture adequate volume   Culture NO GROWTH 4 DAYS  Final   Report Status PENDING  Incomplete  Blood Culture (routine x 2)     Status: None (Preliminary result)   Collection Time: 05/21/16 12:20 PM  Result Value Ref Range Status   Specimen Description BLOOD RIGHT HAND  Final   Special Requests   Final    BOTTLES DRAWN AEROBIC ONLY Blood Culture results may not be optimal due to an inadequate volume of blood received in culture bottles   Culture NO GROWTH 4 DAYS  Final   Report Status PENDING  Incomplete     Labs: Basic Metabolic Panel:  Recent Labs Lab 05/21/16 1145 05/22/16 0432 05/23/16 0542 05/24/16 0544 05/25/16 0602  NA 135 132* 134* 135 135  K  5.0 4.5 3.9 4.2 4.3  CL 100* 101 102 100* 99*  CO2 25 22 25 28 28   GLUCOSE 96 103* 95 86 95  BUN 48* 47* 50* 53* 49*  CREATININE 1.78* 1.83* 1.83* 1.95* 1.65*  CALCIUM 9.1 8.4* 8.1* 8.2* 8.5*  MG  --  2.0  --   --   --    Liver Function Tests:  Recent Labs Lab 05/21/16 1145  AST 20  ALT 9*  ALKPHOS 101  BILITOT 0.8  PROT 6.4*  ALBUMIN 3.1*   No results for input(s): LIPASE, AMYLASE in the last 168 hours. No results for input(s): AMMONIA in the last 168 hours. CBC:  Recent Labs Lab 05/21/16 1145  WBC 10.3  HGB 12.5*  HCT 37.1*  MCV 102.8*  PLT 330   Cardiac Enzymes:  Recent Labs Lab 05/21/16 1145  TROPONINI <0.03   BNP: BNP (last 3 results)  Recent Labs  04/22/16 0929 05/21/16 1140  BNP 596.7* 962.0*    ProBNP (last 3 results) No results for input(s): PROBNP in the last 8760 hours.  CBG: No results for input(s): GLUCAP in the last 168 hours.     Signed:  Mae Denunzio Judie Petit  [facility] Pager: [phone] 05/25/2016, 12:22 PM

## 2016-05-25 NOTE — Progress Notes (Signed)
SATURATION QUALIFICATIONS: (This note is used to comply with regulatory documentation for home oxygen)  Patient Saturations on Room Air at Rest = 80%  Patient Saturations on Room Air while Ambulating = 77%  Patient Saturations on 2 Liters of oxygen while Ambulating = 92%

## 2016-05-26 ENCOUNTER — Ambulatory Visit (INDEPENDENT_AMBULATORY_CARE_PROVIDER_SITE_OTHER): Payer: Medicare Other | Admitting: *Deleted

## 2016-05-26 DIAGNOSIS — R001 Bradycardia, unspecified: Secondary | ICD-10-CM | POA: Diagnosis not present

## 2016-05-26 LAB — CULTURE, BLOOD (ROUTINE X 2)
CULTURE: NO GROWTH
CULTURE: NO GROWTH
Special Requests: ADEQUATE

## 2016-05-26 NOTE — Progress Notes (Signed)
Remote pacemaker transmission.   

## 2016-05-27 ENCOUNTER — Encounter: Payer: Self-pay | Admitting: Cardiology

## 2016-05-29 LAB — CUP PACEART REMOTE DEVICE CHECK
Battery Remaining Longevity: 77 mo
Battery Remaining Percentage: 65 %
Brady Statistic AP VP Percent: 88 %
Brady Statistic AP VS Percent: 1 %
Brady Statistic AS VP Percent: 12 %
Brady Statistic AS VS Percent: 1 %
Date Time Interrogation Session: 20180410003227
Implantable Lead Implant Date: 20131004
Implantable Lead Location: 753859
Implantable Lead Model: 1948
Lead Channel Impedance Value: 380 Ohm
Lead Channel Pacing Threshold Amplitude: 0.75 V
Lead Channel Pacing Threshold Pulse Width: 0.4 ms
Lead Channel Sensing Intrinsic Amplitude: 10 mV
Lead Channel Sensing Intrinsic Amplitude: 5 mV
Lead Channel Setting Pacing Amplitude: 2.5 V
Lead Channel Setting Pacing Pulse Width: 0.4 ms
MDC IDC LEAD IMPLANT DT: 20131004
MDC IDC LEAD LOCATION: 753860
MDC IDC MSMT BATTERY VOLTAGE: 2.9 V
MDC IDC MSMT LEADCHNL RA PACING THRESHOLD AMPLITUDE: 0.375 V
MDC IDC MSMT LEADCHNL RA PACING THRESHOLD PULSEWIDTH: 0.4 ms
MDC IDC MSMT LEADCHNL RV IMPEDANCE VALUE: 540 Ohm
MDC IDC PG IMPLANT DT: 20131004
MDC IDC PG SERIAL: 7394618
MDC IDC SET LEADCHNL RA PACING AMPLITUDE: 1.375
MDC IDC SET LEADCHNL RV SENSING SENSITIVITY: 2 mV
MDC IDC STAT BRADY RA PERCENT PACED: 23 %
MDC IDC STAT BRADY RV PERCENT PACED: 76 %

## 2016-06-24 ENCOUNTER — Encounter: Payer: Self-pay | Admitting: Internal Medicine

## 2016-07-07 ENCOUNTER — Other Ambulatory Visit (HOSPITAL_COMMUNITY)
Admission: RE | Admit: 2016-07-07 | Discharge: 2016-07-07 | Disposition: A | Discharge status: Home / Self Care | Payer: Medicare Other | Source: Ambulatory Visit | Attending: Family Medicine | Admitting: Family Medicine

## 2016-07-07 ENCOUNTER — Ambulatory Visit (HOSPITAL_COMMUNITY)
Admission: RE | Admit: 2016-07-07 | Discharge: 2016-07-07 | Disposition: A | Discharge status: Home / Self Care | Payer: Medicare Other | Source: Ambulatory Visit | Attending: Family Medicine | Admitting: Family Medicine

## 2016-07-07 ENCOUNTER — Other Ambulatory Visit (HOSPITAL_COMMUNITY): Payer: Self-pay | Admitting: Family Medicine

## 2016-07-07 DIAGNOSIS — D510 Vitamin B12 deficiency anemia due to intrinsic factor deficiency: Secondary | ICD-10-CM | POA: Insufficient documentation

## 2016-07-07 DIAGNOSIS — E559 Vitamin D deficiency, unspecified: Secondary | ICD-10-CM | POA: Diagnosis not present

## 2016-07-07 DIAGNOSIS — E784 Other hyperlipidemia: Secondary | ICD-10-CM | POA: Diagnosis not present

## 2016-07-07 DIAGNOSIS — Z125 Encounter for screening for malignant neoplasm of prostate: Secondary | ICD-10-CM | POA: Insufficient documentation

## 2016-07-07 DIAGNOSIS — E8770 Fluid overload, unspecified: Secondary | ICD-10-CM | POA: Insufficient documentation

## 2016-07-07 DIAGNOSIS — I10 Essential (primary) hypertension: Secondary | ICD-10-CM | POA: Diagnosis not present

## 2016-07-07 DIAGNOSIS — R918 Other nonspecific abnormal finding of lung field: Secondary | ICD-10-CM | POA: Diagnosis not present

## 2016-07-07 DIAGNOSIS — R5383 Other fatigue: Secondary | ICD-10-CM | POA: Insufficient documentation

## 2016-07-07 DIAGNOSIS — J849 Interstitial pulmonary disease, unspecified: Secondary | ICD-10-CM | POA: Insufficient documentation

## 2016-07-07 LAB — LIPID PANEL
Cholesterol: 107 mg/dL (ref 0–200)
HDL: 53 mg/dL (ref >40)
LDL CALC: 45 mg/dL (ref 0–99)
Total CHOL/HDL Ratio: 2 RATIO
Triglycerides: 47 mg/dL (ref <150)
VLDL: 9 mg/dL (ref 0–40)

## 2016-07-07 LAB — COMPREHENSIVE METABOLIC PANEL
ALBUMIN: 3.3 g/dL — AB (ref 3.5–5.0)
ALK PHOS: 73 U/L (ref 38–126)
ALT: 8 U/L — AB (ref 17–63)
AST: 21 U/L (ref 15–41)
Anion gap: 8 (ref 5–15)
BILIRUBIN TOTAL: 0.9 mg/dL (ref 0.3–1.2)
BUN: 34 mg/dL — ABNORMAL HIGH (ref 6–20)
CALCIUM: 9.1 mg/dL (ref 8.9–10.3)
CO2: 26 mmol/L (ref 22–32)
CREATININE: 1.36 mg/dL — AB (ref 0.61–1.24)
Chloride: 101 mmol/L (ref 101–111)
GFR calc Af Amer: 51 mL/min — ABNORMAL LOW (ref >60)
GFR calc non Af Amer: 44 mL/min — ABNORMAL LOW (ref >60)
GLUCOSE: 82 mg/dL (ref 65–99)
Potassium: 4.5 mmol/L (ref 3.5–5.1)
SODIUM: 135 mmol/L (ref 135–145)
Total Protein: 6.8 g/dL (ref 6.5–8.1)

## 2016-07-07 LAB — CBC
HEMATOCRIT: 43 % (ref 39.0–52.0)
HEMOGLOBIN: 14 g/dL (ref 13.0–17.0)
MCH: 33.9 pg (ref 26.0–34.0)
MCHC: 32.6 g/dL (ref 30.0–36.0)
MCV: 104.1 fL — ABNORMAL HIGH (ref 78.0–100.0)
Platelets: 220 10*3/uL (ref 150–400)
RBC: 4.13 MIL/uL — AB (ref 4.22–5.81)
RDW: 17.4 % — ABNORMAL HIGH (ref 11.5–15.5)
WBC: 10.5 10*3/uL (ref 4.0–10.5)

## 2016-07-07 LAB — TSH: TSH: 11.78 u[IU]/mL — AB (ref 0.350–4.500)

## 2016-07-07 LAB — T4, FREE: FREE T4: 1 ng/dL (ref 0.61–1.12)

## 2016-07-07 LAB — PSA: PSA: 1 ng/mL (ref 0.00–4.00)

## 2016-07-08 LAB — VITAMIN D 25 HYDROXY (VIT D DEFICIENCY, FRACTURES): Vit D, 25-Hydroxy: 47.4 ng/mL (ref 30.0–100.0)

## 2016-07-15 ENCOUNTER — Ambulatory Visit (INDEPENDENT_AMBULATORY_CARE_PROVIDER_SITE_OTHER): Payer: Medicare Other | Admitting: Internal Medicine

## 2016-07-15 ENCOUNTER — Encounter: Payer: Self-pay | Admitting: Internal Medicine

## 2016-07-15 VITALS — BP 98/62 | HR 76 | Ht 70.0 in | Wt 167.0 lb

## 2016-07-15 DIAGNOSIS — Z950 Presence of cardiac pacemaker: Secondary | ICD-10-CM

## 2016-07-15 DIAGNOSIS — I10 Essential (primary) hypertension: Secondary | ICD-10-CM | POA: Diagnosis not present

## 2016-07-15 DIAGNOSIS — I5042 Chronic combined systolic (congestive) and diastolic (congestive) heart failure: Secondary | ICD-10-CM | POA: Diagnosis not present

## 2016-07-15 DIAGNOSIS — R001 Bradycardia, unspecified: Secondary | ICD-10-CM

## 2016-07-15 DIAGNOSIS — I481 Persistent atrial fibrillation: Secondary | ICD-10-CM | POA: Diagnosis not present

## 2016-07-15 DIAGNOSIS — I2510 Atherosclerotic heart disease of native coronary artery without angina pectoris: Secondary | ICD-10-CM

## 2016-07-15 DIAGNOSIS — I428 Other cardiomyopathies: Secondary | ICD-10-CM

## 2016-07-15 DIAGNOSIS — I5041 Acute combined systolic (congestive) and diastolic (congestive) heart failure: Secondary | ICD-10-CM

## 2016-07-15 DIAGNOSIS — I4819 Other persistent atrial fibrillation: Secondary | ICD-10-CM

## 2016-07-15 LAB — BASIC METABOLIC PANEL
BUN / CREAT RATIO: 22 (ref 10–24)
BUN: 29 mg/dL (ref 10–36)
CALCIUM: 8.5 mg/dL — AB (ref 8.6–10.2)
CHLORIDE: 101 mmol/L (ref 96–106)
CO2: 22 mmol/L (ref 18–29)
CREATININE: 1.33 mg/dL — AB (ref 0.76–1.27)
GFR calc Af Amer: 54 mL/min/{1.73_m2} — ABNORMAL LOW (ref >59)
GFR calc non Af Amer: 47 mL/min/{1.73_m2} — ABNORMAL LOW (ref >59)
GLUCOSE: 77 mg/dL (ref 65–99)
Potassium: 4.5 mmol/L (ref 3.5–5.2)
Sodium: 137 mmol/L (ref 134–144)

## 2016-07-15 MED ORDER — FUROSEMIDE 20 MG PO TABS: ORAL_TABLET | ORAL | 3 refills | Status: DC

## 2016-07-15 NOTE — Progress Notes (Signed)
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 He recent had mechanical fall and hip fracture requiring surgery.  He was discharged on 04/23/16 to [facility] for PT.  He developed subsequent CHF symptoms and was admitted to [facility] 05/21/16.  He was also noted have to have worsening renal function at that time.  He improved with mild diuresis.  His care giver reports that he has had difficulty with SOB and edema for several months.  He has been in persistent afib since August.  He was started on amiodarone by Dr [name] but has not been cardioverted.  Today, he denies symptoms of palpitations, chest pain,  dizziness, presyncope, or syncope.  The patient is otherwise without complaint today.   Past Medical History:  Diagnosis Date  . AAA (abdominal aortic aneurysm) (HCC)    a. 02/2009 U/S 4.4 x 4.5 cm  . BPH (benign prostatic hyperplasia)   . CKD (chronic kidney disease), stage III   . Coronary artery disease   . DJD (degenerative joint disease)   . History of tobacco abuse    a. roughly 36 pack years, quit @ age 89.  Marland Kitchen Hypertension   . Hypothyroidism   . Non-ischemic cardiomyopathy (HCC)   . PAF (paroxysmal atrial fibrillation) (HCC)    a. noted 11/2011  . Pancreatitis    a. 10/2011 - managed conservatively @ home.  . Symptomatic bradycardia    a. s/p STJ dual chamber PPM   . Systolic heart failure (HCC)    02/2013   Past Surgical History:  Procedure Laterality Date  . CATARACT EXTRACTION W/ INTRAOCULAR LENS  IMPLANT, BILATERAL Bilateral 1980's  . CORONARY ANGIOGRAM  11/19/2011   Procedure: CORONARY ANGIOGRAM;  Surgeon: [name], MD;  Location: [facility] CATH LAB;  Service: Cardiovascular;;  . CORONARY ANGIOPLASTY WITH STENT PLACEMENT  12/22/2011    LAD & CIRCUMFLEX  . EYE SURGERY    . INTRAMEDULLARY (IM) NAIL INTERTROCHANTERIC Right 04/19/2016   Procedure: INTRAMEDULLARY  (IM) NAIL INTERTROCHANTRIC;  Surgeon: Sheral Apley, MD;  Location: [facility] OR;  Service: Orthopedics;  Laterality: Right;  . PERCUTANEOUS CORONARY STENT INTERVENTION (PCI-S) N/A 12/22/2011   Procedure: PERCUTANEOUS CORONARY STENT INTERVENTION (PCI-S);  Surgeon: [name], MD;  Location: [facility] CATH LAB;  Service: Cardiovascular;  Laterality: N/A;  . PERMANENT PACEMAKER INSERTION N/A 11/20/2011   STJ dual chamber PPM implanted by [name] for symptomatic bradycardia   . TONSILLECTOMY  ~ 1933    ROS- all systems are reviewed and negative except as per HPI above  Current Outpatient Prescriptions  Medication Sig Dispense Refill  . allopurinol (ZYLOPRIM) 100 MG tablet Take 1 tablet (100 mg total) by mouth daily.    Marland Kitchen amiodarone (PACERONE) 200 MG tablet Take 100 mg by mouth daily.    Marland Kitchen apixaban (ELIQUIS) 2.5 MG TABS tablet Take 2.5 mg by mouth 2 (two) times daily.    Marland Kitchen atorvastatin (LIPITOR) 80 MG tablet Take 80 mg by mouth daily at 6 PM.     . carvedilol (COREG) 3.125 MG tablet Take 1 tablet (3.125 mg total) by mouth 2 (two) times daily with a meal.    . famotidine (PEPCID) 20 MG tablet Take 20 mg by mouth every evening.     . fish oil-omega-3 fatty acids 1000 MG capsule Take 1 g by mouth every morning.     . furosemide (LASIX) 20 MG tablet  Take 20 mg by mouth 2 (two) times daily.     Marland Kitchen HYDROcodone-acetaminophen (NORCO/VICODIN) 5-325 MG tablet Take 1 tablet by mouth every 6 (six) hours as needed for severe pain. 120 tablet 0  . ipratropium-albuterol (DUONEB) 0.5-2.5 (3) MG/3ML SOLN Take 3 mLs by nebulization 3 (three) times daily. 360 mL   . levothyroxine (SYNTHROID, LEVOTHROID) 75 MCG tablet Take 75 mcg by mouth daily before breakfast.  5  . linaclotide (LINZESS) 72 MCG capsule Take 72 mcg by mouth daily as needed (for constipation).    . Menthol, Topical Analgesic, (BIOFREEZE) 4 % GEL as directed. Apply to right knee for discomfort three times a day as needed    . Menthol, Topical Analgesic,  (BLUE-EMU MAXIMUM STRENGTH EX) Apply 1 application topically daily. Applied to knees    . Multiple Vitamin (MULTIVITAMIN WITH MINERALS) TABS tablet Take 1 tablet by mouth daily.    . nitroGLYCERIN (NITROSTAT) 0.4 MG SL tablet Place 1 tablet (0.4 mg total) under the tongue every 5 (five) minutes x 3 doses as needed for chest pain. 25 tablet 3  . polyethylene glycol powder (GLYCOLAX/MIRALAX) powder Take 17 g by mouth daily as needed for moderate constipation.     . senna-docusate (SENOKOT-S) 8.6-50 MG tablet Take 1 tablet by mouth 2 (two) times daily.    . tamsulosin (FLOMAX) 0.4 MG CAPS capsule Take 0.4 mg by mouth daily.     No current facility-administered medications for this visit.     Physical Exam: Vitals:   07/15/16 1055  BP: 98/62  Pulse: 76  SpO2: 97%  Weight: 167 lb (75.8 kg)  Height: 5\' 10"  (1.778 m)    GEN- The patient is elderly and frail appearing, alert and oriented x 3 today.   Head- normocephalic, atraumatic Eyes-  Sclera clear, conjunctiva pink Ears- hearing intact Oropharynx- clear Lungs- bibasilar rales, normal work of breathing Chest- pacemaker pocket is well healed Heart- Regular rate and rhythm (paced) GI- soft, NT, ND, + BS Extremities- no clubbing, cyanosis, +2 edema  Pacemaker interrogation- personally reviewed in detail today,  See PACEART report  Assessment and Plan:  1. Symptomatic sinus bradycardia Normal pacemaker function See Pace Art report No changes today  2. Acute on chronic systolic dysfunction Conservative approach is advised given advanced age Likely exacerbated by afib (see below) Will check bmet today Management is complicated by renal failure Will increase lasix to 40mg  Qam and 20mg  qpm (currently 20mg  BID) I have advised that he discuss cardioversion with Dr [name]  3. Persistent atrial fibrillation Stable on eliquis Started on amiodarone by Dr [name] but has not yet had cardioversion I have advised that he discuss  cardioversion with Dr [name] when he sees him next week  4. HTN Stable No change required today  5. CAD No ischemic symptoms No changes today  Merlin Return in 6 months to [facility] NP  Other cardiology care per Dr [name] (sees him next week)  Hillis Range MD, [facility] 07/15/2016 11:16 AM

## 2016-07-15 NOTE — Patient Instructions (Signed)
Medication Instructions:  INCREASE Furosemide (Lasix) to 40 mg every morning and 20 mg every afternoon   Labwork: TODAY - basic metabolic panel   Testing/Procedures: None Ordered   Follow-Up: Remote monitoring is used to monitor your Pacemaker from home. This monitoring reduces the number of office visits required to check your device to one time per year. It allows [place] to keep an eye on the functioning of your device to ensure it is working properly. You are scheduled for a device check from home on 10/14/16. You may send your transmission at any time that day. If you have a wireless device, the transmission will be sent automatically. After your physician reviews your transmission, you will receive a postcard with your next transmission date.  Your physician wants you to follow-up in: 6 months with Gypsy Balsam, NP.  You will receive a reminder letter in the mail two months in advance. If you don't receive a letter, please call our office to schedule the follow-up appointment.   If you need a refill on your cardiac medications before your next appointment, please call your pharmacy.   Thank you for choosing [facility]! Eligha Bridegroom, RN [phone]

## 2016-07-17 LAB — CUP PACEART INCLINIC DEVICE CHECK
Battery Voltage: 2.9 V
Implantable Lead Location: 753860
Implantable Lead Model: 1948
Implantable Pulse Generator Implant Date: 20131004
Lead Channel Pacing Threshold Amplitude: 0.5 V
Lead Channel Pacing Threshold Amplitude: 0.75 V
Lead Channel Pacing Threshold Pulse Width: 0.4 ms
Lead Channel Sensing Intrinsic Amplitude: 11.4 mV
Lead Channel Setting Pacing Amplitude: 2.5 V
Lead Channel Setting Pacing Pulse Width: 0.4 ms
Lead Channel Setting Sensing Sensitivity: 2 mV
MDC IDC LEAD IMPLANT DT: 20131004
MDC IDC LEAD IMPLANT DT: 20131004
MDC IDC LEAD LOCATION: 753859
MDC IDC MSMT LEADCHNL RA IMPEDANCE VALUE: 400 Ohm
MDC IDC MSMT LEADCHNL RA SENSING INTR AMPL: 1.7 mV
MDC IDC MSMT LEADCHNL RV IMPEDANCE VALUE: 562.5 Ohm
MDC IDC MSMT LEADCHNL RV PACING THRESHOLD PULSEWIDTH: 0.4 ms
MDC IDC SESS DTM: 20180530145451
MDC IDC SET LEADCHNL RA PACING AMPLITUDE: 1.375
MDC IDC STAT BRADY RA PERCENT PACED: 20 %
MDC IDC STAT BRADY RV PERCENT PACED: 74 %
Pulse Gen Serial Number: 7394618

## 2016-07-20 ENCOUNTER — Encounter (HOSPITAL_COMMUNITY): Payer: Self-pay | Admitting: Orthopedic Surgery

## 2016-07-20 NOTE — Addendum Note (Signed)
Addendum  created 07/20/16 1205 by Steel Kerney, MD   Sign clinical note    

## 2016-10-04 ENCOUNTER — Emergency Department (HOSPITAL_COMMUNITY): Payer: Medicare Other

## 2016-10-04 ENCOUNTER — Encounter (HOSPITAL_COMMUNITY): Payer: Self-pay | Admitting: *Deleted

## 2016-10-04 ENCOUNTER — Observation Stay (HOSPITAL_COMMUNITY)
Admission: EM | Admit: 2016-10-04 | Discharge: 2016-10-05 | Disposition: A | Discharge status: Home / Self Care | Payer: Medicare Other | Attending: Family Medicine | Admitting: Family Medicine

## 2016-10-04 DIAGNOSIS — N183 Chronic kidney disease, stage 3 unspecified: Secondary | ICD-10-CM | POA: Diagnosis present

## 2016-10-04 DIAGNOSIS — N179 Acute kidney failure, unspecified: Secondary | ICD-10-CM | POA: Diagnosis not present

## 2016-10-04 DIAGNOSIS — N400 Enlarged prostate without lower urinary tract symptoms: Secondary | ICD-10-CM

## 2016-10-04 DIAGNOSIS — I428 Other cardiomyopathies: Secondary | ICD-10-CM

## 2016-10-04 DIAGNOSIS — E039 Hypothyroidism, unspecified: Secondary | ICD-10-CM | POA: Insufficient documentation

## 2016-10-04 DIAGNOSIS — I480 Paroxysmal atrial fibrillation: Secondary | ICD-10-CM | POA: Diagnosis not present

## 2016-10-04 DIAGNOSIS — I714 Abdominal aortic aneurysm, without rupture, unspecified: Secondary | ICD-10-CM

## 2016-10-04 DIAGNOSIS — Z79899 Other long term (current) drug therapy: Secondary | ICD-10-CM | POA: Insufficient documentation

## 2016-10-04 DIAGNOSIS — E440 Moderate protein-calorie malnutrition: Secondary | ICD-10-CM

## 2016-10-04 DIAGNOSIS — N020 Recurrent and persistent hematuria with minor glomerular abnormality: Secondary | ICD-10-CM

## 2016-10-04 DIAGNOSIS — E785 Hyperlipidemia, unspecified: Secondary | ICD-10-CM | POA: Diagnosis not present

## 2016-10-04 DIAGNOSIS — R0602 Shortness of breath: Secondary | ICD-10-CM

## 2016-10-04 DIAGNOSIS — Z955 Presence of coronary angioplasty implant and graft: Secondary | ICD-10-CM | POA: Insufficient documentation

## 2016-10-04 DIAGNOSIS — Z950 Presence of cardiac pacemaker: Secondary | ICD-10-CM | POA: Diagnosis not present

## 2016-10-04 DIAGNOSIS — R319 Hematuria, unspecified: Secondary | ICD-10-CM | POA: Diagnosis not present

## 2016-10-04 DIAGNOSIS — I5041 Acute combined systolic (congestive) and diastolic (congestive) heart failure: Secondary | ICD-10-CM

## 2016-10-04 DIAGNOSIS — Z7901 Long term (current) use of anticoagulants: Secondary | ICD-10-CM | POA: Diagnosis not present

## 2016-10-04 DIAGNOSIS — J9621 Acute and chronic respiratory failure with hypoxia: Secondary | ICD-10-CM

## 2016-10-04 DIAGNOSIS — I5022 Chronic systolic (congestive) heart failure: Secondary | ICD-10-CM | POA: Diagnosis not present

## 2016-10-04 DIAGNOSIS — I130 Hypertensive heart and chronic kidney disease with heart failure and stage 1 through stage 4 chronic kidney disease, or unspecified chronic kidney disease: Secondary | ICD-10-CM | POA: Diagnosis not present

## 2016-10-04 DIAGNOSIS — I255 Ischemic cardiomyopathy: Secondary | ICD-10-CM | POA: Diagnosis not present

## 2016-10-04 DIAGNOSIS — R310 Gross hematuria: Secondary | ICD-10-CM

## 2016-10-04 DIAGNOSIS — J9611 Chronic respiratory failure with hypoxia: Secondary | ICD-10-CM

## 2016-10-04 DIAGNOSIS — I2510 Atherosclerotic heart disease of native coronary artery without angina pectoris: Secondary | ICD-10-CM | POA: Diagnosis not present

## 2016-10-04 DIAGNOSIS — R001 Bradycardia, unspecified: Secondary | ICD-10-CM

## 2016-10-04 DIAGNOSIS — N170 Acute kidney failure with tubular necrosis: Secondary | ICD-10-CM

## 2016-10-04 DIAGNOSIS — Z87891 Personal history of nicotine dependence: Secondary | ICD-10-CM | POA: Diagnosis not present

## 2016-10-04 DIAGNOSIS — L03116 Cellulitis of left lower limb: Secondary | ICD-10-CM

## 2016-10-04 DIAGNOSIS — I5021 Acute systolic (congestive) heart failure: Secondary | ICD-10-CM

## 2016-10-04 DIAGNOSIS — J180 Bronchopneumonia, unspecified organism: Secondary | ICD-10-CM

## 2016-10-04 LAB — BASIC METABOLIC PANEL
ANION GAP: 9 (ref 5–15)
BUN: 47 mg/dL — AB (ref 6–20)
CALCIUM: 9 mg/dL (ref 8.9–10.3)
CO2: 29 mmol/L (ref 22–32)
Chloride: 104 mmol/L (ref 101–111)
Creatinine, Ser: 2.06 mg/dL — ABNORMAL HIGH (ref 0.61–1.24)
GFR calc Af Amer: 31 mL/min — ABNORMAL LOW (ref >60)
GFR calc non Af Amer: 27 mL/min — ABNORMAL LOW (ref >60)
GLUCOSE: 76 mg/dL (ref 65–99)
Potassium: 4.6 mmol/L (ref 3.5–5.1)
Sodium: 142 mmol/L (ref 135–145)

## 2016-10-04 LAB — URINALYSIS, ROUTINE W REFLEX MICROSCOPIC

## 2016-10-04 LAB — TYPE AND SCREEN
ABO/RH(D): A POS
Antibody Screen: NEGATIVE

## 2016-10-04 LAB — CBC WITH DIFFERENTIAL/PLATELET
BASOS ABS: 0 10*3/uL (ref 0.0–0.1)
Basophils Relative: 0 %
EOS PCT: 1 %
Eosinophils Absolute: 0.1 10*3/uL (ref 0.0–0.7)
HEMATOCRIT: 35.8 % — AB (ref 39.0–52.0)
Hemoglobin: 12 g/dL — ABNORMAL LOW (ref 13.0–17.0)
LYMPHS ABS: 1.6 10*3/uL (ref 0.7–4.0)
Lymphocytes Relative: 21 %
MCH: 33.8 pg (ref 26.0–34.0)
MCHC: 33.5 g/dL (ref 30.0–36.0)
MCV: 100.8 fL — AB (ref 78.0–100.0)
MONO ABS: 0.9 10*3/uL (ref 0.1–1.0)
Monocytes Relative: 12 %
NEUTROS ABS: 5 10*3/uL (ref 1.7–7.7)
Neutrophils Relative %: 66 %
PLATELETS: 132 10*3/uL — AB (ref 150–400)
RBC: 3.55 MIL/uL — ABNORMAL LOW (ref 4.22–5.81)
RDW: 18.2 % — AB (ref 11.5–15.5)
WBC: 7.6 10*3/uL (ref 4.0–10.5)

## 2016-10-04 LAB — URINALYSIS, MICROSCOPIC (REFLEX)

## 2016-10-04 MED ORDER — OMEGA-3 FATTY ACIDS 1000 MG PO CAPS: 1.0000 g | ORAL_CAPSULE | Freq: Every morning | ORAL | Status: DC

## 2016-10-04 MED ORDER — ORAL CARE MOUTH RINSE: 15.0000 mL | Freq: Two times a day (BID) | OROMUCOSAL | Status: DC

## 2016-10-04 MED ORDER — NITROGLYCERIN 0.4 MG SL SUBL: 0.4000 mg | SUBLINGUAL_TABLET | SUBLINGUAL | Status: DC | PRN

## 2016-10-04 MED ORDER — ONDANSETRON HCL 4 MG/2ML IJ SOLN: 4.0000 mg | Freq: Four times a day (QID) | INTRAMUSCULAR | Status: DC | PRN

## 2016-10-04 MED ORDER — LEVOTHYROXINE SODIUM 75 MCG PO TABS
75.0000 ug | ORAL_TABLET | Freq: Every day | ORAL | Status: DC
  Administered 2016-10-05: 75 ug via ORAL
  Filled 2016-10-04: qty 1

## 2016-10-04 MED ORDER — ALLOPURINOL 100 MG PO TABS
100.0000 mg | ORAL_TABLET | Freq: Every day | ORAL | Status: DC
  Administered 2016-10-05: 100 mg via ORAL
  Filled 2016-10-04: qty 1

## 2016-10-04 MED ORDER — TAMSULOSIN HCL 0.4 MG PO CAPS
0.4000 mg | ORAL_CAPSULE | Freq: Every day | ORAL | Status: DC
  Administered 2016-10-05: 0.4 mg via ORAL
  Filled 2016-10-04: qty 1

## 2016-10-04 MED ORDER — FAMOTIDINE 20 MG PO TABS: 20.0000 mg | ORAL_TABLET | Freq: Every evening | ORAL | Status: DC

## 2016-10-04 MED ORDER — FUROSEMIDE 40 MG PO TABS
40.0000 mg | ORAL_TABLET | Freq: Two times a day (BID) | ORAL | Status: DC
  Administered 2016-10-04 – 2016-10-05 (×2): 40 mg via ORAL
  Filled 2016-10-04 (×2): qty 1

## 2016-10-04 MED ORDER — AMIODARONE HCL 200 MG PO TABS
100.0000 mg | ORAL_TABLET | Freq: Every day | ORAL | Status: DC
  Administered 2016-10-05: 100 mg via ORAL
  Filled 2016-10-04: qty 1

## 2016-10-04 MED ORDER — CARVEDILOL 3.125 MG PO TABS
3.1250 mg | ORAL_TABLET | Freq: Two times a day (BID) | ORAL | Status: DC
  Administered 2016-10-04 – 2016-10-05 (×2): 3.125 mg via ORAL
  Filled 2016-10-04 (×2): qty 1

## 2016-10-04 MED ORDER — SODIUM CHLORIDE 0.9 % IV BOLUS (SEPSIS)
500.0000 mL | Freq: Once | INTRAVENOUS | Status: AC
  Administered 2016-10-04: 500 mL via INTRAVENOUS

## 2016-10-04 MED ORDER — ATORVASTATIN CALCIUM 40 MG PO TABS: 40.0000 mg | ORAL_TABLET | Freq: Every day | ORAL | Status: DC

## 2016-10-04 MED ORDER — ONDANSETRON HCL 4 MG PO TABS: 4.0000 mg | ORAL_TABLET | Freq: Four times a day (QID) | ORAL | Status: DC | PRN

## 2016-10-04 MED ORDER — ACETAMINOPHEN 325 MG PO TABS
650.0000 mg | ORAL_TABLET | Freq: Four times a day (QID) | ORAL | Status: DC | PRN
  Administered 2016-10-04: 650 mg via ORAL
  Filled 2016-10-04: qty 2

## 2016-10-04 MED ORDER — IPRATROPIUM-ALBUTEROL 0.5-2.5 (3) MG/3ML IN SOLN
3.0000 mL | Freq: Three times a day (TID) | RESPIRATORY_TRACT | Status: DC
  Administered 2016-10-04 – 2016-10-05 (×3): 3 mL via RESPIRATORY_TRACT
  Filled 2016-10-04 (×3): qty 3

## 2016-10-04 MED ORDER — OMEGA-3-ACID ETHYL ESTERS 1 G PO CAPS
1.0000 g | ORAL_CAPSULE | Freq: Every day | ORAL | Status: DC
  Administered 2016-10-05: 1 g via ORAL
  Filled 2016-10-04: qty 1

## 2016-10-04 MED ORDER — ADULT MULTIVITAMIN W/MINERALS CH
2.0000 | ORAL_TABLET | Freq: Every day | ORAL | Status: DC
  Administered 2016-10-05: 2 via ORAL
  Filled 2016-10-04: qty 2

## 2016-10-04 MED ORDER — ACETAMINOPHEN 650 MG RE SUPP: 650.0000 mg | Freq: Four times a day (QID) | RECTAL | Status: DC | PRN

## 2016-10-04 NOTE — ED Triage Notes (Signed)
Pt began having penile bleeding today at 0300. States he has had increased urination. Caregiver states patient has be tired more recently.

## 2016-10-04 NOTE — ED Provider Notes (Signed)
[facility] DEPT Provider Note   CSN: [id number] Arrival date & time: 10/04/16  1040     History   Chief Complaint Chief Complaint  Patient presents with  . Penile Bleeding    HPI CARVEL HUSKINS is a 83 y.o. male.  Patient presents with unexplained hematuria approximately 0300 today. This has never happened before. No fever, sweats, chills, flank pain, or suprapubic discomfort. Past medical history includes BPH, AAA, CAD, cardiomyopathy, atrial fibrillation, many others. Severity of symptoms is moderate to severe. He is on Eliquis.  Nothing makes symptoms better or worse.      Past Medical History:  Diagnosis Date  . AAA (abdominal aortic aneurysm) (HCC)    a. 02/2009 U/S 4.4 x 4.5 cm  . BPH (benign prostatic hyperplasia)   . CKD (chronic kidney disease), stage III   . Coronary artery disease   . DJD (degenerative joint disease)   . History of tobacco abuse    a. roughly 36 pack years, quit @ age 55.  Marland Kitchen Hypertension   . Hypothyroidism   . Non-ischemic cardiomyopathy (HCC)   . Pancreatitis    a. 10/2011 - managed conservatively @ home.  . Persistent atrial fibrillation (HCC)   . Symptomatic bradycardia    a. s/p STJ dual chamber PPM   . Systolic heart failure (HCC)    02/2013    Patient Active Problem List   Diagnosis Date Noted  . Pressure injury of skin 05/22/2016  . Acute systolic CHF (congestive heart failure) (HCC) 05/21/2016  . Acute on chronic respiratory failure with hypoxia (HCC) 05/21/2016  . ARF (acute renal failure) (HCC) 05/21/2016  . Intertrochanteric fracture of right hip (HCC) 04/19/2016  . Malnutrition of moderate degree 04/17/2016  . Diarrhea 04/14/2016  . CKD (chronic kidney disease) stage 3, GFR 30-59 ml/min 04/14/2016  . Paroxysmal atrial fibrillation (HCC) 09/27/2015  . Non-ischemic cardiomyopathy (HCC)   . Respiratory failure with hypoxia (HCC) 02/24/2013  . Bronchopneumonia 02/22/2013  . SOB (shortness of breath) 02/22/2013  .  Acute combined systolic and diastolic heart failure (HCC) 02/22/2013  . CAP (community acquired pneumonia) 02/22/2013  . Cellulitis of left lower extremity 02/22/2013  . Aneurysm of abdominal vessel (HCC) 03/07/2012  . Bradycardia 03/04/2012  . CAD (coronary artery disease) 03/04/2012  . Pacemaker-St. Jude 11/23/2011    Past Surgical History:  Procedure Laterality Date  . CATARACT EXTRACTION W/ INTRAOCULAR LENS  IMPLANT, BILATERAL Bilateral 1980's  . CORONARY ANGIOGRAM  11/19/2011   Procedure: CORONARY ANGIOGRAM;  Surgeon: [name], MD;  Location: [facility] CATH LAB;  Service: Cardiovascular;;  . CORONARY ANGIOPLASTY WITH STENT PLACEMENT  12/22/2011    LAD & CIRCUMFLEX  . EYE SURGERY    . INTRAMEDULLARY (IM) NAIL INTERTROCHANTERIC Right 04/19/2016   Procedure: INTRAMEDULLARY (IM) NAIL INTERTROCHANTRIC;  Surgeon: Sheral Apley, MD;  Location: [facility] OR;  Service: Orthopedics;  Laterality: Right;  . PERCUTANEOUS CORONARY STENT INTERVENTION (PCI-S) N/A 12/22/2011   Procedure: PERCUTANEOUS CORONARY STENT INTERVENTION (PCI-S);  Surgeon: [name], MD;  Location: [facility] CATH LAB;  Service: Cardiovascular;  Laterality: N/A;  . PERMANENT PACEMAKER INSERTION N/A 11/20/2011   STJ dual chamber PPM implanted by [name] for symptomatic bradycardia   . TONSILLECTOMY  ~ 1933       Home Medications    Prior to Admission medications   Medication Sig Start Date End Date Taking? Authorizing Provider  allopurinol (ZYLOPRIM) 100 MG tablet Take 1 tablet (100 mg total) by mouth daily. 04/23/16  Yes Rai,  Ripudeep K, MD  amiodarone (PACERONE) 200 MG tablet Take 100 mg by mouth daily.   Yes [provider]  apixaban (ELIQUIS) 2.5 MG TABS tablet Take 2.5 mg by mouth 2 (two) times daily.   Yes [provider]  atorvastatin (LIPITOR) 80 MG tablet Take 40 mg by mouth daily at 6 PM.  11/23/11  Yes Rinaldo Cloud, MD  carvedilol (COREG) 3.125 MG tablet Take 1 tablet (3.125 mg total) by mouth 2  (two) times daily with a meal. 04/23/16  Yes Rai, Ripudeep K, MD  famotidine (PEPCID) 20 MG tablet Take 20 mg by mouth every evening.  12/23/11  Yes Rinaldo Cloud, MD  furosemide (LASIX) 20 MG tablet Take 2 tablets (40 mg) in the morning and 1 tablet (20 mg) in the afternoon Patient taking differently: 40 mg 2 (two) times daily.  07/15/16  Yes Allred, Fayrene Fearing, MD  ipratropium-albuterol (DUONEB) 0.5-2.5 (3) MG/3ML SOLN Take 3 mLs by nebulization 3 (three) times daily. 04/23/16  Yes Rai, Ripudeep K, MD  levothyroxine (SYNTHROID, LEVOTHROID) 75 MCG tablet Take 75 mcg by mouth daily before breakfast. 04/30/14  Yes [provider]  Multiple Vitamin (MULTIVITAMIN WITH MINERALS) TABS tablet Take 1 tablet by mouth daily. Patient taking differently: Take 2 tablets by mouth daily.  04/23/16  Yes Rai, Ripudeep K, MD  tamsulosin (FLOMAX) 0.4 MG CAPS capsule Take 0.4 mg by mouth daily.   Yes [provider]  fish oil-omega-3 fatty acids 1000 MG capsule Take 1 g by mouth every morning.     [provider]  HYDROcodone-acetaminophen (NORCO/VICODIN) 5-325 MG tablet Take 1 tablet by mouth every 6 (six) hours as needed for severe pain. Patient not taking: Reported on 10/04/2016 04/24/16   Bufford Spikes L, DO  nitroGLYCERIN (NITROSTAT) 0.4 MG SL tablet Place 1 tablet (0.4 mg total) under the tongue every 5 (five) minutes x 3 doses as needed for chest pain. 11/23/11   Rinaldo Cloud, MD    Family History Family History  Problem Relation Age of Onset  . Other Father        Accidental death @ age 81 - box fell on him at work  . Cancer Mother        died @ 20 Bladder cancer    Social History Social History  Substance Use Topics  . Smoking status: Former Smoker    Packs/day: 1.00    Years: 35.00    Types: Cigarettes    Quit date: 12/17/1980  . Smokeless tobacco: Never Used     Comment: Quit at age 62.  Marland Kitchen Alcohol use No     Allergies   Iodinated diagnostic agents and Isovue  [iopamidol]   Review of Systems Review of Systems  All other systems reviewed and are negative.    Physical Exam Updated Vital Signs BP 118/75   Pulse 70   Temp 97.7 F (36.5 C) (Oral)   Resp (!) 30   Ht 5\' 10"  (1.778 m)   Wt 79.4 kg (175 lb)   SpO2 96%   BMI 25.11 kg/m   Physical Exam  Constitutional: He is oriented to person, place, and time. He appears well-developed and well-nourished.  HENT:  Head: Normocephalic and atraumatic.  Eyes: Conjunctivae are normal.  Neck: Neck supple.  Cardiovascular: Normal rate and regular rhythm.   Pulmonary/Chest: Effort normal and breath sounds normal.  Abdominal: Soft. Bowel sounds are normal.  Genitourinary:  Genitourinary Comments: Gross blood noted from the tip of the urethra. Otherwise penis and  testicles are normal.  Musculoskeletal: Normal range of motion.  Neurological: He is alert and oriented to person, place, and time.  Skin: Skin is warm and dry.  Psychiatric: He has a normal mood and affect. His behavior is normal.  Nursing note and vitals reviewed.    ED Treatments / Results  Labs (all labs ordered are listed, but only abnormal results are displayed) Labs Reviewed  URINALYSIS, ROUTINE W REFLEX MICROSCOPIC - Abnormal; Notable for the following:       Result Value   Color, Urine RED (*)    APPearance TURBID (*)    Glucose, UA   (*)    Value: TEST NOT REPORTED DUE TO COLOR INTERFERENCE OF URINE PIGMENT   Hgb urine dipstick   (*)    Value: TEST NOT REPORTED DUE TO COLOR INTERFERENCE OF URINE PIGMENT   Bilirubin Urine   (*)    Value: TEST NOT REPORTED DUE TO COLOR INTERFERENCE OF URINE PIGMENT   Ketones, ur   (*)    Value: TEST NOT REPORTED DUE TO COLOR INTERFERENCE OF URINE PIGMENT   Protein, ur   (*)    Value: TEST NOT REPORTED DUE TO COLOR INTERFERENCE OF URINE PIGMENT   Nitrite   (*)    Value: TEST NOT REPORTED DUE TO COLOR INTERFERENCE OF URINE PIGMENT   Leukocytes, UA   (*)    Value: TEST NOT REPORTED  DUE TO COLOR INTERFERENCE OF URINE PIGMENT   All other components within normal limits  URINALYSIS, MICROSCOPIC (REFLEX) - Abnormal; Notable for the following:    Bacteria, UA FEW (*)    Squamous Epithelial / LPF 0-5 (*)    All other components within normal limits  CBC WITH DIFFERENTIAL/PLATELET - Abnormal; Notable for the following:    RBC 3.55 (*)    Hemoglobin 12.0 (*)    HCT 35.8 (*)    MCV 100.8 (*)    RDW 18.2 (*)    Platelets 132 (*)    All other components within normal limits  BASIC METABOLIC PANEL - Abnormal; Notable for the following:    BUN 47 (*)    Creatinine, Ser 2.06 (*)    GFR calc non Af Amer 27 (*)    GFR calc Af Amer 31 (*)    All other components within normal limits  URINE CULTURE  TYPE AND SCREEN    EKG  EKG Interpretation None       Radiology Ct Abdomen Pelvis Wo Contrast  Result Date: 10/04/2016 CLINICAL DATA:  Pt began having penile bleeding today at 0300. States he has had increased urination. Caregiver states patient has be tired more recently. EXAM: CT ABDOMEN AND PELVIS WITHOUT CONTRAST TECHNIQUE: Multidetector CT imaging of the abdomen and pelvis was performed following the standard protocol without IV contrast. COMPARISON:  CT, 04/19/2016 FINDINGS: Lower chest: Interstitial fibrosis and emphysema at the lung bases. Small to moderate-sized bilateral pleural effusions. Heart is enlarged with dense coronary artery calcifications. Hepatobiliary: Liver normal in size. No mass or focal lesion. Small dependent gallstones. No gallbladder wall thickening or adjacent inflammation. No bile duct dilation. Pancreas: Pancreatic atrophy.  No mass or inflammation. Spleen: Normal in size without focal abnormality. Adrenals/Urinary Tract: Small nonobstructing stone in the upper pole the right kidney. 7 mm hyperattenuating mass from the anterior midpole the right kidney, stable from the prior CT. No other renal masses. Mild bilateral renal cortical thinning. No  hydronephrosis. Ureters are normal course and caliber. Bladder is unremarkable. Stomach/Bowel: Stomach and  small bowel are unremarkable. No colonic wall thickening or inflammation. Appendix not visualized. Vascular/Lymphatic: Large infrarenal abdominal aortic aneurysm measuring 7 cm. No evidence of rupture. Fairly extensive atherosclerosis throughout the aorta and its branch vessels. No adenopathy. Reproductive: Significant prostate enlargement. Prostate measures 7.3 x 6.5 x 7.3 cm. It elevates the bladder base. Other: Trace amount ascites adjacent to the liver and spleen. No abdominal wall hernia. Musculoskeletal: Status post ORIF of a right proximal femur fracture. Bones are demineralized. No acute fractures. There are no osteoblastic or osteolytic lesions. IMPRESSION: 1. Significant prostatic enlargement, with the prostate measuring 7.3 cm in greatest dimension. This may be source of penile bleeding. 2. No acute abnormalities within the abdomen pelvis. 3. 7 cm abdominal aortic aneurysm. Significant aortic and branch vessel atherosclerosis. Aortic aneurysm has increased from 6.4 cm on the prior exam. Vascular surgery consultation recommended due to increased risk of rupture for AAA >5.5 cm. This recommendation follows ACR consensus guidelines: White Paper of the ACR Incidental Findings Committee II on Vascular Findings. J Am Coll Radiol 2013; 10:789-794. 4. Small moderate pleural effusions. Irregular interstitial thickening at both lung bases most of which appears to be chronic interstitial fibrosis. Superimposed interstitial edema is possible. Cardiomegaly. 5. Trace ascites Electronically Signed   By: Amie Portland M.D.   On: 10/04/2016 13:21    Procedures Procedures (including critical care time)  Medications Ordered in ED Medications  sodium chloride 0.9 % bolus 500 mL (0 mLs Intravenous Stopped 10/04/16 1420)     Initial Impression / Assessment and Plan / ED Course  I have reviewed the triage vital  signs and the nursing notes.  Pertinent labs & imaging results that were available during my care of the patient were reviewed by me and considered in my medical decision making (see chart for details).     I discussed the clinical scenario with urologist on call Dr. Berneice Heinrich.  He recommended observation admission. Recheck hemoglobin in a.m. No Foley catheter. Hold Eliquis if medically appropriate. Discussed with Dr. Arbutus Leas  Final Clinical Impressions(s) / ED Diagnoses   Final diagnoses:  Hematuria, unspecified type  Prostate hypertrophy  Abdominal aortic aneurysm (AAA) without rupture [facility])    New Prescriptions New Prescriptions   No medications on file     Donnetta Hutching, MD 10/04/16 1501

## 2016-10-04 NOTE — H&P (Signed)
History and Physical  Connor Diaz ZOX:[id number] DOB: 09/15/1923 DOA: 10/04/2016   PCP: Oval Linsey, MD   Patient coming from: Home  Chief Complaint: hematuria  HPI:  Connor Diaz is a 82 y.o. male with medical history of AAA, ischemic cardiomyopathy with EF 25-30 percent, atrial fibrillation on apixaban, BPH, CKD 3, essential hypertension, hypothyroidism, tachybradycardia syndrome status post permanent pacemaker, and hyperlipidemia presenting with painless hematuria when he woke up at 3 AM on 10/04/2016. The patient denies any fevers, chills, abdominal pain, dizziness, or syncope. He did have some intermittent dysuria for the past 3-4 days. The patient states that he has never had any problems with hematuria in the past. He denies any recent surgeries or instrumentation. Since his episode of hematuria 3 AM, the patient has been able to void spontaneously a number of times although he continues to have hematuria.  Upon presentation to the emergency room, the patient was afebrile and hemodynamically stable and saturating 96-97 percent on 2 L. He was able to void spontaneously in the emergency department without dysuria. Hemoglobin was 12.0 Platelets were 132,000. BMP showed a serum creatinine 2.06 which was above his baseline of 1.3-1.5. EDP spoke with urology, Dr. Berneice Heinrich, who did not feel patient needed any immediate urologic intervention, but he recommended observation and monitoring Hgb.  Dr. Berneice Heinrich did not recommend foley as pt is able to void. CT of the abdomen and pelvis was negative for any hydronephrosis. It showed a right renal nonobstructive stone. There was also a right renal 7 mm anterior hyperenhancing mass. A 7 cm infrarenal AAA was also noted which was larger than 6.4 cm measured on 04/19/2016.  status post multivessel PCI to LAD, left circumflex and obtuse marginal 3 in November 2013.  Assessment/Plan: Hematuria -EDP spoke with urology, Dr. Fleeta Emmer apixaban,  monitor Hgb, no need for foley if able to void spontaneously, and observe x 24 hours -source likely prostate -10/04/16 CT abd--negative for any hydronephrosis.right renal nonobstructive stone. There was also a right renal 7 mm anterior hyperenhancing mass. A 7 cm infrarenal AAA was also noted which was larger than 6.4 cm measured on 04/19/2016. -Monitor Hgb -Baseline Hgb ~11-12  Ischemic cardiomyopathy/Chronic systolic CHF -daily weights -cardiologist is Dr. [name] -continue home dose lasix--40 mg po bid -04/15/2016 echo EF 25-30%, diffuse HK, PASP 39, mild MR/AI  -continue carvedilol  Acute on chronic renal failure (CKD3) -baseline creatinine 1.3-1.6 -due to mild intravascular volume depletion -recheck BMP in am  AAA/ Aneurysm of abdominal vessel (HCC) - CT without contrast showed AAA slightly larger than compared to 04/19/16 - Patient was seen on 05/22/15 in [facility], seen by Dr Pattricia Boss, at that time AAA measured 6.1 x 6.2 cm, patient was recommended to follow up. Another note on 06/24/15 stated that he was not interested in moving forward with surgery due to potential impact on his quality of life -Seen by Dr. Veda Canning in March 2018-->outpatient followup -10/04/16--pt confirms that he would not want any surgical intervention at this time--"I've lived a good life"  Paroxysmal atrial fibrillation (HCC) - Rate controlled, with pacer, on amiodarone - CHADvasc score 5 -holding apixaban due to hematuria  Chronic respiratory failure with hypoxia -stable on 2 L -duonebs  CAD -status post multivessel PCI to LAD, left circumflex and obtuse marginal 3 in November 2013. -no chest pain  Hyperlipidemia -continue statin       Past Medical History:  Diagnosis Date  . AAA (abdominal aortic aneurysm) (HCC)  a. 02/2009 U/S 4.4 x 4.5 cm  . BPH (benign prostatic hyperplasia)   . CKD (chronic kidney disease), stage III   . Coronary artery disease   . DJD (degenerative joint  disease)   . History of tobacco abuse    a. roughly 36 pack years, quit @ age 68.  Marland Kitchen Hypertension   . Hypothyroidism   . Non-ischemic cardiomyopathy (HCC)   . Pancreatitis    a. 10/2011 - managed conservatively @ home.  . Persistent atrial fibrillation (HCC)   . Symptomatic bradycardia    a. s/p STJ dual chamber PPM   . Systolic heart failure (HCC)    02/2013   Past Surgical History:  Procedure Laterality Date  . CATARACT EXTRACTION W/ INTRAOCULAR LENS  IMPLANT, BILATERAL Bilateral 1980's  . CORONARY ANGIOGRAM  11/19/2011   Procedure: CORONARY ANGIOGRAM;  Surgeon: [name], MD;  Location: [facility] CATH LAB;  Service: Cardiovascular;;  . CORONARY ANGIOPLASTY WITH STENT PLACEMENT  12/22/2011    LAD & CIRCUMFLEX  . EYE SURGERY    . INTRAMEDULLARY (IM) NAIL INTERTROCHANTERIC Right 04/19/2016   Procedure: INTRAMEDULLARY (IM) NAIL INTERTROCHANTRIC;  Surgeon: Sheral Apley, MD;  Location: [facility] OR;  Service: Orthopedics;  Laterality: Right;  . PERCUTANEOUS CORONARY STENT INTERVENTION (PCI-S) N/A 12/22/2011   Procedure: PERCUTANEOUS CORONARY STENT INTERVENTION (PCI-S);  Surgeon: [name], MD;  Location: [facility] CATH LAB;  Service: Cardiovascular;  Laterality: N/A;  . PERMANENT PACEMAKER INSERTION N/A 11/20/2011   STJ dual chamber PPM implanted by [name] for symptomatic bradycardia   . TONSILLECTOMY  ~ 78   Social History:  reports that he quit smoking about 35 years ago. His smoking use included Cigarettes. He has a 35.00 pack-year smoking history. He has never used smokeless tobacco. He reports that he does not drink alcohol or use drugs.   Family History  Problem Relation Age of Onset  . Other Father        Accidental death @ age 10 - box fell on him at work  . Cancer Mother        died @ 64 Bladder cancer     Allergies  Allergen Reactions  . Iodinated Diagnostic Agents Rash and Swelling    Swelling in face, urticaria. Received Isovoc 370 (non-ionic) on 10/24/14.  . Isovue  [Iopamidol] Itching and Swelling    Pt had slight erythema, itching and facial swelling right ear and right lower chin.  Pt sneezed several times after contrast injection.  Pt was monitored for 1 hr post injection and was given water to drink. Dr Gery Pray feels he had a very mild reaction.  Premeds are warranted in this patient.  Thanks, Gildardo Griffes     Prior to Admission medications   Medication Sig Start Date End Date Taking? Authorizing Provider  allopurinol (ZYLOPRIM) 100 MG tablet Take 1 tablet (100 mg total) by mouth daily. 04/23/16  Yes Rai, Ripudeep K, MD  amiodarone (PACERONE) 200 MG tablet Take 100 mg by mouth daily.   Yes [provider]  apixaban (ELIQUIS) 2.5 MG TABS tablet Take 2.5 mg by mouth 2 (two) times daily.   Yes [provider]  atorvastatin (LIPITOR) 80 MG tablet Take 40 mg by mouth daily at 6 PM.  11/23/11  Yes Rinaldo Cloud, MD  carvedilol (COREG) 3.125 MG tablet Take 1 tablet (3.125 mg total) by mouth 2 (two) times daily with a meal. 04/23/16  Yes Rai, Ripudeep K, MD  famotidine (PEPCID) 20 MG tablet Take 20  mg by mouth every evening.  12/23/11  Yes Rinaldo Cloud, MD  furosemide (LASIX) 20 MG tablet Take 2 tablets (40 mg) in the morning and 1 tablet (20 mg) in the afternoon Patient taking differently: 40 mg 2 (two) times daily.  07/15/16  Yes Allred, Fayrene Fearing, MD  ipratropium-albuterol (DUONEB) 0.5-2.5 (3) MG/3ML SOLN Take 3 mLs by nebulization 3 (three) times daily. 04/23/16  Yes Rai, Ripudeep K, MD  levothyroxine (SYNTHROID, LEVOTHROID) 75 MCG tablet Take 75 mcg by mouth daily before breakfast. 04/30/14  Yes [provider]  Multiple Vitamin (MULTIVITAMIN WITH MINERALS) TABS tablet Take 1 tablet by mouth daily. Patient taking differently: Take 2 tablets by mouth daily.  04/23/16  Yes Rai, Ripudeep K, MD  tamsulosin (FLOMAX) 0.4 MG CAPS capsule Take 0.4 mg by mouth daily.   Yes [provider]  fish oil-omega-3 fatty acids 1000 MG capsule Take 1 g by  mouth every morning.     [provider]  nitroGLYCERIN (NITROSTAT) 0.4 MG SL tablet Place 1 tablet (0.4 mg total) under the tongue every 5 (five) minutes x 3 doses as needed for chest pain. 11/23/11   Rinaldo Cloud, MD    Review of Systems:  Constitutional:  No weight loss, night sweats, Fevers, chills, fatigue.  Head&Eyes: No headache.  No vision loss.  No eye pain or scotoma ENT:  No Difficulty swallowing,Tooth/dental problems,Sore throat,  No ear ache, post nasal drip,  Cardio-vascular:  No chest pain, Orthopnea, PND, swelling in lower extremities,  dizziness, palpitations  GI:  No  abdominal pain, nausea, vomiting, diarrhea, loss of appetite, hematochezia, melena, heartburn, indigestion, Resp:  No shortness of breath with exertion or at rest. No cough. No coughing up of blood .No wheezing.No chest wall deformity  Skin:  no rash or lesions.  GU:  no dysuria, +hematuria; no urgency or frequency. No flank pain.  Musculoskeletal:  No joint pain or swelling. No decreased range of motion. No back pain.  Psych:  No change in mood or affect. No depression or anxiety. Neurologic: No headache, no dysesthesia, no focal weakness, no vision loss. No syncope  Physical Exam: Vitals:   10/04/16 1046 10/04/16 1330 10/04/16 1345 10/04/16 1400  BP: 124/77 117/80  118/75  Pulse: 74 73 71 70  Resp: (!) 30     Temp: 97.7 F (36.5 C)     TempSrc: Oral     SpO2: (!) 88% 96% 95% 96%  Weight: 79.4 kg (175 lb)     Height: 5\' 10"  (1.778 m)      General:  A&O x 3, NAD, nontoxic, pleasant/cooperative Head/Eye: No conjunctival hemorrhage, no icterus, [place]/AT, No nystagmus ENT:  No icterus,  No thrush, good dentition, no pharyngeal exudate Neck:  No masses, no lymphadenpathy, no bruits CV:  RRR, no rub, no gallop, no S3 Lung:  CTAB, good air movement, no wheeze, no rhonchi Abdomen: soft/NT, +BS, nondistended, no peritoneal signs Ext: No cyanosis, No rashes, No petechiae, No lymphangitis,  No edema Neuro: CNII-XII intact, strength 4/5 in bilateral upper and lower extremities, no dysmetria  Labs on Admission:  Basic Metabolic Panel:  Recent Labs Lab 10/04/16 1249  NA 142  K 4.6  CL 104  CO2 29  GLUCOSE 76  BUN 47*  CREATININE 2.06*  CALCIUM 9.0   Liver Function Tests: No results for input(s): AST, ALT, ALKPHOS, BILITOT, PROT, ALBUMIN in the last 168 hours. No results for input(s): LIPASE, AMYLASE in the last 168 hours. No results for input(s): AMMONIA  in the last 168 hours. CBC:  Recent Labs Lab 10/04/16 1249  WBC 7.6  NEUTROABS 5.0  HGB 12.0*  HCT 35.8*  MCV 100.8*  PLT 132*   Coagulation Profile: No results for input(s): INR, PROTIME in the last 168 hours. Cardiac Enzymes: No results for input(s): CKTOTAL, CKMB, CKMBINDEX, TROPONINI in the last 168 hours. BNP: Invalid input(s): POCBNP CBG: No results for input(s): GLUCAP in the last 168 hours. Urine analysis:    Component Value Date/Time   COLORURINE RED (A) 10/04/2016 1125   APPEARANCEUR TURBID (A) 10/04/2016 1125   LABSPEC  10/04/2016 1125    TEST NOT REPORTED DUE TO COLOR INTERFERENCE OF URINE PIGMENT   PHURINE  10/04/2016 1125    TEST NOT REPORTED DUE TO COLOR INTERFERENCE OF URINE PIGMENT   GLUCOSEU (A) 10/04/2016 1125    TEST NOT REPORTED DUE TO COLOR INTERFERENCE OF URINE PIGMENT   HGBUR (A) 10/04/2016 1125    TEST NOT REPORTED DUE TO COLOR INTERFERENCE OF URINE PIGMENT   BILIRUBINUR (A) 10/04/2016 1125    TEST NOT REPORTED DUE TO COLOR INTERFERENCE OF URINE PIGMENT   KETONESUR (A) 10/04/2016 1125    TEST NOT REPORTED DUE TO COLOR INTERFERENCE OF URINE PIGMENT   PROTEINUR (A) 10/04/2016 1125    TEST NOT REPORTED DUE TO COLOR INTERFERENCE OF URINE PIGMENT   NITRITE (A) 10/04/2016 1125    TEST NOT REPORTED DUE TO COLOR INTERFERENCE OF URINE PIGMENT   LEUKOCYTESUR (A) 10/04/2016 1125    TEST NOT REPORTED DUE TO COLOR INTERFERENCE OF URINE PIGMENT   Sepsis  Labs: @LABRCNTIP (procalcitonin:4,lacticidven:4) )No results found for this or any previous visit (from the past 240 hour(s)).   Radiological Exams on Admission: Ct Abdomen Pelvis Wo Contrast  Result Date: 10/04/2016 CLINICAL DATA:  Pt began having penile bleeding today at 0300. States he has had increased urination. Caregiver states patient has be tired more recently. EXAM: CT ABDOMEN AND PELVIS WITHOUT CONTRAST TECHNIQUE: Multidetector CT imaging of the abdomen and pelvis was performed following the standard protocol without IV contrast. COMPARISON:  CT, 04/19/2016 FINDINGS: Lower chest: Interstitial fibrosis and emphysema at the lung bases. Small to moderate-sized bilateral pleural effusions. Heart is enlarged with dense coronary artery calcifications. Hepatobiliary: Liver normal in size. No mass or focal lesion. Small dependent gallstones. No gallbladder wall thickening or adjacent inflammation. No bile duct dilation. Pancreas: Pancreatic atrophy.  No mass or inflammation. Spleen: Normal in size without focal abnormality. Adrenals/Urinary Tract: Small nonobstructing stone in the upper pole the right kidney. 7 mm hyperattenuating mass from the anterior midpole the right kidney, stable from the prior CT. No other renal masses. Mild bilateral renal cortical thinning. No hydronephrosis. Ureters are normal course and caliber. Bladder is unremarkable. Stomach/Bowel: Stomach and small bowel are unremarkable. No colonic wall thickening or inflammation. Appendix not visualized. Vascular/Lymphatic: Large infrarenal abdominal aortic aneurysm measuring 7 cm. No evidence of rupture. Fairly extensive atherosclerosis throughout the aorta and its branch vessels. No adenopathy. Reproductive: Significant prostate enlargement. Prostate measures 7.3 x 6.5 x 7.3 cm. It elevates the bladder base. Other: Trace amount ascites adjacent to the liver and spleen. No abdominal wall hernia. Musculoskeletal: Status post ORIF of a right  proximal femur fracture. Bones are demineralized. No acute fractures. There are no osteoblastic or osteolytic lesions. IMPRESSION: 1. Significant prostatic enlargement, with the prostate measuring 7.3 cm in greatest dimension. This may be source of penile bleeding. 2. No acute abnormalities within the abdomen pelvis. 3. 7 cm abdominal aortic aneurysm.  Significant aortic and branch vessel atherosclerosis. Aortic aneurysm has increased from 6.4 cm on the prior exam. Vascular surgery consultation recommended due to increased risk of rupture for AAA >5.5 cm. This recommendation follows ACR consensus guidelines: White Paper of the ACR Incidental Findings Committee II on Vascular Findings. J Am Coll Radiol 2013; 10:789-794. 4. Small moderate pleural effusions. Irregular interstitial thickening at both lung bases most of which appears to be chronic interstitial fibrosis. Superimposed interstitial edema is possible. Cardiomegaly. 5. Trace ascites Electronically Signed   By: Amie Portland M.D.   On: 10/04/2016 13:21        Time spent:60 minutes Code Status:   DNR Family Communication: spouse updated at bedside Disposition Plan: expect 1-2 day hospitalization Consults called: urology--Dr. Berneice Heinrich by EDP DVT Prophylaxis:SCDs  Nickalaus Crooke, DO  [facility] Pager [phone]  If 7PM-7AM, please contact night-coverage [email or web address] Password [facility] 10/04/2016, 3:28 PM

## 2016-10-05 DIAGNOSIS — R319 Hematuria, unspecified: Secondary | ICD-10-CM | POA: Diagnosis not present

## 2016-10-05 LAB — CBC
HEMATOCRIT: 34.6 % — AB (ref 39.0–52.0)
HEMOGLOBIN: 11.5 g/dL — AB (ref 13.0–17.0)
MCH: 33.6 pg (ref 26.0–34.0)
MCHC: 33.2 g/dL (ref 30.0–36.0)
MCV: 101.2 fL — ABNORMAL HIGH (ref 78.0–100.0)
Platelets: 146 10*3/uL — ABNORMAL LOW (ref 150–400)
RBC: 3.42 MIL/uL — ABNORMAL LOW (ref 4.22–5.81)
RDW: 18.1 % — ABNORMAL HIGH (ref 11.5–15.5)
WBC: 7.2 10*3/uL (ref 4.0–10.5)

## 2016-10-05 LAB — BASIC METABOLIC PANEL
ANION GAP: 8 (ref 5–15)
BUN: 44 mg/dL — ABNORMAL HIGH (ref 6–20)
CO2: 29 mmol/L (ref 22–32)
Calcium: 8.4 mg/dL — ABNORMAL LOW (ref 8.9–10.3)
Chloride: 104 mmol/L (ref 101–111)
Creatinine, Ser: 1.82 mg/dL — ABNORMAL HIGH (ref 0.61–1.24)
GFR calc Af Amer: 36 mL/min — ABNORMAL LOW (ref >60)
GFR, EST NON AFRICAN AMERICAN: 31 mL/min — AB (ref >60)
GLUCOSE: 83 mg/dL (ref 65–99)
POTASSIUM: 3.7 mmol/L (ref 3.5–5.1)
Sodium: 141 mmol/L (ref 135–145)

## 2016-10-05 MED ORDER — MUPIROCIN CALCIUM 2 % EX CREA
TOPICAL_CREAM | Freq: Every day | CUTANEOUS | Status: DC
  Administered 2016-10-05: 16:00:00 via TOPICAL
  Filled 2016-10-05: qty 15

## 2016-10-05 MED ORDER — CIPROFLOXACIN HCL 250 MG PO TABS
500.0000 mg | ORAL_TABLET | Freq: Every day | ORAL | Status: DC
  Administered 2016-10-05: 500 mg via ORAL
  Filled 2016-10-05: qty 2

## 2016-10-05 MED ORDER — CIPROFLOXACIN HCL 500 MG PO TABS: 500.0000 mg | ORAL_TABLET | Freq: Every day | ORAL | 0 refills | Status: DC

## 2016-10-05 NOTE — Discharge Summary (Signed)
Physician Discharge Summary  Connor Diaz ZOX:[id number] DOB: [birth year] DOA: 10/04/2016  PCP: Oval Linsey, MD  Admit date: 10/04/2016 Discharge date: 10/05/2016   Recommendations for Outpatient Follow-up:  Patient is advised to take Cipro 5 mg by mouth twice a day for a full 7 days as well as Flomax 0.4 mg by mouth daily for 7 days as well as PREHOSPITAL admission medicines and follow-up my office in 7-9 days time we will consider urology consult Discharge Diagnoses:  Active Problems:   Paroxysmal atrial fibrillation (HCC)   CKD (chronic kidney disease) stage 3, GFR 30-59 ml/min   Hematuria   Chronic respiratory failure with hypoxia (HCC)   Acute renal failure superimposed on stage 3 chronic kidney disease (HCC)   Chronic systolic CHF (congestive heart failure) [facility])   Discharge Condition: Good  Filed Weights   10/04/16 1046 10/04/16 1755  Weight: 79.4 kg (175 lb) 86 kg (189 lb 8 oz)    History of present illness:  Patient has chronic atrial fibrillation nonischemic cardiomyopathy ejection fraction 2530% chronic anticoagulation on Xarelto hypertension hyperlipidemia who was found to have 4 days of dysuria then some frank hematuria admitted placed on IV antibiotics with significant resolution 40 hour. He was hemodynamically stable while in hospital was felt safe to go home on oral antibiotics which included Cipro 500 mg by mouth twice a day as well as Flomax 0.4 mg by mouth daily  Hospital Course:  See history of present illness above  Procedures:    Consultations:    Discharge Instructions    Allergies  Allergen Reactions  . Iodinated Diagnostic Agents Rash and Swelling    Swelling in face, urticaria. Received Isovoc 370 (non-ionic) on 10/24/14.  . Isovue [Iopamidol] Itching and Swelling    Pt had slight erythema, itching and facial swelling right ear and right lower chin.  Pt sneezed several times after contrast injection.  Pt was monitored for 1 hr  post injection and was given water to drink. Dr Gery Pray feels he had a very mild reaction.  Premeds are warranted in this patient.  Thanks, Gildardo Griffes   Follow-up Information    [facility], [facility] Follow up.   Specialty:  Home Health Services Contact information: [address] [place] [address] [phone]            The results of significant diagnostics from this hospitalization (including imaging, microbiology, ancillary and laboratory) are listed below for reference.    Significant Diagnostic Studies: Ct Abdomen Pelvis Wo Contrast  Result Date: 10/04/2016 CLINICAL DATA:  Pt began having penile bleeding today at 0300. States he has had increased urination. Caregiver states patient has be tired more recently. EXAM: CT ABDOMEN AND PELVIS WITHOUT CONTRAST TECHNIQUE: Multidetector CT imaging of the abdomen and pelvis was performed following the standard protocol without IV contrast. COMPARISON:  CT, 04/19/2016 FINDINGS: Lower chest: Interstitial fibrosis and emphysema at the lung bases. Small to moderate-sized bilateral pleural effusions. Heart is enlarged with dense coronary artery calcifications. Hepatobiliary: Liver normal in size. No mass or focal lesion. Small dependent gallstones. No gallbladder wall thickening or adjacent inflammation. No bile duct dilation. Pancreas: Pancreatic atrophy.  No mass or inflammation. Spleen: Normal in size without focal abnormality. Adrenals/Urinary Tract: Small nonobstructing stone in the upper pole the right kidney. 7 mm hyperattenuating mass from the anterior midpole the right kidney, stable from the prior CT. No other renal masses. Mild bilateral renal cortical thinning. No hydronephrosis. Ureters are normal course and  caliber. Bladder is unremarkable. Stomach/Bowel: Stomach and small bowel are unremarkable. No colonic wall thickening or inflammation. Appendix not visualized. Vascular/Lymphatic: Large infrarenal abdominal aortic aneurysm  measuring 7 cm. No evidence of rupture. Fairly extensive atherosclerosis throughout the aorta and its branch vessels. No adenopathy. Reproductive: Significant prostate enlargement. Prostate measures 7.3 x 6.5 x 7.3 cm. It elevates the bladder base. Other: Trace amount ascites adjacent to the liver and spleen. No abdominal wall hernia. Musculoskeletal: Status post ORIF of a right proximal femur fracture. Bones are demineralized. No acute fractures. There are no osteoblastic or osteolytic lesions. IMPRESSION: 1. Significant prostatic enlargement, with the prostate measuring 7.3 cm in greatest dimension. This may be source of penile bleeding. 2. No acute abnormalities within the abdomen pelvis. 3. 7 cm abdominal aortic aneurysm. Significant aortic and branch vessel atherosclerosis. Aortic aneurysm has increased from 6.4 cm on the prior exam. Vascular surgery consultation recommended due to increased risk of rupture for AAA >5.5 cm. This recommendation follows ACR consensus guidelines: White Paper of the ACR Incidental Findings Committee II on Vascular Findings. J Am Coll Radiol 2013; 10:789-794. 4. Small moderate pleural effusions. Irregular interstitial thickening at both lung bases most of which appears to be chronic interstitial fibrosis. Superimposed interstitial edema is possible. Cardiomegaly. 5. Trace ascites Electronically Signed   By: Amie Portland M.D.   On: 10/04/2016 13:21    Microbiology: No results found for this or any previous visit (from the past 240 hour(s)).   Labs: Basic Metabolic Panel:  Recent Labs Lab 10/04/16 1249 10/05/16 0638  NA 142 141  K 4.6 3.7  CL 104 104  CO2 29 29  GLUCOSE 76 83  BUN 47* 44*  CREATININE 2.06* 1.82*  CALCIUM 9.0 8.4*   Liver Function Tests: No results for input(s): AST, ALT, ALKPHOS, BILITOT, PROT, ALBUMIN in the last 168 hours. No results for input(s): LIPASE, AMYLASE in the last 168 hours. No results for input(s): AMMONIA in the last 168  hours. CBC:  Recent Labs Lab 10/04/16 1249 10/05/16 0638  WBC 7.6 7.2  NEUTROABS 5.0  --   HGB 12.0* 11.5*  HCT 35.8* 34.6*  MCV 100.8* 101.2*  PLT 132* 146*   Cardiac Enzymes: No results for input(s): CKTOTAL, CKMB, CKMBINDEX, TROPONINI in the last 168 hours. BNP: BNP (last 3 results)  Recent Labs  04/22/16 0929 05/21/16 1140  BNP 596.7* 962.0*    ProBNP (last 3 results) No results for input(s): PROBNP in the last 8760 hours.  CBG: No results for input(s): GLUCAP in the last 168 hours.     Signed:  Rayni Nemitz Judie Petit  [facility] Pager: [phone] 10/05/2016, 2:12 PM

## 2016-10-05 NOTE — Care Management Note (Signed)
Case Management Note  Patient Details  Name: Connor Diaz MRN: [id number] Date of Birth: Jul 24, 1923  Subjective/Objective:                  Admitted with hematuria.  Pt's family and wife at bedside for DC plan discussion. Pt from home with wife, has aid services through [facility], home oxygen from [facility]. Pt will DC home today with [facility] PT. Pt has chosen [facility] from list of providers. He is aware HH has 48 hrs to make first visit.No further needs/concerns communicated. Family will transport pt home.   Action/Plan: DC home today with HH PT. Delila Spence rep, aware of referral and will obtain pt info from chart.   Expected Discharge Date:  10/05/16               Expected Discharge Plan:  Home w Home Health Services  In-House Referral:  NA  Discharge planning Services  CM Consult  Post Acute Care Choice:  Home Health Choice offered to:  Patient  HH Arranged:  PT, RN [facility] Agency:  [facility]  Status of Service:  Completed, signed off Malcolm Metro, RN 10/05/2016, 2:06 PM

## 2016-10-05 NOTE — Consult Note (Signed)
WOC Nurse wound consult note Reason for Consult: Full thickness ruptured serum filled blister to left anterior lower leg.  Present on admission Wound type: Inflammatory Pressure Injury POA: NA Measurement: 2 cm x 1.5 cm x 0.1 cm  Wound GLO:VFIEPPI ruddy red Drainage (amount, consistency, odor) Minimal serosanguinous  No odor.  Periwound:intact Dressing procedure/placement/frequency:Cleanse wound to left lower leg with NS and pat gently dry.  Apply Bactroban to wound bed. Cover with silicone border foam dressing.  Change daily . Will not follow at this time.  Please re-consult if needed.  Maple Hudson RN BSN [facility] Pager [phone]

## 2016-10-05 NOTE — Care Management Obs Status (Signed)
MEDICARE OBSERVATION STATUS NOTIFICATION   Patient Details  Name: DRAXLER SEVERSON MRN: [id number] Date of Birth: 04-Jun-1923   Medicare Observation Status Notification Given:  Other (see comment) (DC <24hrs)    Malcolm Metro, RN 10/05/2016, 2:31 PM

## 2016-10-05 NOTE — Progress Notes (Signed)
Pt discharged home today per Dr. Dondiego. Pt's IV site D/C'd and WDL. Pt's VSS. Pt provided with home medication list, discharge instructions and prescriptions. Verbalized understanding. Pt left floor via WC in stable condition accompanied by NT. 

## 2016-10-05 NOTE — Progress Notes (Signed)
PHARMACY NOTE:  ANTIMICROBIAL RENAL DOSAGE ADJUSTMENT  Current antimicrobial regimen includes a mismatch between antimicrobial dosage and estimated renal function.  As per policy approved by the Pharmacy & Therapeutics and Medical Executive Committees, the antimicrobial dosage will be adjusted accordingly.  Current antimicrobial dosage:  Cipro 500 mg po bid  Indication:   Renal Function:  Estimated Creatinine Clearance: 27.9 mL/min (A) (by C-G formula based on SCr of 1.82 mg/dL (H)). []      On intermittent HD, scheduled: []      On CRRT    Antimicrobial dosage has been changed to:  Cipro 500 mg po daily  Additional comments:   Thank you for allowing pharmacy to be a part of this patient's care.  Mosetta Pigeon [place], [facility] 10/05/2016 2:11 PM

## 2016-10-06 LAB — URINE CULTURE: Culture: NO GROWTH

## 2016-10-14 ENCOUNTER — Ambulatory Visit (INDEPENDENT_AMBULATORY_CARE_PROVIDER_SITE_OTHER): Payer: Medicare Other | Admitting: *Deleted

## 2016-10-14 DIAGNOSIS — R001 Bradycardia, unspecified: Secondary | ICD-10-CM

## 2016-10-15 NOTE — Progress Notes (Signed)
Remote pacemaker transmission.   

## 2016-10-27 ENCOUNTER — Encounter: Payer: Self-pay | Admitting: Cardiology

## 2016-11-04 LAB — CUP PACEART REMOTE DEVICE CHECK
Brady Statistic AP VP Percent: 0 %
Brady Statistic AS VP Percent: 0 %
Brady Statistic RA Percent Paced: 1 %
Brady Statistic RV Percent Paced: 74 %
Date Time Interrogation Session: 20180829072316
Implantable Lead Location: 753860
Implantable Lead Model: 1944
Lead Channel Impedance Value: 560 Ohm
Lead Channel Pacing Threshold Amplitude: 0.75 V
Lead Channel Pacing Threshold Pulse Width: 0.4 ms
Lead Channel Sensing Intrinsic Amplitude: 11.7 mV
Lead Channel Setting Pacing Amplitude: 5 V
MDC IDC LEAD IMPLANT DT: 20131004
MDC IDC LEAD IMPLANT DT: 20131004
MDC IDC LEAD LOCATION: 753859
MDC IDC MSMT BATTERY REMAINING LONGEVITY: 55 mo
MDC IDC MSMT BATTERY REMAINING PERCENTAGE: 57 %
MDC IDC MSMT BATTERY VOLTAGE: 2.89 V
MDC IDC MSMT LEADCHNL RA IMPEDANCE VALUE: 380 Ohm
MDC IDC MSMT LEADCHNL RA PACING THRESHOLD AMPLITUDE: 0.375 V
MDC IDC MSMT LEADCHNL RA SENSING INTR AMPL: 1.7 mV
MDC IDC MSMT LEADCHNL RV PACING THRESHOLD PULSEWIDTH: 0.4 ms
MDC IDC PG IMPLANT DT: 20131004
MDC IDC SET LEADCHNL RV PACING AMPLITUDE: 2.5 V
MDC IDC SET LEADCHNL RV PACING PULSEWIDTH: 0.4 ms
MDC IDC SET LEADCHNL RV SENSING SENSITIVITY: 2 mV
MDC IDC STAT BRADY AP VS PERCENT: 0 %
MDC IDC STAT BRADY AS VS PERCENT: 0 %
Pulse Gen Model: 2210
Pulse Gen Serial Number: 7394618

## 2016-11-06 ENCOUNTER — Inpatient Hospital Stay (HOSPITAL_COMMUNITY)
Admission: EM | Admit: 2016-11-06 | Discharge: 2016-11-20 | DRG: 291 | Disposition: A | Discharge status: Other Acute Inpatient Hospital | Payer: Medicare Other | Attending: Internal Medicine | Admitting: Internal Medicine

## 2016-11-06 ENCOUNTER — Emergency Department (HOSPITAL_COMMUNITY): Payer: Medicare Other

## 2016-11-06 ENCOUNTER — Encounter (HOSPITAL_COMMUNITY): Payer: Self-pay

## 2016-11-06 DIAGNOSIS — Z7189 Other specified counseling: Secondary | ICD-10-CM | POA: Diagnosis not present

## 2016-11-06 DIAGNOSIS — N401 Enlarged prostate with lower urinary tract symptoms: Secondary | ICD-10-CM | POA: Diagnosis present

## 2016-11-06 DIAGNOSIS — I272 Pulmonary hypertension, unspecified: Secondary | ICD-10-CM | POA: Diagnosis present

## 2016-11-06 DIAGNOSIS — R0600 Dyspnea, unspecified: Secondary | ICD-10-CM | POA: Diagnosis not present

## 2016-11-06 DIAGNOSIS — F05 Delirium due to known physiological condition: Secondary | ICD-10-CM | POA: Diagnosis present

## 2016-11-06 DIAGNOSIS — E873 Alkalosis: Secondary | ICD-10-CM | POA: Diagnosis present

## 2016-11-06 DIAGNOSIS — Z91041 Radiographic dye allergy status: Secondary | ICD-10-CM

## 2016-11-06 DIAGNOSIS — J9601 Acute respiratory failure with hypoxia: Secondary | ICD-10-CM | POA: Diagnosis present

## 2016-11-06 DIAGNOSIS — N183 Chronic kidney disease, stage 3 (moderate): Secondary | ICD-10-CM | POA: Diagnosis present

## 2016-11-06 DIAGNOSIS — F419 Anxiety disorder, unspecified: Secondary | ICD-10-CM | POA: Diagnosis present

## 2016-11-06 DIAGNOSIS — I5041 Acute combined systolic (congestive) and diastolic (congestive) heart failure: Secondary | ICD-10-CM | POA: Diagnosis present

## 2016-11-06 DIAGNOSIS — I2510 Atherosclerotic heart disease of native coronary artery without angina pectoris: Secondary | ICD-10-CM | POA: Diagnosis present

## 2016-11-06 DIAGNOSIS — Y95 Nosocomial condition: Secondary | ICD-10-CM | POA: Diagnosis not present

## 2016-11-06 DIAGNOSIS — Z9842 Cataract extraction status, left eye: Secondary | ICD-10-CM

## 2016-11-06 DIAGNOSIS — J9621 Acute and chronic respiratory failure with hypoxia: Secondary | ICD-10-CM | POA: Diagnosis present

## 2016-11-06 DIAGNOSIS — Z66 Do not resuscitate: Secondary | ICD-10-CM | POA: Diagnosis not present

## 2016-11-06 DIAGNOSIS — I340 Nonrheumatic mitral (valve) insufficiency: Secondary | ICD-10-CM | POA: Diagnosis not present

## 2016-11-06 DIAGNOSIS — E871 Hypo-osmolality and hyponatremia: Secondary | ICD-10-CM

## 2016-11-06 DIAGNOSIS — Z23 Encounter for immunization: Secondary | ICD-10-CM | POA: Diagnosis present

## 2016-11-06 DIAGNOSIS — I509 Heart failure, unspecified: Secondary | ICD-10-CM

## 2016-11-06 DIAGNOSIS — N400 Enlarged prostate without lower urinary tract symptoms: Secondary | ICD-10-CM | POA: Diagnosis present

## 2016-11-06 DIAGNOSIS — I482 Chronic atrial fibrillation: Secondary | ICD-10-CM | POA: Diagnosis not present

## 2016-11-06 DIAGNOSIS — E038 Other specified hypothyroidism: Secondary | ICD-10-CM | POA: Diagnosis not present

## 2016-11-06 DIAGNOSIS — L97929 Non-pressure chronic ulcer of unspecified part of left lower leg with unspecified severity: Secondary | ICD-10-CM | POA: Diagnosis present

## 2016-11-06 DIAGNOSIS — D638 Anemia in other chronic diseases classified elsewhere: Secondary | ICD-10-CM | POA: Diagnosis present

## 2016-11-06 DIAGNOSIS — Z79899 Other long term (current) drug therapy: Secondary | ICD-10-CM

## 2016-11-06 DIAGNOSIS — Z961 Presence of intraocular lens: Secondary | ICD-10-CM | POA: Diagnosis present

## 2016-11-06 DIAGNOSIS — Z7901 Long term (current) use of anticoagulants: Secondary | ICD-10-CM

## 2016-11-06 DIAGNOSIS — I481 Persistent atrial fibrillation: Secondary | ICD-10-CM | POA: Diagnosis present

## 2016-11-06 DIAGNOSIS — J189 Pneumonia, unspecified organism: Secondary | ICD-10-CM | POA: Diagnosis present

## 2016-11-06 DIAGNOSIS — Z950 Presence of cardiac pacemaker: Secondary | ICD-10-CM

## 2016-11-06 DIAGNOSIS — I480 Paroxysmal atrial fibrillation: Secondary | ICD-10-CM | POA: Diagnosis present

## 2016-11-06 DIAGNOSIS — I5084 End stage heart failure: Secondary | ICD-10-CM | POA: Diagnosis present

## 2016-11-06 DIAGNOSIS — I5022 Chronic systolic (congestive) heart failure: Secondary | ICD-10-CM | POA: Diagnosis not present

## 2016-11-06 DIAGNOSIS — Z993 Dependence on wheelchair: Secondary | ICD-10-CM

## 2016-11-06 DIAGNOSIS — I255 Ischemic cardiomyopathy: Secondary | ICD-10-CM | POA: Diagnosis not present

## 2016-11-06 DIAGNOSIS — D649 Anemia, unspecified: Secondary | ICD-10-CM | POA: Diagnosis present

## 2016-11-06 DIAGNOSIS — I130 Hypertensive heart and chronic kidney disease with heart failure and stage 1 through stage 4 chronic kidney disease, or unspecified chronic kidney disease: Principal | ICD-10-CM | POA: Diagnosis present

## 2016-11-06 DIAGNOSIS — J9602 Acute respiratory failure with hypercapnia: Secondary | ICD-10-CM | POA: Diagnosis not present

## 2016-11-06 DIAGNOSIS — N179 Acute kidney failure, unspecified: Secondary | ICD-10-CM | POA: Diagnosis present

## 2016-11-06 DIAGNOSIS — I5043 Acute on chronic combined systolic (congestive) and diastolic (congestive) heart failure: Secondary | ICD-10-CM | POA: Diagnosis present

## 2016-11-06 DIAGNOSIS — I714 Abdominal aortic aneurysm, without rupture: Secondary | ICD-10-CM | POA: Diagnosis present

## 2016-11-06 DIAGNOSIS — I248 Other forms of acute ischemic heart disease: Secondary | ICD-10-CM | POA: Diagnosis present

## 2016-11-06 DIAGNOSIS — Z515 Encounter for palliative care: Secondary | ICD-10-CM | POA: Diagnosis not present

## 2016-11-06 DIAGNOSIS — K219 Gastro-esophageal reflux disease without esophagitis: Secondary | ICD-10-CM | POA: Diagnosis present

## 2016-11-06 DIAGNOSIS — G4700 Insomnia, unspecified: Secondary | ICD-10-CM | POA: Diagnosis present

## 2016-11-06 DIAGNOSIS — L97919 Non-pressure chronic ulcer of unspecified part of right lower leg with unspecified severity: Secondary | ICD-10-CM | POA: Diagnosis not present

## 2016-11-06 DIAGNOSIS — E039 Hypothyroidism, unspecified: Secondary | ICD-10-CM | POA: Diagnosis not present

## 2016-11-06 DIAGNOSIS — I5023 Acute on chronic systolic (congestive) heart failure: Secondary | ICD-10-CM | POA: Diagnosis not present

## 2016-11-06 DIAGNOSIS — R338 Other retention of urine: Secondary | ICD-10-CM | POA: Diagnosis present

## 2016-11-06 DIAGNOSIS — Z9841 Cataract extraction status, right eye: Secondary | ICD-10-CM

## 2016-11-06 DIAGNOSIS — Z9981 Dependence on supplemental oxygen: Secondary | ICD-10-CM

## 2016-11-06 DIAGNOSIS — J810 Acute pulmonary edema: Secondary | ICD-10-CM | POA: Diagnosis not present

## 2016-11-06 DIAGNOSIS — J9801 Acute bronchospasm: Secondary | ICD-10-CM | POA: Diagnosis present

## 2016-11-06 DIAGNOSIS — Z87891 Personal history of nicotine dependence: Secondary | ICD-10-CM

## 2016-11-06 DIAGNOSIS — J811 Chronic pulmonary edema: Secondary | ICD-10-CM

## 2016-11-06 LAB — BASIC METABOLIC PANEL
ANION GAP: 13 (ref 5–15)
BUN: 34 mg/dL — AB (ref 6–20)
CALCIUM: 9.2 mg/dL (ref 8.9–10.3)
CO2: 20 mmol/L — AB (ref 22–32)
CREATININE: 2.09 mg/dL — AB (ref 0.61–1.24)
Chloride: 104 mmol/L (ref 101–111)
GFR calc Af Amer: 30 mL/min — ABNORMAL LOW (ref >60)
GFR calc non Af Amer: 26 mL/min — ABNORMAL LOW (ref >60)
GLUCOSE: 94 mg/dL (ref 65–99)
Potassium: 3.7 mmol/L (ref 3.5–5.1)
Sodium: 137 mmol/L (ref 135–145)

## 2016-11-06 LAB — CBC
HCT: 37.2 % — ABNORMAL LOW (ref 39.0–52.0)
HEMOGLOBIN: 12.3 g/dL — AB (ref 13.0–17.0)
MCH: 33.1 pg (ref 26.0–34.0)
MCHC: 33.1 g/dL (ref 30.0–36.0)
MCV: 100 fL (ref 78.0–100.0)
Platelets: 162 10*3/uL (ref 150–400)
RBC: 3.72 MIL/uL — ABNORMAL LOW (ref 4.22–5.81)
RDW: 19.2 % — AB (ref 11.5–15.5)
WBC: 6.3 10*3/uL (ref 4.0–10.5)

## 2016-11-06 LAB — I-STAT ARTERIAL BLOOD GAS, ED
Acid-Base Excess: 2 mmol/L (ref 0.0–2.0)
BICARBONATE: 25.5 mmol/L (ref 20.0–28.0)
O2 Saturation: 98 %
TCO2: 27 mmol/L (ref 22–32)
pCO2 arterial: 36.3 mmHg (ref 32.0–48.0)
pH, Arterial: 7.456 — ABNORMAL HIGH (ref 7.350–7.450)
pO2, Arterial: 93 mmHg (ref 83.0–108.0)

## 2016-11-06 LAB — BRAIN NATRIURETIC PEPTIDE: B Natriuretic Peptide: 940.4 pg/mL — ABNORMAL HIGH (ref 0.0–100.0)

## 2016-11-06 LAB — TROPONIN I: Troponin I: 0.03 ng/mL (ref <0.03)

## 2016-11-06 LAB — I-STAT TROPONIN, ED: TROPONIN I, POC: 0.03 ng/mL (ref 0.00–0.08)

## 2016-11-06 MED ORDER — FUROSEMIDE 10 MG/ML IJ SOLN: 120.0000 mg | Freq: Two times a day (BID) | INTRAMUSCULAR | Status: DC

## 2016-11-06 MED ORDER — ACETAMINOPHEN 325 MG PO TABS
650.0000 mg | ORAL_TABLET | ORAL | Status: DC | PRN
Start: 2016-11-06 — End: 2016-11-21
  Administered 2016-11-08: 650 mg via ORAL
  Filled 2016-11-06: qty 2

## 2016-11-06 MED ORDER — OMEGA-3-ACID ETHYL ESTERS 1 G PO CAPS
1.0000 g | ORAL_CAPSULE | Freq: Every day | ORAL | Status: DC
  Administered 2016-11-07 – 2016-11-20 (×14): 1 g via ORAL
  Filled 2016-11-06 (×15): qty 1

## 2016-11-06 MED ORDER — NITROGLYCERIN 0.4 MG SL SUBL: 0.4000 mg | SUBLINGUAL_TABLET | SUBLINGUAL | Status: DC | PRN

## 2016-11-06 MED ORDER — IPRATROPIUM-ALBUTEROL 0.5-2.5 (3) MG/3ML IN SOLN
3.0000 mL | Freq: Three times a day (TID) | RESPIRATORY_TRACT | Status: DC
  Administered 2016-11-06 – 2016-11-08 (×6): 3 mL via RESPIRATORY_TRACT
  Filled 2016-11-06 (×6): qty 3

## 2016-11-06 MED ORDER — SODIUM CHLORIDE 0.9% FLUSH: 3.0000 mL | INTRAVENOUS | Status: DC | PRN

## 2016-11-06 MED ORDER — OMEGA-3 FATTY ACIDS 1000 MG PO CAPS: 1.0000 g | ORAL_CAPSULE | Freq: Every morning | ORAL | Status: DC

## 2016-11-06 MED ORDER — LEVOTHYROXINE SODIUM 75 MCG PO TABS
75.0000 ug | ORAL_TABLET | Freq: Every day | ORAL | Status: DC
  Administered 2016-11-07 – 2016-11-20 (×14): 75 ug via ORAL
  Filled 2016-11-06 (×16): qty 1

## 2016-11-06 MED ORDER — SODIUM CHLORIDE 0.9 % IV SOLN
250.0000 mL | INTRAVENOUS | Status: DC | PRN
  Administered 2016-11-06 – 2016-11-07 (×2): 250 mL via INTRAVENOUS

## 2016-11-06 MED ORDER — FUROSEMIDE 10 MG/ML IJ SOLN
80.0000 mg | Freq: Two times a day (BID) | INTRAMUSCULAR | Status: DC
Start: 2016-11-06 — End: 2016-11-06

## 2016-11-06 MED ORDER — TAMSULOSIN HCL 0.4 MG PO CAPS
0.4000 mg | ORAL_CAPSULE | Freq: Every day | ORAL | Status: DC
  Administered 2016-11-07 – 2016-11-20 (×14): 0.4 mg via ORAL
  Filled 2016-11-06 (×15): qty 1

## 2016-11-06 MED ORDER — ONDANSETRON HCL 4 MG/2ML IJ SOLN: 4.0000 mg | Freq: Four times a day (QID) | INTRAMUSCULAR | Status: DC | PRN

## 2016-11-06 MED ORDER — CARVEDILOL 3.125 MG PO TABS
3.1250 mg | ORAL_TABLET | Freq: Two times a day (BID) | ORAL | Status: DC
  Administered 2016-11-06 – 2016-11-10 (×9): 3.125 mg via ORAL
  Filled 2016-11-06 (×10): qty 1

## 2016-11-06 MED ORDER — AMIODARONE HCL 100 MG PO TABS
100.0000 mg | ORAL_TABLET | Freq: Every day | ORAL | Status: DC
  Administered 2016-11-07 – 2016-11-10 (×4): 100 mg via ORAL
  Filled 2016-11-06 (×5): qty 1

## 2016-11-06 MED ORDER — FUROSEMIDE 10 MG/ML IJ SOLN
60.0000 mg | Freq: Once | INTRAMUSCULAR | Status: AC
  Administered 2016-11-06: 60 mg via INTRAVENOUS
  Filled 2016-11-06: qty 6

## 2016-11-06 MED ORDER — APIXABAN 2.5 MG PO TABS
2.5000 mg | ORAL_TABLET | Freq: Two times a day (BID) | ORAL | Status: DC
  Administered 2016-11-06 – 2016-11-20 (×29): 2.5 mg via ORAL
  Filled 2016-11-06 (×30): qty 1

## 2016-11-06 MED ORDER — FUROSEMIDE 10 MG/ML IJ SOLN
120.0000 mg | Freq: Two times a day (BID) | INTRAMUSCULAR | Status: AC
  Administered 2016-11-06 – 2016-11-08 (×4): 120 mg via INTRAVENOUS
  Filled 2016-11-06 (×2): qty 12
  Filled 2016-11-06 (×2): qty 2

## 2016-11-06 MED ORDER — DEXTROSE 5 % IV SOLN
2.0000 g | Freq: Once | INTRAVENOUS | Status: AC
  Administered 2016-11-06: 2 g via INTRAVENOUS
  Filled 2016-11-06: qty 2

## 2016-11-06 MED ORDER — ATORVASTATIN CALCIUM 40 MG PO TABS
40.0000 mg | ORAL_TABLET | Freq: Every day | ORAL | Status: DC
  Administered 2016-11-06 – 2016-11-20 (×14): 40 mg via ORAL
  Filled 2016-11-06 (×15): qty 1

## 2016-11-06 MED ORDER — VANCOMYCIN HCL 10 G IV SOLR
1750.0000 mg | Freq: Once | INTRAVENOUS | Status: AC
  Administered 2016-11-06: 1750 mg via INTRAVENOUS
  Filled 2016-11-06: qty 1750

## 2016-11-06 MED ORDER — SODIUM CHLORIDE 0.9% FLUSH
3.0000 mL | Freq: Two times a day (BID) | INTRAVENOUS | Status: DC
Start: 2016-11-06 — End: 2016-11-11
  Administered 2016-11-06 – 2016-11-10 (×9): 3 mL via INTRAVENOUS

## 2016-11-06 MED ORDER — VANCOMYCIN HCL IN DEXTROSE 1-5 GM/200ML-% IV SOLN
1000.0000 mg | INTRAVENOUS | Status: DC
  Administered 2016-11-07 – 2016-11-08 (×2): 1000 mg via INTRAVENOUS
  Filled 2016-11-06 (×3): qty 200

## 2016-11-06 MED ORDER — ADULT MULTIVITAMIN W/MINERALS CH
1.0000 | ORAL_TABLET | Freq: Every day | ORAL | Status: DC
  Administered 2016-11-07 – 2016-11-20 (×14): 1 via ORAL
  Filled 2016-11-06 (×15): qty 1

## 2016-11-06 MED ORDER — FAMOTIDINE 20 MG PO TABS
20.0000 mg | ORAL_TABLET | Freq: Every day | ORAL | Status: DC
  Administered 2016-11-07 – 2016-11-20 (×14): 20 mg via ORAL
  Filled 2016-11-06 (×14): qty 1

## 2016-11-06 MED ORDER — DEXTROSE 5 % IV SOLN
1.0000 g | INTRAVENOUS | Status: DC
  Administered 2016-11-07 – 2016-11-08 (×2): 1 g via INTRAVENOUS
  Filled 2016-11-06 (×2): qty 1

## 2016-11-06 NOTE — Progress Notes (Signed)
Pharmacy Antibiotic Note  Connor Diaz is a 83 y.o. male admitted on 11/06/2016 with pneumonia.  Pharmacy has been consulted for vancomycin/cefepime dosing. WBC wnl, SCr 2.09 on admit, CrCl~28.  Plan: Cefepime 2g IV x 1; then 1g IV q24h Vancomycin 1750mg  IV x1; then 1g IV q24h Monitor clinical progress, c/s, renal function F/u de-escalation plan/LOT, vancomycin trough as indicated      No data recorded.   Recent Labs Lab 11/06/16 1106  WBC 6.3    CrCl cannot be calculated (Patient's most recent lab result is older than the maximum 21 days allowed.).    Allergies  Allergen Reactions  . Iodinated Diagnostic Agents Rash and Swelling    Swelling in face, urticaria. Received Isovoc 370 (non-ionic) on 10/24/14.  . Isovue [Iopamidol] Itching and Swelling    Pt had slight erythema, itching and facial swelling right ear and right lower chin.  Pt sneezed several times after contrast injection.  Pt was monitored for 1 hr post injection and was given water to drink. Dr Gery Pray feels he had a very mild reaction.  Premeds are warranted in this patient.  Thanks, J Bohm    Antimicrobials this admission: 9/21 vancomycin >>  9/21 cefepime >>   Dose adjustments this admission:   Microbiology results:   Babs Bertin, PharmD, BCPS Clinical Pharmacist 11/06/2016 12:47 PM

## 2016-11-06 NOTE — Progress Notes (Signed)
Pt transported on Bipap from ED to 2C09.  Pt's vitals remained stable throughout.

## 2016-11-06 NOTE — ED Provider Notes (Addendum)
[facility]-EMERGENCY DEPT Provider Note   CSN: [id number] Arrival date & time: 11/06/16  1057     History   Chief Complaint Chief Complaint  Patient presents with  . Shortness of Breath    HPI Connor Diaz is a 83 y.o. male.  HPI Patient is a 83 year old male history known congestive heart failure last EF was noted to be 25-30%.  He presents emergency department with increasing lower extremity swelling and increasing shortness of breath over the past several days.  He presents into triage with cyanosis of his lips and O2 sats of 64%.  He presents the ER today because of worsening shortness of breath.  He feels as though he's gained a lot of fluid on both his legs and his lungs over the past several days despite home diuretic use.  Compliant with his medications.  Known history of paroxysmal atrial fibrillation.  Patient is on anticoagulation and amiodarone.  Patient has a history of chronic renal insufficiency.  Reports cough without productive nature.  No fevers or chills.  No mental status changes   Past Medical History:  Diagnosis Date  . AAA (abdominal aortic aneurysm) (HCC)    a. 02/2009 U/S 4.4 x 4.5 cm  . BPH (benign prostatic hyperplasia)   . CKD (chronic kidney disease), stage III   . Coronary artery disease   . DJD (degenerative joint disease)   . History of tobacco abuse    a. roughly 36 pack years, quit @ age 30.  Marland Kitchen Hypertension   . Hypothyroidism   . Non-ischemic cardiomyopathy (HCC)   . Pancreatitis    a. 10/2011 - managed conservatively @ home.  . Persistent atrial fibrillation (HCC)   . Symptomatic bradycardia    a. s/p STJ dual chamber PPM   . Systolic heart failure (HCC)    02/2013    Patient Active Problem List   Diagnosis Date Noted  . Hematuria 10/04/2016  . Chronic respiratory failure with hypoxia (HCC) 10/04/2016  . Acute renal failure superimposed on stage 3 chronic kidney disease (HCC) 10/04/2016  . Chronic systolic CHF (congestive heart failure)  (HCC) 10/04/2016  . Abdominal aortic aneurysm (AAA) without rupture (HCC)   . Prostate hypertrophy   . Pressure injury of skin 05/22/2016  . Acute systolic CHF (congestive heart failure) (HCC) 05/21/2016  . Acute on chronic respiratory failure with hypoxia (HCC) 05/21/2016  . ARF (acute renal failure) (HCC) 05/21/2016  . Intertrochanteric fracture of right hip (HCC) 04/19/2016  . Malnutrition of moderate degree 04/17/2016  . Diarrhea 04/14/2016  . CKD (chronic kidney disease) stage 3, GFR 30-59 ml/min 04/14/2016  . Paroxysmal atrial fibrillation (HCC) 09/27/2015  . Non-ischemic cardiomyopathy (HCC)   . Respiratory failure with hypoxia (HCC) 02/24/2013  . Bronchopneumonia 02/22/2013  . SOB (shortness of breath) 02/22/2013  . Acute combined systolic and diastolic heart failure (HCC) 02/22/2013  . CAP (community acquired pneumonia) 02/22/2013  . Cellulitis of left lower extremity 02/22/2013  . Aneurysm of abdominal vessel (HCC) 03/07/2012  . Bradycardia 03/04/2012  . CAD (coronary artery disease) 03/04/2012  . Pacemaker-St. Jude 11/23/2011    Past Surgical History:  Procedure Laterality Date  . CATARACT EXTRACTION W/ INTRAOCULAR LENS  IMPLANT, BILATERAL Bilateral 1980's  . CORONARY ANGIOGRAM  11/19/2011   Procedure: CORONARY ANGIOGRAM;  Surgeon: [name], MD;  Location[facility] CATH LAB;  Service: Cardiovascular;;  . CORONARY ANGIOPLASTY WITH STENT PLACEMENT  12/22/2011    LAD & CIRCUMFLEX  . EYE SURGERY    . INTRAMEDULLARY (  IM) NAIL INTERTROCHANTERIC Right 04/19/2016   Procedure: INTRAMEDULLARY (IM) NAIL INTERTROCHANTRIC;  Surgeon: Sheral Apley, MD;  Location: [facility] OR;  Service: Orthopedics;  Laterality: Right;  . PERCUTANEOUS CORONARY STENT INTERVENTION (PCI-S) N/A 12/22/2011   Procedure: PERCUTANEOUS CORONARY STENT INTERVENTION (PCI-S);  Surgeon: [name], MD;  Location[facility] CATH LAB;  Service: Cardiovascular;  Laterality: N/A;  . PERMANENT PACEMAKER INSERTION N/A  11/20/2011   STJ dual chamber PPM implanted by [name] for symptomatic bradycardia   . TONSILLECTOMY  ~ 1933       Home Medications    Prior to Admission medications   Medication Sig Start Date End Date Taking? Authorizing Provider  allopurinol (ZYLOPRIM) 100 MG tablet Take 1 tablet (100 mg total) by mouth daily. 04/23/16  Yes Rai, Ripudeep K, MD  amiodarone (PACERONE) 200 MG tablet Take 100 mg by mouth daily.   Yes [provider]  apixaban (ELIQUIS) 2.5 MG TABS tablet Take 2.5 mg by mouth 2 (two) times daily.   Yes [provider]  atorvastatin (LIPITOR) 80 MG tablet Take 40 mg by mouth daily at 6 PM.  11/23/11  Yes Rinaldo Cloud, MD  carvedilol (COREG) 3.125 MG tablet Take 1 tablet (3.125 mg total) by mouth 2 (two) times daily with a meal. 04/23/16  Yes Rai, Ripudeep K, MD  famotidine (PEPCID) 20 MG tablet Take 20 mg by mouth daily before breakfast.  12/23/11  Yes Rinaldo Cloud, MD  fish oil-omega-3 fatty acids 1000 MG capsule Take 1 g by mouth every morning.    Yes [provider]  furosemide (LASIX) 20 MG tablet Take 2 tablets (40 mg) in the morning and 1 tablet (20 mg) in the afternoon Patient taking differently: 40 mg 2 (two) times daily.  07/15/16  Yes Allred, Fayrene Fearing, MD  ipratropium-albuterol (DUONEB) 0.5-2.5 (3) MG/3ML SOLN Take 3 mLs by nebulization 3 (three) times daily. 04/23/16  Yes Rai, Ripudeep K, MD  levothyroxine (SYNTHROID, LEVOTHROID) 75 MCG tablet Take 75 mcg by mouth daily before breakfast. 04/30/14  Yes [provider]  Multiple Vitamin (MULTIVITAMIN WITH MINERALS) TABS tablet Take 1 tablet by mouth daily. Patient taking differently: Take 1 tablet by mouth 2 (two) times daily.  04/23/16  Yes Rai, Ripudeep K, MD  tamsulosin (FLOMAX) 0.4 MG CAPS capsule Take 0.4 mg by mouth daily.   Yes [provider]  VENTOLIN HFA 108 (90 Base) MCG/ACT inhaler Take 2 puffs by mouth every 6 (six) hours as needed for shortness of breath or wheezing.  10/23/16  Yes [provider]  ciprofloxacin (CIPRO) 500 MG tablet Take 1 tablet (500 mg total) by mouth daily. 10/05/16   Oval Linsey, MD  nitroGLYCERIN (NITROSTAT) 0.4 MG SL tablet Place 1 tablet (0.4 mg total) under the tongue every 5 (five) minutes x 3 doses as needed for chest pain. 11/23/11   Rinaldo Cloud, MD    Family History Family History  Problem Relation Age of Onset  . Other Father        Accidental death @ age 75 - box fell on him at work  . Cancer Mother        died @ 32 Bladder cancer    Social History Social History  Substance Use Topics  . Smoking status: Former Smoker    Packs/day: 1.00    Years: 35.00    Types: Cigarettes    Quit date: 12/17/1980  . Smokeless tobacco: Never Used     Comment: Quit at age 27.  Marland Kitchen  Alcohol use No     Allergies   Iodinated diagnostic agents and Isovue [iopamidol]   Review of Systems Review of Systems  All other systems reviewed and are negative.    Physical Exam Updated Vital Signs BP 119/87   Pulse 88   Resp (!) 24   Ht  (1.778 m)   Wt 86 kg (189 lb 9.5 oz)   SpO2 98%   BMI 27.20 kg/m   Physical Exam  Constitutional: He is oriented to person, place, and time. He appears well-developed. He appears distressed.  HENT:  Head: Normocephalic and atraumatic.  Eyes: EOM are normal.  Neck: Normal range of motion.  Cardiovascular: Normal rate, regular rhythm, normal heart sounds and intact distal pulses.   Pulmonary/Chest:  Tachypnea.  Accessory muscle use.  Decreased breath sounds throughout all lung fields.  No wheezing  Abdominal: Soft. He exhibits no distension. There is no tenderness.  Musculoskeletal: Normal range of motion.  2+ edema bilaterally  Neurological: He is alert and oriented to person, place, and time.  Skin: Skin is warm and dry.  Psychiatric: He has a normal mood and affect. Judgment normal.  Nursing note and vitals reviewed.    ED Treatments / Results  Labs (all labs  ordered are listed, but only abnormal results are displayed) Labs Reviewed  BASIC METABOLIC PANEL - Abnormal; Notable for the following:       Result Value   CO2 20 (*)    BUN 34 (*)    Creatinine, Ser 2.09 (*)    GFR calc non Af Amer 26 (*)    GFR calc Af Amer 30 (*)    All other components within normal limits  CBC - Abnormal; Notable for the following:    RBC 3.72 (*)    Hemoglobin 12.3 (*)    HCT 37.2 (*)    RDW 19.2 (*)    All other components within normal limits  BRAIN NATRIURETIC PEPTIDE - Abnormal; Notable for the following:    B Natriuretic Peptide 940.4 (*)    All other components within normal limits  TROPONIN I - Abnormal; Notable for the following:    Troponin I 0.03 (*)    All other components within normal limits  I-STAT ARTERIAL BLOOD GAS, ED - Abnormal; Notable for the following:    pH, Arterial 7.456 (*)    All other components within normal limits  I-STAT TROPONIN, ED    EKG  EKG Interpretation  Date/Time:  Friday November 06 2016 11:03:16 EDT Ventricular Rate:  97 PR Interval:    QRS Duration: 172 QT Interval:  410 QTC Calculation: 520 R Axis:   154 Text Interpretation:  Atrial fibrillation with premature ventricular or aberrantly conducted complexes Right bundle branch block Abnormal ECG No significant change was found Confirmed by Azalia Bilis ([id number]) on 11/06/2016 1:22:04 PM       Radiology Dg Chest Portable 1 View  Result Date: 11/06/2016 CLINICAL DATA:  Shortness of breath. EXAM: PORTABLE CHEST 1 VIEW COMPARISON:  Jul 07, 2016 FINDINGS: Study is limited due to rotated lordotic positioning. Cardiomegaly is noted. The hila and mediastinum are unchanged. Mild opacity in the left base is slightly increased in the interval. Opacity in the right base has increased. Mild interstitial prominence on the right. No other acute abnormalities. IMPRESSION: 1. Increasing bibasilar opacities, right greater than left. Developing pneumonia not excluded in the  right base. Probable atelectasis in the left base. Electronically Signed   By: Gerome Sam III  M.D   On: 11/06/2016 12:10    Procedures .Critical Care Performed by: Azalia Bilis Authorized by: Azalia Bilis      Total critical care time: 35 minutes Critical care time was exclusive of separately billable procedures and treating other patients. Critical care was necessary to treat or prevent imminent or life-threatening deterioration. Critical care was time spent personally by me on the following activities: development of treatment plan with patient and/or surrogate as well as nursing, discussions with consultants, evaluation of patient's response to treatment, examination of patient, obtaining history from patient or surrogate, ordering and performing treatments and interventions, ordering and review of laboratory studies, ordering and review of radiographic studies, pulse oximetry and re-evaluation of patient's condition.   Medications Ordered in ED Medications  furosemide (LASIX) injection 60 mg (not administered)  ceFEPIme (MAXIPIME) 2 g in dextrose 5 % 50 mL IVPB (not administered)  vancomycin (VANCOCIN) 1,750 mg in sodium chloride 0.9 % 500 mL IVPB (not administered)     Initial Impression / Assessment and Plan / ED Course  I have reviewed the triage vital signs and the nursing notes.  Pertinent labs & imaging results that were available during my care of the patient were reviewed by me and considered in my medical decision making (see chart for details).     Patient with respiratory distress and evidence of respiratory failure on arrival to the emergency department.  Patient profoundly hypoxic.  Placed on nonrebreather.  This was followed by BiPAP with improvement in his respiratory distress.  Chest x-ray concerning for possible developing pneumonia versus congestive heart failure.  Likely both issues occurring.  Patient given broad-spectrum antibiotics.  IV diuresis now.   Patient likely will be open to be weaned off BiPAP shortly.  Patient be admitted to the stepdown unit.  Final Clinical Impressions(s) / ED Diagnoses   Final diagnoses:  Acute respiratory failure with hypoxia (HCC)  Acute on chronic congestive heart failure, unspecified heart failure type [facility])    New Prescriptions New Prescriptions   No medications on file     Azalia Bilis, MD 11/06/16 1307    Azalia Bilis, MD 11/06/16 1322

## 2016-11-06 NOTE — ED Triage Notes (Signed)
Pt presents to the ed with complaints of shortness of breath, pt is in distress in triage with circumoral cyanosis. Pt 64% on RA, placed on non-re breather and oxygen up to 97%. Pt states that he has gained a lot of fluid on in the last few days.

## 2016-11-06 NOTE — H&P (Signed)
History and Physical:    Connor Diaz   ZOX:[id number] DOB: Mar 25, 1923 DOA: 11/06/2016  Referring MD/provider: Dr Patria Mane PCP: Oval Linsey, MD   Patient coming from: Home  Chief Complaint: Marketed worsening of subacute shortness of breath.  History of Present Illness:   Connor Diaz is an 83 y.o. male with past medical history of known systolic heart failure with an EF of 25-30%, atrial fibrillation, coronary artery disease who was in his usual state of excellent health until March 2018 when he sustained a hip fracture. Since then patient and family note that he has been "declining"with multiple intervening acute medical problems. Patient is presently wheelchair bound due to fatigue and debilitation. He notes that he has been having a slow progressive decrease in his exercise tolerance over the past several months which has increased over the past week. Patient notes that over the past week he has been short of breath even at rest which is unusual for him. He admits to orthopnea and PND at home. Patient has been managed by his PCP with Lasix 40 twice a day and has been told that the Lasix dose was conservative due to concerns about kidney function. Patient is also noted increasing abdominal girth and markedly lower extremity edema which has been treated with wrapping of legs.  This morning patient was markedly short of breath and gasping for air at rest. He had an outpatient M.D. appointment and was told to come to the ED from that appointment. Patient denies any acute chest or shoulder or jaw pain.  Patient denies fevers or chills. No malaise other than his acutely worsening shortness of breath. No sweats. Patient has baseline cough but that has not really changed from usual. No sputum production.  ED Course:  The patient was noted to have 5 basilar opacification right greater than left with concern for possible pneumonia. Patient was started on BiPAP with market improvement  in symptomatology. He was treated with cefepime and vancomycin. He was also given Lasix 60 mg IV 1 with modest output of maybe 300 mL.  ROS:   ROS   Review of Systems: General: No fever, chills, weight changes Skin: No rashes, lesions, wounds Eyes: no discharge, redness, pain HENT: no ear pain, hearing loss, drainage, tinnitus Endocrine: no heat/cold intolerance, no polyuria Cardiovascular: No palpitations, chest pain GI: No nausea, vomiting, diarrhea, constipation GU: No dysuria, increased frequency CNS: No numbness, dizziness, headache Musculoskeletal: No back pain, joint pain Blood/lymphatics: No easy bruising, bleeding Mood/affect: No anxiety/depression    Past Medical History:   Past Medical History:  Diagnosis Date  . AAA (abdominal aortic aneurysm) (HCC)    a. 02/2009 U/S 4.4 x 4.5 cm  . BPH (benign prostatic hyperplasia)   . CKD (chronic kidney disease), stage III   . Coronary artery disease   . DJD (degenerative joint disease)   . History of tobacco abuse    a. roughly 36 pack years, quit @ age 37.  Marland Kitchen Hypertension   . Hypothyroidism   . Non-ischemic cardiomyopathy (HCC)   . Pancreatitis    a. 10/2011 - managed conservatively @ home.  . Persistent atrial fibrillation (HCC)   . Symptomatic bradycardia    a. s/p STJ dual chamber PPM   . Systolic heart failure (HCC)    02/2013    Past Surgical History:   Past Surgical History:  Procedure Laterality Date  . CATARACT EXTRACTION W/ INTRAOCULAR LENS  IMPLANT, BILATERAL Bilateral 1980's  . CORONARY ANGIOGRAM  11/19/2011   Procedure: CORONARY ANGIOGRAM;  Surgeon: [name], MD;  Location: [facility] CATH LAB;  Service: Cardiovascular;;  . CORONARY ANGIOPLASTY WITH STENT PLACEMENT  12/22/2011    LAD & CIRCUMFLEX  . EYE SURGERY    . INTRAMEDULLARY (IM) NAIL INTERTROCHANTERIC Right 04/19/2016   Procedure: INTRAMEDULLARY (IM) NAIL INTERTROCHANTRIC;  Surgeon: Sheral Apley, MD;  Location: [facility] OR;  Service: Orthopedics;   Laterality: Right;  . PERCUTANEOUS CORONARY STENT INTERVENTION (PCI-S) N/A 12/22/2011   Procedure: PERCUTANEOUS CORONARY STENT INTERVENTION (PCI-S);  Surgeon: [name], MD;  Location: [facility] CATH LAB;  Service: Cardiovascular;  Laterality: N/A;  . PERMANENT PACEMAKER INSERTION N/A 11/20/2011   STJ dual chamber PPM implanted by [name] for symptomatic bradycardia   . TONSILLECTOMY  ~ 40    Social History:   Social History   Social History  . Marital status: Married    Spouse name: N/A  . Number of children: N/A  . Years of education: N/A   Occupational History  . retired    Social History Main Topics  . Smoking status: Former Smoker    Packs/day: 1.00    Years: 35.00    Types: Cigarettes    Quit date: 12/17/1980  . Smokeless tobacco: Never Used     Comment: Quit at age 17.  Marland Kitchen Alcohol use No  . Drug use: No  . Sexual activity: Not Currently   Other Topics Concern  . Not on file   Social History Narrative   Pt lives in [place] with his wife.  He is retired from [employer].  He is relatively active @ home.    Allergies   Iodinated diagnostic agents and Isovue [iopamidol]  Family history:   Family History  Problem Relation Age of Onset  . Other Father        Accidental death @ age 38 - box fell on him at work  . Cancer Mother        died @ 9 Bladder cancer    Current Medications:   Prior to Admission medications   Medication Sig Start Date End Date Taking? Authorizing Provider  allopurinol (ZYLOPRIM) 100 MG tablet Take 1 tablet (100 mg total) by mouth daily. 04/23/16  Yes Rai, Ripudeep K, MD  amiodarone (PACERONE) 200 MG tablet Take 100 mg by mouth daily.   Yes [provider]  apixaban (ELIQUIS) 2.5 MG TABS tablet Take 2.5 mg by mouth 2 (two) times daily.   Yes [provider]  atorvastatin (LIPITOR) 80 MG tablet Take 40 mg by mouth daily at 6 PM.  11/23/11  Yes Rinaldo Cloud, MD  carvedilol (COREG) 3.125 MG tablet Take 1  tablet (3.125 mg total) by mouth 2 (two) times daily with a meal. 04/23/16  Yes Rai, Ripudeep K, MD  famotidine (PEPCID) 20 MG tablet Take 20 mg by mouth daily before breakfast.  12/23/11  Yes Rinaldo Cloud, MD  fish oil-omega-3 fatty acids 1000 MG capsule Take 1 g by mouth every morning.    Yes [provider]  furosemide (LASIX) 20 MG tablet Take 2 tablets (40 mg) in the morning and 1 tablet (20 mg) in the afternoon Patient taking differently: 40 mg 2 (two) times daily.  07/15/16  Yes Allred, Fayrene Fearing, MD  ipratropium-albuterol (DUONEB) 0.5-2.5 (3) MG/3ML SOLN Take 3 mLs by nebulization 3 (three) times daily. 04/23/16  Yes Rai, Ripudeep K, MD  levothyroxine (SYNTHROID, LEVOTHROID) 75 MCG tablet Take 75 mcg by mouth daily before breakfast. 04/30/14  Yes [provider]  Multiple Vitamin (MULTIVITAMIN WITH MINERALS) TABS tablet Take 1 tablet by mouth daily. Patient taking differently: Take 1 tablet by mouth 2 (two) times daily.  04/23/16  Yes Rai, Ripudeep K, MD  tamsulosin (FLOMAX) 0.4 MG CAPS capsule Take 0.4 mg by mouth daily.   Yes [provider]  VENTOLIN HFA 108 (90 Base) MCG/ACT inhaler Take 2 puffs by mouth every 6 (six) hours as needed for shortness of breath or wheezing. 10/23/16  Yes [provider]  nitroGLYCERIN (NITROSTAT) 0.4 MG SL tablet Place 1 tablet (0.4 mg total) under the tongue every 5 (five) minutes x 3 doses as needed for chest pain. 11/23/11   Rinaldo Cloud, MD    Physical Exam:   Vitals:   11/06/16 1315 11/06/16 1400 11/06/16 1430 11/06/16 1519  BP: 108/78 110/86 126/84 121/87  Pulse: (!) 59 81 87 83  Resp: 18 (!) 23 (!) 21 (!) 24  SpO2: 99% 99% 99% 100%  Weight:      Height:         Physical Exam: Blood pressure 121/87, pulse 83, resp. rate (!) 24, height  (1.778 m), weight 86 kg (189 lb 9.5 oz), SpO2 100 %. Gen: Pleasant, smiling relatively well-appearing man with BiPAP mask in place lying at 30 in no acute distress. Eyes:  Sclerae anicteric. Conjunctiva mildly injected. Neck: Supple, no jugular venous distention. Chest: Moderate air entry bilaterally with faint inspiratory and asked tori wheezes bilaterally, coarse rales at right base with an occasional rhonchus. CV: Distant, irregular, no audible murmurs. Abdomen: NABS, distended, firm but not hard, nontender, likely ascites.  Extremities: Lower extremities are wrapped bilaterally. He has 3+ edema just above where the wrap and bilaterally all the way up to his buttocks.  Skin: Warm and dry. No rashes, lesions or wounds. Neuro: Alert and oriented times 3; grossly nonfocal. Psych: Patient is cooperative, logical and coherent with appropriate mood and affect. He and family both appear to have a realistic understanding of his situation but both express desire for patient to live as long as is possible with a reasonable quality of life.  Data Review:    Labs: Basic Metabolic Panel:  Recent Labs Lab 11/06/16 1106  NA 137  K 3.7  CL 104  CO2 20*  GLUCOSE 94  BUN 34*  CREATININE 2.09*  CALCIUM 9.2   Liver Function Tests: No results for input(s): AST, ALT, ALKPHOS, BILITOT, PROT, ALBUMIN in the last 168 hours. No results for input(s): LIPASE, AMYLASE in the last 168 hours. No results for input(s): AMMONIA in the last 168 hours. CBC:  Recent Labs Lab 11/06/16 1106  WBC 6.3  HGB 12.3*  HCT 37.2*  MCV 100.0  PLT 162   Cardiac Enzymes:  Recent Labs Lab 11/06/16 1106  TROPONINI 0.03*    BNP (last 3 results) No results for input(s): PROBNP in the last 8760 hours. CBG: No results for input(s): GLUCAP in the last 168 hours.  Urinalysis    Component Value Date/Time   COLORURINE RED (A) 10/04/2016 1125   APPEARANCEUR TURBID (A) 10/04/2016 1125   LABSPEC  10/04/2016 1125    TEST NOT REPORTED DUE TO COLOR INTERFERENCE OF URINE PIGMENT   PHURINE  10/04/2016 1125    TEST NOT REPORTED DUE TO COLOR INTERFERENCE OF URINE PIGMENT   GLUCOSEU (A)  10/04/2016 1125    TEST NOT REPORTED DUE TO COLOR INTERFERENCE OF URINE PIGMENT   HGBUR (A) 10/04/2016 1125    TEST  NOT REPORTED DUE TO COLOR INTERFERENCE OF URINE PIGMENT   BILIRUBINUR (A) 10/04/2016 1125    TEST NOT REPORTED DUE TO COLOR INTERFERENCE OF URINE PIGMENT   KETONESUR (A) 10/04/2016 1125    TEST NOT REPORTED DUE TO COLOR INTERFERENCE OF URINE PIGMENT   PROTEINUR (A) 10/04/2016 1125    TEST NOT REPORTED DUE TO COLOR INTERFERENCE OF URINE PIGMENT   NITRITE (A) 10/04/2016 1125    TEST NOT REPORTED DUE TO COLOR INTERFERENCE OF URINE PIGMENT   LEUKOCYTESUR (A) 10/04/2016 1125    TEST NOT REPORTED DUE TO COLOR INTERFERENCE OF URINE PIGMENT      Radiographic Studies: Dg Chest Portable 1 View  Result Date: 11/06/2016 CLINICAL DATA:  Shortness of breath. EXAM: PORTABLE CHEST 1 VIEW COMPARISON:  Jul 07, 2016 FINDINGS: Study is limited due to rotated lordotic positioning. Cardiomegaly is noted. The hila and mediastinum are unchanged. Mild opacity in the left base is slightly increased in the interval. Opacity in the right base has increased. Mild interstitial prominence on the right. No other acute abnormalities. IMPRESSION: 1. Increasing bibasilar opacities, right greater than left. Developing pneumonia not excluded in the right base. Probable atelectasis in the left base. Electronically Signed   By: Gerome Sam III M.D   On: 11/06/2016 12:10    EKG: Independently reviewed. RBBB, atrial fibrillation and 100. No acute ST-T wave changes.   Assessment/Plan:   Principal Problem:   Acute respiratory failure with hypoxia (HCC) Active Problems:   CAD (coronary artery disease)   Acute combined systolic and diastolic heart failure (HCC)   Paroxysmal atrial fibrillation (HCC)   Prostate hypertrophy   HAP (hospital-acquired pneumonia)  ACUTE RESPIRATORY FAILURE Patient with market hypoxia O2 sat 64% on room air secondary to pulmonary edema and possible new H AP. He is doing much  better on BiPAP, O2 sat 99% on 100% O2. He is breathing comfortably and able to speak and laugh through the BiPAP mask. Will attempt to diurese patient as below, will continue vancomycin and cefepime as started in ED.  SYSTOLIC HEART FAILURE Patient with market decompensation of known systolic heart failure with EF 20-30%, he has anasarca, likely ascites and pulmonary edema. He has not responded well to Lasix 60 mg IV 1 which is not surprising given creatinine of 2.0 Will increase Lasix 120 mg and see how he diuresis to that, may well need to add metolazone and or torsemide in the future. Echocardiogram ordered, continue lisinopril and carvedilol. Heart failure consultation requested and discussed, even the patient is wheelchair-bound and has anasarca he has quite a will to live and seems to enjoy a reasonable quality of life although he and his family do realize that he is declining since his hip fracture.  HAP  chest x-ray with increasing bibasilar opacities right greater than left. He has clear rales on right base. No fever or elevated WBC may well be secondary to age. I suspect he may well be developing a HAP Agree with treatment with cefepime and vancomycin empirically. If infiltrates resolved with diuresis could discontinue empiric antibiotic.  CKD 3 Baseline 1.3-1.5, now 2.1 Clearly managing kidneys and heart while diuresing patient will be challenging. I am continuing his lisinopril given his anasarca and total body water overload, however would have low threshold for discontinuing if renal function worsens with diuresis.  TROPONIN Minimally elevated troponin most likely secondary to acute kidney failure. EKG is without any acute changes. Will trend every 6 hours to evaluate for possible demand ischemia.  AFIB Patient is on amiodarone and also has a pacemaker in place for rate control, his rate is controlled. Continue apixaban given Chadsvasc score of 5  BPH Continue  tamsulosin  AAA Increasing in size, patient and family are aware of this.  Patient confirms that he does not want any further surgical intervention at this time.  HYPOTHYROIDISM Continue levothyroxin  GERD Continue Pepcid   Patient is DO NOT RESUSCITATE     Other information:   DVT prophylaxis: Lovenox ordered. Code Status: DO NOT RESUSCITATE Family Communication: Wife and son were at bedside throughout  Disposition Plan: Home Consults called: Heart failure consultation Admission status: Inpatient   The medical decision making on this patient was of high complexity and the patient is at high risk for clinical deterioration, therefore this is a level 3 visit.   Horatio Pel Orma Flaming [facility] Pager [phone] Cell: [phone]   If 7PM-7AM, please contact night-coverage [email or web address] Password [facility] 11/06/2016, 3:37 PM

## 2016-11-06 NOTE — Consult Note (Signed)
Advanced Heart Failure Team Consult Note   Cardiology: Dr. Johney Frame  Reason for Consultation: A/C Systolic Heart Failure   HPI:    Connor Diaz is seen today for evaluation of heart failure at the request of Dr Luberta Robertson.   Connor Diaz is a 83 year old with a history of AAA, BPH, CKD Stage III, DGD, HTN, hypothyroidism, A fib since 09/2016 , R hip fracture, symptomatic bradycardia St jude dual chamber PPM , and chronic systolic heart failure.   Prior to admit weight has gone up from 177>199 pounds. Poor appetite. Limited mobility.Wheel chair bound. No recent bleeding problems.   Admitted with increased dyspnea and edema.  CXR increasing bibasilar opacities R>L and possible pneumonia RLL. Started on antibiotics. Os sats in the ED down to 60s.so bipap placed. Received 1 dose of IV lasix with poor urine output.  Pertinent admission labs include: K 3.7, sodium 137, BNP 940. WBC 6.3, and troponin 0.03.   ECHO 03/2016 EF 25-305% RV mildly reduced   Review of Systems: [y] = yes,  = no   General: Weight gain [place] ]; Weight loss ; Anorexia ; Fatigue [Y ]; Fever ; Chills ; Weakness   Cardiac: Chest pain/pressure ; Resting SOB [place] ]; Exertional SOB [Y ]; Pollyann Kennedy [place] ]; Pedal Edema [ y]; Palpitations ; Syncope ; Presyncope ; Paroxysmal nocturnal dyspnea[ ]   Pulmonary: Cough ; Wheezing[ ] ; Hemoptysis[ ] ; Sputum ; Snoring   GI: Vomiting[ ] ; Dysphagia[ ] ; Melena[ ] ; Hematochezia ; Heartburn[ ] ; Abdominal pain ; Constipation ; Diarrhea ; BRBPR   GU: Hematuria[ ] ; Dysuria ; Nocturia[ ]   Vascular: Pain in legs with walking ; Pain in feet with lying flat ; Non-healing sores ; Stroke ; TIA ; Slurred speech ;  Neuro: Headaches[ ] ; Vertigo[ ] ; Seizures[ ] ; Paresthesias[ ] ;Blurred vision ; Diplopia ; Vision changes   Ortho/Skin: Arthritis ; Joint pain [ Y]; Muscle pain ; Joint swelling ; Back Pain [Y ]; Rash     Psych: Depression[ ] ; Anxiety[ ]   Heme: Bleeding problems ; Clotting disorders ; Anemia   Endocrine: Diabetes ; Thyroid dysfunction[Y ]  Home Medications Prior to Admission medications   Medication Sig Start Date End Date Taking? Authorizing Provider  allopurinol (ZYLOPRIM) 100 MG tablet Take 1 tablet (100 mg total) by mouth daily. 04/23/16  Yes Rai, Ripudeep K, MD  amiodarone (PACERONE) 200 MG tablet Take 100 mg by mouth daily.   Yes [provider]  apixaban (ELIQUIS) 2.5 MG TABS tablet Take 2.5 mg by mouth 2 (two) times daily.   Yes [provider]  atorvastatin (LIPITOR) 80 MG tablet Take 40 mg by mouth daily at 6 PM.  11/23/11  Yes Rinaldo Cloud, MD  carvedilol (COREG) 3.125 MG tablet Take 1 tablet (3.125 mg total) by mouth 2 (two) times daily with a meal. 04/23/16  Yes Rai, Ripudeep K, MD  famotidine (PEPCID) 20 MG tablet Take 20 mg by mouth daily before breakfast.  12/23/11  Yes Rinaldo Cloud, MD  fish oil-omega-3 fatty acids 1000 MG capsule Take 1 g by mouth every morning.    Yes [provider]  furosemide (LASIX) 20 MG tablet Take 2 tablets (40 mg) in the morning and 1 tablet (20 mg) in the afternoon Patient taking differently: 40 mg  2 (two) times daily.  07/15/16  Yes Allred, Fayrene Fearing, MD  ipratropium-albuterol (DUONEB) 0.5-2.5 (3) MG/3ML SOLN Take 3 mLs by nebulization 3 (three) times daily. 04/23/16  Yes Rai, Ripudeep K, MD  levothyroxine (SYNTHROID, LEVOTHROID) 75 MCG tablet Take 75 mcg by mouth daily before breakfast. 04/30/14  Yes [provider]  Multiple Vitamin (MULTIVITAMIN WITH MINERALS) TABS tablet Take 1 tablet by mouth daily. Patient taking differently: Take 1 tablet by mouth 2 (two) times daily.  04/23/16  Yes Rai, Ripudeep K, MD  tamsulosin (FLOMAX) 0.4 MG CAPS capsule Take 0.4 mg by mouth daily.   Yes [provider]  VENTOLIN HFA 108 (90 Base) MCG/ACT inhaler Take 2 puffs by mouth every 6 (six) hours as needed for  shortness of breath or wheezing. 10/23/16  Yes [provider]  nitroGLYCERIN (NITROSTAT) 0.4 MG SL tablet Place 1 tablet (0.4 mg total) under the tongue every 5 (five) minutes x 3 doses as needed for chest pain. 11/23/11   Rinaldo Cloud, MD    Past Medical History: Past Medical History:  Diagnosis Date  . AAA (abdominal aortic aneurysm) (HCC)    a. 02/2009 U/S 4.4 x 4.5 cm  . BPH (benign prostatic hyperplasia)   . CKD (chronic kidney disease), stage III   . Coronary artery disease   . DJD (degenerative joint disease)   . History of tobacco abuse    a. roughly 36 pack years, quit @ age 89.  Marland Kitchen Hypertension   . Hypothyroidism   . Non-ischemic cardiomyopathy (HCC)   . Pancreatitis    a. 10/2011 - managed conservatively @ home.  . Persistent atrial fibrillation (HCC)   . Symptomatic bradycardia    a. s/p STJ dual chamber PPM   . Systolic heart failure (HCC)    02/2013    Past Surgical History: Past Surgical History:  Procedure Laterality Date  . CATARACT EXTRACTION W/ INTRAOCULAR LENS  IMPLANT, BILATERAL Bilateral 1980's  . CORONARY ANGIOGRAM  11/19/2011   Procedure: CORONARY ANGIOGRAM;  Surgeon: [name], MD;  Location: [facility] CATH LAB;  Service: Cardiovascular;;  . CORONARY ANGIOPLASTY WITH STENT PLACEMENT  12/22/2011    LAD & CIRCUMFLEX  . EYE SURGERY    . INTRAMEDULLARY (IM) NAIL INTERTROCHANTERIC Right 04/19/2016   Procedure: INTRAMEDULLARY (IM) NAIL INTERTROCHANTRIC;  Surgeon: Sheral Apley, MD;  Location: [facility] OR;  Service: Orthopedics;  Laterality: Right;  . PERCUTANEOUS CORONARY STENT INTERVENTION (PCI-S) N/A 12/22/2011   Procedure: PERCUTANEOUS CORONARY STENT INTERVENTION (PCI-S);  Surgeon: [name], MD;  Location: [facility] C[facility] LAB;  Service: Cardiovascular;  Laterality: N/A;  . PERMANENT PACEMAKER INSERTION N/A 11/20/2011   STJ dual chamber PPM implanted by [name] for symptomatic bradycardia   . TONSILLECTOMY  ~ 20    Family History: Family History   Problem Relation Age of Onset  . Other Father        Accidental death @ age 65 - box fell on him at work  . Cancer Mother        died @ 14 Bladder cancer    Social History: Social History   Social History  . Marital status: Married    Spouse name: N/A  . Number of children: N/A  . Years of education: N/A   Occupational History  . retired    Social History Main Topics  . Smoking status: Former Smoker    Packs/day: 1.00    Years: 35.00    Types: Cigarettes    Quit date: 12/17/1980  .  Smokeless tobacco: Never Used     Comment: Quit at age 65.  Marland Kitchen Alcohol use No  . Drug use: No  . Sexual activity: Not Currently   Other Topics Concern  . None   Social History Narrative   Pt lives in [place] with his wife.  He is retired from [employer].  He is relatively active @ home.    Allergies:  Allergies  Allergen Reactions  . Iodinated Diagnostic Agents Rash and Swelling    Swelling in face, urticaria. Received Isovoc 370 (non-ionic) on 10/24/14.  . Isovue [Iopamidol] Itching and Swelling    Pt had slight erythema, itching and facial swelling right ear and right lower chin.  Pt sneezed several times after contrast injection.  Pt was monitored for 1 hr post injection and was given water to drink. Dr Gery Pray feels he had a very mild reaction.  Premeds are warranted in this patient.  Thanks, J Bohm    Objective:    Vital Signs:   Pulse Rate:  [53-108] 87 (09/21 1430) Resp:  [15-27] 21 (09/21 1430) BP: (101-134)/(70-109) 126/84 (09/21 1430) SpO2:  [64 %-100 %] 99 % (09/21 1430) FiO2 (%):  [40 %-50 %] 40 % (09/21 1253) Weight:  [189 lb 9.5 oz (86 kg)] 189 lb 9.5 oz (86 kg) (09/21 1230)    Weight change: Filed Weights   11/06/16 1230  Weight: 189 lb 9.5 oz (86 kg)    Intake/Output:   Intake/Output Summary (Last 24 hours) at 11/06/16 1518 Last data filed at 11/06/16 1403  Gross per 24 hour  Intake               50 ml  Output                0 ml  Net                50 ml      Physical Exam    General:  Elderly appearing. On bipap HEENT: normal Neck: supple. JVP to jaw. Carotids 2+ bilat; no bruits. No lymphadenopathy or thyromegaly appreciated. Cor: PMI nondisplaced. Irregular Regular rate & rhythm. No rubs, gallops or murmurs. Lungs: clear Abdomen: soft, nontender, distended. No hepatosplenomegaly. No bruits or masses. Good bowel sounds. Extremities: no cyanosis, clubbing, rash, R and LLE 3+ edema. R and LLE compression wraps. edema Neuro: alert & orientedx3, cranial nerves grossly intact. moves all 4 extremities w/o difficulty. Affect pleasant   Telemetry   A fib 90s personally reviewed  EKG   A fib 97 bpm   Labs   Basic Metabolic Panel:  Recent Labs Lab 11/06/16 1106  NA 137  K 3.7  CL 104  CO2 20*  GLUCOSE 94  BUN 34*  CREATININE 2.09*  CALCIUM 9.2    Liver Function Tests: No results for input(s): AST, ALT, ALKPHOS, BILITOT, PROT, ALBUMIN in the last 168 hours. No results for input(s): LIPASE, AMYLASE in the last 168 hours. No results for input(s): AMMONIA in the last 168 hours.  CBC:  Recent Labs Lab 11/06/16 1106  WBC 6.3  HGB 12.3*  HCT 37.2*  MCV 100.0  PLT 162    Cardiac Enzymes:  Recent Labs Lab 11/06/16 1106  TROPONINI 0.03*    BNP: BNP (last 3 results)  Recent Labs  04/22/16 0929 05/21/16 1140 11/06/16 1106  BNP 596.7* 962.0* 940.4*    ProBNP (last 3 results) No results for input(s): PROBNP in the last 8760 hours.   CBG: No results  for input(s): GLUCAP in the last 168 hours.  Coagulation Studies: No results for input(s): LABPROT, INR in the last 72 hours.   Imaging   Dg Chest Portable 1 View  Result Date: 11/06/2016 CLINICAL DATA:  Shortness of breath. EXAM: PORTABLE CHEST 1 VIEW COMPARISON:  Jul 07, 2016 FINDINGS: Study is limited due to rotated lordotic positioning. Cardiomegaly is noted. The hila and mediastinum are unchanged. Mild opacity in the left base is  slightly increased in the interval. Opacity in the right base has increased. Mild interstitial prominence on the right. No other acute abnormalities. IMPRESSION: 1. Increasing bibasilar opacities, right greater than left. Developing pneumonia not excluded in the right base. Probable atelectasis in the left base. Electronically Signed   By: Gerome Sam III M.D   On: 11/06/2016 12:10      Medications:     Current Medications:   Infusions: . [START ON 11/07/2016] ceFEPime (MAXIPIME) IV    . vancomycin 1,750 mg (11/06/16 1443)  . [START ON 11/07/2016] vancomycin         Patient Profile  Connor Diaz is 83 year old with chronic systolic heart failure, A fib, and limited mobility.  Admitted with hypoxic respiratory failure and marked volume overload.    Assessment/Plan   1. Acute Hypoxic Respiratory Failure- Pneumonia? Currently on cefipime.  WBC ok. On bipap 2. A/C Systolic Heart Failure- Repeat ECHO   Marked volume overload. Continue lasix 120 mg twice a day.  Continue low dose bb. Hold Ace.  3. A fib - Recently converted on to Af ib in august. On eliquis 2.5 mg twice a day. Would like to restore NSR if possible. Set DC-CV.  4. Severe Deconditioning 5. DNR    Length of Stay: 0  Tonye Becket, NP  11/06/2016, 3:18 PM  Advanced Heart Failure Team Pager [phone] (M-F; 7a - 4p)  Please contact [facility] for night-coverage after hours (4p -7a ) and weekends on [email or web address]  Patient seen with NP, agree with the above note.  1. Acute on chronic systolic CHF: Ischemic cardiomyopathy.  Echo (2/18) with EF 25-30%, mildly decreased RV systolic function.  He has developed symptoms and lower extremity edema over the last 2-3 weeks.  Per Dr. [name] last note, he went into atrial fibrillation persistently in August.  It is possible that this exacerbation was triggered by loss of sinus rhythm.  On exam, he is quite volume overloaded.  Creatinine up to 2.09 from 1.82.  - Hopefully renal  function will improve as we lower renal venous pressure with diuresis.  Agree with Lasix 120 mg IV bid as was started by hospitalist.  Follow renal function closely.  - Continue current Coreg 3.125 mg bid.  - Hold off on ACEI/ARB/ARNI for now with elevated creatinine.  - Restoration of NSR may help considerably in the long-term.  2. CAD: Ischemic cardiomyopathy.  No chest pain.  Mild troponin elevation is likely due to demand ischemia from volume overload.   - Reasonable to cycle troponin. - Continue statin - No ASA given stable CAD and apixaban use.  3. Atrial fibrillation: Persistent since August.  He has not missed any Eliquis (on reduced dose with age and elevated creatinine).  Possible that atrial fibrillation triggered his decompensation.  - Continue amiodarone and apixaban.  - Would plan DCCV when he is more diuresed, likely some time next week.  4. AKI on CKD stage 3: Follow closely with diuresis.  5. Deconditioning: Has been wheelchair-bound since he fractured his hip  in the spring.  6. ID: ?PNA.  Empiric abx.   [name] 11/06/2016 4:10 PM

## 2016-11-07 ENCOUNTER — Inpatient Hospital Stay (HOSPITAL_COMMUNITY): Payer: Medicare Other

## 2016-11-07 ENCOUNTER — Encounter (HOSPITAL_COMMUNITY): Payer: Self-pay

## 2016-11-07 DIAGNOSIS — I340 Nonrheumatic mitral (valve) insufficiency: Secondary | ICD-10-CM

## 2016-11-07 DIAGNOSIS — I482 Chronic atrial fibrillation: Secondary | ICD-10-CM

## 2016-11-07 DIAGNOSIS — I272 Pulmonary hypertension, unspecified: Secondary | ICD-10-CM

## 2016-11-07 DIAGNOSIS — I2510 Atherosclerotic heart disease of native coronary artery without angina pectoris: Secondary | ICD-10-CM

## 2016-11-07 DIAGNOSIS — N183 Chronic kidney disease, stage 3 (moderate): Secondary | ICD-10-CM

## 2016-11-07 DIAGNOSIS — I714 Abdominal aortic aneurysm, without rupture: Secondary | ICD-10-CM

## 2016-11-07 DIAGNOSIS — I5022 Chronic systolic (congestive) heart failure: Secondary | ICD-10-CM

## 2016-11-07 DIAGNOSIS — I481 Persistent atrial fibrillation: Secondary | ICD-10-CM

## 2016-11-07 LAB — CBC
HCT: 36.3 % — ABNORMAL LOW (ref 39.0–52.0)
Hemoglobin: 11.6 g/dL — ABNORMAL LOW (ref 13.0–17.0)
MCH: 32 pg (ref 26.0–34.0)
MCHC: 32 g/dL (ref 30.0–36.0)
MCV: 100.3 fL — AB (ref 78.0–100.0)
Platelets: 148 10*3/uL — ABNORMAL LOW (ref 150–400)
RBC: 3.62 MIL/uL — ABNORMAL LOW (ref 4.22–5.81)
RDW: 18.8 % — AB (ref 11.5–15.5)
WBC: 6 10*3/uL (ref 4.0–10.5)

## 2016-11-07 LAB — ECHOCARDIOGRAM COMPLETE
CHL CUP TV REG PEAK VELOCITY: 338 cm/s
E decel time: 208 msec
FS: 18 % — AB (ref 28–44)
HEIGHTINCHES: 70 in
IV/PV OW: 1.16
LA diam index: 2.59 cm/m2
LA vol index: 38.2 mL/m2
LASIZE: 55 mm
LAVOL: 81.3 mL
LAVOLA4C: 69.7 mL
LDCA: 3.46 cm2
LEFT ATRIUM END SYS DIAM: 55 mm
LV PW d: 11.5 mm — AB (ref 0.6–1.1)
LVOT diameter: 21 mm
MV Dec: 208
MV pk A vel: 41.1 m/s
MV pk E vel: 69 m/s
P 1/2 time: 210 ms
RV sys press: 61 mmHg
TAPSE: 15.9 mm
TRMAXVEL: 338 cm/s
WEIGHTICAEL: 3174.62 [oz_av]

## 2016-11-07 LAB — MRSA PCR SCREENING: MRSA by PCR: NEGATIVE

## 2016-11-07 LAB — MAGNESIUM: MAGNESIUM: 1.9 mg/dL (ref 1.7–2.4)

## 2016-11-07 LAB — LACTIC ACID, PLASMA: Lactic Acid, Venous: 1.1 mmol/L (ref 0.5–1.9)

## 2016-11-07 LAB — BASIC METABOLIC PANEL
Anion gap: 10 (ref 5–15)
BUN: 32 mg/dL — AB (ref 6–20)
CALCIUM: 8.6 mg/dL — AB (ref 8.9–10.3)
CHLORIDE: 102 mmol/L (ref 101–111)
CO2: 25 mmol/L (ref 22–32)
CREATININE: 1.87 mg/dL — AB (ref 0.61–1.24)
GFR calc non Af Amer: 30 mL/min — ABNORMAL LOW (ref >60)
GFR, EST AFRICAN AMERICAN: 35 mL/min — AB (ref >60)
GLUCOSE: 98 mg/dL (ref 65–99)
Potassium: 3.6 mmol/L (ref 3.5–5.1)
Sodium: 137 mmol/L (ref 135–145)

## 2016-11-07 LAB — TROPONIN I: Troponin I: <0.03 ng/mL (ref <0.03)

## 2016-11-07 MED ORDER — ORAL CARE MOUTH RINSE
15.0000 mL | Freq: Two times a day (BID) | OROMUCOSAL | Status: DC
  Administered 2016-11-07 – 2016-11-20 (×20): 15 mL via OROMUCOSAL

## 2016-11-07 MED ORDER — ENSURE ENLIVE PO LIQD
237.0000 mL | Freq: Two times a day (BID) | ORAL | Status: DC
  Administered 2016-11-08 – 2016-11-11 (×5): 237 mL via ORAL

## 2016-11-07 MED ORDER — INFLUENZA VAC SPLIT HIGH-DOSE 0.5 ML IM SUSY
0.5000 mL | PREFILLED_SYRINGE | INTRAMUSCULAR | Status: AC
  Administered 2016-11-08: 0.5 mL via INTRAMUSCULAR
  Filled 2016-11-07 (×3): qty 0.5

## 2016-11-07 NOTE — Progress Notes (Signed)
Initial Nutrition Assessment  INTERVENTION:   Ensure Enlive po BID, each supplement provides 350 kcal and 20 grams of protein  Reviewed low sodium guidelines  NUTRITION DIAGNOSIS:   Increased nutrient needs related to wound healing as evidenced by estimated needs.  GOAL:   Patient will meet greater than or equal to 90% of their needs  MONITOR:   PO intake, Supplement acceptance  REASON FOR ASSESSMENT:   Consult  (New onset heart failure)  ASSESSMENT:   Connor Diaz with PMH of AAA, BPH, CKD Stage III, HTN, hypothyroidism, A fib since 09/2016 , R hip fracture, symptomatic bradycardia St jude dual chamber PPM , and chronic systolic heart failure admitted with worsening SOB, volume overload, severe deconditioning with acute CHF and possible PNA.    Connor Diaz's weight has increased from 177 lb to 199 lb despite poor appetite.  Connor Diaz has been wheelchair bound since hip fx March 2018.  Connor Diaz and family provide hx. They report that Connor Diaz has appeared to lose weight in his face but has been gaining fluid weight. Connor Diaz eats 3 meals per day, Breakfast is cereal, Lunch usually out and a light dinner soup or cereal. Connor Diaz feels he follows a low sodium diet at home but does not realize amount of salt in restaurants or in processed foods.  Reviewed low sodium guidelines, how to read labels, high sodium foods to look out for.  Connor Diaz's breathing has progressively gotten worse over the last 2 months and intake has decreased during that time.  Connor Diaz has put out almost 2 L today and is feeling much better.  Connor Diaz would like to walk again now that he is breathing better. Discussed importance of protein for muscle.   Medications reviewed and include: synthroid, MVI, Lovaza, 120 mg IV lasix BID Labs reviewed: BNP 940 (H) Nutrition-Focused physical exam completed. Findings are no fat depletion, mild/moderate muscle depletion, and mild edema.     Diet Order:  Diet 2 gram sodium Room service appropriate? Yes; Fluid consistency:  Thin  Skin:   (stage II sacrum)  Last BM:  unknown  Height:   Ht Readings from Last 1 Encounters:  11/07/16 5\' 10"  (1.778 m)    Weight:   Wt Readings from Last 1 Encounters:  11/07/16 198 lb 6.6 oz (90 kg)    Ideal Body Weight:  75.4 kg  BMI:  Body mass index is 28.47 kg/m.  Estimated Nutritional Needs:   Kcal:  1700-1900  Protein:  100-120 grams  Fluid:  >1.5 L/day  EDUCATION NEEDS:   Education needs addressed  Kendell Bane RD, LDN, CNSC [phone] Pager [phone] After Hours Pager

## 2016-11-07 NOTE — Progress Notes (Signed)
  Echocardiogram 2D Echocardiogram has been performed.  Connor Diaz 11/07/2016, 9:38 AM

## 2016-11-07 NOTE — Progress Notes (Signed)
Progress Note  Patient Name: Connor Diaz Date of Encounter: 11/07/2016  Primary Cardiologist: Dr. Johney Frame  Subjective   Feeling much better today.  Breathing is improving  Inpatient Medications    Scheduled Meds: . amiodarone  100 mg Oral Daily  . apixaban  2.5 mg Oral BID  . atorvastatin  40 mg Oral q1800  . carvedilol  3.125 mg Oral BID WC  . famotidine  20 mg Oral QAC breakfast  . [START ON 11/08/2016] Influenza vac split quadrivalent PF  0.5 mL Intramuscular Tomorrow-1000  . ipratropium-albuterol  3 mL Nebulization TID  . levothyroxine  75 mcg Oral QAC breakfast  . mouth rinse  15 mL Mouth Rinse BID  . multivitamin with minerals  1 tablet Oral Daily  . omega-3 acid ethyl esters  1 g Oral Daily  . sodium chloride flush  3 mL Intravenous Q12H  . tamsulosin  0.4 mg Oral Daily   Continuous Infusions: . sodium chloride 250 mL (11/07/16 0329)  . ceFEPime (MAXIPIME) IV    . furosemide Stopped (11/07/16 0923)  . vancomycin     PRN Meds: sodium chloride, acetaminophen, nitroGLYCERIN, ondansetron (ZOFRAN) IV, sodium chloride flush   Vital Signs    Vitals:   11/07/16 0600 11/07/16 0730 11/07/16 0800 11/07/16 0818  BP:  126/85    Pulse: 93 (!) 112 (!) 45   Resp: 15 (!) 25 (!) 25   Temp:  98.1 F (36.7 C)    TempSrc:  Oral    SpO2: 96% (!) 84% 96% 97%  Weight: 90 kg (198 lb 6.6 oz)     Height:  (1.778 m)       Intake/Output Summary (Last 24 hours) at 11/07/16 1050 Last data filed at 11/07/16 1045  Gross per 24 hour  Intake          1415.16 ml  Output             3875 ml  Net         -2459.84 ml   Filed Weights   11/06/16 1931 11/07/16 0414 11/07/16 0600  Weight: 91 kg (200 lb 9.9 oz) 91 kg (200 lb 9.9 oz) 90 kg (198 lb 6.6 oz)    Telemetry    Atrial fibrillation.  PVCs. Rate <10 bpm. - Personally Reviewed  ECG    n/a - Personally Reviewed  Physical Exam   GEN: Well-appearing.  No acute distress.   Neck: JVP 2cm above clavicle at 45  degres.  Cardiac: RRR, no murmurs, rubs, or gallops.  Respiratory: Diffuse expiratory wheezing GI: Soft, nontender, non-distended  MS: + edema; No deformity. Neuro:  Nonfocal  Psych: Normal affect   Labs    Chemistry Recent Labs Lab 11/06/16 1106 11/07/16 0311  NA 137 137  K 3.7 3.6  CL 104 102  CO2 20* 25  GLUCOSE 94 98  BUN 34* 32*  CREATININE 2.09* 1.87*  CALCIUM 9.2 8.6*  GFRNONAA 26* 30*  GFRAA 30* 35*  ANIONGAP 13 10     Hematology Recent Labs Lab 11/06/16 1106 11/07/16 0952  WBC 6.3 6.0  RBC 3.72* 3.62*  HGB 12.3* 11.6*  HCT 37.2* 36.3*  MCV 100.0 100.3*  MCH 33.1 32.0  MCHC 33.1 32.0  RDW 19.2* 18.8*  PLT 162 148*    Cardiac Enzymes Recent Labs Lab 11/06/16 1106 11/06/16 1823 11/06/16 2202 11/07/16 0311  TROPONINI 0.03* <0.03 <0.03 <0.03    Recent Labs Lab 11/06/16 1113  TROPIPOC 0.03  BNP Recent Labs Lab 11/06/16 1106  BNP 940.4*     DDimer No results for input(s): DDIMER in the last 168 hours.   Radiology    Dg Chest Portable 1 View  Result Date: 11/06/2016 CLINICAL DATA:  Shortness of breath. EXAM: PORTABLE CHEST 1 VIEW COMPARISON:  Jul 07, 2016 FINDINGS: Study is limited due to rotated lordotic positioning. Cardiomegaly is noted. The hila and mediastinum are unchanged. Mild opacity in the left base is slightly increased in the interval. Opacity in the right base has increased. Mild interstitial prominence on the right. No other acute abnormalities. IMPRESSION: 1. Increasing bibasilar opacities, right greater than left. Developing pneumonia not excluded in the right base. Probable atelectasis in the left base. Electronically Signed   By: Gerome Sam III M.D   On: 11/06/2016 12:10    Cardiac Studies   ECHO 03/2016 EF 25-30% RV mildly reduced  Patient Profile     83 y.o. male with chronic systolic and diastolic heart failure, AAA, CKD III, hypertension, hypothyroidism, persistent AF,and bradycardia s/p PPM here with acute  on chronic heart failure.  Assessment & Plan    # Acute on chronic systolic and diastolic heart failure: LVEF 23-30% 03/2016.  RV function mildly reduced at that time. Echo pending this admission.  He was net negative 1.3L yesterday.  Currently diuresing well.  Renal function improving.   Continue carvedilol.  # Persistent atrial fibrillation:  He remains in atrial fibrillation.  Rates <100 bpm.  Continue amiodarone, carvedilol and Eliquis.  Will plan for DCCV this admission once more euvolemic.  No missed doses of Eliquis. Need to clarify how long he has been on it.   Fo r questions or updates, please contact [facility] Please consult [email or web address] for contact info under Cardiology/STEMI.      Signed, Chilton Si, MD  11/07/2016, 10:50 AM

## 2016-11-07 NOTE — Progress Notes (Signed)
PROGRESS NOTE    Connor Diaz  ZOX:[id number] DOB: Oct 13, 1923 DOA: 11/06/2016 PCP: Oval Linsey, MD   Brief Narrative:  83 y.o. WM PMHx Nonischemic Cardiomyopathy (S/P pacemaker placement last checked 09/2016), CHRONIC Systolic CHF (EF of 25-30%), Atrial Fibrillation, CAD, AAA, CKD stage III, HTN,  March 2018 sustained a hip fracture. Since then patient and family note that he has been "declining"with multiple intervening acute medical problems. Patient is presently wheelchair bound due to fatigue and debilitation. He notes that he has been having a slow progressive decrease in his exercise tolerance over the past several months which has increased over the past week. Patient notes that over the past week he has been short of breath even at rest which is unusual for him. He admits to orthopnea and PND at home. Patient has been managed by his PCP with Lasix 40 twice a day and has been told that the Lasix dose was conservative due to concerns about kidney function. Patient is also noted increasing abdominal girth and markedly lower extremity edema which has been treated with wrapping of legs.   This morning patient was markedly short of breath and gasping for air at rest. He had an outpatient M.D. appointment and was told to come to the ED from that appointment. Patient denies any acute chest or shoulder or jaw pain.   Patient denies fevers or chills. No malaise other than his acutely worsening shortness of breath. No sweats. Patient has baseline cough but that has not really changed from usual. No sputum production.   ED Course:  The patient was noted to have 5 basilar opacification right greater than left with concern for possible pneumonia. Patient was started on BiPAP with market improvement in symptomatology. He was treated with cefepime and vancomycin. He was also given Lasix 60 mg IV 1 with modest output of maybe 300 mL.    Subjective: 9/22  A/O 4, negative CP, positive acute on  chronic SOB, negative N/V, negative abdominal pain. States does not know his base weight. His wife states that he's gained ~ 20 pounds recently. Started on O2~3 months ago at home on 3  1/2 L O2 via [place]. Does not require CPAP. Wheelchair bound    Assessment & Plan:   Principal Problem:   Acute respiratory failure with hypoxia (HCC) Active Problems:   CAD (coronary artery disease)   Acute combined systolic and diastolic heart failure (HCC)   Paroxysmal atrial fibrillation (HCC)   Prostate hypertrophy   HAP (hospital-acquired pneumonia)   Acute Respiratory Failure with Hypoxia -On admission SPO2= 64% on room air, placed on BiPAP -Started on empiric antibiotics HAP?   Chronic systolic CHF  -Echocardiogram 8/11 LEFT ventricle: LVEF 25-30%.-Severe hypokinesis basal-mid inferior lateral myocardium. PA peak pressure= 39 mmHg  Recent Labs Lab 11/06/16 1106 11/06/16 1823 11/06/16 2202 11/07/16 0311  TROPONINI 0.03* <0.03 <0.03 <0.03  -Troponins negative -Amiodarone 100 mg daily -Coreg 3.125 mg BID -Lasix 120 mg BID -Strict in and out -Daily weight -Echo cardiogram pending  AAA without rupture -Increasing in size, patient family aware has refused any further intervention  Pulmonary HTN -See CHF  Chronic Atrial fibrillation(CHADSVASC score 5) -See CHF -Eliquis  HAP  -Believe his respiratory distress caused by fluid overload secondary to CHF. - chest x-ray with increasing bibasilar opacities right greater than left. -Negative fever, leukocytosis will continue antibiotics for another 24 hours however if patient is not spike fever, develop leukocytosis or otherwise declare a true infection discontinue.    CKD  stage III(Baseline Cr 1.3-1.5)   Recent Labs Lab 11/06/16 1106 11/07/16 0311  CREATININE 2.09* 1.87*  -improving but not at baseline  BPH Continue tamsulosin   HYPOTHYROIDISM Continue levothyroxin   GERD Continue Pepcid    DVT prophylaxis: Eliquis Code  Status: DO NOT RESUSCITATE Family Communication: Wife at bedside for discussion of plan care Disposition Plan: TBD   Consultants:  Cardiology    Procedures/Significant Events:    I have personally reviewed and interpreted all radiology studies and my findings are as above.  VENTILATOR SETTINGS: None   Cultures 9/22 MRSA by PCR negative 9/22 sputum pending    Antimicrobials: Anti-infectives    Start     Dose/Rate Stop   11/07/16 1400  vancomycin (VANCOCIN) IVPB 1000 mg/200 mL premix     1,000 mg 200 mL/hr over 60 Minutes     11/07/16 1330  ceFEPIme (MAXIPIME) 1 g in dextrose 5 % 50 mL IVPB     1 g 100 mL/hr over 30 Minutes     11/06/16 1300  ceFEPIme (MAXIPIME) 2 g in dextrose 5 % 50 mL IVPB     2 g 100 mL/hr over 30 Minutes 11/06/16 1403   11/06/16 1300  vancomycin (VANCOCIN) 1,750 mg in sodium chloride 0.9 % 500 mL IVPB     1,750 mg 250 mL/hr over 120 Minutes 11/06/16 1830      Devices    LINES / TUBES:      Continuous Infusions: . sodium chloride 250 mL (11/07/16 0329)  . ceFEPime (MAXIPIME) IV    . furosemide Stopped (11/06/16 2045)  . vancomycin       Objective: Vitals:   11/07/16 0414 11/07/16 0600 11/07/16 0730 11/07/16 0800  BP: (!) 135/93  126/85   Pulse: 99 93 (!) 112 (!) 45  Resp: (!) 22 15 (!) 25 (!) 25  Temp: 98.3 F (36.8 C)  98.1 F (36.7 C)   TempSrc: Oral  Oral   SpO2:  96% (!) 84% 96%  Weight: 200 lb 9.9 oz (91 kg) 198 lb 6.6 oz (90 kg)    Height:  (1.778 m)  (1.778 m)      Intake/Output Summary (Last 24 hours) at 11/07/16 0809 Last data filed at 11/07/16 0730  Gross per 24 hour  Intake          1353.16 ml  Output             3025 ml  Net         -1671.84 ml   Filed Weights   11/06/16 1931 11/07/16 0414 11/07/16 0600  Weight: 200 lb 9.9 oz (91 kg) 200 lb 9.9 oz (91 kg) 198 lb 6.6 oz (90 kg)    Examination:  General: A/O 4, positive acute on chronic respiratory distress Neck:  Negative scars, masses,  torticollis, lymphadenopathy, JVD Lungs: Clear to auscultation bilaterally, bibasilar crackles, negative wheeze Cardiovascular: Irregular irregular rhythm and rate, negative murmur gallop or rub normal S1 and S2 Abdomen: negative abdominal pain, nondistended, positive soft, bowel sounds, no rebound, no ascites, no appreciable mass Extremities: No significant cyanosis, clubbing. Bilateral pulmonary edema 2+ to thighs. Skin: Negative rashes, lesions. Bilateral lower extremity venous stasis ulcer covered in Ace wrap did not take down. Psychiatric:  Negative depression, negative anxiety, negative fatigue, negative mania  Central nervous system:  Cranial nerves II through XII intact, tongue/uvula midline, all extremities muscle strength 5/5, sensation intact throughout,  negative dysarthria, negative expressive aphasia, negative receptive aphasia.  Marland Kitchen  Data Reviewed: Care during the described time interval was provided by me .  I have reviewed this patient's available data, including medical history, events of note, physical examination, and all test results as part of my evaluation.   CBC:  Recent Labs Lab 11/06/16 1106  WBC 6.3  HGB 12.3*  HCT 37.2*  MCV 100.0  PLT 162   Basic Metabolic Panel:  Recent Labs Lab 11/06/16 1106 11/07/16 0311  NA 137 137  K 3.7 3.6  CL 104 102  CO2 20* 25  GLUCOSE 94 98  BUN 34* 32*  CREATININE 2.09* 1.87*  CALCIUM 9.2 8.6*   GFR: Estimated Creatinine Clearance: 29 mL/min (A) (by C-G formula based on SCr of 1.87 mg/dL (H)). Liver Function Tests: No results for input(s): AST, ALT, ALKPHOS, BILITOT, PROT, ALBUMIN in the last 168 hours. No results for input(s): LIPASE, AMYLASE in the last 168 hours. No results for input(s): AMMONIA in the last 168 hours. Coagulation Profile: No results for input(s): INR, PROTIME in the last 168 hours. Cardiac Enzymes:  Recent Labs Lab 11/06/16 1106 11/06/16 1823 11/06/16 2202 11/07/16 0311  TROPONINI  0.03* <0.03 <0.03 <0.03   BNP (last 3 results) No results for input(s): PROBNP in the last 8760 hours. HbA1C: No results for input(s): HGBA1C in the last 72 hours. CBG: No results for input(s): GLUCAP in the last 168 hours. Lipid Profile: No results for input(s): CHOL, HDL, LDLCALC, TRIG, CHOLHDL, LDLDIRECT in the last 72 hours. Thyroid Function Tests: No results for input(s): TSH, T4TOTAL, FREET4, T3FREE, THYROIDAB in the last 72 hours. Anemia Panel: No results for input(s): VITAMINB12, FOLATE, FERRITIN, TIBC, IRON, RETICCTPCT in the last 72 hours. Urine analysis:    Component Value Date/Time   COLORURINE RED (A) 10/04/2016 1125   APPEARANCEUR TURBID (A) 10/04/2016 1125   LABSPEC  10/04/2016 1125    TEST NOT REPORTED DUE TO COLOR INTERFERENCE OF URINE PIGMENT   PHURINE  10/04/2016 1125    TEST NOT REPORTED DUE TO COLOR INTERFERENCE OF URINE PIGMENT   GLUCOSEU (A) 10/04/2016 1125    TEST NOT REPORTED DUE TO COLOR INTERFERENCE OF URINE PIGMENT   HGBUR (A) 10/04/2016 1125    TEST NOT REPORTED DUE TO COLOR INTERFERENCE OF URINE PIGMENT   BILIRUBINUR (A) 10/04/2016 1125    TEST NOT REPORTED DUE TO COLOR INTERFERENCE OF URINE PIGMENT   KETONESUR (A) 10/04/2016 1125    TEST NOT REPORTED DUE TO COLOR INTERFERENCE OF URINE PIGMENT   PROTEINUR (A) 10/04/2016 1125    TEST NOT REPORTED DUE TO COLOR INTERFERENCE OF URINE PIGMENT   NITRITE (A) 10/04/2016 1125    TEST NOT REPORTED DUE TO COLOR INTERFERENCE OF URINE PIGMENT   LEUKOCYTESUR (A) 10/04/2016 1125    TEST NOT REPORTED DUE TO COLOR INTERFERENCE OF URINE PIGMENT   Sepsis Labs: (procalcitonin:4,lacticidven:4)  ) Recent Results (from the past 240 hour(s))  MRSA PCR Screening     Status: None   Collection Time: 11/07/16  2:02 AM  Result Value Ref Range Status   MRSA by PCR NEGATIVE NEGATIVE Final    Comment:        The GeneXpert MRSA Assay (FDA approved for NASAL specimens only), is one component of a comprehensive  MRSA colonization surveillance program. It is not intended to diagnose MRSA infection nor to guide or monitor treatment for MRSA infections.          Radiology Studies: Dg Chest Portable 1 View  Result Date: 11/06/2016 CLINICAL DATA:  Shortness  of breath. EXAM: PORTABLE CHEST 1 VIEW COMPARISON:  Jul 07, 2016 FINDINGS: Study is limited due to rotated lordotic positioning. Cardiomegaly is noted. The hila and mediastinum are unchanged. Mild opacity in the left base is slightly increased in the interval. Opacity in the right base has increased. Mild interstitial prominence on the right. No other acute abnormalities. IMPRESSION: 1. Increasing bibasilar opacities, right greater than left. Developing pneumonia not excluded in the right base. Probable atelectasis in the left base. Electronically Signed   By: Gerome Sam III M.D   On: 11/06/2016 12:10        Scheduled Meds: . amiodarone  100 mg Oral Daily  . apixaban  2.5 mg Oral BID  . atorvastatin  40 mg Oral q1800  . carvedilol  3.125 mg Oral BID WC  . famotidine  20 mg Oral QAC breakfast  . [START ON 11/08/2016] Influenza vac split quadrivalent PF  0.5 mL Intramuscular Tomorrow-1000  . ipratropium-albuterol  3 mL Nebulization TID  . levothyroxine  75 mcg Oral QAC breakfast  . mouth rinse  15 mL Mouth Rinse BID  . multivitamin with minerals  1 tablet Oral Daily  . omega-3 acid ethyl esters  1 g Oral Daily  . sodium chloride flush  3 mL Intravenous Q12H  . tamsulosin  0.4 mg Oral Daily   Continuous Infusions: . sodium chloride 250 mL (11/07/16 0329)  . ceFEPime (MAXIPIME) IV    . furosemide Stopped (11/06/16 2045)  . vancomycin       LOS: 1 day    Time spent: 40 minutes    Wynelle Dreier, Roselind Messier, MD [facility] Pager [phone]   If 7PM-7AM, please contact night-coverage [email or web address] Password [facility] 11/07/2016, 8:09 AM

## 2016-11-08 DIAGNOSIS — N400 Enlarged prostate without lower urinary tract symptoms: Secondary | ICD-10-CM

## 2016-11-08 DIAGNOSIS — I5043 Acute on chronic combined systolic (congestive) and diastolic (congestive) heart failure: Secondary | ICD-10-CM

## 2016-11-08 LAB — BASIC METABOLIC PANEL
Anion gap: 7 (ref 5–15)
BUN: 34 mg/dL — ABNORMAL HIGH (ref 6–20)
CALCIUM: 8.6 mg/dL — AB (ref 8.9–10.3)
CHLORIDE: 99 mmol/L — AB (ref 101–111)
CO2: 31 mmol/L (ref 22–32)
Creatinine, Ser: 1.99 mg/dL — ABNORMAL HIGH (ref 0.61–1.24)
GFR calc Af Amer: 32 mL/min — ABNORMAL LOW (ref >60)
GFR calc non Af Amer: 28 mL/min — ABNORMAL LOW (ref >60)
Glucose, Bld: 95 mg/dL (ref 65–99)
POTASSIUM: 3.8 mmol/L (ref 3.5–5.1)
SODIUM: 137 mmol/L (ref 135–145)

## 2016-11-08 MED ORDER — IPRATROPIUM-ALBUTEROL 0.5-2.5 (3) MG/3ML IN SOLN
3.0000 mL | Freq: Four times a day (QID) | RESPIRATORY_TRACT | Status: DC
  Administered 2016-11-09 (×3): 3 mL via RESPIRATORY_TRACT
  Filled 2016-11-08 (×3): qty 3

## 2016-11-08 MED ORDER — ALBUTEROL SULFATE (2.5 MG/3ML) 0.083% IN NEBU
2.5000 mg | INHALATION_SOLUTION | RESPIRATORY_TRACT | Status: DC | PRN
  Administered 2016-11-14: 2.5 mg via RESPIRATORY_TRACT
  Filled 2016-11-08 (×2): qty 3

## 2016-11-08 MED ORDER — IPRATROPIUM-ALBUTEROL 0.5-2.5 (3) MG/3ML IN SOLN
RESPIRATORY_TRACT | Status: AC
  Filled 2016-11-08: qty 3

## 2016-11-08 MED ORDER — IPRATROPIUM-ALBUTEROL 0.5-2.5 (3) MG/3ML IN SOLN
3.0000 mL | RESPIRATORY_TRACT | Status: DC
  Administered 2016-11-08 (×3): 3 mL via RESPIRATORY_TRACT
  Filled 2016-11-08 (×2): qty 3

## 2016-11-08 NOTE — Care Management Note (Signed)
Case Management Note Connor Pierini RN, BSN Unit 4E-Case Manager-- 2C-weekend coverage [phone]  Patient Details  Name: Connor Diaz MRN: [id number] Date of Birth: December 09, 1923  Subjective/Objective:  Pt admitted with Acute Resp. Failure- secondary to pulm. Edema and HF, ?HAP               Action/Plan: PTA pt lived at home with wife- has been using w/c lately due to fatigue- CM will follow for d/c needs. May benefit from PT eval   Expected Discharge Date:                  Expected Discharge Plan:     In-House Referral:     Discharge planning Services  CM Consult  Post Acute Care Choice:    Choice offered to:     DME Arranged:    DME Agency:     HH Arranged:    HH Agency:     Status of Service:  In process, will continue to follow  If discussed at Long Length of Stay Meetings, dates discussed:    Discharge Disposition:   Additional Comments:  Darrold Span, RN 11/08/2016, 9:55 AM

## 2016-11-08 NOTE — Progress Notes (Signed)
PROGRESS NOTE    Connor Diaz  WUJ:[id number] DOB: 1923/03/25 DOA: 11/06/2016 PCP: Oval Linsey, MD   Brief Narrative:  83 y.o. WM PMHx Nonischemic Cardiomyopathy (S/P pacemaker placement last checked 09/2016), CHRONIC Systolic CHF (EF of 25-30%), Atrial Fibrillation, CAD, AAA, CKD stage III, HTN,  March 2018 sustained a hip fracture. Since then patient and family note that he has been "declining"with multiple intervening acute medical problems. Patient is presently wheelchair bound due to fatigue and debilitation. He notes that he has been having a slow progressive decrease in his exercise tolerance over the past several months which has increased over the past week. Patient notes that over the past week he has been short of breath even at rest which is unusual for him. He admits to orthopnea and PND at home. Patient has been managed by his PCP with Lasix 40 twice a day and has been told that the Lasix dose was conservative due to concerns about kidney function. Patient is also noted increasing abdominal girth and markedly lower extremity edema which has been treated with wrapping of legs.   This morning patient was markedly short of breath and gasping for air at rest. He had an outpatient M.D. appointment and was told to come to the ED from that appointment. Patient denies any acute chest or shoulder or jaw pain.   Patient denies fevers or chills. No malaise other than his acutely worsening shortness of breath. No sweats. Patient has baseline cough but that has not really changed from usual. No sputum production.   ED Course:  The patient was noted to have 5 basilar opacification right greater than left with concern for possible pneumonia. Patient was started on BiPAP with market improvement in symptomatology. He was treated with cefepime and vancomycin. He was also given Lasix 60 mg IV 1 with modest output of maybe 300 mL.    Subjective: 9/23 A/O 4, negative CP, positive acute on  chronic SOB (significantly improved from yesterday), negative N/V, negative abdominal pain.   Assessment & Plan:   Principal Problem:   Acute respiratory failure with hypoxia (HCC) Active Problems:   CAD (coronary artery disease)   Acute combined systolic and diastolic heart failure (HCC)   Paroxysmal atrial fibrillation (HCC)   Prostate hypertrophy   HAP (hospital-acquired pneumonia)   Acute Respiratory Failure with Hypoxia -On admission SPO2= 64% on room air, placed on BiPAP -Started on empiric antibiotics HAP? Now discontinued   Chronic systolic CHF  -Echocardiogram 9/56 LEFT ventricle: LVEF 25-30%.-Severe hypokinesis basal-mid inferior lateral myocardium. PA peak pressure = 39 mmHg    Recent Labs Lab 11/06/16 1106 11/06/16 1823 11/06/16 2202 11/07/16 0311  TROPONINI 0.03* <0.03 <0.03 <0.03  -Troponins negative -Amiodarone 100 mg daily -Coreg 3.125 mg BID -Lasix 120 mg BID -Strict in and out since admission -5.1 L -Daily weight Filed Weights   11/07/16 0414 11/07/16 0600 11/08/16 0436  Weight: 200 lb 9.9 oz (91 kg) 198 lb 6.6 oz (90 kg) 193 lb (87.5 kg)  -Echocardiogram: EF unchanged significantly increased pulmonary HTN see results below   AAA without rupture -Increasing in size patient family aware has refused any further intervention  Pulmonary HTN -See CHF  Chronic Atrial fibrillation(CHADSVASC score 5) -See CHF -Eliquis  HAP  -Bili respiratory distress secondary to fluid overload, secondary to CHF . - chest x-Diaz with increasing bibasilar opacities right greater than left. -Overnight negative fever, negative leukocytosis DC antibiotic     CKD stage III(Baseline Cr 1.3-1.5)  Recent Labs Lab  11/06/16 1106 11/07/16 0311 11/08/16 0342  CREATININE 2.09* 1.87* 1.99*  -Improving with diuresis but not at baseline  BPH -  Flomax 0.4 mg daily  HYPOTHYROIDISM -Synthroid 75 g daily   GERD Continue Pepcid    DVT prophylaxis: Eliquis Code Status:  DO NOT RESUSCITATE Family Communication: None Disposition Plan: TBD   Consultants:  Cardiology    Procedures/Significant Events:  -9/22 Echocardiogram: LVEF= 25% to 30%.- Diffuse hypokinesis worse in the inferolateral and anterolateral myocardium. -Left atrium: The appendage was moderately dilated. -Pulmonary arteries: PA peak pressure: 61 mm Hg (S).     I have personally reviewed and interpreted all radiology studies and my findings are as above.  VENTILATOR SETTINGS: None   Cultures 9/22 MRSA by PCR negative 9/22 sputum pending    Antimicrobials: Anti-infectives    Start     Dose/Rate Stop   11/07/16 1400  vancomycin (VANCOCIN) IVPB 1000 mg/200 mL premix     1,000 mg 200 mL/hr over 60 Minutes     11/07/16 1330  ceFEPIme (MAXIPIME) 1 g in dextrose 5 % 50 mL IVPB     1 g 100 mL/hr over 30 Minutes     11/06/16 1300  ceFEPIme (MAXIPIME) 2 g in dextrose 5 % 50 mL IVPB     2 g 100 mL/hr over 30 Minutes 11/06/16 1403   11/06/16 1300  vancomycin (VANCOCIN) 1,750 mg in sodium chloride 0.9 % 500 mL IVPB     1,750 mg 250 mL/hr over 120 Minutes 11/06/16 1830      Devices    LINES / TUBES:      Continuous Infusions: . sodium chloride 250 mL (11/07/16 1900)  . ceFEPime (MAXIPIME) IV Stopped (11/08/16 1416)  . vancomycin 1,000 mg (11/08/16 1519)     Objective: Vitals:   11/08/16 1150 11/08/16 1300 11/08/16 1528 11/08/16 1556  BP:  115/79 113/83   Pulse: 92 88 90   Resp: 15 (!) 21 (!) 28   Temp:  98.2 F (36.8 C) 97.8 F (36.6 C)   TempSrc:  Oral Oral   SpO2: 99% 97% 94% 95%  Weight:      Height:        Intake/Output Summary (Last 24 hours) at 11/08/16 1651 Last data filed at 11/08/16 1544  Gross per 24 hour  Intake           772.33 ml  Output             3250 ml  Net         -2477.67 ml   Filed Weights   11/07/16 0414 11/07/16 0600 11/08/16 0436  Weight: 200 lb 9.9 oz (91 kg) 198 lb 6.6 oz (90 kg) 193 lb (87.5 kg)    Physical  Exam:  General: A/O 4, positive acute respiratory distress (improved from 9/22) Neck:  Negative scars, masses, torticollis, lymphadenopathy, JVD Lungs: Clear to auscultation bilaterally without wheezes or crackles Cardiovascular: Irregular irregular rhythm and rate, without murmur gallop or rub normal S1 and S2 Abdomen: negative abdominal pain, nondistended, positive soft, bowel sounds, no rebound, no ascites, no appreciable mass Extremities: No significant cyanosis, clubbing. Positive bilateral pedal edema 1-2+ to thighs Skin: Bilateral lower extremity venous stasis ulcers covered and Ace wrap's.  Psychiatric:  Negative depression, negative anxiety, negative fatigue, negative mania  Central nervous system:  Cranial nerves II through XII intact, tongue/uvula midline, all extremities muscle strength 5/5, sensation intact throughout,  negative dysarthria, negative expressive aphasia, negative receptive aphasia.  Marland Kitchen  Data Reviewed: Care during the described time interval was provided by me .  I have reviewed this patient's available data, including medical history, events of note, physical examination, and all test results as part of my evaluation.   CBC:  Recent Labs Lab 11/06/16 1106 11/07/16 0952  WBC 6.3 6.0  HGB 12.3* 11.6*  HCT 37.2* 36.3*  MCV 100.0 100.3*  PLT 162 148*   Basic Metabolic Panel:  Recent Labs Lab 11/06/16 1106 11/07/16 0311 11/07/16 0952 11/08/16 0342  NA 137 137  --  137  K 3.7 3.6  --  3.8  CL 104 102  --  99*  CO2 20* 25  --  31  GLUCOSE 94 98  --  95  BUN 34* 32*  --  34*  CREATININE 2.09* 1.87*  --  1.99*  CALCIUM 9.2 8.6*  --  8.6*  MG  --   --  1.9  --    GFR: Estimated Creatinine Clearance: 25 mL/min (A) (by C-G formula based on SCr of 1.99 mg/dL (H)). Liver Function Tests: No results for input(s): AST, ALT, ALKPHOS, BILITOT, PROT, ALBUMIN in the last 168 hours. No results for input(s): LIPASE, AMYLASE in the last 168 hours. No  results for input(s): AMMONIA in the last 168 hours. Coagulation Profile: No results for input(s): INR, PROTIME in the last 168 hours. Cardiac Enzymes:  Recent Labs Lab 11/06/16 1106 11/06/16 1823 11/06/16 2202 11/07/16 0311  TROPONINI 0.03* <0.03 <0.03 <0.03   BNP (last 3 results) No results for input(s): PROBNP in the last 8760 hours. HbA1C: No results for input(s): HGBA1C in the last 72 hours. CBG: No results for input(s): GLUCAP in the last 168 hours. Lipid Profile: No results for input(s): CHOL, HDL, LDLCALC, TRIG, CHOLHDL, LDLDIRECT in the last 72 hours. Thyroid Function Tests: No results for input(s): TSH, T4TOTAL, FREET4, T3FREE, THYROIDAB in the last 72 hours. Anemia Panel: No results for input(s): VITAMINB12, FOLATE, FERRITIN, TIBC, IRON, RETICCTPCT in the last 72 hours. Urine analysis:    Component Value Date/Time   COLORURINE RED (A) 10/04/2016 1125   APPEARANCEUR TURBID (A) 10/04/2016 1125   LABSPEC  10/04/2016 1125    TEST NOT REPORTED DUE TO COLOR INTERFERENCE OF URINE PIGMENT   PHURINE  10/04/2016 1125    TEST NOT REPORTED DUE TO COLOR INTERFERENCE OF URINE PIGMENT   GLUCOSEU (A) 10/04/2016 1125    TEST NOT REPORTED DUE TO COLOR INTERFERENCE OF URINE PIGMENT   HGBUR (A) 10/04/2016 1125    TEST NOT REPORTED DUE TO COLOR INTERFERENCE OF URINE PIGMENT   BILIRUBINUR (A) 10/04/2016 1125    TEST NOT REPORTED DUE TO COLOR INTERFERENCE OF URINE PIGMENT   KETONESUR (A) 10/04/2016 1125    TEST NOT REPORTED DUE TO COLOR INTERFERENCE OF URINE PIGMENT   PROTEINUR (A) 10/04/2016 1125    TEST NOT REPORTED DUE TO COLOR INTERFERENCE OF URINE PIGMENT   NITRITE (A) 10/04/2016 1125    TEST NOT REPORTED DUE TO COLOR INTERFERENCE OF URINE PIGMENT   LEUKOCYTESUR (A) 10/04/2016 1125    TEST NOT REPORTED DUE TO COLOR INTERFERENCE OF URINE PIGMENT   Sepsis Labs: (procalcitonin:4,lacticidven:4)  ) Recent Results (from the past 240 hour(s))  MRSA PCR Screening      Status: None   Collection Time: 11/07/16  2:02 AM  Result Value Ref Range Status   MRSA by PCR NEGATIVE NEGATIVE Final    Comment:        The GeneXpert MRSA Assay (FDA approved  for NASAL specimens only), is one component of a comprehensive MRSA colonization surveillance program. It is not intended to diagnose MRSA infection nor to guide or monitor treatment for MRSA infections.          Radiology Studies: No results found.      Scheduled Meds: . amiodarone  100 mg Oral Daily  . apixaban  2.5 mg Oral BID  . atorvastatin  40 mg Oral q1800  . carvedilol  3.125 mg Oral BID WC  . famotidine  20 mg Oral QAC breakfast  . feeding supplement (ENSURE ENLIVE)  237 mL Oral BID BM  . ipratropium-albuterol  3 mL Nebulization Q4H WA  . ipratropium-albuterol      . levothyroxine  75 mcg Oral QAC breakfast  . mouth rinse  15 mL Mouth Rinse BID  . multivitamin with minerals  1 tablet Oral Daily  . omega-3 acid ethyl esters  1 g Oral Daily  . sodium chloride flush  3 mL Intravenous Q12H  . tamsulosin  0.4 mg Oral Daily   Continuous Infusions: . sodium chloride 250 mL (11/07/16 1900)  . ceFEPime (MAXIPIME) IV Stopped (11/08/16 1416)  . vancomycin 1,000 mg (11/08/16 1519)     LOS: 2 days    Time spent: 40 minutes    Meliana Canner, Roselind Messier, MD [facility] Pager [phone]   If 7PM-7AM, please contact night-coverage [email or web address] Password [facility] 11/08/2016, 4:51 PM

## 2016-11-08 NOTE — Progress Notes (Signed)
Progress Note  Patient Name: Connor Diaz Date of Encounter: 11/08/2016  Primary Cardiologist: Shirlee Latch   Subjective   83 year old with a history of AAA, BPH, CKD Stage III, DGD, HTN, hypothyroidism, A fib since 09/2016 , R hip fracture, symptomatic bradycardia St jude dual chamber PPM , and chronic systolic heart failure.  I/O are -5.1 liters so far this admission  Cr is back up to 1.99 Breathing better.     Inpatient Medications    Scheduled Meds: . amiodarone  100 mg Oral Daily  . apixaban  2.5 mg Oral BID  . atorvastatin  40 mg Oral q1800  . carvedilol  3.125 mg Oral BID WC  . famotidine  20 mg Oral QAC breakfast  . feeding supplement (ENSURE ENLIVE)  237 mL Oral BID BM  . Influenza vac split quadrivalent PF  0.5 mL Intramuscular Tomorrow-1000  . ipratropium-albuterol  3 mL Nebulization Q4H WA  . ipratropium-albuterol      . levothyroxine  75 mcg Oral QAC breakfast  . mouth rinse  15 mL Mouth Rinse BID  . multivitamin with minerals  1 tablet Oral Daily  . omega-3 acid ethyl esters  1 g Oral Daily  . sodium chloride flush  3 mL Intravenous Q12H  . tamsulosin  0.4 mg Oral Daily   Continuous Infusions: . sodium chloride 250 mL (11/07/16 1900)  . ceFEPime (MAXIPIME) IV 1 g (11/08/16 1311)  . vancomycin Stopped (11/07/16 1535)   PRN Meds: sodium chloride, acetaminophen, albuterol, nitroGLYCERIN, ondansetron (ZOFRAN) IV, sodium chloride flush   Vital Signs    Vitals:   11/08/16 1139 11/08/16 1141 11/08/16 1150 11/08/16 1300  BP:    115/79  Pulse:   92 88  Resp:   15 (!) 21  Temp:    98.2 F (36.8 C)  TempSrc:    Oral  SpO2: (!) 89% (!) 85% 99% 97%  Weight:      Height:        Intake/Output Summary (Last 24 hours) at 11/08/16 1415 Last data filed at 11/08/16 1307  Gross per 24 hour  Intake            716.5 ml  Output             3350 ml  Net          -2633.5 ml   Filed Weights   11/07/16 0414 11/07/16 0600 11/08/16 0436  Weight: 200 lb 9.9 oz (91  kg) 198 lb 6.6 oz (90 kg) 193 lb (87.5 kg)    Telemetry    Afib  - Personally Reviewed  ECG     A-fib  - Personally Reviewed  Physical Exam   GEN:   Elderly gengleman in NAD  Neck: No JVD Cardiac: Irreg. Irreg.  Respiratory: bilateral wheezing  GI: Soft, nontender, non-distended  MS: 1+ pitting edema , L > R . Neuro:  Nonfocal  Psych: Normal affect   Labs    Chemistry Recent Labs Lab 11/06/16 1106 11/07/16 0311 11/08/16 0342  NA 137 137 137  K 3.7 3.6 3.8  CL 104 102 99*  CO2 20* 25 31  GLUCOSE 94 98 95  BUN 34* 32* 34*  CREATININE 2.09* 1.87* 1.99*  CALCIUM 9.2 8.6* 8.6*  GFRNONAA 26* 30* 28*  GFRAA 30* 35* 32*  ANIONGAP 13 10 7      Hematology Recent Labs Lab 11/06/16 1106 11/07/16 0952  WBC 6.3 6.0  RBC 3.72* 3.62*  HGB 12.3* 11.6*  HCT 37.2* 36.3*  MCV 100.0 100.3*  MCH 33.1 32.0  MCHC 33.1 32.0  RDW 19.2* 18.8*  PLT 162 148*    Cardiac Enzymes Recent Labs Lab 11/06/16 1106 11/06/16 1823 11/06/16 2202 11/07/16 0311  TROPONINI 0.03* <0.03 <0.03 <0.03    Recent Labs Lab 11/06/16 1113  TROPIPOC 0.03     BNP Recent Labs Lab 11/06/16 1106  BNP 940.4*     DDimer No results for input(s): DDIMER in the last 168 hours.   Radiology    No results found.  Cardiac Studies     Patient Profile     83 y.o. male  With acute on chronic combined CHF and atrial fib   Assessment & Plan    1.  Acute on chronic combined Systolic / diastolic CHF:   Improving . Diuresing well. Creatinine is basically stable - 1.99 today  He is wheezing - this should improve with more diuresis  2. Persistent atrial fib:   Continue Eliquis and amio.  Will plan on DCCV this admission once he is better from a volume status .    For questions or updates, please contact [facility] Please consult [email or web address] for contact info under Cardiology/STEMI.      Signed, Kristeen Miss, MD  11/08/2016, 2:15 PM

## 2016-11-09 LAB — BASIC METABOLIC PANEL
Anion gap: 7 (ref 5–15)
BUN: 39 mg/dL — AB (ref 6–20)
CHLORIDE: 98 mmol/L — AB (ref 101–111)
CO2: 30 mmol/L (ref 22–32)
Calcium: 8.5 mg/dL — ABNORMAL LOW (ref 8.9–10.3)
Creatinine, Ser: 2.1 mg/dL — ABNORMAL HIGH (ref 0.61–1.24)
GFR calc Af Amer: 30 mL/min — ABNORMAL LOW (ref >60)
GFR calc non Af Amer: 26 mL/min — ABNORMAL LOW (ref >60)
Glucose, Bld: 124 mg/dL — ABNORMAL HIGH (ref 65–99)
POTASSIUM: 3.9 mmol/L (ref 3.5–5.1)
SODIUM: 135 mmol/L (ref 135–145)

## 2016-11-09 MED ORDER — TORSEMIDE 20 MG PO TABS
40.0000 mg | ORAL_TABLET | Freq: Every day | ORAL | Status: DC
  Administered 2016-11-09 – 2016-11-10 (×2): 40 mg via ORAL
  Filled 2016-11-09 (×2): qty 2

## 2016-11-09 MED ORDER — SODIUM CHLORIDE 0.9% FLUSH: 3.0000 mL | INTRAVENOUS | Status: DC | PRN

## 2016-11-09 MED ORDER — IPRATROPIUM-ALBUTEROL 0.5-2.5 (3) MG/3ML IN SOLN
3.0000 mL | RESPIRATORY_TRACT | Status: DC
  Administered 2016-11-09 – 2016-11-11 (×6): 3 mL via RESPIRATORY_TRACT
  Filled 2016-11-09 (×7): qty 3

## 2016-11-09 MED ORDER — SODIUM CHLORIDE 0.9% FLUSH
3.0000 mL | Freq: Two times a day (BID) | INTRAVENOUS | Status: DC
  Administered 2016-11-09 – 2016-11-11 (×4): 3 mL via INTRAVENOUS

## 2016-11-09 MED ORDER — SODIUM CHLORIDE 0.9 % IV SOLN
250.0000 mL | INTRAVENOUS | Status: DC
  Administered 2016-11-09: 250 mL via INTRAVENOUS

## 2016-11-09 MED ORDER — SODIUM CHLORIDE 0.9 % IV SOLN
250.0000 mL | INTRAVENOUS | Status: DC
  Administered 2016-11-10: 10:00:00 via INTRAVENOUS

## 2016-11-09 NOTE — Progress Notes (Signed)
[place] TEAM 1 - Stepdown/ICU TEAM  Connor Diaz  OEV:[id number] DOB: [birth year] DOA: 11/06/2016 PCP: Oval Linsey, MD    Brief Narrative:  83yo M w/ Hx Nonischemic Cardiomyopathy (S/P pacemaker placement last checked 09/2016), Chronic Systolic CHF (EF of 25-30%), Atrial Fibrillation, CAD, AAA, CKD stage III, HTN, and a hip fx in March 2018 after which he has been "declining" with multiple intervening acute medical problems who presented to the hospital w/ dyspnea.   In the ED the patient was noted to have bibasilar opacification right greater than left with concern for possible pneumonia.   Subjective: The patient is sitting up comfortably in a bedside chair.  He reports his breathing is much improved today.  He denies chest pain fevers chills nausea vomiting or abdominal pain.  Assessment & Plan:  Acute hypoxic respiratory failure due to pulmonary edema Wean O2 as able w/ ongoing attempts to diurese   Acute exacerbation of Chronic systolic congestive heart failure EF 25-30% - ischemic in etiology - care per CHF Team   AAA  CAD - mild troponin elevation  Demand ischemia due to volume overload - stable at present   Chronic persistent atrial fibrillation Chadsvasc is 5 - cont eliquis and amio - for DCCV 9/25   Acute kidney injury on CKD stage III Baseline creatinine 1.3-1.5 - follow w/ ongoing attempts to diurese   Recent Labs Lab 11/06/16 1106 11/07/16 0311 11/08/16 0342 11/09/16 0229  CREATININE 2.09* 1.87* 1.99* 2.10*   BPH Cont flomax  Hypothyroidism Cont home synthroid dose   L LE w/ 3 areas of full thickness wounds Care as per WOC Team   GERD  DVT prophylaxis: eliquis Code Status: DNR - NO CODE Family Communication: spoke w/ wife at bedside   Disposition Plan: SDU  Consultants:  Cardiology   Procedures: TTE 9/22 EF 25-30% with diffuse hypokinesis   Antimicrobials:  Cefepime 9/21 > 9/23 Vancomycin 9/21 > 9/23    Objective: Blood  pressure (!) 88/74, pulse 82, temperature (!) 96.4 F (35.8 C), temperature source Axillary, resp. rate 15, height 5\' 10"  (1.778 m), weight 83.5 kg (184 lb), SpO2 100 %.  Intake/Output Summary (Last 24 hours) at 11/09/16 1444 Last data filed at 11/09/16 1300  Gross per 24 hour  Intake          1207.08 ml  Output             1250 ml  Net           -42.92 ml   Filed Weights   11/07/16 0600 11/08/16 0436 11/09/16 0252  Weight: 90 kg (198 lb 6.6 oz) 87.5 kg (193 lb) 83.5 kg (184 lb)    Examination: General: No acute respiratory distress on O2 Lungs: fine diffuse crackles - no wheezing  Cardiovascular: Regular rate w/o gallup or rub  Abdomen: Nontender, nondistended, soft Extremities: trace B LE edema   CBC:  Recent Labs Lab 11/06/16 1106 11/07/16 0952  WBC 6.3 6.0  HGB 12.3* 11.6*  HCT 37.2* 36.3*  MCV 100.0 100.3*  PLT 162 148*   Basic Metabolic Panel:  Recent Labs Lab 11/06/16 1106 11/07/16 0311 11/07/16 0952 11/08/16 0342 11/09/16 0229  NA 137 137  --  137 135  K 3.7 3.6  --  3.8 3.9  CL 104 102  --  99* 98*  CO2 20* 25  --  31 30  GLUCOSE 94 98  --  95 124*  BUN 34* 32*  --  34* 39*  CREATININE 2.09* 1.87*  --  1.99* 2.10*  CALCIUM 9.2 8.6*  --  8.6* 8.5*  MG  --   --  1.9  --   --    GFR: Estimated Creatinine Clearance: 23.7 mL/min (A) (by C-G formula based on SCr of 2.1 mg/dL (H)).  Liver Function Tests: No results for input(s): AST, ALT, ALKPHOS, BILITOT, PROT, ALBUMIN in the last 168 hours. No results for input(s): LIPASE, AMYLASE in the last 168 hours. No results for input(s): AMMONIA in the last 168 hours.   Cardiac Enzymes:  Recent Labs Lab 11/06/16 1106 11/06/16 1823 11/06/16 2202 11/07/16 0311  TROPONINI 0.03* <0.03 <0.03 <0.03     Recent Results (from the past 240 hour(s))  MRSA PCR Screening     Status: None   Collection Time: 11/07/16  2:02 AM  Result Value Ref Range Status   MRSA by PCR NEGATIVE NEGATIVE Final    Comment:         The GeneXpert MRSA Assay (FDA approved for NASAL specimens only), is one component of a comprehensive MRSA colonization surveillance program. It is not intended to diagnose MRSA infection nor to guide or monitor treatment for MRSA infections.      Scheduled Meds: . amiodarone  100 mg Oral Daily  . apixaban  2.5 mg Oral BID  . atorvastatin  40 mg Oral q1800  . carvedilol  3.125 mg Oral BID WC  . famotidine  20 mg Oral QAC breakfast  . feeding supplement (ENSURE ENLIVE)  237 mL Oral BID BM  . ipratropium-albuterol  3 mL Nebulization Q4H WA  . levothyroxine  75 mcg Oral QAC breakfast  . mouth rinse  15 mL Mouth Rinse BID  . multivitamin with minerals  1 tablet Oral Daily  . omega-3 acid ethyl esters  1 g Oral Daily  . sodium chloride flush  3 mL Intravenous Q12H  . sodium chloride flush  3 mL Intravenous Q12H  . sodium chloride flush  3 mL Intravenous Q12H  . tamsulosin  0.4 mg Oral Daily  . torsemide  40 mg Oral Daily     LOS: 3 days   Lonia Blood, MD [facility] Office  [phone] Pager - Text Page per Amion as per below:  On-Call/Text Page:      [email or web address]      password [email or web address]  If 7PM-7AM, please contact night-coverage [email or web address] Password [facility] 11/09/2016, 2:44 PM

## 2016-11-09 NOTE — Consult Note (Addendum)
WOC Nurse wound consult note Reason for Consult: Consult requested for left leg.   Wound type: Left calf with 3 areas of full thickness wounds; .2X.2X.1cm, pink and dry, healing without odor or drainage;  4X2X.1cm and 2X.3X.1cm; both red and moist with small amt pink drainage, no odor. Periwound: Dark purple hemosiderin staining to skin surrounding wounds. Dressing procedure/placement/frequency: Foam dressing to protect and promote healing.  Discussed plan of care with pt and family members at the bedside. Please re-consult if further assistance is needed.  Thank-you,  Cammie Mcgee MSN, RN, CWOCN, [place], CNS [phone]

## 2016-11-09 NOTE — Progress Notes (Addendum)
Patient ID: ULESS OGREN, male   DOB: [birth year], 83 y.o.   MRN: [id number]     Advanced Heart Failure Rounding Note  Primary Cardiologist: Allred  Subjective:    Patient woke up confused this morning but can be re-oriented. Weight down significantly compared to admission. He remains in atrial fibrillation.    Breathing is better overall.   Echo (9/18): EF 25-30%, mild LVH, mildly dilated RV with normal systolic function, PASP 61 mmHg.   Objective:   Weight Range: 184 lb (83.5 kg) Body mass index is 26.4 kg/m.   Vital Signs:   Temp:  [97.3 F (36.3 C)-98.2 F (36.8 C)] 97.8 F (36.6 C) (09/24 0252) Pulse Rate:  [70-92] 70 (09/24 0807) Resp:  [15-28] 20 (09/24 0252) BP: (96-136)/(66-84) 102/69 (09/24 0807) SpO2:  [85 %-99 %] 95 % (09/24 0807) FiO2 (%):  [40 %] 40 % (09/24 0807) Weight:  [184 lb (83.5 kg)] 184 lb (83.5 kg) (09/24 0252)    Weight change: Filed Weights   11/07/16 0600 11/08/16 0436 11/09/16 0252  Weight: 198 lb 6.6 oz (90 kg) 193 lb (87.5 kg) 184 lb (83.5 kg)    Intake/Output:   Intake/Output Summary (Last 24 hours) at 11/09/16 0812 Last data filed at 11/09/16 0500  Gross per 24 hour  Intake              723 ml  Output             1700 ml  Net             -977 ml      Physical Exam    General:  Well appearing. No resp difficulty HEENT: Normal Neck: Supple. JVP 8 cm. Carotids 2+ bilat; no bruits. No lymphadenopathy or thyromegaly appreciated. Cor: PMI nondisplaced. Irregular rate & rhythm. No rubs, gallops or murmurs. Lungs: Clear Abdomen: Soft, nontender, nondistended. No hepatosplenomegaly. No bruits or masses. Good bowel sounds. Extremities: No cyanosis, clubbing, rash.  1+ ankle edema.  Ulceration left lower leg.  Neuro: Alert & orientedx3, cranial nerves grossly intact. moves all 4 extremities w/o difficulty. Affect pleasant   Telemetry   Personally reviewed, atrial fibrillation with v-pacing.    Labs    CBC  Recent Labs  11/06/16 1106 11/07/16 0952  WBC 6.3 6.0  HGB 12.3* 11.6*  HCT 37.2* 36.3*  MCV 100.0 100.3*  PLT 162 148*   Basic Metabolic Panel  Recent Labs  11/07/16 0952 11/08/16 0342 11/09/16 0229  NA  --  137 135  K  --  3.8 3.9  CL  --  99* 98*  CO2  --  31 30  GLUCOSE  --  95 124*  BUN  --  34* 39*  CREATININE  --  1.99* 2.10*  CALCIUM  --  8.6* 8.5*  MG 1.9  --   --    Liver Function Tests No results for input(s): AST, ALT, ALKPHOS, BILITOT, PROT, ALBUMIN in the last 72 hours. No results for input(s): LIPASE, AMYLASE in the last 72 hours. Cardiac Enzymes  Recent Labs  11/06/16 1823 11/06/16 2202 11/07/16 0311  TROPONINI <0.03 <0.03 <0.03    BNP: BNP (last 3 results)  Recent Labs  04/22/16 0929 05/21/16 1140 11/06/16 1106  BNP 596.7* 962.0* 940.4*    ProBNP (last 3 results) No results for input(s): PROBNP in the last 8760 hours.   D-Dimer No results for input(s): DDIMER in the last 72 hours. Hemoglobin A1C No results for input(s): HGBA1C in the  last 72 hours. Fasting Lipid Panel No results for input(s): CHOL, HDL, LDLCALC, TRIG, CHOLHDL, LDLDIRECT in the last 72 hours. Thyroid Function Tests No results for input(s): TSH, T4TOTAL, T3FREE, THYROIDAB in the last 72 hours.  Invalid input(s): FREET3  Other results:   Imaging     No results found.   Medications:     Scheduled Medications: . amiodarone  100 mg Oral Daily  . apixaban  2.5 mg Oral BID  . atorvastatin  40 mg Oral q1800  . carvedilol  3.125 mg Oral BID WC  . famotidine  20 mg Oral QAC breakfast  . feeding supplement (ENSURE ENLIVE)  237 mL Oral BID BM  . ipratropium-albuterol  3 mL Nebulization Q6H  . levothyroxine  75 mcg Oral QAC breakfast  . mouth rinse  15 mL Mouth Rinse BID  . multivitamin with minerals  1 tablet Oral Daily  . omega-3 acid ethyl esters  1 g Oral Daily  . sodium chloride flush  3 mL Intravenous Q12H  . tamsulosin  0.4 mg Oral Daily     Infusions: .  sodium chloride 250 mL (11/07/16 1900)     PRN Medications:  sodium chloride, acetaminophen, albuterol, nitroGLYCERIN, ondansetron (ZOFRAN) IV, sodium chloride flush    Patient Profile   Mr Kopke is 83 year old with chronic systolic heart failure, A fib, and limited mobility.  Admitted with hypoxic respiratory failure and marked volume overload.   Assessment/Plan   1. Acute on chronic systolic CHF: Ischemic cardiomyopathy.  Echo (9/18) with EF 25-30% with D-shaped ventricle suggestive of RV pressure/volume overload.  He has developed symptoms and lower extremity edema over the last 2-3 weeks prior to admission.  Per Dr. [name] last note, he went into atrial fibrillation persistently in August.  It is possible that this exacerbation was triggered by loss of sinus rhythm.  Initially volume overloaded on exam, weight now down with diuresis and volume status looks better.  Creatinine higher today to 2.1.  - At home, he was on Lasix 40 qam/20 qpm.  Will transition him to torsemide 40 mg daily.  - Continue current Coreg 3.125 mg bid.  - Hold off on ACEI/ARB/ARNI for now with elevated creatinine.  - Restoration of NSR may help considerably in the long-term.  2. CAD: Ischemic cardiomyopathy.  No chest pain.  Mild troponin elevation is likely due to demand ischemia from volume overload.   - Continue statin - No ASA given stable CAD and apixaban use.  3. Atrial fibrillation: Persistent since August.  He has not missed any Eliquis (on reduced dose with age and elevated creatinine).  Possible that atrial fibrillation triggered his decompensation.  - Continue amiodarone and apixaban.  - He has not missed any recent doses of apixaban, has been on apixaban for > 1 month.  Will plan DCCV tomorrow morning.  I have discussed risks/benefits with him and he agrees to proceed.  4. AKI on CKD stage 3: Creatinine initially dropped but now back up to initial value (2.1 today).   5. Deconditioning: Has  been wheelchair-bound since he fractured his hip in the spring.  6. Symptomatic bradycardia: Has St Jude dual chamber pacemaker.  With cardiomyopathy, would limit RV pacing.  7. Leg ulcers: Will have wound care evaluate.    Length of Stay: 3  [name], MD  11/09/2016, 8:12 AM  Advanced Heart Failure Team Pager [phone] (M-F; 7a - 4p)  Please contact [facility] for night-coverage after hours (4p -7a ) and weekends  on [email or web address]

## 2016-11-09 NOTE — Evaluation (Signed)
Physical Therapy Evaluation Patient Details Name: Connor Diaz MRN: [id number] DOB: 02-20-1923 Today's Date: 11/09/2016   History of Present Illness  Connor Diaz is a 83 year old with a history of AAA, BPH, CKD Stage III, DGD, HTN, hypothyroidism, A fib since 09/2016 , R hip fracture, symptomatic bradycardia St jude dual chamber PPM , and chronic systolic heart failure. Admitted with SOB and 20# weight gain.  Clinical Impression  Patient presents with decreased independence with mobility due to deficits listed in PT problem list.  He will benefit from skilled PT in the acute setting to allow return home with family support and to resume HHPT services.    Follow Up Recommendations Home health PT    Equipment Recommendations  None recommended by PT    Recommendations for Other Services       Precautions / Restrictions Precautions Precautions: Fall Precaution Comments: O2 3.5L @ home      Mobility  Bed Mobility               General bed mobility comments: up in chair  Transfers Overall transfer level: Needs assistance   Transfers: Sit to/from Stand Sit to Stand: Min guard         General transfer comment: cues for hand placement  Ambulation/Gait Ambulation/Gait assistance: Min guard;+2 safety/equipment Ambulation Distance (Feet): 15 Feet Assistive device: Rolling walker (2 wheeled) Gait Pattern/deviations: Step-to pattern;Step-through pattern;Decreased stride length     General Gait Details: limited due to pain R knee (noted genuvarus deformity bilateral)  Stairs            Wheelchair Mobility    Modified Rankin (Stroke Patients Only)       Balance Overall balance assessment: Needs assistance   Sitting balance-Leahy Scale: Good     Standing balance support: Bilateral upper extremity supported Standing balance-Leahy Scale: Poor Standing balance comment: UE support for balance                             Pertinent  Vitals/Pain Pain Assessment: No/denies pain    Home Living Family/patient expects to be discharged to:: Private residence Living Arrangements: Spouse/significant other Available Help at Discharge: Family;Available 24 hours/day Type of Home: House Home Access: Stairs to enter;Ramped entrance Entrance Stairs-Rails: None Entrance Stairs-Number of Steps: 1 Home Layout: One level Home Equipment: Walker - 2 wheels;Walker - 4 wheels;Wheelchair - [employer];Shower seat - built in;Hand held shower head;Grab bars - tub/shower      Prior Function Level of Independence: Needs assistance   Gait / Transfers Assistance Needed: independnet with stand pivot transfer to w/c.  wife follows with w/c when he walks with walker  ADL's / Homemaking Assistance Needed: independent with dressing and assistance with bathing.          Hand Dominance   Dominant Hand: Right    Extremity/Trunk Assessment   Upper Extremity Assessment Upper Extremity Assessment: Overall WFL for tasks assessed    Lower Extremity Assessment Lower Extremity Assessment: RLE deficits/detail;LLE deficits/detail RLE Deficits / Details: AROM grossly WFL, strength 3/5 hip flexion 4-/5 knee extension with pitting edema noted with testing LLE Deficits / Details: AROM WFL, strength hip flexion 3/5, knee extension 4/5       Communication   Communication: HOH  Cognition Arousal/Alertness: Awake/alert Behavior During Therapy: WFL for tasks assessed/performed Overall Cognitive Status: Within Functional Limits for tasks assessed  General Comments: admits to disorientation when he woke up due to low oxygen saturation      General Comments      Exercises     Assessment/Plan    PT Assessment Patient needs continued PT services  PT Problem List Decreased strength;Decreased mobility;Decreased balance;Decreased knowledge of use of DME;Decreased activity tolerance        PT Treatment Interventions DME instruction;Gait training;Functional mobility training;Balance training;Therapeutic exercise;Patient/family education;Therapeutic activities    PT Goals (Current goals can be found in the Care Plan section)  Acute Rehab PT Goals Patient Stated Goal: To go home PT Goal Formulation: With patient Time For Goal Achievement: 11/14/16 Potential to Achieve Goals: Good    Frequency Min 3X/week   Barriers to discharge        Co-evaluation               AM-PAC PT "6 Clicks" Daily Activity  Outcome Measure Difficulty turning over in bed (including adjusting bedclothes, sheets and blankets)?: A Little Difficulty moving from lying on back to sitting on the side of the bed? : A Little Difficulty sitting down on and standing up from a chair with arms (e.g., wheelchair, bedside commode, etc,.)?: A Little Help needed moving to and from a bed to chair (including a wheelchair)?: A Little Help needed walking in hospital room?: A Little Help needed climbing 3-5 steps with a railing? : A Little 6 Click Score: 18    End of Session Equipment Utilized During Treatment: Gait belt;Oxygen Activity Tolerance: Patient tolerated treatment well Patient left: in chair;with call bell/phone within reach   PT Visit Diagnosis: Other abnormalities of gait and mobility (R26.89);Muscle weakness (generalized) (M62.81)    Time: 7425-9563 PT Time Calculation (min) (ACUTE ONLY): 23 min   Charges:   PT Evaluation $PT Eval Moderate Complexity: 1 Mod PT Treatments $Gait Training: 8-22 mins   PT G CodesSheran Diaz, [place] [phone] 11/09/2016   Connor Diaz 11/09/2016, 1:18 PM

## 2016-11-09 NOTE — Progress Notes (Signed)
Heard pt yelling from down the hall. Walked into pt room to find pt sitting on side of bed with O2 off. Pt confused. Replaced O2. Made comfortable. Pt became more oriented. Will continue to assess.  Versie Starks, RN

## 2016-11-10 ENCOUNTER — Inpatient Hospital Stay (HOSPITAL_COMMUNITY): Payer: Medicare Other | Admitting: Certified Registered Nurse Anesthetist

## 2016-11-10 ENCOUNTER — Encounter (HOSPITAL_COMMUNITY): Payer: Self-pay | Admitting: Certified Registered Nurse Anesthetist

## 2016-11-10 ENCOUNTER — Encounter (HOSPITAL_COMMUNITY)
Admission: EM | Disposition: A | Discharge status: Nursing Home (Includes ICF) | Payer: Self-pay | Source: Home / Self Care | Attending: Internal Medicine

## 2016-11-10 DIAGNOSIS — J9621 Acute and chronic respiratory failure with hypoxia: Secondary | ICD-10-CM

## 2016-11-10 HISTORY — PX: CARDIOVERSION: SHX1299

## 2016-11-10 LAB — BASIC METABOLIC PANEL
Anion gap: 7 (ref 5–15)
BUN: 45 mg/dL — AB (ref 6–20)
CHLORIDE: 95 mmol/L — AB (ref 101–111)
CO2: 32 mmol/L (ref 22–32)
Calcium: 8.5 mg/dL — ABNORMAL LOW (ref 8.9–10.3)
Creatinine, Ser: 2.14 mg/dL — ABNORMAL HIGH (ref 0.61–1.24)
GFR calc Af Amer: 29 mL/min — ABNORMAL LOW (ref >60)
GFR calc non Af Amer: 25 mL/min — ABNORMAL LOW (ref >60)
GLUCOSE: 117 mg/dL — AB (ref 65–99)
POTASSIUM: 4 mmol/L (ref 3.5–5.1)
Sodium: 134 mmol/L — ABNORMAL LOW (ref 135–145)

## 2016-11-10 LAB — MAGNESIUM: MAGNESIUM: 2.2 mg/dL (ref 1.7–2.4)

## 2016-11-10 SURGERY — CARDIOVERSION
Anesthesia: General

## 2016-11-10 MED ORDER — ETOMIDATE 2 MG/ML IV SOLN
INTRAVENOUS | Status: DC | PRN
  Administered 2016-11-10: 14 mg via INTRAVENOUS

## 2016-11-10 NOTE — Procedures (Signed)
Electrical Cardioversion Procedure Note Connor Diaz [id number] 06/04/1923  Procedure: Electrical Cardioversion Indications:  Atrial Fibrillation  Procedure Details Consent: Risks of procedure as well as the alternatives and risks of each were explained to the (patient/caregiver).  Consent for procedure obtained. Time Out: Verified patient identification, verified procedure, site/side was marked, verified correct patient position, special equipment/implants available, medications/allergies/relevent history reviewed, required imaging and test results available.  Performed  Patient placed on cardiac monitor, pulse oximetry, supplemental oxygen as necessary.  Sedation given: Propofol per anesthesiology Pacer pads placed anterior and posterior chest.  Cardioverted 3 time(s), 2nd two with sternal pressure.  Cardioverted at 200J.  Evaluation Findings: Post procedure EKG shows: Atrial Fibrillation Complications: None Patient did tolerate procedure well.  We were unable to get him out of atrial fibrillation. Will stop amiodarone.    Connor Diaz 11/10/2016, 11:13 AM

## 2016-11-10 NOTE — Anesthesia Procedure Notes (Signed)
Date/Time: 11/10/2016 11:29 AM Performed by: Rogelia Boga Oxygen Delivery Method: Simple face mask

## 2016-11-10 NOTE — Anesthesia Procedure Notes (Signed)
Procedure Name: MAC Date/Time: 11/10/2016 11:05 AM Performed by: Carney Living Pre-anesthesia Checklist: Patient identified, Emergency Drugs available, Patient being monitored, Suction available and Timeout performed Patient Re-evaluated:Patient Re-evaluated prior to induction Oxygen Delivery Method: Ambu bag Preoxygenation: Pre-oxygenation with 100% oxygen Induction Type: IV induction Ventilation: Mask ventilation without difficulty

## 2016-11-10 NOTE — Progress Notes (Signed)
PT Cancellation Note  Patient Details Name: Connor Diaz MRN: [id number] DOB: January 14, 1924   Cancelled Treatment:    Reason Eval/Treat Not Completed: Patient at procedure or test/unavailable PT will check on pt later as time allows.    Derek Mound, PTA Pager: [phone]   11/10/2016, 10:03 AM

## 2016-11-10 NOTE — Progress Notes (Signed)
Patient ID: Connor Diaz, male   DOB: Aug 07, 1923, 83 y.o.   MRN: [id number]     Advanced Heart Failure Rounding Note  Primary Cardiologist: Allred  Subjective:    Denies SOB or CP. Slightly anxious about upcoming procedure.   Creatinine and K stable. Weight down 16 lbs from admission.   Echo (9/18): EF 25-30%, mild LVH, mildly dilated RV with normal systolic function, PASP 61 mmHg.   Objective:   Weight Range: 184 lb 1.4 oz (83.5 kg) Body mass index is 26.41 kg/m.   Vital Signs:   Temp:  [97.5 F (36.4 C)-98.5 F (36.9 C)] 98.3 F (36.8 C) (09/25 0353) Pulse Rate:  [71-186] 71 (09/25 0722) Resp:  [15-29] 23 (09/25 0722) BP: (88-123)/(57-80) 116/75 (09/24 2314) SpO2:  [93 %-100 %] 99 % (09/25 0722) Weight:  [184 lb 1.4 oz (83.5 kg)] 184 lb 1.4 oz (83.5 kg) (09/25 0353) Last BM Date: 11/09/16  Weight change: Filed Weights   11/08/16 0436 11/09/16 0252 11/10/16 0353  Weight: 193 lb (87.5 kg) 184 lb (83.5 kg) 184 lb 1.4 oz (83.5 kg)    Intake/Output:   Intake/Output Summary (Last 24 hours) at 11/10/16 0826 Last data filed at 11/10/16 0354  Gross per 24 hour  Intake           938.08 ml  Output              650 ml  Net           288.08 ml      Physical Exam    General: Elderly and chronically ill appearing. No resp difficulty. HEENT: Normal Neck: Supple. JVP 7-8 cm. Carotids 2+ bilat; no bruits. No thyromegaly or nodule noted. Cor: PMI nondisplaced. RRR, No M/G/R noted Lungs: CTAB, normal effort. Abdomen: Soft, non-tender, non-distended, no HSM. No bruits or masses. +BS  Extremities: No cyanosis, clubbing, or rash. Trace to 1+ ankle edema. Ulceration LLE.  Neuro: Alert & orientedx3, cranial nerves grossly intact. moves all 4 extremities w/o difficulty. Affect pleasant   Telemetry   Atrial fibrillation, with V-pacing, Personally reviewed.   Labs    CBC  Recent Labs  11/07/16 0952  WBC 6.0  HGB 11.6*  HCT 36.3*  MCV 100.3*  PLT 148*   Basic  Metabolic Panel  Recent Labs  11/07/16 0952  11/09/16 0229 11/10/16 0222  NA  --   < > 135 134*  K  --   < > 3.9 4.0  CL  --   < > 98* 95*  CO2  --   < > 30 32  GLUCOSE  --   < > 124* 117*  BUN  --   < > 39* 45*  CREATININE  --   < > 2.10* 2.14*  CALCIUM  --   < > 8.5* 8.5*  MG 1.9  --   --   --   < > = values in this interval not displayed. Liver Function Tests No results for input(s): AST, ALT, ALKPHOS, BILITOT, PROT, ALBUMIN in the last 72 hours. No results for input(s): LIPASE, AMYLASE in the last 72 hours. Cardiac Enzymes No results for input(s): CKTOTAL, CKMB, CKMBINDEX, TROPONINI in the last 72 hours.  BNP: BNP (last 3 results)  Recent Labs  04/22/16 0929 05/21/16 1140 11/06/16 1106  BNP 596.7* 962.0* 940.4*    ProBNP (last 3 results) No results for input(s): PROBNP in the last 8760 hours.   D-Dimer No results for input(s): DDIMER in the last 72  hours. Hemoglobin A1C No results for input(s): HGBA1C in the last 72 hours. Fasting Lipid Panel No results for input(s): CHOL, HDL, LDLCALC, TRIG, CHOLHDL, LDLDIRECT in the last 72 hours. Thyroid Function Tests No results for input(s): TSH, T4TOTAL, T3FREE, THYROIDAB in the last 72 hours.  Invalid input(s): FREET3  Other results:   Imaging   No results found.  Medications:     Scheduled Medications: . amiodarone  100 mg Oral Daily  . apixaban  2.5 mg Oral BID  . atorvastatin  40 mg Oral q1800  . carvedilol  3.125 mg Oral BID WC  . famotidine  20 mg Oral QAC breakfast  . feeding supplement (ENSURE ENLIVE)  237 mL Oral BID BM  . ipratropium-albuterol  3 mL Nebulization Q4H WA  . levothyroxine  75 mcg Oral QAC breakfast  . mouth rinse  15 mL Mouth Rinse BID  . multivitamin with minerals  1 tablet Oral Daily  . omega-3 acid ethyl esters  1 g Oral Daily  . sodium chloride flush  3 mL Intravenous Q12H  . sodium chloride flush  3 mL Intravenous Q12H  . sodium chloride flush  3 mL Intravenous Q12H  .  tamsulosin  0.4 mg Oral Daily  . torsemide  40 mg Oral Daily    Infusions: . sodium chloride 250 mL (11/09/16 2300)  . sodium chloride 250 mL (11/09/16 0955)  . sodium chloride      PRN Medications: sodium chloride, acetaminophen, albuterol, nitroGLYCERIN, ondansetron (ZOFRAN) IV, sodium chloride flush, sodium chloride flush, sodium chloride flush    Patient Profile   [name] is 83 year old with chronic systolic heart failure, A fib, and limited mobility.  Admitted with hypoxic respiratory failure and marked volume overload.   Assessment/Plan   1. Acute on chronic systolic CHF: Ischemic cardiomyopathy.  Echo (9/18) with EF 25-30% with D-shaped ventricle suggestive of RV pressure/volume overload.  He has developed symptoms and lower extremity edema over the last 2-3 weeks prior to admission.  Per Dr. Jenel Diaz last note, he went into atrial fibrillation persistently in August.  It is possible that this exacerbation was triggered by loss of sinus rhythm.  Initially volume overloaded on exam, weight now down with diuresis and volume status looks better.  Creatinine high at 2.1 but stable.  - Continue torsemide 40 mg daily.  - Continue current Coreg 3.125 mg bid.  - Hold off on ACEI/ARB/ARNI for now with elevated creatinine.  - Restoration of NSR may help considerably in the long-term.  2. CAD: Ischemic cardiomyopathy. - No s/s of ischemia. Mild troponin elevation is likely due to demand ischemia from volume overload.   - Continue statin - No ASA given stable CAD and apixaban use.  3. Atrial fibrillation: Persistent since August.  He has not missed any Eliquis (on reduced dose with age and elevated creatinine).  Possible that atrial fibrillation triggered his decompensation.  - Continue amiodarone and apixaban.  - He has not missed any recent doses of apixaban, has been on apixaban for > 1 month.   - Plan for DCCV this am. We have discussed risks/benefits with him and he agrees to  proceed.  4. AKI on CKD stage 3: - Initially dropped but now elevated, but stable around 2.1.  5. Deconditioning: Has been wheelchair-bound since he fractured his hip in the spring. PT following.  6. Symptomatic bradycardia: Has St Jude dual chamber pacemaker.  With cardiomyopathy, would limit RV pacing.  7. Leg ulcers:  - WOC following.  Length of Stay: [address]  [employer]  11/10/2016, 8:26 AM  Advanced Heart Failure Team Pager [phone] (M-F; 7a - 4p)  Please contact [facility] for night-coverage after hours (4p -7a ) and weekends on [email or web address]  Patient seen with PA, agree with the above note.    Volume status improved, will continue torsemide 40 mg daily.  Creatinine stable. Weight is down.   DCCV today failed, he remained in atrial fibrillation.  Rate is controlled.  He has been in atrial fibrillation since 8/17.  Will stop amiodarone as we cannot convert him to NSR.  Increase Coreg to 6.25 mg bid.   Limit RV pacing with cardiomyopathy, will decrease backup rate from 70 to 60 bpm.   If he remains stable, should be able to go home tomorrow.   [name] 11/10/2016 11:17 AM

## 2016-11-10 NOTE — Anesthesia Preprocedure Evaluation (Signed)
Anesthesia Evaluation  Patient identified by MRN, date of birth, ID band Patient awake    Reviewed: Allergy & Precautions, NPO status , Patient's Chart, lab work & pertinent test results  Airway Mallampati: II  TM Distance: >3 FB Neck ROM: Full    Dental  (+) Edentulous Upper, Partial Lower   Pulmonary former smoker,     + decreased breath sounds      Cardiovascular hypertension,  Rhythm:Irregular Rate:Normal     Neuro/Psych    GI/Hepatic   Endo/Other    Renal/GU      Musculoskeletal   Abdominal   Peds  Hematology   Anesthesia Other Findings   Reproductive/Obstetrics                             Anesthesia Physical Anesthesia Plan  ASA: III  Anesthesia Plan: General   Post-op Pain Management:    Induction: Intravenous  PONV Risk Score and Plan:   Airway Management Planned: Mask  Additional Equipment:   Intra-op Plan:   Post-operative Plan:   Informed Consent: I have reviewed the patients History and Physical, chart, labs and discussed the procedure including the risks, benefits and alternatives for the proposed anesthesia with the patient or authorized representative who has indicated his/her understanding and acceptance.     Plan Discussed with: CRNA and Anesthesiologist  Anesthesia Plan Comments:         Anesthesia Quick Evaluation

## 2016-11-10 NOTE — Progress Notes (Signed)
PROGRESS NOTE    Connor Diaz  WUJ:[id number] DOB: [birth year] DOA: 11/06/2016 PCP: Oval Linsey, MD   Brief Narrative:  83 y.o. WM PMHx Nonischemic Cardiomyopathy (S/P pacemaker placement last checked 09/2016), CHRONIC Systolic CHF (EF of 25-30%), Atrial Fibrillation, CAD, AAA, CKD stage III, HTN,  March 2018 sustained a hip fracture. Since then patient and family note that he has been "declining"with multiple intervening acute medical problems. Patient is presently wheelchair bound due to fatigue and debilitation. He notes that he has been having a slow progressive decrease in his exercise tolerance over the past several months which has increased over the past week. Patient notes that over the past week he has been short of breath even at rest which is unusual for him. He admits to orthopnea and PND at home. Patient has been managed by his PCP with Lasix 40 twice a day and has been told that the Lasix dose was conservative due to concerns about kidney function. Patient is also noted increasing abdominal girth and markedly lower extremity edema which has been treated with wrapping of legs.   This morning patient was markedly short of breath and gasping for air at rest. He had an outpatient M.D. appointment and was told to come to the ED from that appointment. Patient denies any acute chest or shoulder or jaw pain.   Patient denies fevers or chills. No malaise other than his acutely worsening shortness of breath. No sweats. Patient has baseline cough but that has not really changed from usual. No sputum production.   ED Course:  The patient was noted to have 5 basilar opacification right greater than left with concern for possible pneumonia. Patient was started on BiPAP with market improvement in symptomatology. He was treated with cefepime and vancomycin. He was also given Lasix 60 mg IV 1 with modest output of maybe 300 mL.    Subjective: 9/25 A/O 4, negative CP, positive acute on  chronic SOB, negative N/V, negative abdominal pain, positive urinary retention   Assessment & Plan:   Principal Problem:   Acute respiratory failure with hypoxia (HCC) Active Problems:   CAD (coronary artery disease)   Acute combined systolic and diastolic heart failure (HCC)   Paroxysmal atrial fibrillation (HCC)   Prostate hypertrophy   HAP (hospital-acquired pneumonia)   Acute Respiratory Failure with Hypoxia -On admission SPO2= 64% on room air, placed on BiPAP -Asymptomatic for respiratory infection off antibiotics   Chronic systolic CHF  -Echocardiogram 9/56 LEFT ventricle: LVEF 25-30%.-Severe hypokinesis basal-mid inferior lateral myocardium. PA peak pressure = 39 mmHg    Recent Labs Lab 11/06/16 1106 11/06/16 1823 11/06/16 2202 11/07/16 0311  TROPONINI 0.03* <0.03 <0.03 <0.03  -Troponins negative -Coreg 3.125 mg BID -Strict in and out since admission -6.2 L -Daily weight Filed Weights   11/08/16 0436 11/09/16 0252 11/10/16 0353  Weight: 193 lb (87.5 kg) 184 lb (83.5 kg) 184 lb 1.4 oz (83.5 kg)  -Echocardiogram: EF unchanged significantly increased pulmonary HTN see results below   AAA without rupture -Increasing in size patient family aware Has refused further intervention  Pulmonary HTN -See CHF  Chronic Atrial fibrillation(CHADSVASC score 5) -See CHF -Eliquis -9/25 S/P on successful DC CV  CKD stage III(Baseline Cr 1.3-1.5)  Recent Labs Lab 11/06/16 1106 11/07/16 0311 11/08/16 0342 11/09/16 0229 11/10/16 0222  CREATININE 2.09* 1.87* 1.99* 2.10* 2.14*  -Lasix discontinued secondary to increasing creatinine  BPH -  Flomax 0.4 mg daily  HYPOTHYROIDISM -Synthroid 75 g daily   GERD Continue  Pepcid    DVT prophylaxis: Eliquis Code Status: DO NOT RESUSCITATE Family Communication: None Disposition Plan: TBD   Consultants:  Cardiology    Procedures/Significant Events:  -9/22 Echocardiogram: LVEF= 25% to 30%.- Diffuse hypokinesis worse  in the inferolateral and anterolateral myocardium. -Left atrium: The appendage was moderately dilated. -Pulmonary arteries: PA peak pressure: 61 mm Hg (S). 9/25 DC CV: Unsuccessful    I have personally reviewed and interpreted all radiology studies and my findings are as above.  VENTILATOR SETTINGS: None   Cultures 9/22 MRSA by PCR negative     Antimicrobials: Anti-infectives    Start     Dose/Rate Stop   11/07/16 1400  vancomycin (VANCOCIN) IVPB 1000 mg/200 mL premix     1,000 mg 200 mL/hr over 60 Minutes     11/07/16 1330  ceFEPIme (MAXIPIME) 1 g in dextrose 5 % 50 mL IVPB     1 g 100 mL/hr over 30 Minutes     11/06/16 1300  ceFEPIme (MAXIPIME) 2 g in dextrose 5 % 50 mL IVPB     2 g 100 mL/hr over 30 Minutes 11/06/16 1403   11/06/16 1300  vancomycin (VANCOCIN) 1,750 mg in sodium chloride 0.9 % 500 mL IVPB     1,750 mg 250 mL/hr over 120 Minutes 11/06/16 1830      Devices    LINES / TUBES:      Continuous Infusions: . sodium chloride 250 mL (11/09/16 2300)  . sodium chloride 250 mL (11/09/16 0955)  . sodium chloride       Objective: Vitals:   11/09/16 2300 11/09/16 2314 11/10/16 0353 11/10/16 0722  BP:  116/75    Pulse: 86 82  71  Resp: (!) 29 (!) 26  (!) 23  Temp:  98.3 F (36.8 C) 98.3 F (36.8 C)   TempSrc:  Oral Oral   SpO2: 94% 94%  99%  Weight:   184 lb 1.4 oz (83.5 kg)   Height:        Intake/Output Summary (Last 24 hours) at 11/10/16 0806 Last data filed at 11/10/16 0354  Gross per 24 hour  Intake           938.08 ml  Output              750 ml  Net           188.08 ml   Filed Weights   11/08/16 0436 11/09/16 0252 11/10/16 0353  Weight: 193 lb (87.5 kg) 184 lb (83.5 kg) 184 lb 1.4 oz (83.5 kg)    Physical Exam:  General: A/O 4, positive acute respiratory distress  Neck:  Negative scars, masses, torticollis, lymphadenopathy, JVD Lungs: Clear to auscultation bilaterally without wheezes or crackles Cardiovascular: Irregular  irregular rhythm and rate, without murmur gallop or rub normal S1 and S2 Abdomen: negative abdominal pain, nondistended, positive soft, bowel sounds, no rebound, no ascites, no appreciable mass Extremities: No significant cyanosis, clubbing. Positive bilateral pedal edema 1+ to thighs Skin: Bilateral lower extremity venous stasis ulcers covered and Ace wrap's.  Psychiatric:  Negative depression, negative anxiety, negative fatigue, negative mania  Central nervous system:  Cranial nerves II through XII intact, tongue/uvula midline, all extremities muscle strength 5/5, sensation intact throughout,  negative dysarthria, negative expressive aphasia, negative receptive aphasia.     .     Data Reviewed: Care during the described time interval was provided by me .  I have reviewed this patient's available data, including medical history, events of note,  physical examination, and all test results as part of my evaluation.   CBC:  Recent Labs Lab 11/06/16 1106 11/07/16 0952  WBC 6.3 6.0  HGB 12.3* 11.6*  HCT 37.2* 36.3*  MCV 100.0 100.3*  PLT 162 148*   Basic Metabolic Panel:  Recent Labs Lab 11/06/16 1106 11/07/16 0311 11/07/16 0952 11/08/16 0342 11/09/16 0229 11/10/16 0222  NA 137 137  --  137 135 134*  K 3.7 3.6  --  3.8 3.9 4.0  CL 104 102  --  99* 98* 95*  CO2 20* 25  --  31 30 32  GLUCOSE 94 98  --  95 124* 117*  BUN 34* 32*  --  34* 39* 45*  CREATININE 2.09* 1.87*  --  1.99* 2.10* 2.14*  CALCIUM 9.2 8.6*  --  8.6* 8.5* 8.5*  MG  --   --  1.9  --   --   --    GFR: Estimated Creatinine Clearance: 23.2 mL/min (A) (by C-G formula based on SCr of 2.14 mg/dL (H)). Liver Function Tests: No results for input(s): AST, ALT, ALKPHOS, BILITOT, PROT, ALBUMIN in the last 168 hours. No results for input(s): LIPASE, AMYLASE in the last 168 hours. No results for input(s): AMMONIA in the last 168 hours. Coagulation Profile: No results for input(s): INR, PROTIME in the last 168  hours. Cardiac Enzymes:  Recent Labs Lab 11/06/16 1106 11/06/16 1823 11/06/16 2202 11/07/16 0311  TROPONINI 0.03* <0.03 <0.03 <0.03   BNP (last 3 results) No results for input(s): PROBNP in the last 8760 hours. HbA1C: No results for input(s): HGBA1C in the last 72 hours. CBG: No results for input(s): GLUCAP in the last 168 hours. Lipid Profile: No results for input(s): CHOL, HDL, LDLCALC, TRIG, CHOLHDL, LDLDIRECT in the last 72 hours. Thyroid Function Tests: No results for input(s): TSH, T4TOTAL, FREET4, T3FREE, THYROIDAB in the last 72 hours. Anemia Panel: No results for input(s): VITAMINB12, FOLATE, FERRITIN, TIBC, IRON, RETICCTPCT in the last 72 hours. Urine analysis:    Component Value Date/Time   COLORURINE RED (A) 10/04/2016 1125   APPEARANCEUR TURBID (A) 10/04/2016 1125   LABSPEC  10/04/2016 1125    TEST NOT REPORTED DUE TO COLOR INTERFERENCE OF URINE PIGMENT   PHURINE  10/04/2016 1125    TEST NOT REPORTED DUE TO COLOR INTERFERENCE OF URINE PIGMENT   GLUCOSEU (A) 10/04/2016 1125    TEST NOT REPORTED DUE TO COLOR INTERFERENCE OF URINE PIGMENT   HGBUR (A) 10/04/2016 1125    TEST NOT REPORTED DUE TO COLOR INTERFERENCE OF URINE PIGMENT   BILIRUBINUR (A) 10/04/2016 1125    TEST NOT REPORTED DUE TO COLOR INTERFERENCE OF URINE PIGMENT   KETONESUR (A) 10/04/2016 1125    TEST NOT REPORTED DUE TO COLOR INTERFERENCE OF URINE PIGMENT   PROTEINUR (A) 10/04/2016 1125    TEST NOT REPORTED DUE TO COLOR INTERFERENCE OF URINE PIGMENT   NITRITE (A) 10/04/2016 1125    TEST NOT REPORTED DUE TO COLOR INTERFERENCE OF URINE PIGMENT   LEUKOCYTESUR (A) 10/04/2016 1125    TEST NOT REPORTED DUE TO COLOR INTERFERENCE OF URINE PIGMENT   Sepsis Labs: (procalcitonin:4,lacticidven:4)  ) Recent Results (from the past 240 hour(s))  MRSA PCR Screening     Status: None   Collection Time: 11/07/16  2:02 AM  Result Value Ref Range Status   MRSA by PCR NEGATIVE NEGATIVE Final     Comment:        The GeneXpert MRSA Assay (FDA approved for  NASAL specimens only), is one component of a comprehensive MRSA colonization surveillance program. It is not intended to diagnose MRSA infection nor to guide or monitor treatment for MRSA infections.          Radiology Studies: No results found.      Scheduled Meds: . amiodarone  100 mg Oral Daily  . apixaban  2.5 mg Oral BID  . atorvastatin  40 mg Oral q1800  . carvedilol  3.125 mg Oral BID WC  . famotidine  20 mg Oral QAC breakfast  . feeding supplement (ENSURE ENLIVE)  237 mL Oral BID BM  . ipratropium-albuterol  3 mL Nebulization Q4H WA  . levothyroxine  75 mcg Oral QAC breakfast  . mouth rinse  15 mL Mouth Rinse BID  . multivitamin with minerals  1 tablet Oral Daily  . omega-3 acid ethyl esters  1 g Oral Daily  . sodium chloride flush  3 mL Intravenous Q12H  . sodium chloride flush  3 mL Intravenous Q12H  . sodium chloride flush  3 mL Intravenous Q12H  . tamsulosin  0.4 mg Oral Daily  . torsemide  40 mg Oral Daily   Continuous Infusions: . sodium chloride 250 mL (11/09/16 2300)  . sodium chloride 250 mL (11/09/16 0955)  . sodium chloride       LOS: 4 days    Time spent: 40 minutes    WOODS, Roselind Messier, MD [facility] Pager [phone]   If 7PM-7AM, please contact night-coverage [email or web address] Password [email or web address] 11/10/2016, 8:06 AM

## 2016-11-10 NOTE — Anesthesia Postprocedure Evaluation (Signed)
Anesthesia Post Note  Patient: Connor Diaz  Procedure(s) Performed: Procedure(s) (LRB): CARDIOVERSION (N/A)     Patient location during evaluation: PACU Anesthesia Type: General Level of consciousness: awake, awake and alert and oriented Pain management: pain level controlled Vital Signs Assessment: post-procedure vital signs reviewed and stable Respiratory status: spontaneous breathing, nonlabored ventilation and respiratory function stable Cardiovascular status: blood pressure returned to baseline Anesthetic complications: no    Last Vitals:  Vitals:   11/10/16 1600 11/10/16 1710  BP: 108/75 115/73  Pulse: 72 86  Resp: 19   Temp: 36.9 C   SpO2: 93%     Last Pain:  Vitals:   11/10/16 1600  TempSrc: Oral  PainSc:                  Kirklin Mcduffee COKER

## 2016-11-10 NOTE — Care Management Note (Addendum)
Case Management Note Original Note Created by Donn Pierini RN, BSN Unit 4E-Case Manager-- 2C-weekend coverage [phone]  Patient Details  Name: Connor Diaz MRN: [id number] Date of Birth: 10/28/1923  Subjective/Objective:  Pt admitted with Acute Resp. Failure- secondary to pulm. Edema and HF, ?HAP               Action/Plan: PTA pt lived at home with wife- has been using w/c lately due to fatigue- CM will follow for d/c needs. May benefit from PT eval   Expected Discharge Date:                  Expected Discharge Plan:  Home w Home Health Services  In-House Referral:     Discharge planning Services  CM Consult  Post Acute Care Choice:    Choice offered to:     DME Arranged:    DME Agency:     HH Arranged:    HH Agency:     Status of Service:  In process, will continue to follow  If discussed at Long Length of Stay Meetings, dates discussed:    Discharge Disposition:   Additional Comments: Pt recently discharged home with [facility]PT, pt is also active with [facility] for home oxygen.  [facility] notified that pt admitted - pt will need HHPT resumption orders prior to discharge.  Pt is also wheelchair bound in the home  [name], [place] 11/10/2016, 2:37 PM

## 2016-11-10 NOTE — Transfer of Care (Signed)
Immediate Anesthesia Transfer of Care Note  Patient: [name]  Procedure(s) Performed: Procedure(s): CARDIOVERSION (N/A)  Patient Location: Endoscopy Unit  Anesthesia Type:General  Level of Consciousness: awake, alert , oriented and patient cooperative  Airway & Oxygen Therapy: Patient Spontanous Breathing and Patient connected to nasal cannula oxygen  Post-op Assessment: Report given to RN, Post -op Vital signs reviewed and stable and Patient moving all extremities X 4  Post vital signs: Reviewed and stable  Last Vitals:  Vitals:   11/10/16 0844 11/10/16 1012  BP: 119/79   Pulse: 71 69  Resp: 17 (!) 24  Temp: (!) 36.3 C 37 C  SpO2: 99% 98%    Last Pain:  Vitals:   11/10/16 1012  TempSrc: Oral  PainSc:          Complications: No apparent anesthesia complications

## 2016-11-10 NOTE — Anesthesia Procedure Notes (Signed)
Performed by: Ramonica Grigg P       

## 2016-11-11 ENCOUNTER — Inpatient Hospital Stay (HOSPITAL_COMMUNITY): Payer: Medicare Other

## 2016-11-11 ENCOUNTER — Encounter (HOSPITAL_COMMUNITY): Payer: Self-pay | Admitting: Cardiology

## 2016-11-11 LAB — PROCALCITONIN: Procalcitonin: <0.1 ng/mL

## 2016-11-11 LAB — BASIC METABOLIC PANEL
Anion gap: 8 (ref 5–15)
BUN: 44 mg/dL — AB (ref 6–20)
CALCIUM: 8.5 mg/dL — AB (ref 8.9–10.3)
CO2: 29 mmol/L (ref 22–32)
CREATININE: 2.29 mg/dL — AB (ref 0.61–1.24)
Chloride: 97 mmol/L — ABNORMAL LOW (ref 101–111)
GFR calc Af Amer: 27 mL/min — ABNORMAL LOW (ref >60)
GFR, EST NON AFRICAN AMERICAN: 23 mL/min — AB (ref >60)
GLUCOSE: 99 mg/dL (ref 65–99)
Potassium: 4 mmol/L (ref 3.5–5.1)
Sodium: 134 mmol/L — ABNORMAL LOW (ref 135–145)

## 2016-11-11 LAB — CBC
HEMATOCRIT: 36.2 % — AB (ref 39.0–52.0)
Hemoglobin: 11.8 g/dL — ABNORMAL LOW (ref 13.0–17.0)
MCH: 33 pg (ref 26.0–34.0)
MCHC: 32.6 g/dL (ref 30.0–36.0)
MCV: 101.1 fL — AB (ref 78.0–100.0)
Platelets: 170 10*3/uL (ref 150–400)
RBC: 3.58 MIL/uL — ABNORMAL LOW (ref 4.22–5.81)
RDW: 18.6 % — AB (ref 11.5–15.5)
WBC: 6.7 10*3/uL (ref 4.0–10.5)

## 2016-11-11 LAB — MAGNESIUM: Magnesium: 2.1 mg/dL (ref 1.7–2.4)

## 2016-11-11 LAB — BRAIN NATRIURETIC PEPTIDE: B NATRIURETIC PEPTIDE 5: 1137.4 pg/mL — AB (ref 0.0–100.0)

## 2016-11-11 MED ORDER — TORSEMIDE 20 MG PO TABS: ORAL_TABLET | ORAL | 6 refills | Status: AC

## 2016-11-11 MED ORDER — FUROSEMIDE 10 MG/ML IJ SOLN
INTRAMUSCULAR | Status: AC
  Filled 2016-11-11: qty 4

## 2016-11-11 MED ORDER — METHYLPREDNISOLONE SODIUM SUCC 125 MG IJ SOLR
60.0000 mg | Freq: Once | INTRAMUSCULAR | Status: AC
  Administered 2016-11-11: 60 mg via INTRAVENOUS
  Filled 2016-11-11: qty 2

## 2016-11-11 MED ORDER — IPRATROPIUM-ALBUTEROL 0.5-2.5 (3) MG/3ML IN SOLN
3.0000 mL | Freq: Four times a day (QID) | RESPIRATORY_TRACT | Status: DC
  Administered 2016-11-11 – 2016-11-16 (×18): 3 mL via RESPIRATORY_TRACT
  Filled 2016-11-11 (×19): qty 3

## 2016-11-11 MED ORDER — CARVEDILOL 6.25 MG PO TABS
6.2500 mg | ORAL_TABLET | Freq: Two times a day (BID) | ORAL | Status: DC
  Administered 2016-11-11 – 2016-11-20 (×19): 6.25 mg via ORAL
  Filled 2016-11-11 (×19): qty 1

## 2016-11-11 MED ORDER — TORSEMIDE 20 MG PO TABS: 40.0000 mg | ORAL_TABLET | Freq: Every day | ORAL | Status: DC

## 2016-11-11 MED ORDER — CARVEDILOL 6.25 MG PO TABS: 6.2500 mg | ORAL_TABLET | Freq: Two times a day (BID) | ORAL | 6 refills | Status: AC

## 2016-11-11 MED ORDER — CARVEDILOL 6.25 MG PO TABS: 6.2500 mg | ORAL_TABLET | Freq: Two times a day (BID) | ORAL | Status: DC

## 2016-11-11 MED ORDER — LORAZEPAM 2 MG/ML IJ SOLN
0.5000 mg | Freq: Four times a day (QID) | INTRAMUSCULAR | Status: DC | PRN
  Administered 2016-11-13 – 2016-11-16 (×2): 0.5 mg via INTRAVENOUS
  Filled 2016-11-11 (×2): qty 1

## 2016-11-11 MED ORDER — IPRATROPIUM-ALBUTEROL 0.5-2.5 (3) MG/3ML IN SOLN
3.0000 mL | Freq: Four times a day (QID) | RESPIRATORY_TRACT | Status: DC
  Administered 2016-11-11: 3 mL via RESPIRATORY_TRACT
  Filled 2016-11-11: qty 3

## 2016-11-11 MED ORDER — MORPHINE SULFATE (PF) 2 MG/ML IV SOLN
1.0000 mg | INTRAVENOUS | Status: DC | PRN
  Administered 2016-11-15 (×2): 2 mg via INTRAVENOUS
  Administered 2016-11-16: 1 mg via INTRAVENOUS
  Filled 2016-11-11 (×3): qty 1

## 2016-11-11 MED ORDER — FUROSEMIDE 10 MG/ML IJ SOLN
40.0000 mg | Freq: Once | INTRAMUSCULAR | Status: AC
  Administered 2016-11-11: 40 mg via INTRAVENOUS
  Filled 2016-11-11: qty 4

## 2016-11-11 MED ORDER — FUROSEMIDE 10 MG/ML IJ SOLN
80.0000 mg | Freq: Two times a day (BID) | INTRAMUSCULAR | Status: DC
  Administered 2016-11-11: 80 mg via INTRAVENOUS
  Administered 2016-11-11: 40 mg via INTRAVENOUS
  Filled 2016-11-11 (×2): qty 8

## 2016-11-11 MED ORDER — TORSEMIDE 20 MG PO TABS
20.0000 mg | ORAL_TABLET | Freq: Every day | ORAL | Status: DC
  Administered 2016-11-11: 20 mg via ORAL
  Filled 2016-11-11: qty 1

## 2016-11-11 MED ORDER — FUROSEMIDE 10 MG/ML IJ SOLN: 40.0000 mg | Freq: Once | INTRAMUSCULAR | Status: AC

## 2016-11-11 MED ORDER — TORSEMIDE 20 MG PO TABS: ORAL_TABLET | ORAL | 6 refills | Status: DC

## 2016-11-11 MED ORDER — DEXTROSE 5 % IV SOLN
2.0000 g | INTRAVENOUS | Status: AC
  Administered 2016-11-11 – 2016-11-15 (×5): 2 g via INTRAVENOUS
  Filled 2016-11-11 (×5): qty 2

## 2016-11-11 NOTE — Progress Notes (Signed)
Patient's 02 saturations 81% despite being on 15L HFNC. Patient having increased SOB. Placed back on bipap, increased IPAP to 16 and EPAP to 8 and FIO2 to 80%. SP02 92%. RT will continue to monitor.

## 2016-11-11 NOTE — Progress Notes (Signed)
RT at bedside; neb tx compelted; pt continues to be very SOB; sats 94% on O2 @ 6L/HFNC; gasping for air.  Dr. Sharon Seller repaged via phone.  Await response

## 2016-11-11 NOTE — Progress Notes (Signed)
Dr. Sharon Seller at bedside (was in family conference w/another patient); new orders received; pt administered IV Lasix 40mg .  Resps improving on BiPAP.  Wife and step-son at bedside.  Will continue to monitor closely.

## 2016-11-11 NOTE — Progress Notes (Signed)
[place] TEAM 1 - Stepdown/ICU TEAM  CANDICE MISNER  RSW:[id number] DOB: 1923-11-25 DOA: 11/06/2016 PCP: Oval Linsey, MD    Brief Narrative:  83yo M w/ Hx Nonischemic Cardiomyopathy (S/P pacemaker placement last checked 09/2016), Chronic Systolic CHF (EF of 25-30%), Atrial Fibrillation, CAD, AAA, CKD stage III, HTN, and a hip fx in March 2018 after which he has been "declining" with multiple intervening acute medical problems who presented to the hospital w/ dyspnea.   In the ED the patient was noted to have bibasilar opacification right greater than left with concern for possible pneumonia.   Subjective: The patient developed the acute onset of severe shortness of breath this morning.  This was associated by diffuse crackles as well as mild expiratory wheezing.  He was quite anxious with this.  He denied chest pain abdominal pain nausea or vomiting.  He was dosed with Lasix, Solu-Medrol, albuterol, and placed on BiPAP after which he rapidly improved.  Assessment & Plan:  Acute hypoxic respiratory failure  Wean O2 as able - remains quite tenuous   Acute exacerbation of Chronic systolic congestive heart failure EF 25-30% - ischemic in etiology - care per CHF Team   Wheezing - Acute bronchospasm tx w/ steroids - avoid scheduled does as able due to high risk of acute delirium   Possible PNA Given his acute decline abx was resumed empirically today - cont for a 5 day course and follow clinically   AAA  CAD - mild troponin elevation  Demand ischemia due to volume overload   Chronic persistent atrial fibrillation Chadsvasc is 5 - cont eliquis and amio - DCCV 9/25 was not successful    Acute kidney injury on CKD stage III Baseline creatinine 1.3-1.5 - follow w/ ongoing attempts to diurese   Recent Labs Lab 11/07/16 0311 11/08/16 0342 11/09/16 0229 11/10/16 0222 11/11/16 0252  CREATININE 1.87* 1.99* 2.10* 2.14* 2.29*   BPH Cont flomax  Hypothyroidism Cont home  synthroid dose   L LE w/ 3 areas of full thickness wounds Care as per WOC Team   GERD  DVT prophylaxis: eliquis Code Status: DNR - NO CODE Family Communication: spoke w/ wife at bedside   Disposition Plan: SDU  Consultants:  Cardiology   Procedures: TTE 9/22 EF 25-30% with diffuse hypokinesis   Antimicrobials:  Cefepime 9/21 > 9/23 + 9/26 > Vancomycin 9/21 > 9/23    Objective: Blood pressure 110/79, pulse 78, temperature (!) 97.5 F (36.4 C), temperature source Oral, resp. rate (!) 22, height 5\' 10"  (1.778 m), weight 83.2 kg (183 lb 6.8 oz), SpO2 98 %.  Intake/Output Summary (Last 24 hours) at 11/11/16 1221 Last data filed at 11/11/16 0956  Gross per 24 hour  Intake              726 ml  Output             2200 ml  Net            -1474 ml   Filed Weights   11/09/16 0252 11/10/16 0353 11/11/16 0500  Weight: 83.5 kg (184 lb) 83.5 kg (184 lb 1.4 oz) 83.2 kg (183 lb 6.8 oz)    Examination: General: acute resp distress w/ anxiety  Lungs: Diffuse coarse crackles with mild expiratory wheezing but good air movement bilaterally Cardiovascular: Tachycardic and irregular without murmur Abdomen: Nontender, nondistended, soft Extremities: No significant edema bilateral lower extremities  CBC:  Recent Labs Lab 11/06/16 1106 11/07/16 0952 11/11/16 1030  WBC 6.3 6.0  6.7  HGB 12.3* 11.6* 11.8*  HCT 37.2* 36.3* 36.2*  MCV 100.0 100.3* 101.1*  PLT 162 148* 170   Basic Metabolic Panel:  Recent Labs Lab 11/07/16 0311 11/07/16 0952 11/08/16 0342 11/09/16 0229 11/10/16 0222 11/10/16 0800 11/11/16 0252  NA 137  --  137 135 134*  --  134*  K 3.6  --  3.8 3.9 4.0  --  4.0  CL 102  --  99* 98* 95*  --  97*  CO2 25  --  31 30 32  --  29  GLUCOSE 98  --  95 124* 117*  --  99  BUN 32*  --  34* 39* 45*  --  44*  CREATININE 1.87*  --  1.99* 2.10* 2.14*  --  2.29*  CALCIUM 8.6*  --  8.6* 8.5* 8.5*  --  8.5*  MG  --  1.9  --   --   --  2.2 2.1   GFR: Estimated Creatinine  Clearance: 21.7 mL/min (A) (by C-G formula based on SCr of 2.29 mg/dL (H)).  Liver Function Tests: No results for input(s): AST, ALT, ALKPHOS, BILITOT, PROT, ALBUMIN in the last 168 hours. No results for input(s): LIPASE, AMYLASE in the last 168 hours. No results for input(s): AMMONIA in the last 168 hours.   Cardiac Enzymes:  Recent Labs Lab 11/06/16 1106 11/06/16 1823 11/06/16 2202 11/07/16 0311  TROPONINI 0.03* <0.03 <0.03 <0.03     Recent Results (from the past 240 hour(s))  MRSA PCR Screening     Status: None   Collection Time: 11/07/16  2:02 AM  Result Value Ref Range Status   MRSA by PCR NEGATIVE NEGATIVE Final    Comment:        The GeneXpert MRSA Assay (FDA approved for NASAL specimens only), is one component of a comprehensive MRSA colonization surveillance program. It is not intended to diagnose MRSA infection nor to guide or monitor treatment for MRSA infections.      Scheduled Meds: . apixaban  2.5 mg Oral BID  . atorvastatin  40 mg Oral q1800  . carvedilol  6.25 mg Oral BID WC  . famotidine  20 mg Oral QAC breakfast  . feeding supplement (ENSURE ENLIVE)  237 mL Oral BID BM  . ipratropium-albuterol  3 mL Nebulization Q6H  . levothyroxine  75 mcg Oral QAC breakfast  . mouth rinse  15 mL Mouth Rinse BID  . methylPREDNISolone (SOLU-MEDROL) injection  60 mg Intravenous Once  . multivitamin with minerals  1 tablet Oral Daily  . omega-3 acid ethyl esters  1 g Oral Daily  . sodium chloride flush  3 mL Intravenous Q12H  . sodium chloride flush  3 mL Intravenous Q12H  . sodium chloride flush  3 mL Intravenous Q12H  . tamsulosin  0.4 mg Oral Daily  . [START ON 11/12/2016] torsemide  40 mg Oral Daily     LOS: 5 days   Lonia Blood, MD [facility] Office  [phone] Pager - Text Page per Amion as per below:  On-Call/Text Page:      [email or web address]      password [email or web address]  If 7PM-7AM, please contact night-coverage [email or web address] Password  [facility] 11/11/2016, 12:21 PM

## 2016-11-11 NOTE — Progress Notes (Signed)
RT placed patient on BiPAP @ 50% per PRN order; continue to await MD response.  Patient w/almost immediate positive results from BiPAP application.  Will continue to pursue diuresis if appropriate per MD.

## 2016-11-11 NOTE — Progress Notes (Signed)
PT Cancellation Note  Patient Details Name: Connor Diaz MRN: [id number] DOB: [birth year]   Cancelled Treatment:    Reason Eval/Treat Not Completed: Medical issues which prohibited therapy Pt with respiratory distress and on BIPAP this pm. Hold til tomorrow per RN. PT will continue to follow acutely.    Derek Mound, PTA Pager: [phone]   11/11/2016, 4:10 PM

## 2016-11-11 NOTE — Progress Notes (Signed)
Patient breathing much better on BiPAP.  CHF team wants another Lasix 40mg  IVP in addition to 80mg  IVP scheduled for 1800.  Will continue to monitor closely

## 2016-11-11 NOTE — Progress Notes (Signed)
Patient taken off BiPAP in order to eat supper and to take PO meds; resps unlabored at this time.  Placed on O2 @ 8L/HFNC.  Multiple family members at bedside.  Will continue to monitor

## 2016-11-11 NOTE — Progress Notes (Signed)
Family asked for assistance w/patient d/t respiratory distress.  Pt w/RR high 20s; sats 91-96% on 6L HFNC but patient does appear labored w/accessory muscle use.  RT paged but unavailable to assist; RN administers PRN albuterol treatment. CXR completed.  Dr. Sharon Seller paged

## 2016-11-11 NOTE — Progress Notes (Signed)
Patient ID: Connor Diaz, male   DOB: [birth year], 83 y.o.   MRN: [id number]     Advanced Heart Failure Rounding Note  Primary Cardiologist: Allred  Subjective:    Failed DCCV 11/10/16. Amiodarone stopped. Coreg increased.   Still with congested cough. Clear/white sputum production.   Creatinine 2.1 -> 2.29. K 4.0. Down 17 lbs from admission.   Echo (9/18): EF 25-30%, mild LVH, mildly dilated RV with normal systolic function, PASP 61 mmHg.   Objective:   Weight Range: 183 lb 6.8 oz (83.2 kg) Body mass index is 26.32 kg/m.   Vital Signs:   Temp:  [97.3 F (36.3 C)-98.6 F (37 C)] 97.5 F (36.4 C) (09/26 0751) Pulse Rate:  [60-128] 68 (09/26 0719) Resp:  [17-25] 23 (09/26 0719) BP: (97-119)/(59-84) 117/84 (09/26 0751) SpO2:  [81 %-100 %] 92 % (09/26 0719) Weight:  [183 lb 6.8 oz (83.2 kg)] 183 lb 6.8 oz (83.2 kg) (09/26 0500) Last BM Date: 11/09/16  Weight change: Filed Weights   11/09/16 0252 11/10/16 0353 11/11/16 0500  Weight: 184 lb (83.5 kg) 184 lb 1.4 oz (83.5 kg) 183 lb 6.8 oz (83.2 kg)    Intake/Output:   Intake/Output Summary (Last 24 hours) at 11/11/16 8469 Last data filed at 11/11/16 0324  Gross per 24 hour  Intake             1006 ml  Output             2000 ml  Net             -994 ml      Physical Exam    General: Elderly and chronically ill appearing. NAD.  HEENT: Normal Neck: Supple. JVP 6-7. Carotids 2+ bilat; no bruits. No thyromegaly or nodule noted. Cor: PMI nondisplaced. RRR, No M/G/R noted Lungs: CTAB, normal effort. Abdomen: Soft, non-tender, non-distended, no HSM. No bruits or masses. +BS  Extremities: No cyanosis, clubbing, or rash. Trace to 1+ ankle edema. Ulceration LLE.  Neuro: Alert & orientedx3, cranial nerves grossly intact. moves all 4 extremities w/o difficulty. Affect pleasant   Telemetry   Atrial fibrillation with V pacing, Personally reviewed.   Labs    CBC No results for input(s): WBC, NEUTROABS, HGB, HCT,  MCV, PLT in the last 72 hours. Basic Metabolic Panel  Recent Labs  11/10/16 0222 11/10/16 0800 11/11/16 0252  NA 134*  --  134*  K 4.0  --  4.0  CL 95*  --  97*  CO2 32  --  29  GLUCOSE 117*  --  99  BUN 45*  --  44*  CREATININE 2.14*  --  2.29*  CALCIUM 8.5*  --  8.5*  MG  --  2.2 2.1   Liver Function Tests No results for input(s): AST, ALT, ALKPHOS, BILITOT, PROT, ALBUMIN in the last 72 hours. No results for input(s): LIPASE, AMYLASE in the last 72 hours. Cardiac Enzymes No results for input(s): CKTOTAL, CKMB, CKMBINDEX, TROPONINI in the last 72 hours.  BNP: BNP (last 3 results)  Recent Labs  04/22/16 0929 05/21/16 1140 11/06/16 1106  BNP 596.7* 962.0* 940.4*    ProBNP (last 3 results) No results for input(s): PROBNP in the last 8760 hours.   D-Dimer No results for input(s): DDIMER in the last 72 hours. Hemoglobin A1C No results for input(s): HGBA1C in the last 72 hours. Fasting Lipid Panel No results for input(s): CHOL, HDL, LDLCALC, TRIG, CHOLHDL, LDLDIRECT in the last 72 hours. Thyroid Function Tests  No results for input(s): TSH, T4TOTAL, T3FREE, THYROIDAB in the last 72 hours.  Invalid input(s): FREET3  Other results:   Imaging   No results found.  Medications:     Scheduled Medications: . apixaban  2.5 mg Oral BID  . atorvastatin  40 mg Oral q1800  . carvedilol  3.125 mg Oral BID WC  . famotidine  20 mg Oral QAC breakfast  . feeding supplement (ENSURE ENLIVE)  237 mL Oral BID BM  . ipratropium-albuterol  3 mL Nebulization Q4H WA  . levothyroxine  75 mcg Oral QAC breakfast  . mouth rinse  15 mL Mouth Rinse BID  . multivitamin with minerals  1 tablet Oral Daily  . omega-3 acid ethyl esters  1 g Oral Daily  . sodium chloride flush  3 mL Intravenous Q12H  . sodium chloride flush  3 mL Intravenous Q12H  . sodium chloride flush  3 mL Intravenous Q12H  . tamsulosin  0.4 mg Oral Daily  . torsemide  40 mg Oral Daily    Infusions: . sodium  chloride Stopped (11/10/16 1930)  . sodium chloride 250 mL (11/09/16 0955)  . sodium chloride Stopped (11/10/16 1146)    PRN Medications: sodium chloride, acetaminophen, albuterol, nitroGLYCERIN, ondansetron (ZOFRAN) IV, sodium chloride flush, sodium chloride flush, sodium chloride flush    Patient Profile   Mr Veley is 83 year old with chronic systolic heart failure, A fib, and limited mobility.  Admitted with hypoxic respiratory failure and marked volume overload.   Assessment/Plan   1. Acute on chronic systolic CHF: Ischemic cardiomyopathy.  Echo (9/18) with EF 25-30% with D-shaped ventricle suggestive of RV pressure/volume overload.  He is on home oxygen, 3 L by [place].  He has developed symptoms and lower extremity edema over the last 2-3 weeks prior to admission.  Per Dr. [name] last note, he went into atrial fibrillation persistently in August.  It is possible that this exacerbation was triggered by loss of sinus rhythm.  Initially volume overloaded on exam, weight now down with diuresis and volume status looks better.   - With elevated creatinine, for home would give torsemide 40 daily alternating with 20 mg daily. - Continue Coreg 6.25 mg BID.   - Hold off on ACEI/ARB/ARNI for now with elevated creatinine.  - Restoration of NSR may help considerably in the long-term, unfortunately, he failed DCCV. Will focus on rate control.  2. CAD: Ischemic cardiomyopathy. - No s/s of ischemia. Mild troponin elevation is likely due to demand ischemia from volume overload.   - Continue statin - No ASA given stable CAD and apixaban use.  3. Atrial fibrillation: Persistent since August.  He has not missed any Eliquis (on reduced dose with age and elevated creatinine).  Possible that atrial fibrillation triggered his decompensation.  - Continue apixaban.   - Failed DCCV. Plan for rate control .  4. AKI on CKD stage 3: - Elevated to 2.29. Continue to follow closely. Will need close outpatient  follow up.  5. Deconditioning: Has been wheelchair-bound since he fractured his hip in the spring. PT following. Recommending HHPT.   6. Symptomatic bradycardia: Has St Jude dual chamber pacemaker.  With cardiomyopathy, would limit RV pacing. Back up rate decreased to 60 bpm this admit.  7. Leg ulcers:  - WOC following.   Length of Stay: [address]  [employer]  11/11/2016, 8:22 AM  Advanced Heart Failure Team Pager [phone] (M-F; 7a - 4p)  Please contact [facility] for night-coverage after  hours (4p -7a ) and weekends on [email or web address]  Patient seen with PA, agree with the above note.  When I saw patient this morning, he was feeling more short of breath and was wheezy/rhonchorous on lung exam.  Does not look particularly volume overloaded and weight has come down, he has been transitioned to po diuretics with mild rise in creatinine since admission (baseline CKD stage 3).    There was initial concern for infection but antibiotics were stopped as CHF was thought to be the main problem.  He is on home oxygen but has not been diagnosed with COPD or asthma in the past.  Pulmonary exam has worsened as above.  - Repeat CXR and will order BNP.  If looks like pulmonary edema, will resume IV Lasix.  - Will get CBC and procalcitonin => currently off antibiotics and afebrile.  May need resumption of abx for ?PNA.  - Will order nebs.   [name] 11/11/2016 10:17 AM

## 2016-11-11 NOTE — Progress Notes (Signed)
Attempted to take patient off BIPAP and place on 15L HFNC. Patient's 02 saturation dropped to 85%. BIPAP placed back on. 16/8 60%. RT will continue to monitor.

## 2016-11-11 NOTE — Progress Notes (Signed)
Patient off bipap when I arrived in room to give nebulizer treatment. Patient on 8L HFNC with 02 saturations at 93%. BIPAP is on standby at bedside If needed. RT will continue to monitor.

## 2016-11-12 ENCOUNTER — Inpatient Hospital Stay (HOSPITAL_COMMUNITY): Payer: Medicare Other

## 2016-11-12 DIAGNOSIS — J9602 Acute respiratory failure with hypercapnia: Secondary | ICD-10-CM

## 2016-11-12 DIAGNOSIS — I5023 Acute on chronic systolic (congestive) heart failure: Secondary | ICD-10-CM

## 2016-11-12 LAB — BASIC METABOLIC PANEL
ANION GAP: 9 (ref 5–15)
BUN: 50 mg/dL — ABNORMAL HIGH (ref 6–20)
CALCIUM: 8.5 mg/dL — AB (ref 8.9–10.3)
CO2: 27 mmol/L (ref 22–32)
Chloride: 96 mmol/L — ABNORMAL LOW (ref 101–111)
Creatinine, Ser: 2.15 mg/dL — ABNORMAL HIGH (ref 0.61–1.24)
GFR calc Af Amer: 29 mL/min — ABNORMAL LOW (ref >60)
GFR, EST NON AFRICAN AMERICAN: 25 mL/min — AB (ref >60)
GLUCOSE: 196 mg/dL — AB (ref 65–99)
Potassium: 4.8 mmol/L (ref 3.5–5.1)
SODIUM: 132 mmol/L — AB (ref 135–145)

## 2016-11-12 LAB — CBC
HCT: 35 % — ABNORMAL LOW (ref 39.0–52.0)
HEMOGLOBIN: 11.4 g/dL — AB (ref 13.0–17.0)
MCH: 32.6 pg (ref 26.0–34.0)
MCHC: 32.6 g/dL (ref 30.0–36.0)
MCV: 100 fL (ref 78.0–100.0)
Platelets: 166 10*3/uL (ref 150–400)
RBC: 3.5 MIL/uL — ABNORMAL LOW (ref 4.22–5.81)
RDW: 17.6 % — AB (ref 11.5–15.5)
WBC: 5.7 10*3/uL (ref 4.0–10.5)

## 2016-11-12 MED ORDER — PRO-STAT SUGAR FREE PO LIQD
30.0000 mL | Freq: Two times a day (BID) | ORAL | Status: DC
  Administered 2016-11-12 – 2016-11-20 (×17): 30 mL via ORAL
  Filled 2016-11-12 (×17): qty 30

## 2016-11-12 MED ORDER — METHYLPREDNISOLONE SODIUM SUCC 125 MG IJ SOLR
60.0000 mg | Freq: Once | INTRAMUSCULAR | Status: AC
  Administered 2016-11-12: 60 mg via INTRAVENOUS
  Filled 2016-11-12: qty 2

## 2016-11-12 MED ORDER — SODIUM CHLORIDE 0.9% FLUSH
10.0000 mL | Freq: Two times a day (BID) | INTRAVENOUS | Status: DC
  Administered 2016-11-12 – 2016-11-20 (×14): 10 mL

## 2016-11-12 MED ORDER — FUROSEMIDE 10 MG/ML IJ SOLN
40.0000 mg | Freq: Once | INTRAMUSCULAR | Status: AC
  Administered 2016-11-12: 40 mg via INTRAVENOUS
  Filled 2016-11-12: qty 4

## 2016-11-12 MED ORDER — SODIUM CHLORIDE 0.9% FLUSH
10.0000 mL | INTRAVENOUS | Status: DC | PRN
  Administered 2016-11-13: 10 mL
  Filled 2016-11-12: qty 40

## 2016-11-12 MED ORDER — FUROSEMIDE 10 MG/ML IJ SOLN
80.0000 mg | Freq: Two times a day (BID) | INTRAMUSCULAR | Status: DC
  Administered 2016-11-12 (×2): 80 mg via INTRAVENOUS
  Filled 2016-11-12 (×2): qty 8

## 2016-11-12 MED ORDER — DEXTROSE 5 % IV SOLN
120.0000 mg | Freq: Two times a day (BID) | INTRAVENOUS | Status: DC
  Filled 2016-11-12: qty 12

## 2016-11-12 NOTE — Progress Notes (Addendum)
Patient ID: Connor Diaz, male   DOB: 1923-06-27, 83 y.o.   MRN: [id number]     Advanced Heart Failure Rounding Note  Primary Cardiologist: Allred  Subjective:    Failed DCCV 11/10/16. Amiodarone stopped. Coreg increased.   Breathing better this morning but still requiring NRBM and wheezing.  He got a dose of Solumedrol yesterday and restarted abx. CXR showed pulmonary edema, cannot rule out PNA.  WBCs normal however and afebrile.  PCT < 0.01 but BNP 1134.    Creatinine 2.1 -> 2.29 -> 2.15.   Echo (9/18): EF 25-30%, mild LVH, mildly dilated RV with normal systolic function, PASP 61 mmHg.   Objective:   Weight Range: 190 lb (86.2 kg) Body mass index is 27.26 kg/m.   Vital Signs:   Temp:  [97.5 F (36.4 C)-98.2 F (36.8 C)] 97.6 F (36.4 C) (09/27 0336) Pulse Rate:  [65-78] 70 (09/27 0820) Resp:  [19-29] 22 (09/27 0400) BP: (93-126)/(66-102) 102/74 (09/27 0820) SpO2:  [92 %-100 %] 100 % (09/27 0756) FiO2 (%):  [35 %] 35 % (09/26 1526) Weight:  [190 lb (86.2 kg)] 190 lb (86.2 kg) (09/27 0500) Last BM Date: 11/11/16  Weight change: Filed Weights   11/10/16 0353 11/11/16 0500 11/12/16 0500  Weight: 184 lb 1.4 oz (83.5 kg) 183 lb 6.8 oz (83.2 kg) 190 lb (86.2 kg)    Intake/Output:   Intake/Output Summary (Last 24 hours) at 11/12/16 0902 Last data filed at 11/12/16 0300  Gross per 24 hour  Intake              480 ml  Output             1175 ml  Net             -695 ml      Physical Exam    General: NAD Neck: JVP 10-12 cm, no thyromegaly or thyroid nodule.  Lungs: Bilateral wheezing.  CV: Nondisplaced PMI.  Heart irregular S1/S2, no S3/S4, no murmur. 1+ ankle edema, lower legs wrapped.   Abdomen: Soft, nontender, no hepatosplenomegaly, no distention.  Skin: Intact without lesions or rashes.  Neurologic: Alert and oriented x 3.  Psych: Normal affect. Extremities: No clubbing or cyanosis.  HEENT: Normal.    Telemetry   Atrial fibrillation with V pacing,  personally reviewed.   Labs    CBC  Recent Labs  11/11/16 1030 11/12/16 0408  WBC 6.7 5.7  HGB 11.8* 11.4*  HCT 36.2* 35.0*  MCV 101.1* 100.0  PLT 170 166   Basic Metabolic Panel  Recent Labs  11/10/16 0800 11/11/16 0252 11/12/16 0408  NA  --  134* 132*  K  --  4.0 4.8  CL  --  97* 96*  CO2  --  29 27  GLUCOSE  --  99 196*  BUN  --  44* 50*  CREATININE  --  2.29* 2.15*  CALCIUM  --  8.5* 8.5*  MG 2.2 2.1  --    Liver Function Tests No results for input(s): AST, ALT, ALKPHOS, BILITOT, PROT, ALBUMIN in the last 72 hours. No results for input(s): LIPASE, AMYLASE in the last 72 hours. Cardiac Enzymes No results for input(s): CKTOTAL, CKMB, CKMBINDEX, TROPONINI in the last 72 hours.  BNP: BNP (last 3 results)  Recent Labs  05/21/16 1140 11/06/16 1106 11/11/16 1030  BNP 962.0* 940.4* 1,137.4*    ProBNP (last 3 results) No results for input(s): PROBNP in the last 8760 hours.   D-Dimer No  results for input(s): DDIMER in the last 72 hours. Hemoglobin A1C No results for input(s): HGBA1C in the last 72 hours. Fasting Lipid Panel No results for input(s): CHOL, HDL, LDLCALC, TRIG, CHOLHDL, LDLDIRECT in the last 72 hours. Thyroid Function Tests No results for input(s): TSH, T4TOTAL, T3FREE, THYROIDAB in the last 72 hours.  Invalid input(s): FREET3  Other results:   Imaging   Dg Chest Port 1 View  Result Date: 11/11/2016 CLINICAL DATA:  Shortness of Breath EXAM: PORTABLE CHEST 1 VIEW COMPARISON:  November 06, 2016 FINDINGS: There is diffuse interstitial edema with patchy alveolar consolidation in both lung bases. There are small pleural effusions bilaterally. There is cardiomegaly with mild pulmonary venous hypertension. There is aortic atherosclerosis. Pacemaker leads are attached to the right atrium and right ventricle. No adenopathy. There is calcification in the right carotid artery. There is superior migration of both humeral heads. IMPRESSION: Evidence  of a degree of congestive heart failure, with slightly more edema compared to most recent study. Airspace consolidation in the bases is likely in part to alveolar edema, although pneumonia in the bases cannot be excluded radiographically. Both pneumonia and edema may be present concurrently. There is aortic atherosclerosis. There is right carotid artery calcification. Pacemaker leads attached to right atrium and right ventricle. There is superior migration of both humeral heads, findings indicative of rotator cuff tears bilaterally. Aortic Atherosclerosis (ICD10-I70.0). Electronically Signed   By: Bretta Bang III M.D.   On: 11/11/2016 10:53    Medications:     Scheduled Medications: . apixaban  2.5 mg Oral BID  . atorvastatin  40 mg Oral q1800  . carvedilol  6.25 mg Oral BID WC  . famotidine  20 mg Oral QAC breakfast  . feeding supplement (ENSURE ENLIVE)  237 mL Oral BID BM  . ipratropium-albuterol  3 mL Nebulization QID  . levothyroxine  75 mcg Oral QAC breakfast  . mouth rinse  15 mL Mouth Rinse BID  . methylPREDNISolone (SOLU-MEDROL) injection  60 mg Intravenous Once  . multivitamin with minerals  1 tablet Oral Daily  . omega-3 acid ethyl esters  1 g Oral Daily  . tamsulosin  0.4 mg Oral Daily    Infusions: . ceFEPime (MAXIPIME) IV Stopped (11/11/16 1421)  . furosemide      PRN Medications: acetaminophen, albuterol, LORazepam, morphine injection, nitroGLYCERIN, ondansetron (ZOFRAN) IV    Patient Profile   Mr Linsey is 83 year old with chronic systolic heart failure, A fib, and limited mobility.  Admitted with hypoxic respiratory failure and marked volume overload.   Assessment/Plan   1. Acute on chronic systolic CHF: Ischemic cardiomyopathy.  Echo (9/18) with EF 25-30% with D-shaped ventricle suggestive of RV pressure/volume overload.  He is on home oxygen, 3 L by [place].  He has developed symptoms and lower extremity edema over the last 2-3 weeks prior to admission. He  diuresed well initially but developed respiratory distress on 9/26 and IV diuresis restarted.  On exam today, I suspect that he is still volume overloaded.  Creatinine stable at 2.15.  He did not diurese vigorously yesterday. - Increase Lasix to 120 mg IV bid today.  - Continue Coreg 6.25 mg BID.   - Hold off on ACEI/ARB/ARNI for now with elevated creatinine.  2. CAD: Ischemic cardiomyopathy.  No s/s of ischemia. Mild troponin elevation is likely due to demand ischemia from volume overload.   - Continue statin - No ASA given stable CAD and apixaban use.  3. Atrial fibrillation: Persistent since  August 2017. He had been on amiodarone prior to admission.  We attempted DCCV this admission but this failed.  Amiodarone therefore stopped.  - Continue apixaban.    4. AKI on CKD stage 3: Creatinine stable at 2.15.  Follow closely with diuresis.  5. Deconditioning: Has been wheelchair-bound since he fractured his hip in the spring. PT following. Recommending HHPT.   6. Symptomatic bradycardia: Has St Jude dual chamber pacemaker.  With cardiomyopathy, would limit RV pacing. Back up rate decreased to 60 bpm this admit.  7. Leg ulcers: WOC following.  8. Acute on chronic hypoxemic respiratory failure: Wears 3L [place] at home.  However, currently requiring NRBM.  This may be due to pulmonary edema primarily.  I was concerned for PNA but WBCs and PCT are both low and he is afebrile.  CXR looks likely pulmonary edema but cannot rule out basilar PNA. Still wheezing on exam.  - He is on cefepime.  - Continue Duonebs, will give an additional dose of Solumedrol this am with ongoing wheezing (prior smoker but never told that he has COPD).  - Continue diuresis as above.   Length of Stay: 6  [name], MD  11/12/2016, 9:02 AM  Advanced Heart Failure Team Pager [phone] (M-F; 7a - 4p)  Please contact [facility] for night-coverage after hours (4p -7a ) and weekends on [email or web address]  Discussed with nursing,  patient probably was not getting Lasix yesterday, think IV was infiltrating into his arm.  IV access has been difficult to obtain.  - Will place PICC today, follow CVP via PICC.  - Will keep Lasix at 80 mg IV bid given concern that he did not actually get Lasix yesterday.   [name] 11/12/2016 9:14 AM  -

## 2016-11-12 NOTE — Progress Notes (Signed)
Patient took nap, when he awoke, was disoriented, states he feels like he is in a nightmare, something is wrong. Coughing more, not oriented to place, time or situation, but oriented to self. Reoriented, but patient states he doe snot believe me. He Called wife to come in, knew her name. Kept taking leads and oxygen off. Saturations drop down to 70;s when takes o2 off. Placed on HFNC at 12 L, he is keeping that in for now. Called MD regarding low urine output, increase in coughing, perhaps needing more diuresis.

## 2016-11-12 NOTE — Progress Notes (Signed)
Nutrition Follow-up  DOCUMENTATION CODES:   Not applicable  INTERVENTION:   -D/c Ensure Enlive po BID, each supplement provides 350 kcal and 20 grams of protein, due to poor acceptance -30 ml Prostat BID, each supplement provides 100 kcals and 15 grams of protein -Continue MVI daily  NUTRITION DIAGNOSIS:   Increased nutrient needs related to wound healing as evidenced by estimated needs.  Ongoing  GOAL:   Patient will meet greater than or equal to 90% of their needs  Progressing  MONITOR:   PO intake, Supplement acceptance  REASON FOR ASSESSMENT:   Consult  (New onset heart failure)  ASSESSMENT:   Pt with PMH of AAA, BPH, CKD Stage III, HTN, hypothyroidism, A fib since 09/2016 , R hip fracture, symptomatic bradycardia St jude dual chamber PPM , and chronic systolic heart failure admitted with worsening SOB, volume overload, severe deconditioning with acute CHF and possible PNA.   9/24- CWOCN consult revealed 3 full thickness wounds on lt calf 9/25- s/p cardioversion  Plan for PICC today (suspect IV was infiltrated, per MD notes).   Pt - 7 L since admission.   Spoke with pt and family members at bedside. All report that pt's appetite has improved since admission (PO: 25-100%)- pt consumed 100% of breakfast that consisted of toast with jelly, fresh fruit cup, and cereal. Wife at bedside reports that pt ate 3 donuts overnight (pt has been consuming some outside food brought in by family members). Pt does not like Ensure supplements- reports "it's no use bringing them up because I won't eat it if I don't like it". Reviewed low sodium diet again with pt and family, focusing on heart healthy-friendly snacks and meal items, as well as low sodium seasonings.   Labs reviewed: Na: 132.  Diet Order:  Diet Heart Room service appropriate? Yes; Fluid consistency: Thin  Skin:  Wound (see comment) (st 2 sacrum, 3 full thickness wound lt calf)  Last BM:  11/11/16  Height:   Ht  Readings from Last 1 Encounters:  11/07/16 5\' 10"  (1.778 m)    Weight:   Wt Readings from Last 1 Encounters:  11/12/16 190 lb (86.2 kg)    Ideal Body Weight:  75.4 kg  BMI:  Body mass index is 27.26 kg/m.  Estimated Nutritional Needs:   Kcal:  1700-1900  Protein:  100-120 grams  Fluid:  >1.5 L/day  EDUCATION NEEDS:   Education needs addressed  Dafne Nield A. Mayford Knife, RD, LDN, CDE Pager: [phone] After hours Pager: [phone]

## 2016-11-12 NOTE — Progress Notes (Signed)
Peripherally Inserted Central Catheter/Midline Placement  The IV Nurse has discussed with the patient and/or persons authorized to consent for the patient, the purpose of this procedure and the potential benefits and risks involved with this procedure.  The benefits include less needle sticks, lab draws from the catheter, and the patient may be discharged home with the catheter. Risks include, but not limited to, infection, bleeding, blood clot (thrombus formation), and puncture of an artery; nerve damage and irregular heartbeat and possibility to perform a PICC exchange if needed/ordered by physician.  Alternatives to this procedure were also discussed.  Bard Power PICC patient education guide, fact sheet on infection prevention and patient information card has been provided to patient /or left at bedside.    PICC/Midline Placement Documentation        Casmir Auguste, Lajean Manes 11/12/2016, 1:18 PM

## 2016-11-12 NOTE — Progress Notes (Signed)
Physical Therapy Treatment Patient Details Name: Connor Diaz MRN: [id number] DOB: Jul 13, 1923 Today's Date: 11/12/2016    History of Present Illness Mr Agrawal is a 83 year old with a history of AAA, BPH, CKD Stage III, DGD, HTN, hypothyroidism, A fib since 09/2016 , R hip fracture, symptomatic bradycardia St jude dual chamber PPM , and chronic systolic heart failure. Admitted with SOB and 20# weight gain.    PT Comments    Patient tolerated OOB transfer well with +2 assist and use of non-rebreather mask. Continue to progress as tolerated.   Follow Up Recommendations  Home health PT     Equipment Recommendations  None recommended by PT    Recommendations for Other Services       Precautions / Restrictions Precautions Precautions: Fall Precaution Comments: O2 3.5L @ home Restrictions Weight Bearing Restrictions: No    Mobility  Bed Mobility Overal bed mobility: Needs Assistance Bed Mobility: Supine to Sit     Supine to sit: Min assist     General bed mobility comments: assist to mobilize R LE and get to EOB and to elevate trunk into sitting; cues for sequencing and technique  Transfers Overall transfer level: Needs assistance Equipment used: 2 person hand held assist Transfers: Sit to/from Stand;Stand Pivot Transfers Sit to Stand: Min assist;+2 physical assistance;From elevated surface Stand pivot transfers: Mod assist;+2 physical assistance       General transfer comment: cues for technique; assist to power up into standing and for balance and weight shifting during pivot  Ambulation/Gait                 Stairs            Wheelchair Mobility    Modified Rankin (Stroke Patients Only)       Balance Overall balance assessment: Needs assistance   Sitting balance-Leahy Scale: Good     Standing balance support: Bilateral upper extremity supported Standing balance-Leahy Scale: Poor Standing balance comment: UE support for balance                             Cognition Arousal/Alertness: Awake/alert Behavior During Therapy: WFL for tasks assessed/performed Overall Cognitive Status: Within Functional Limits for tasks assessed                                        Exercises      General Comments General comments (skin integrity, edema, etc.): vitals WNL throughout session      Pertinent Vitals/Pain Pain Assessment: Faces Faces Pain Scale: Hurts little more Pain Location: R hip with mobility Pain Descriptors / Indicators: Aching Pain Intervention(s): Limited activity within patient's tolerance;Monitored during session;Repositioned    Home Living                      Prior Function            PT Goals (current goals can now be found in the care plan section) Acute Rehab PT Goals Patient Stated Goal: To go home PT Goal Formulation: With patient Time For Goal Achievement: 11/14/16 Potential to Achieve Goals: Good Progress towards PT goals: Progressing toward goals    Frequency    Min 3X/week      PT Plan Current plan remains appropriate    Co-evaluation  AM-PAC PT "6 Clicks" Daily Activity  Outcome Measure  Difficulty turning over in bed (including adjusting bedclothes, sheets and blankets)?: A Little Difficulty moving from lying on back to sitting on the side of the bed? : A Little Difficulty sitting down on and standing up from a chair with arms (e.g., wheelchair, bedside commode, etc,.)?: Unable Help needed moving to and from a bed to chair (including a wheelchair)?: A Little Help needed walking in hospital room?: A Little Help needed climbing 3-5 steps with a railing? : A Lot 6 Click Score: 15    End of Session Equipment Utilized During Treatment: Gait belt;Oxygen (non-rebreather) Activity Tolerance: Patient tolerated treatment well Patient left: in chair;with call bell/phone within reach;with family/visitor present Nurse  Communication: Mobility status PT Visit Diagnosis: Other abnormalities of gait and mobility (R26.89);Muscle weakness (generalized) (M62.81)     Time: 4098-1191 PT Time Calculation (min) (ACUTE ONLY): 23 min  Charges:  $Therapeutic Activity: 23-37 mins                    G Codes:       Erline Levine, PTA Pager: [phone]     Carolynne Edouard 11/12/2016, 4:40 PM

## 2016-11-12 NOTE — Progress Notes (Signed)
Patient awoke, talking around bipap, tool off to see if patient could tolerate. Placed on 12 L HFNC, but kept dropping saturations. Patient asking same thing over again, confused about time and situation, knows he is  In hospital, but unsure where. sompliant with safety, is not trying to get out of bed. Placed back on bipap.

## 2016-11-12 NOTE — Progress Notes (Signed)
Arrived at patient's room. Patient off bipap on a PNRB at 15L. Patient resting comfortably with rr at 19 and sp02 at 99%. Will wean oxygen as tolerated. Will continue to monitor. BIPAP on standby at bedside.

## 2016-11-12 NOTE — Plan of Care (Signed)
Problem: Physical Regulation: Goal: Ability to maintain clinical measurements within normal limits will improve Outcome: Not Progressing Oxygenation slightly worse, medicated with lasix to improve oxygenation. Moderate result.   Problem: Activity: Goal: Risk for activity intolerance will decrease Outcome: Not Progressing Short of breath at rest

## 2016-11-12 NOTE — Progress Notes (Signed)
PROGRESS NOTE    Connor Diaz  ZOX:[id number] DOB: 26-Dec-1923 DOA: 11/06/2016 PCP: Oval Linsey, MD   Brief Narrative:  83 y.o. WM PMHx Nonischemic Cardiomyopathy (S/P pacemaker placement last checked 09/2016), CHRONIC Systolic CHF (EF of 25-30%), Atrial Fibrillation, CAD, AAA, CKD stage III, HTN,  March 2018 sustained a hip fracture. Since then patient and family note that he has been "declining"with multiple intervening acute medical problems. Patient is presently wheelchair bound due to fatigue and debilitation. He notes that he has been having a slow progressive decrease in his exercise tolerance over the past several months which has increased over the past week. Patient notes that over the past week he has been short of breath even at rest which is unusual for him. He admits to orthopnea and PND at home. Patient has been managed by his PCP with Lasix 40 twice a day and has been told that the Lasix dose was conservative due to concerns about kidney function. Patient is also noted increasing abdominal girth and markedly lower extremity edema which has been treated with wrapping of legs.   This morning patient was markedly short of breath and gasping for air at rest. He had an outpatient M.D. appointment and was told to come to the ED from that appointment. Patient denies any acute chest or shoulder or jaw pain.   Patient denies fevers or chills. No malaise other than his acutely worsening shortness of breath. No sweats. Patient has baseline cough but that has not really changed from usual. No sputum production.    Subjective: 9/27 A/O 4, negative CP, positive acute or chronic SOB, negative N/V, negative abdominal pain,      Assessment & Plan:   Principal Problem:   Acute respiratory failure with hypoxia (HCC) Active Problems:   CAD (coronary artery disease)   Acute combined systolic and diastolic heart failure (HCC)   Paroxysmal atrial fibrillation (HCC)   Prostate  hypertrophy   HAP (hospital-acquired pneumonia)   Acute Respiratory Failure with Hypoxia -On admission SPO2= 64% on room air, placed on BiPAP -Very consistent with exacerbation of CHF.   Acute on Chronic systolic CHF  -Echocardiogram 8/11 LEFT ventricle: LVEF 25-30%.-Severe hypokinesis basal-mid inferior lateral myocardium. PA peak pressure = 39 mmHg    Recent Labs Lab 11/06/16 1106 11/06/16 1823 11/06/16 2202 11/07/16 0311  TROPONINI 0.03* <0.03 <0.03 <0.03  -Troponins negative -Coreg 6.25 mg BID -Lasix 80 mg BID -Strict in and out since admission -7.1 L -Daily weight Filed Weights   11/10/16 0353 11/11/16 0500 11/12/16 0500  Weight: 184 lb 1.4 oz (83.5 kg) 183 lb 6.8 oz (83.2 kg) 190 lb (86.2 kg)  -Echocardiogram: EF unchanged significantly increased pulmonary HTN see results below  -Continued fluid overload: 9/27 CVP= 15  AAA without rupture -Increasing in size patient family aware Has refused further intervention  Pulmonary HTN -See CHF  Chronic Atrial fibrillation(CHADSVASC score 5) -See CHF -Eliquis -9/25 S/P on successful DCCV -Currently in NSR  CKD stage III(Baseline Cr 1.3-1.5)   Recent Labs Lab 11/07/16 0311 11/08/16 0342 11/09/16 0229 11/10/16 0222 11/11/16 0252 11/12/16 0408  CREATININE 1.87* 1.99* 2.10* 2.14* 2.29* 2.15*   BPH -  Flomax 0.4 mg daily  HYPOTHYROIDISM -Synthroid 75 g daily   GERD Continue Pepcid    DVT prophylaxis: Eliquis Code Status: DO NOT RESUSCITATE Family Communication: None Disposition Plan: TBD   Consultants:  Cardiology    Procedures/Significant Events:  -9/22 Echocardiogram: LVEF= 25% to 30%.- Diffuse hypokinesis worse in the inferolateral and  anterolateral myocardium. -Left atrium: The appendage was moderately dilated. -Pulmonary arteries: PA peak pressure: 61 mm Hg (S). 9/25 DCCV: Unsuccessful    I have personally reviewed and interpreted all radiology studies and my findings are as  above.  VENTILATOR SETTINGS: None   Cultures 9/22 MRSA by PCR negative     Antimicrobials: Anti-infectives    Start     Dose/Rate Stop   11/07/16 1400  vancomycin (VANCOCIN) IVPB 1000 mg/200 mL premix     1,000 mg 200 mL/hr over 60 Minutes     11/07/16 1330  ceFEPIme (MAXIPIME) 1 g in dextrose 5 % 50 mL IVPB     1 g 100 mL/hr over 30 Minutes     11/06/16 1300  ceFEPIme (MAXIPIME) 2 g in dextrose 5 % 50 mL IVPB     2 g 100 mL/hr over 30 Minutes 11/06/16 1403   11/06/16 1300  vancomycin (VANCOCIN) 1,750 mg in sodium chloride 0.9 % 500 mL IVPB     1,750 mg 250 mL/hr over 120 Minutes 11/06/16 1830      Devices    LINES / TUBES:      Continuous Infusions: . ceFEPime (MAXIPIME) IV Stopped (11/11/16 1421)     Objective: Vitals:   11/12/16 0200 11/12/16 0336 11/12/16 0400 11/12/16 0500  BP: (!) 124/102 112/71 110/68   Pulse: 74 67 66   Resp: 20 (!) 21 (!) 22   Temp:  97.6 F (36.4 C)    TempSrc:  Oral    SpO2: 100% 98% 97%   Weight:    190 lb (86.2 kg)  Height:        Intake/Output Summary (Last 24 hours) at 11/12/16 0747 Last data filed at 11/12/16 0300  Gross per 24 hour  Intake              480 ml  Output             1175 ml  Net             -695 ml   Filed Weights   11/10/16 0353 11/11/16 0500 11/12/16 0500  Weight: 184 lb 1.4 oz (83.5 kg) 183 lb 6.8 oz (83.2 kg) 190 lb (86.2 kg)     Physical Exam:  General: A/O 4, positive acute on chronic respiratory distress Neck:  Negative scars, masses, torticollis, lymphadenopathy, JVD Lungs: positive diffuse crackles, decreased air movement,  Cardiovascular: Regular rate and rhythm without murmur gallop or rub normal S1 and S2 Abdomen: negative abdominal pain, positive distention, positive soft, bowel sounds, no rebound, no ascites, no appreciable mass Extremities: No significant cyanosis, clubbing, bilateral lower extremity pedal edema 1+, or edema bilateral lower extremities Skin: Bilateral lower  extremity venous stasis ulcers covered and with ace wraps n place. Psychiatric:  Negative depression, negative anxiety, negative fatigue, negative mania  Central nervous system:  Cranial nerves II through XII intact, tongue/uvula midline, all extremities muscle strength 5/5, sensation intact throughout,negative dysarthria, negative expressive aphasia, negative receptive aphasia.    .     Data Reviewed: Care during the described time interval was provided by me .  I have reviewed this patient's available data, including medical history, events of note, physical examination, and all test results as part of my evaluation.   CBC:  Recent Labs Lab 11/06/16 1106 11/07/16 0952 11/11/16 1030 11/12/16 0408  WBC 6.3 6.0 6.7 5.7  HGB 12.3* 11.6* 11.8* 11.4*  HCT 37.2* 36.3* 36.2* 35.0*  MCV 100.0 100.3* 101.1* 100.0  PLT  162 148* 170 166   Basic Metabolic Panel:  Recent Labs Lab 11/07/16 0952 11/08/16 0342 11/09/16 0229 11/10/16 0222 11/10/16 0800 11/11/16 0252 11/12/16 0408  NA  --  137 135 134*  --  134* 132*  K  --  3.8 3.9 4.0  --  4.0 4.8  CL  --  99* 98* 95*  --  97* 96*  CO2  --  31 30 32  --  29 27  GLUCOSE  --  95 124* 117*  --  99 196*  BUN  --  34* 39* 45*  --  44* 50*  CREATININE  --  1.99* 2.10* 2.14*  --  2.29* 2.15*  CALCIUM  --  8.6* 8.5* 8.5*  --  8.5* 8.5*  MG 1.9  --   --   --  2.2 2.1  --    GFR: Estimated Creatinine Clearance: 23.1 mL/min (A) (by C-G formula based on SCr of 2.15 mg/dL (H)). Liver Function Tests: No results for input(s): AST, ALT, ALKPHOS, BILITOT, PROT, ALBUMIN in the last 168 hours. No results for input(s): LIPASE, AMYLASE in the last 168 hours. No results for input(s): AMMONIA in the last 168 hours. Coagulation Profile: No results for input(s): INR, PROTIME in the last 168 hours. Cardiac Enzymes:  Recent Labs Lab 11/06/16 1106 11/06/16 1823 11/06/16 2202 11/07/16 0311  TROPONINI 0.03* <0.03 <0.03 <0.03   BNP (last 3  results) No results for input(s): PROBNP in the last 8760 hours. HbA1C: No results for input(s): HGBA1C in the last 72 hours. CBG: No results for input(s): GLUCAP in the last 168 hours. Lipid Profile: No results for input(s): CHOL, HDL, LDLCALC, TRIG, CHOLHDL, LDLDIRECT in the last 72 hours. Thyroid Function Tests: No results for input(s): TSH, T4TOTAL, FREET4, T3FREE, THYROIDAB in the last 72 hours. Anemia Panel: No results for input(s): VITAMINB12, FOLATE, FERRITIN, TIBC, IRON, RETICCTPCT in the last 72 hours. Urine analysis:    Component Value Date/Time   COLORURINE RED (A) 10/04/2016 1125   APPEARANCEUR TURBID (A) 10/04/2016 1125   LABSPEC  10/04/2016 1125    TEST NOT REPORTED DUE TO COLOR INTERFERENCE OF URINE PIGMENT   PHURINE  10/04/2016 1125    TEST NOT REPORTED DUE TO COLOR INTERFERENCE OF URINE PIGMENT   GLUCOSEU (A) 10/04/2016 1125    TEST NOT REPORTED DUE TO COLOR INTERFERENCE OF URINE PIGMENT   HGBUR (A) 10/04/2016 1125    TEST NOT REPORTED DUE TO COLOR INTERFERENCE OF URINE PIGMENT   BILIRUBINUR (A) 10/04/2016 1125    TEST NOT REPORTED DUE TO COLOR INTERFERENCE OF URINE PIGMENT   KETONESUR (A) 10/04/2016 1125    TEST NOT REPORTED DUE TO COLOR INTERFERENCE OF URINE PIGMENT   PROTEINUR (A) 10/04/2016 1125    TEST NOT REPORTED DUE TO COLOR INTERFERENCE OF URINE PIGMENT   NITRITE (A) 10/04/2016 1125    TEST NOT REPORTED DUE TO COLOR INTERFERENCE OF URINE PIGMENT   LEUKOCYTESUR (A) 10/04/2016 1125    TEST NOT REPORTED DUE TO COLOR INTERFERENCE OF URINE PIGMENT   Sepsis Labs: (procalcitonin:4,lacticidven:4)  ) Recent Results (from the past 240 hour(s))  MRSA PCR Screening     Status: None   Collection Time: 11/07/16  2:02 AM  Result Value Ref Range Status   MRSA by PCR NEGATIVE NEGATIVE Final    Comment:        The GeneXpert MRSA Assay (FDA approved for NASAL specimens only), is one component of a comprehensive MRSA colonization surveillance  program.  It is not intended to diagnose MRSA infection nor to guide or monitor treatment for MRSA infections.          Radiology Studies: Dg Chest Port 1 View  Result Date: 11/11/2016 CLINICAL DATA:  Shortness of Breath EXAM: PORTABLE CHEST 1 VIEW COMPARISON:  November 06, 2016 FINDINGS: There is diffuse interstitial edema with patchy alveolar consolidation in both lung bases. There are small pleural effusions bilaterally. There is cardiomegaly with mild pulmonary venous hypertension. There is aortic atherosclerosis. Pacemaker leads are attached to the right atrium and right ventricle. No adenopathy. There is calcification in the right carotid artery. There is superior migration of both humeral heads. IMPRESSION: Evidence of a degree of congestive heart failure, with slightly more edema compared to most recent study. Airspace consolidation in the bases is likely in part to alveolar edema, although pneumonia in the bases cannot be excluded radiographically. Both pneumonia and edema may be present concurrently. There is aortic atherosclerosis. There is right carotid artery calcification. Pacemaker leads attached to right atrium and right ventricle. There is superior migration of both humeral heads, findings indicative of rotator cuff tears bilaterally. Aortic Atherosclerosis (ICD10-I70.0). Electronically Signed   By: Bretta Bang III M.D.   On: 11/11/2016 10:53        Scheduled Meds: . apixaban  2.5 mg Oral BID  . atorvastatin  40 mg Oral q1800  . carvedilol  6.25 mg Oral BID WC  . famotidine  20 mg Oral QAC breakfast  . feeding supplement (ENSURE ENLIVE)  237 mL Oral BID BM  . furosemide  80 mg Intravenous BID  . ipratropium-albuterol  3 mL Nebulization QID  . levothyroxine  75 mcg Oral QAC breakfast  . mouth rinse  15 mL Mouth Rinse BID  . multivitamin with minerals  1 tablet Oral Daily  . omega-3 acid ethyl esters  1 g Oral Daily  . tamsulosin  0.4 mg Oral Daily    Continuous Infusions: . ceFEPime (MAXIPIME) IV Stopped (11/11/16 1421)     LOS: 6 days    Time spent: 40 minutes    WOODS, Roselind Messier, MD [facility] Pager [phone]   If 7PM-7AM, please contact night-coverage [email or web address] Password [facility] 11/12/2016, 7:47 AM

## 2016-11-13 LAB — BASIC METABOLIC PANEL
Anion gap: 8 (ref 5–15)
BUN: 58 mg/dL — AB (ref 6–20)
CHLORIDE: 96 mmol/L — AB (ref 101–111)
CO2: 31 mmol/L (ref 22–32)
Calcium: 8.7 mg/dL — ABNORMAL LOW (ref 8.9–10.3)
Creatinine, Ser: 2.12 mg/dL — ABNORMAL HIGH (ref 0.61–1.24)
GFR calc Af Amer: 30 mL/min — ABNORMAL LOW (ref >60)
GFR calc non Af Amer: 26 mL/min — ABNORMAL LOW (ref >60)
GLUCOSE: 156 mg/dL — AB (ref 65–99)
POTASSIUM: 4.6 mmol/L (ref 3.5–5.1)
Sodium: 135 mmol/L (ref 135–145)

## 2016-11-13 LAB — GLUCOSE, CAPILLARY: Glucose-Capillary: 150 mg/dL — ABNORMAL HIGH (ref 65–99)

## 2016-11-13 LAB — COOXEMETRY PANEL
Carboxyhemoglobin: 1.1 % (ref 0.5–1.5)
METHEMOGLOBIN: 0.9 % (ref 0.0–1.5)
O2 Saturation: 64.2 %
Total hemoglobin: 13.3 g/dL (ref 12.0–16.0)

## 2016-11-13 LAB — PROCALCITONIN

## 2016-11-13 MED ORDER — METOLAZONE 2.5 MG PO TABS
2.5000 mg | ORAL_TABLET | Freq: Once | ORAL | Status: AC
  Administered 2016-11-13: 2.5 mg via ORAL
  Filled 2016-11-13: qty 1

## 2016-11-13 MED ORDER — FUROSEMIDE 10 MG/ML IJ SOLN
120.0000 mg | Freq: Two times a day (BID) | INTRAMUSCULAR | Status: DC
  Administered 2016-11-13 – 2016-11-14 (×4): 120 mg via INTRAVENOUS
  Filled 2016-11-13: qty 12
  Filled 2016-11-13 (×2): qty 10
  Filled 2016-11-13 (×2): qty 12

## 2016-11-13 NOTE — Progress Notes (Signed)
[facility] TEAM 1 - Stepdown/ICU TEAM  Connor Diaz  FYT:[id number] DOB: 17-Apr-1923 DOA: 11/06/2016 PCP: Oval Linsey, MD    Brief Narrative:  83yo M w/ Hx Nonischemic Cardiomyopathy (S/P pacemaker placement last checked 09/2016), Chronic Systolic CHF (EF of 25-30%), Atrial Fibrillation, CAD, AAA, CKD stage III, HTN, and a hip fx in March 2018 after which he has been "declining" with multiple intervening acute medical problems who presented to the hospital w/ dyspnea.   In the ED the patient was noted to have bibasilar opacification right greater than left with concern for possible pneumonia.   Subjective: The patient appears much more comfortable today than when I saw him 48 hours ago.  He denies current shortness of breath nausea vomiting chest pain or abdominal pain.  Assessment & Plan:  Acute hypoxic respiratory failure  Wean O2 as able - ongoing attempts to diurese   Acute exacerbation of Chronic systolic congestive heart failure EF 25-30% - ischemic in etiology - care per CHF Team - escalating doses of lasix being required in attempt to diurese   Wheezing - Acute bronchospasm Now resolved   Possible PNA Given his acute decline abx was resumed empirically 9/26 - cont for a 5 day course and follow clinically   AAA  CAD - mild troponin elevation  Demand ischemia due to volume overload   Chronic persistent atrial fibrillation Chadsvasc is 5 - cont eliquis and amio - DCCV 9/25 was not successful - rate presently controlled    Acute kidney injury on CKD stage III Baseline creatinine 1.3-1.5 - currently holding steady w/ ongoing attempts to diurese though higher than baseline - follow   Recent Labs Lab 11/09/16 0229 11/10/16 0222 11/11/16 0252 11/12/16 0408 11/13/16 0800  CREATININE 2.10* 2.14* 2.29* 2.15* 2.12*   BPH Cont flomax  Hypothyroidism Cont home synthroid dose   L LE w/ 3 areas of full thickness wounds Care as per WOC Team   GERD  DVT  prophylaxis: eliquis Code Status: DNR - NO CODE Family Communication: spoke w/ wife at bedside   Disposition Plan: SDU  Consultants:  Cardiology   Procedures: TTE 9/22 EF 25-30% with diffuse hypokinesis   Antimicrobials:  Cefepime 9/21 > 9/23 + 9/26 > Vancomycin 9/21 > 9/23    Objective: Blood pressure 129/84, pulse 72, temperature (!) 97.4 F (36.3 C), temperature source Oral, resp. rate (!) 23, height 5\' 10"  (1.778 m), weight 86.6 kg (191 lb), SpO2 92 %.  Intake/Output Summary (Last 24 hours) at 11/13/16 1451 Last data filed at 11/13/16 1149  Gross per 24 hour  Intake              130 ml  Output             1325 ml  Net            -1195 ml   Filed Weights   11/11/16 0500 11/12/16 0500 11/13/16 0500  Weight: 83.2 kg (183 lb 6.8 oz) 86.2 kg (190 lb) 86.6 kg (191 lb)    Examination: General: no acute distress at time of visit today Lungs: fine diffuse crackles with no wheezing Cardiovascular: irregularly irregular with controlled rate and no rub Abdomen: Nontender, nondistended, soft Extremities: No significant edema bilateral lower extremities  CBC:  Recent Labs Lab 11/07/16 0952 11/11/16 1030 11/12/16 0408  WBC 6.0 6.7 5.7  HGB 11.6* 11.8* 11.4*  HCT 36.3* 36.2* 35.0*  MCV 100.3* 101.1* 100.0  PLT 148* 170 166   Basic Metabolic Panel:  Recent Labs Lab 11/07/16 0952  11/09/16 0229 11/10/16 0222 11/10/16 0800 11/11/16 0252 11/12/16 0408 11/13/16 0800  NA  --   < > 135 134*  --  134* 132* 135  K  --   < > 3.9 4.0  --  4.0 4.8 4.6  CL  --   < > 98* 95*  --  97* 96* 96*  CO2  --   < > 30 32  --  GLUCOSE  --   < > 124* 117*  --  99 196* 156*  BUN  --   < > 39* 45*  --  44* 50* 58*  CREATININE  --   < > 2.10* 2.14*  --  2.29* 2.15* 2.12*  CALCIUM  --   < > 8.5* 8.5*  --  8.5* 8.5* 8.7*  MG 1.9  --   --   --  2.2 2.1  --   --   < > = values in this interval not displayed. GFR: Estimated Creatinine Clearance: 23.4 mL/min (A) (by C-G formula  based on SCr of 2.12 mg/dL (H)).  Liver Function Tests: No results for input(s): AST, ALT, ALKPHOS, BILITOT, PROT, ALBUMIN in the last 168 hours. No results for input(s): LIPASE, AMYLASE in the last 168 hours. No results for input(s): AMMONIA in the last 168 hours.   Cardiac Enzymes:  Recent Labs Lab 11/06/16 1823 11/06/16 2202 11/07/16 0311  TROPONINI <0.03 <0.03 <0.03     Recent Results (from the past 240 hour(s))  MRSA PCR Screening     Status: None   Collection Time: 11/07/16  2:02 AM  Result Value Ref Range Status   MRSA by PCR NEGATIVE NEGATIVE Final    Comment:        The GeneXpert MRSA Assay (FDA approved for NASAL specimens only), is one component of a comprehensive MRSA colonization surveillance program. It is not intended to diagnose MRSA infection nor to guide or monitor treatment for MRSA infections.      Scheduled Meds: . apixaban  2.5 mg Oral BID  . atorvastatin  40 mg Oral q1800  . carvedilol  6.25 mg Oral BID WC  . famotidine  20 mg Oral QAC breakfast  . feeding supplement (PRO-STAT SUGAR FREE 64)  30 mL Oral BID  . ipratropium-albuterol  3 mL Nebulization QID  . levothyroxine  75 mcg Oral QAC breakfast  . mouth rinse  15 mL Mouth Rinse BID  . multivitamin with minerals  1 tablet Oral Daily  . omega-3 acid ethyl esters  1 g Oral Daily  . sodium chloride flush  10-40 mL Intracatheter Q12H  . tamsulosin  0.4 mg Oral Daily     LOS: 7 days   Lonia Blood, MD [facility] Office  [phone] Pager - Text Page per Amion as per below:  On-Call/Text Page:      [email or web address]      password [email or web address]  If 7PM-7AM, please contact night-coverage [email or web address] Password [email or web address] 11/13/2016, 2:51 PM

## 2016-11-13 NOTE — Plan of Care (Signed)
Problem: Physical Regulation: Goal: Ability to maintain clinical measurements within normal limits will improve Outcome: Progressing Oxygenation slightly improving

## 2016-11-13 NOTE — Progress Notes (Signed)
Physical Therapy Treatment Patient Details Name: Connor Diaz MRN: [id number] DOB: 1923-10-10 Today's Date: 11/13/2016    History of Present Illness Mr Heichel is a 83 year old with a history of AAA, BPH, CKD Stage III, DGD, HTN, hypothyroidism, A fib since 09/2016 , R hip fracture, symptomatic bradycardia St jude dual chamber PPM , and chronic systolic heart failure. Admitted with SOB and 20# weight gain.    PT Comments    Pt admitted with above diagnosis. Pt currently with functional limitations due to balance and endurance deficits. Pt was able to ambulate to hallway with +2 min to mod assist and chair follow by son.  Pt progressing well.  Did have to incr O2 to 15LHFNC to keep sats >88%.   Pt will benefit from skilled PT to increase their independence and safety with mobility to allow discharge to the venue listed below.     Follow Up Recommendations  Home health PT;Supervision/Assistance - 24 hour     Equipment Recommendations  None recommended by PT    Recommendations for Other Services       Precautions / Restrictions Precautions Precautions: Fall Precaution Comments: O2 3.5L @ home Restrictions Weight Bearing Restrictions: No    Mobility  Bed Mobility Overal bed mobility: Needs Assistance Bed Mobility: Supine to Sit     Supine to sit: Min assist     General bed mobility comments: assist to mobilize R LE and get to EOB and to elevate trunk into sitting; cues for sequencing and technique  Transfers Overall transfer level: Needs assistance Equipment used: Rolling walker (2 wheeled) Transfers: Sit to/from [employer] Sit to Stand: Min assist;+2 physical assistance;From elevated surface;Mod assist (son followed with chair)         General transfer comment: cues for technique; assist to power up into standing and for balance and weight shifting during pivot  Ambulation/Gait Ambulation/Gait assistance: Min assist;+2 physical assistance (3rd  person follow with chair) Ambulation Distance (Feet): 25 Feet Assistive device: Rolling walker (2 wheeled) Gait Pattern/deviations: Step-to pattern;Decreased stride length;Decreased step length - right;Decreased step length - left;Trunk flexed;Wide base of support   Gait velocity interpretation: Below normal speed for age/gender General Gait Details: limited due to pain R knee (noted genuvarus deformity bilateral).  Pt was able to take some steps and walk into hallway with RW.  Pt needed cues to stand tall.    Stairs            Wheelchair Mobility    Modified Rankin (Stroke Patients Only)       Balance Overall balance assessment: Needs assistance Sitting-balance support: No upper extremity supported;Feet supported Sitting balance-Leahy Scale: Fair     Standing balance support: Bilateral upper extremity supported Standing balance-Leahy Scale: Poor Standing balance comment: UE support for balance                            Cognition Arousal/Alertness: Awake/alert Behavior During Therapy: WFL for tasks assessed/performed Overall Cognitive Status: Within Functional Limits for tasks assessed                                 General Comments: hallucinating last night per pt      Exercises General Exercises - Lower Extremity Ankle Circles/Pumps: AROM;Both;10 reps;Supine Long Arc Quad: AROM;Both;10 reps;Seated Hip Flexion/Marching: AROM;Both;10 reps;Seated    General Comments General comments (skin integrity, edema, etc.): Pt  95% on 10LHFNC on arrival. Desat to 84% once moving therefore incr to 15LHFNC and sats 88-92%.  Placed pt back on 10LHFNC at end of treeatment with sats >90%.  BP 112/67.  Pt desat again with exercises and needed cues to perform 5 reps and then rest and then 5 reps.       Pertinent Vitals/Pain Pain Assessment: No/denies pain    Home Living                      Prior Function            PT Goals (current  goals can now be found in the care plan section) Progress towards PT goals: Progressing toward goals    Frequency    Min 3X/week      PT Plan Current plan remains appropriate    Co-evaluation              AM-PAC PT "6 Clicks" Daily Activity  Outcome Measure  Difficulty turning over in bed (including adjusting bedclothes, sheets and blankets)?: A Little Difficulty moving from lying on back to sitting on the side of the bed? : A Little Difficulty sitting down on and standing up from a chair with arms (e.g., wheelchair, bedside commode, etc,.)?: A Lot Help needed moving to and from a bed to chair (including a wheelchair)?: A Lot Help needed walking in hospital room?: A Lot Help needed climbing 3-5 steps with a railing? : A Lot 6 Click Score: 14    End of Session Equipment Utilized During Treatment: Gait belt;Oxygen (non-rebreather) Activity Tolerance: Patient tolerated treatment well Patient left: in chair;with call bell/phone within reach;with family/visitor present Nurse Communication: Mobility status PT Visit Diagnosis: Other abnormalities of gait and mobility (R26.89);Muscle weakness (generalized) (M62.81)     Time: 4401-0272 PT Time Calculation (min) (ACUTE ONLY): 28 min  Charges:  $Gait Training: 8-22 mins $Therapeutic Exercise: 8-22 mins                    G Codes:       [place],PT Acute Rehabilitation [phone] (pager)    Berline Lopes 11/13/2016, 12:40 PM

## 2016-11-13 NOTE — Progress Notes (Signed)
Family came in to spend some time with patient. patient still thinks he is in a dream, doesn't believe in hospital. Not attempting to get oob, once N/c HF in has not taken oxygen off. Back to eyes closed when family finished visiting.

## 2016-11-13 NOTE — Progress Notes (Signed)
Patient ID: Connor Diaz, male   DOB: 1923/07/17, 83 y.o.   MRN: [id number]     Advanced Heart Failure Rounding Note  Primary Cardiologist: Allred  Subjective:    Failed DCCV 11/10/16. Amiodarone stopped. Coreg increased.   PICC line placed yesterday. Initial CVP 16.   Feeling OK this am. Breathing improved. Denies SOB at present. Had some confusion overnight. Clear this am.   Creatinine 2.1 -> 2.29 -> 2.15 -> BMET pending today.  Negative 940 cc. Weight up 1 lb (Bed weight, Have asked nurse to re-weigh standing)  CVP 13-14  PCT < 0.01 but BNP 1134.    Echo (9/18): EF 25-30%, mild LVH, mildly dilated RV with normal systolic function, PASP 61 mmHg.   Objective:   Weight Range: 191 lb (86.6 kg) Body mass index is 27.41 kg/m.   Vital Signs:   Temp:  [97.5 F (36.4 C)-98.2 F (36.8 C)] 97.6 F (36.4 C) (09/28 0803) Pulse Rate:  [65-83] 72 (09/28 0803) Resp:  [16-22] 22 (09/28 0803) BP: (102-123)/(64-85) 112/67 (09/28 0803) SpO2:  [90 %-100 %] 90 % (09/28 0803) Weight:  [191 lb (86.6 kg)] 191 lb (86.6 kg) (09/28 0500) Last BM Date: 11/11/16  Weight change: Filed Weights   11/11/16 0500 11/12/16 0500 11/13/16 0500  Weight: 183 lb 6.8 oz (83.2 kg) 190 lb (86.2 kg) 191 lb (86.6 kg)    Intake/Output:   Intake/Output Summary (Last 24 hours) at 11/13/16 0816 Last data filed at 11/13/16 2952  Gross per 24 hour  Intake              360 ml  Output             1450 ml  Net            -1090 ml      Physical Exam    General: Elderly and chronically ill appearing. NAD.  HEENT: Normal Neck: Supple. JVP to jaw. Carotids 2+ bilat; no bruits. No thyromegaly or nodule noted. Cor: PMI nondisplaced. Irregularly irregular.  No M/G/R noted Lungs: Diminished breath sounds with coarse crackles throughout.  Abdomen: Soft, non-tender, non-distended, no HSM. No bruits or masses. +BS  Extremities: No cyanosis, clubbing, or rash. 1+ ankle edema. BLE wrapped.  Neuro: Alert &  orientedx3, cranial nerves grossly intact. moves all 4 extremities w/o difficulty. Affect pleasant   Telemetry   Afib with V pacing (Rate controlled), personally reviewed.   Labs    CBC  Recent Labs  11/11/16 1030 11/12/16 0408  WBC 6.7 5.7  HGB 11.8* 11.4*  HCT 36.2* 35.0*  MCV 101.1* 100.0  PLT 170 166   Basic Metabolic Panel  Recent Labs  11/11/16 0252 11/12/16 0408  NA 134* 132*  K 4.0 4.8  CL 97* 96*  CO2 29 27  GLUCOSE 99 196*  BUN 44* 50*  CREATININE 2.29* 2.15*  CALCIUM 8.5* 8.5*  MG 2.1  --    Liver Function Tests No results for input(s): AST, ALT, ALKPHOS, BILITOT, PROT, ALBUMIN in the last 72 hours. No results for input(s): LIPASE, AMYLASE in the last 72 hours. Cardiac Enzymes No results for input(s): CKTOTAL, CKMB, CKMBINDEX, TROPONINI in the last 72 hours.  BNP: BNP (last 3 results)  Recent Labs  05/21/16 1140 11/06/16 1106 11/11/16 1030  BNP 962.0* 940.4* 1,137.4*    ProBNP (last 3 results) No results for input(s): PROBNP in the last 8760 hours.  D-Dimer No results for input(s): DDIMER in the last 72 hours. Hemoglobin A1C  No results for input(s): HGBA1C in the last 72 hours. Fasting Lipid Panel No results for input(s): CHOL, HDL, LDLCALC, TRIG, CHOLHDL, LDLDIRECT in the last 72 hours. Thyroid Function Tests No results for input(s): TSH, T4TOTAL, T3FREE, THYROIDAB in the last 72 hours.  Invalid input(s): FREET3  Other results:  Imaging   Dg Chest Port 1 View  Result Date: 11/12/2016 CLINICAL DATA:  Central catheter placement EXAM: PORTABLE CHEST 1 VIEW COMPARISON:  November 11, 2016 FINDINGS: Central catheter tip is in the superior vena cava near the cavoatrial junction. Pacemaker leads are attached to the right atrium and right ventricle. No pneumothorax. There remains interstitial pulmonary edema with bibasilar alveolar consolidation. There are pleural effusions bilaterally, slightly better seen and felt to be slightly larger  than 1 day prior. There is cardiomegaly with mild pulmonary venous hypertension. There is aortic atherosclerosis. No evident adenopathy. Calcification is noted in the coronary arteries. Superior migration of both humeral heads noted. IMPRESSION: Central catheter tip in superior vena cava near cavoatrial junction. No pneumothorax. Changes of congestive heart failure with edema stable and slight increase in pleural effusions. Stable cardiac enlargement. Bibasilar alveolar consolidation may represent alveolar edema or superimposed pneumonia. Both entities may exist concurrently. There is aortic atherosclerosis. There is calcification in each carotid artery. Aortic Atherosclerosis (ICD10-I70.0). Electronically Signed   By: Bretta Bang III M.D.   On: 11/12/2016 13:54    Medications:     Scheduled Medications: . apixaban  2.5 mg Oral BID  . atorvastatin  40 mg Oral q1800  . carvedilol  6.25 mg Oral BID WC  . famotidine  20 mg Oral QAC breakfast  . feeding supplement (PRO-STAT SUGAR FREE 64)  30 mL Oral BID  . furosemide  80 mg Intravenous BID  . ipratropium-albuterol  3 mL Nebulization QID  . levothyroxine  75 mcg Oral QAC breakfast  . mouth rinse  15 mL Mouth Rinse BID  . multivitamin with minerals  1 tablet Oral Daily  . omega-3 acid ethyl esters  1 g Oral Daily  . sodium chloride flush  10-40 mL Intracatheter Q12H  . tamsulosin  0.4 mg Oral Daily    Infusions: . ceFEPime (MAXIPIME) IV Stopped (11/12/16 1616)    PRN Medications: acetaminophen, albuterol, LORazepam, morphine injection, nitroGLYCERIN, ondansetron (ZOFRAN) IV, sodium chloride flush  Patient Profile   Mr Crist is 83 year old with chronic systolic heart failure, A fib, and limited mobility.  Admitted with hypoxic respiratory failure and marked volume overload.   Assessment/Plan   1. Acute on chronic systolic CHF: Ischemic cardiomyopathy.  Echo (9/18) with EF 25-30% with D-shaped ventricle suggestive of RV  pressure/volume overload.  He is on home oxygen, 3 L by [place].  He has developed symptoms and lower extremity edema over the last 2-3 weeks prior to admission. He diuresed well initially but developed respiratory distress on 9/26 and IV diuresis restarted.  On exam today, I suspect that he is still volume overloaded.  Creatinine stable at 2.15.  He did not diurese vigorously yesterday. - Increase lasix to 120 mg IV BID today.   - Continue Coreg 6.25 mg BID.   - Hold off on ACEI/ARB/ARNI for now with elevated creatinine.  2. CAD: Ischemic cardiomyopathy.  No s/s of ischemia.    - Mild troponin elevation is likely due to demand ischemia from volume overload.   - Continue statin - No ASA given stable CAD and apixaban use.  3. Atrial fibrillation: Persistent since August 2017. He had been  on amiodarone prior to admission.  We attempted DCCV this admission but this failed.  Amiodarone therefore stopped.  - Continue apixaban.    4. AKI on CKD stage 3:  - BMET pending this am. Follow closely with diuresis.  5. Deconditioning: Has been wheelchair-bound since he fractured his hip in the spring. PT following. Recommending HHPT.   6. Symptomatic bradycardia: Has St Jude dual chamber pacemaker.  With cardiomyopathy, would limit RV pacing. Back up rate decreased to 60 bpm this admit.  No change. 7. Leg ulcers: WOC following.  8. Acute on chronic hypoxemic respiratory failure: Wears 3L [place] at home.  However, currently requiring NRBM.  This may be due to pulmonary edema primarily.  Initially concerned for PNA but WBCs and PCT are both low and he is afebrile.  CXR looks like pulmonary edema but cannot rule out basilar PNA.  - He is on cefepime.  - Continue Duonebs - Got an additional dose of Solumedrol yesterday.  - Continue diuresis as above.  9. Delirium - Resolved. Likely due to ICU/sundowning.  CVP remains elevated. Continue to diurese. Breathing improve. BMET and standing weight pending.   Length of Stay:  [address]  [place]ella [place], [place]  11/13/2016, 8:16 AM  Advanced Heart Failure Team Pager [phone] (M-F; 7a - 4p)  Please contact [facility] for night-coverage after hours (4p -7a ) and weekends on [email or web address]  Patient seen with PA, agree with the above note.  CVP remains high and he remains on more oxygen than his baseline.  Some diuresis yesterday but not marked.  Still awaiting today's BMET.  - Will increase Lasix to 120 mg IV bid, may need dose of metolazone but will wait for labs to determine this.  - Send co-ox to make sure cardiac output adequate.   I suspect that his main issue is CHF/pulmonary edema, less likely PNA.  He is being covered with antibiotics and getting Duonebs.   [name] 11/13/2016 9:21 AM

## 2016-11-13 NOTE — Progress Notes (Signed)
More lucid this Am wife resting at bedside, remembers that he was hallucinating and was not " in my right mind".

## 2016-11-14 LAB — BASIC METABOLIC PANEL
ANION GAP: 8 (ref 5–15)
BUN: 69 mg/dL — ABNORMAL HIGH (ref 6–20)
CALCIUM: 8.7 mg/dL — AB (ref 8.9–10.3)
CHLORIDE: 93 mmol/L — AB (ref 101–111)
CO2: 32 mmol/L (ref 22–32)
Creatinine, Ser: 2.24 mg/dL — ABNORMAL HIGH (ref 0.61–1.24)
GFR calc non Af Amer: 24 mL/min — ABNORMAL LOW (ref >60)
GFR, EST AFRICAN AMERICAN: 28 mL/min — AB (ref >60)
Glucose, Bld: 101 mg/dL — ABNORMAL HIGH (ref 65–99)
Potassium: 4.7 mmol/L (ref 3.5–5.1)
Sodium: 133 mmol/L — ABNORMAL LOW (ref 135–145)

## 2016-11-14 MED ORDER — DIPHENHYDRAMINE HCL 25 MG PO CAPS
25.0000 mg | ORAL_CAPSULE | Freq: Every evening | ORAL | Status: DC | PRN
  Administered 2016-11-14 – 2016-11-16 (×3): 25 mg via ORAL
  Filled 2016-11-14 (×3): qty 1

## 2016-11-14 MED ORDER — METOLAZONE 5 MG PO TABS
5.0000 mg | ORAL_TABLET | Freq: Once | ORAL | Status: AC
  Administered 2016-11-14: 5 mg via ORAL
  Filled 2016-11-14: qty 1

## 2016-11-14 NOTE — Plan of Care (Signed)
Problem: Physical Regulation: Goal: Ability to maintain clinical measurements within normal limits will improve Outcome: Progressing Weaning o2 down slowly increased dose of lasix

## 2016-11-14 NOTE — Progress Notes (Signed)
Patient ID: Connor Diaz, male   DOB: [birth year], 83 y.o.   MRN: [id number]     Advanced Heart Failure Rounding Note  Primary Cardiologist: Allred  Subjective:    Failed DCCV 11/10/16. Amiodarone stopped. Coreg increased.   PICC line placed 9/27. Initial CVP 16.   Feeling OK this am. Still SOB with wheezing. Feels he is peeing more.   Co-ox 64% CVP 13-14  PCT < 0.01 but BNP 1134.    Echo (9/18): EF 25-30%, mild LVH, mildly dilated RV with normal systolic function, PASP 61 mmHg.   Objective:   Weight Range: 89.8 kg (198 lb) Body mass index is 28.41 kg/m.   Vital Signs:   Temp:  [97.3 F (36.3 C)-98.1 F (36.7 C)] 97.3 F (36.3 C) (09/29 1146) Pulse Rate:  [65-83] 75 (09/29 1146) Resp:  [18-24] 24 (09/29 0800) BP: (112-128)/(75-91) 124/91 (09/29 1146) SpO2:  [79 %-97 %] 95 % (09/29 1200) Weight:  [89.8 kg (198 lb)] 89.8 kg (198 lb) (09/29 0400) Last BM Date: 11/12/16  Weight change: Filed Weights   11/12/16 0500 11/13/16 0500 11/14/16 0400  Weight: 86.2 kg (190 lb) 86.6 kg (191 lb) 89.8 kg (198 lb)    Intake/Output:   Intake/Output Summary (Last 24 hours) at 11/14/16 1313 Last data filed at 11/14/16 1225  Gross per 24 hour  Intake             1046 ml  Output             1725 ml  Net             -679 ml      Physical Exam    General: Elderly and chronically ill appearing. Sitting on side of bed  NAD.  HEENT: Normal Neck: Supple. JVP to jaw  Carotids 2+ bilat; no bruits. No thyromegaly or nodule noted. Cor: PMI nondisplaced. IRR IRR no murmur Lungs: Diminished breath sounds with crackles hroughout. + wheezing Abdomen: Soft, non-tender, non-distended, no HSM. No bruits or masses. +BS  Extremities: No cyanosis, clubbing, or rash. 1+ edema R>L .  Neuro: Alert & orientedx3, cranial nerves grossly intact. moves all 4 extremities w/o difficulty. Affect pleasant   Telemetry   Afib with V pacing 70s (Rate controlled), Personally reviewed   Labs     CBC  Recent Labs  11/12/16 0408  WBC 5.7  HGB 11.4*  HCT 35.0*  MCV 100.0  PLT 166   Basic Metabolic Panel  Recent Labs  11/13/16 0800 11/14/16 0451  NA 135 133*  K 4.6 4.7  CL 96* 93*  CO2 31 32  GLUCOSE 156* 101*  BUN 58* 69*  CREATININE 2.12* 2.24*  CALCIUM 8.7* 8.7*   Liver Function Tests No results for input(s): AST, ALT, ALKPHOS, BILITOT, PROT, ALBUMIN in the last 72 hours. No results for input(s): LIPASE, AMYLASE in the last 72 hours. Cardiac Enzymes No results for input(s): CKTOTAL, CKMB, CKMBINDEX, TROPONINI in the last 72 hours.  BNP: BNP (last 3 results)  Recent Labs  05/21/16 1140 11/06/16 1106 11/11/16 1030  BNP 962.0* 940.4* 1,137.4*    ProBNP (last 3 results) No results for input(s): PROBNP in the last 8760 hours.  D-Dimer No results for input(s): DDIMER in the last 72 hours. Hemoglobin A1C No results for input(s): HGBA1C in the last 72 hours. Fasting Lipid Panel No results for input(s): CHOL, HDL, LDLCALC, TRIG, CHOLHDL, LDLDIRECT in the last 72 hours. Thyroid Function Tests No results for input(s): TSH, T4TOTAL, T3FREE, THYROIDAB  in the last 72 hours.  Invalid input(s): FREET3  Other results:  Imaging   No results found.  Medications:     Scheduled Medications: . apixaban  2.5 mg Oral BID  . atorvastatin  40 mg Oral q1800  . carvedilol  6.25 mg Oral BID WC  . famotidine  20 mg Oral QAC breakfast  . feeding supplement (PRO-STAT SUGAR FREE 64)  30 mL Oral BID  . ipratropium-albuterol  3 mL Nebulization QID  . levothyroxine  75 mcg Oral QAC breakfast  . mouth rinse  15 mL Mouth Rinse BID  . multivitamin with minerals  1 tablet Oral Daily  . omega-3 acid ethyl esters  1 g Oral Daily  . sodium chloride flush  10-40 mL Intracatheter Q12H  . tamsulosin  0.4 mg Oral Daily    Infusions: . ceFEPime (MAXIPIME) IV 2 g (11/14/16 1225)  . furosemide Stopped (11/14/16 0950)    PRN Medications: acetaminophen, albuterol,  LORazepam, morphine injection, nitroGLYCERIN, ondansetron (ZOFRAN) IV, sodium chloride flush  Patient Profile   Connor Diaz is 83 year old with chronic systolic heart failure, A fib, and limited mobility.  Admitted with hypoxic respiratory failure and marked volume overload.   Assessment/Plan   1. Acute on chronic systolic CHF: Ischemic cardiomyopathy.  Echo (9/18) with EF 25-30% with D-shaped ventricle suggestive of RV pressure/volume overload.  He is on home oxygen, 3 L by [place].  He has developed symptoms and lower extremity edema over the last 2-3 weeks prior to admission. He diuresed well initially but developed respiratory distress on 9/26 and IV diuresis restarted.   - Weight recorded as up 7 pounds. Doubt accurate as I/Os are somewhat improved. CVP still 13-14 though  - Remains on lasix 120IV bid. Will continue. Add metolazone  daily.  - Creatinine relatively stable  - Continue Coreg 6.25 mg BID.   - Hold off on ACEI/ARB/ARNI for now with elevated creatinine.  2. CAD: Ischemic cardiomyopathy.  No s/s of ischemia.    - Mild troponin elevation is likely due to demand ischemia from volume overload.   - Continue statin - No ASA given stable CAD and apixaban use.  3. Atrial fibrillation: Persistent since August 2017. He had been on amiodarone prior to admission.  We attempted DCCV this admission but this failed.  Amiodarone therefore stopped.  - Continue apixaban.    4. AKI on CKD stage 3:  - Stable today creatinine 2.1-> 2.2 5. Deconditioning: Has been wheelchair-bound since he fractured his hip in the spring. PT following. Recommending HHPT.   6. Symptomatic bradycardia: Has St Jude dual chamber pacemaker.  With cardiomyopathy, would limit RV pacing. Back up rate decreased to 60 bpm this admit.  No change. 7. Leg ulcers: WOC following.  8. Acute on chronic hypoxemic respiratory failure: Wears 3L [place] at home.  However, currently requiring NRBM.  This may be due to pulmonary edema  primarily.  Initially concerned for PNA but WBCs and PCT are both low and he is afebrile.  CXR looks like pulmonary edema but cannot rule out basilar PNA.  - He is on cefepime.  - Continue Duonebs - Got an additional dose of Solumedrol 9/27 - Continue diuresis as above.  9. Delirium - Resolved. Likely due to ICU/sundowning.  Not making much progress. He remains DNR/DNI. Consider comfort care.    Length of Stay: 8  Arvilla Meres, MD  11/14/2016, 1:13 PM  Advanced Heart Failure Team Pager [phone] (M-F; 7a - 4p)  Please contact  [facility] for night-coverage after hours (4p -7a ) and weekends on [email or web address]

## 2016-11-14 NOTE — Progress Notes (Signed)
[facility] TEAM 1 - Stepdown/ICU TEAM  Connor Diaz  ZOX:[id number] DOB: 07-Oct-1923 DOA: 11/06/2016 PCP: Oval Linsey, MD    Brief Narrative:  83yo M w/ Hx Nonischemic Cardiomyopathy (S/P pacemaker placement last checked 09/2016), Chronic Systolic CHF (EF of 25-30%), Atrial Fibrillation, CAD, AAA, CKD stage III, HTN, and a hip fx in March 2018 after which he has been "declining" with multiple intervening acute medical problems who presented to the hospital w/ dyspnea.   In the ED the patient was noted to have bibasilar opacification right greater than left with concern for possible pneumonia.   Subjective: The patient had an episode today during which she felt very short of breath but this appears to have improved following a neb tx and a Zaroxolyn dose.  He presently denies severe shortness of breath, nausea vomiting, or chest pain.  Assessment & Plan:  Acute hypoxic respiratory failure  Wean O2 as able - ongoing attempts to diurese are proving difficult  Acute exacerbation of Chronic systolic congestive heart failure EF 25-30% - ischemic in etiology - care per CHF Team - escalating doses of lasix being required in attempt to diurese - zaroxolyn added   Wheezing - Acute bronchospasm Now resolved   Possible PNA Given his acute decline abx was resumed empirically 9/26 - cont for a 5 day course through 9/30  AAA  CAD - mild troponin elevation  Demand ischemia due to volume overload - no chest pain   Chronic persistent atrial fibrillation Chadsvasc is 5 - cont eliquis - DCCV 9/25 was not successful - rate presently controlled - amio stopped by Cards   Acute kidney injury on CKD stage III Baseline creatinine 1.3-1.5 - currently holding relatively steady w/ ongoing attempts to diurese though higher than baseline - follow trend   Recent Labs Lab 11/10/16 0222 11/11/16 0252 11/12/16 0408 11/13/16 0800 11/14/16 0451  CREATININE 2.14* 2.29* 2.15* 2.12* 2.24*   BPH Cont  flomax  Hypothyroidism Cont home synthroid dose   L LE w/ 3 areas of full thickness wounds Care as per WOC Team   GERD  DVT prophylaxis: eliquis Code Status: DNR - NO CODE Family Communication: spoke w/ wife and other extended family at bedside   Disposition Plan: SDU  Consultants:  Cardiology   Procedures: TTE 9/22 EF 25-30% with diffuse hypokinesis   Antimicrobials:  Cefepime 9/21 > 9/23 + 9/26 > Vancomycin 9/21 > 9/23    Objective: Blood pressure (!) 124/91, pulse 75, temperature (!) 97.3 F (36.3 C), temperature source Axillary, resp. rate (!) 24, height  (1.778 m), weight 89.8 kg (198 lb), SpO2 95 %.  Intake/Output Summary (Last 24 hours) at 11/14/16 1532 Last data filed at 11/14/16 1225  Gross per 24 hour  Intake             1046 ml  Output             1475 ml  Net             -429 ml   Filed Weights   11/12/16 0500 11/13/16 0500 11/14/16 0400  Weight: 86.2 kg (190 lb) 86.6 kg (191 lb) 89.8 kg (198 lb)    Examination: General: resting comfortably in chair at time of visit today Lungs: fine diffuse crackles persist without significant change - no wheezing Cardiovascular: irregularly irregular with controlled rate and no rub Abdomen: Nontender, nondistended, soft, BS+ Extremities: 2+ pitting edema right lower extremity and trace edema left lower extremity - 1+ edema left  upper extremity  CBC:  Recent Labs Lab 11/11/16 1030 11/12/16 0408  WBC 6.7 5.7  HGB 11.8* 11.4*  HCT 36.2* 35.0*  MCV 101.1* 100.0  PLT 170 166   Basic Metabolic Panel:  Recent Labs Lab 11/10/16 0222 11/10/16 0800 11/11/16 0252 11/12/16 0408 11/13/16 0800 11/14/16 0451  NA 134*  --  134* 132* 135 133*  K 4.0  --  4.0 4.8 4.6 4.7  CL 95*  --  97* 96* 96* 93*  CO2 32  --  29 27 31  32  GLUCOSE 117*  --  99 196* 156* 101*  BUN 45*  --  44* 50* 58* 69*  CREATININE 2.14*  --  2.29* 2.15* 2.12* 2.24*  CALCIUM 8.5*  --  8.5* 8.5* 8.7* 8.7*  MG  --  2.2 2.1  --   --   --     GFR: Estimated Creatinine Clearance: 24.2 mL/min (A) (by C-G formula based on SCr of 2.24 mg/dL (H)).  Liver Function Tests: No results for input(s): AST, ALT, ALKPHOS, BILITOT, PROT, ALBUMIN in the last 168 hours. No results for input(s): LIPASE, AMYLASE in the last 168 hours. No results for input(s): AMMONIA in the last 168 hours.   Recent Results (from the past 240 hour(s))  MRSA PCR Screening     Status: None   Collection Time: 11/07/16  2:02 AM  Result Value Ref Range Status   MRSA by PCR NEGATIVE NEGATIVE Final    Comment:        The GeneXpert MRSA Assay (FDA approved for NASAL specimens only), is one component of a comprehensive MRSA colonization surveillance program. It is not intended to diagnose MRSA infection nor to guide or monitor treatment for MRSA infections.      Scheduled Meds: . apixaban  2.5 mg Oral BID  . atorvastatin  40 mg Oral q1800  . carvedilol  6.25 mg Oral BID WC  . famotidine  20 mg Oral QAC breakfast  . feeding supplement (PRO-STAT SUGAR FREE 64)  30 mL Oral BID  . ipratropium-albuterol  3 mL Nebulization QID  . levothyroxine  75 mcg Oral QAC breakfast  . mouth rinse  15 mL Mouth Rinse BID  . multivitamin with minerals  1 tablet Oral Daily  . omega-3 acid ethyl esters  1 g Oral Daily  . sodium chloride flush  10-40 mL Intracatheter Q12H  . tamsulosin  0.4 mg Oral Daily     LOS: 8 days   Lonia Blood, MD [facility] Office  [phone] Pager - Text Page per Amion as per below:  On-Call/Text Page:      [email or web address]      password [email or web address]  If 7PM-7AM, please contact night-coverage [email or web address] Password [facility] 11/14/2016, 3:32 PM

## 2016-11-14 NOTE — Progress Notes (Signed)
Slept periodically throughout the night, restless at times, less confusion than the night previously. Wife at bedside. C/o some itching, hand, back and scalp area, reddened areas no hives or rash noted. These areas faded after skin moisturizer.

## 2016-11-15 LAB — BASIC METABOLIC PANEL
ANION GAP: 8 (ref 5–15)
BUN: 83 mg/dL — ABNORMAL HIGH (ref 6–20)
CHLORIDE: 93 mmol/L — AB (ref 101–111)
CO2: 33 mmol/L — ABNORMAL HIGH (ref 22–32)
Calcium: 8.6 mg/dL — ABNORMAL LOW (ref 8.9–10.3)
Creatinine, Ser: 2.49 mg/dL — ABNORMAL HIGH (ref 0.61–1.24)
GFR calc Af Amer: 24 mL/min — ABNORMAL LOW (ref >60)
GFR, EST NON AFRICAN AMERICAN: 21 mL/min — AB (ref >60)
GLUCOSE: 92 mg/dL (ref 65–99)
POTASSIUM: 4 mmol/L (ref 3.5–5.1)
SODIUM: 134 mmol/L — AB (ref 135–145)

## 2016-11-15 LAB — COOXEMETRY PANEL
CARBOXYHEMOGLOBIN: 1.4 % (ref 0.5–1.5)
METHEMOGLOBIN: 1.1 % (ref 0.0–1.5)
O2 Saturation: 81.9 %
TOTAL HEMOGLOBIN: 11 g/dL — AB (ref 12.0–16.0)

## 2016-11-15 MED ORDER — BUDESONIDE 0.25 MG/2ML IN SUSP
0.2500 mg | Freq: Two times a day (BID) | RESPIRATORY_TRACT | Status: DC
  Administered 2016-11-15 – 2016-11-20 (×11): 0.25 mg via RESPIRATORY_TRACT
  Filled 2016-11-15 (×11): qty 2

## 2016-11-15 MED ORDER — METOLAZONE 2.5 MG PO TABS
2.5000 mg | ORAL_TABLET | Freq: Once | ORAL | Status: AC
  Administered 2016-11-15: 2.5 mg via ORAL
  Filled 2016-11-15: qty 1

## 2016-11-15 MED ORDER — SENNOSIDES-DOCUSATE SODIUM 8.6-50 MG PO TABS
1.0000 | ORAL_TABLET | Freq: Two times a day (BID) | ORAL | Status: DC
  Administered 2016-11-15 – 2016-11-20 (×11): 1 via ORAL
  Filled 2016-11-15 (×11): qty 1

## 2016-11-15 MED ORDER — DEXTROSE 5 % IV SOLN
15.0000 mg/h | INTRAVENOUS | Status: DC
  Administered 2016-11-15 – 2016-11-17 (×3): 15 mg/h via INTRAVENOUS
  Filled 2016-11-15 (×3): qty 25
  Filled 2016-11-15: qty 21
  Filled 2016-11-15: qty 25

## 2016-11-15 MED ORDER — POLYETHYLENE GLYCOL 3350 17 G PO PACK
17.0000 g | PACK | Freq: Every day | ORAL | Status: DC
  Administered 2016-11-16 – 2016-11-20 (×5): 17 g via ORAL
  Filled 2016-11-15 (×5): qty 1

## 2016-11-15 MED ORDER — FUROSEMIDE 10 MG/ML IJ SOLN
80.0000 mg | Freq: Once | INTRAMUSCULAR | Status: AC
  Administered 2016-11-15: 80 mg via INTRAVENOUS
  Filled 2016-11-15: qty 8

## 2016-11-15 NOTE — Progress Notes (Signed)
Patient ID: Connor Diaz, male   DOB: 09/24/1923, 83 y.o.   MRN: [id number]     Advanced Heart Failure Rounding Note  Primary Cardiologist: Allred  Subjective:    Failed DCCV 11/10/16. Amiodarone stopped. Coreg increased.   PICC line placed 9/27. Initial CVP 16.  He remains on 8 L high flow [place].   Short of breath last night.  Did not diurese much yesterday.  CVP today is 18-19.  Co-ox 82%.  Creatinine up to 2.49.    PCT < 0.01 but BNP 1134.    Echo (9/18): EF 25-30%, mild LVH, mildly dilated RV with normal systolic function, PASP 61 mmHg.   Objective:   Weight Range: 194 lb (88 kg) Body mass index is 27.84 kg/m.   Vital Signs:   Temp:  [97.3 F (36.3 C)-97.8 F (36.6 C)] 97.8 F (36.6 C) (09/30 0409) Pulse Rate:  [69-75] 72 (09/30 0729) Resp:  [17-22] 17 (09/30 0729) BP: (122-126)/(77-91) 122/82 (09/30 0729) SpO2:  [93 %-97 %] 96 % (09/30 0808) Weight:  [194 lb (88 kg)] 194 lb (88 kg) (09/30 0624) Last BM Date: 11/12/16  Weight change: Filed Weights   11/13/16 0500 11/14/16 0400 11/15/16 0624  Weight: 191 lb (86.6 kg) 198 lb (89.8 kg) 194 lb (88 kg)    Intake/Output:   Intake/Output Summary (Last 24 hours) at 11/15/16 0919 Last data filed at 11/15/16 0409  Gross per 24 hour  Intake              472 ml  Output             1975 ml  Net            -1503 ml      Physical Exam    General: NAD Neck: JVP 14-16 cm, no thyromegaly or thyroid nodule.  Lungs: Wheezing with bibasilar crackles. CV: Nondisplaced PMI.  Heart irregular S1/S2, no S3/S4, no murmur. 1+ edema 1/2 to knees R>L.   Abdomen: Soft, nontender, no hepatosplenomegaly, no distention.  Skin: Intact without lesions or rashes.  Neurologic: Alert and oriented x 3.  Psych: Normal affect. Extremities: No clubbing or cyanosis.  HEENT: Normal.    Telemetry   Afib with occasional v-pacing in 70s (Rate controlled), Personally reviewed   Labs    CBC No results for input(s): WBC, NEUTROABS, HGB,  HCT, MCV, PLT in the last 72 hours. Basic Metabolic Panel  Recent Labs  11/14/16 0451 11/15/16 0530  NA 133* 134*  K 4.7 4.0  CL 93* 93*  CO2 32 33*  GLUCOSE 101* 92  BUN 69* 83*  CREATININE 2.24* 2.49*  CALCIUM 8.7* 8.6*   Liver Function Tests No results for input(s): AST, ALT, ALKPHOS, BILITOT, PROT, ALBUMIN in the last 72 hours. No results for input(s): LIPASE, AMYLASE in the last 72 hours. Cardiac Enzymes No results for input(s): CKTOTAL, CKMB, CKMBINDEX, TROPONINI in the last 72 hours.  BNP: BNP (last 3 results)  Recent Labs  05/21/16 1140 11/06/16 1106 11/11/16 1030  BNP 962.0* 940.4* 1,137.4*    ProBNP (last 3 results) No results for input(s): PROBNP in the last 8760 hours.  D-Dimer No results for input(s): DDIMER in the last 72 hours. Hemoglobin A1C No results for input(s): HGBA1C in the last 72 hours. Fasting Lipid Panel No results for input(s): CHOL, HDL, LDLCALC, TRIG, CHOLHDL, LDLDIRECT in the last 72 hours. Thyroid Function Tests No results for input(s): TSH, T4TOTAL, T3FREE, THYROIDAB in the last 72 hours.  Invalid input(s):  FREET3  Other results:  Imaging   No results found.  Medications:     Scheduled Medications: . apixaban  2.5 mg Oral BID  . atorvastatin  40 mg Oral q1800  . carvedilol  6.25 mg Oral BID WC  . famotidine  20 mg Oral QAC breakfast  . feeding supplement (PRO-STAT SUGAR FREE 64)  30 mL Oral BID  . furosemide  80 mg Intravenous Once  . ipratropium-albuterol  3 mL Nebulization QID  . levothyroxine  75 mcg Oral QAC breakfast  . mouth rinse  15 mL Mouth Rinse BID  . metolazone  2.5 mg Oral Once  . multivitamin with minerals  1 tablet Oral Daily  . omega-3 acid ethyl esters  1 g Oral Daily  . sodium chloride flush  10-40 mL Intracatheter Q12H  . tamsulosin  0.4 mg Oral Daily    Infusions: . ceFEPime (MAXIPIME) IV Stopped (11/14/16 1325)  . furosemide (LASIX) infusion      PRN Medications: acetaminophen,  albuterol, diphenhydrAMINE, LORazepam, morphine injection, nitroGLYCERIN, ondansetron (ZOFRAN) IV, sodium chloride flush  Patient Profile   Mr Mane is 83 year old with chronic systolic heart failure, A fib, and limited mobility.  Admitted with hypoxic respiratory failure and marked volume overload.   Assessment/Plan   1. Acute on chronic systolic CHF: Ischemic cardiomyopathy.  Echo (9/18) with EF 25-30% with D-shaped ventricle suggestive of RV pressure/volume overload.  He is on home oxygen, 3 L by [place].  He has developed symptoms and lower extremity edema over the last 2-3 weeks prior to admission. He diuresed well initially but developed respiratory distress on 9/26 and IV diuresis restarted.  He remains dyspneic at times and still on 8 L HFNC (on 2-3 L [place] at home), CVP still quite high at 18-19 today (personally checked).  We are not making much progress with diuresis and BUN/creatinine slowly rising.   - Will try Lasix infusion today with metolazone dose rather than Lasix boluses. - Continue Coreg 6.25 mg BID.   - Hold off on ACEI/ARB/ARNI for now with elevated creatinine.  2. CAD: Ischemic cardiomyopathy.  No s/s of ischemia.    - Mild troponin elevation was likely due to demand ischemia from volume overload.   - Continue statin - No ASA given stable CAD and apixaban use.  3. Atrial fibrillation: Persistent since August 2017. He had been on amiodarone prior to admission.  We attempted DCCV this admission but this failed.  Amiodarone therefore stopped.  - Continue apixaban.    4. AKI on CKD stage 3: Creatinine up to 2.49, slowly rising.  This will likely limit our ability to diurese him. 5. Deconditioning: Has been wheelchair-bound since he fractured his hip in the spring. PT following. Recommending HHPT.   6. Symptomatic bradycardia: Has St Jude dual chamber pacemaker.  With cardiomyopathy, would limit RV pacing. Back up rate decreased to 60 bpm this admit.  No change. 7. Leg ulcers:  WOC following.  8. Acute on chronic hypoxemic respiratory failure: Wears 3L [place] at home.  However, currently requiring 8 L HFNC.  This may be due to pulmonary edema primarily.  Initially concerned for PNA but WBCs and PCT are both low and he is afebrile.  CXR looks like pulmonary edema but cannot rule out basilar PNA.  - He is on cefepime => last day today.  - Continue Duonebs - Got an additional dose of Solumedrol 9/27 - Continue diuresis as above.  9. Delirium: Resolved. Likely due to ICU/sundowning.  Not making much progress. He remains DNR/DNI. If poor diuresis again today and rising creatinine, consider comfort care.   Length of Stay: 9  [name], MD  11/15/2016, 9:19 AM  Advanced Heart Failure Team Pager [phone] (M-F; 7a - 4p)  Please contact [facility] for night-coverage after hours (4p -7a ) and weekends on [email or web address]

## 2016-11-15 NOTE — Progress Notes (Signed)
Spouse and children at bedside. They requested a meeting with palliative care. Dr Sharon Seller notified and order placed for palliative consult. Spoke with Sarah,NP with palliative and family meeting scheduled for 1pm 11/16/16

## 2016-11-15 NOTE — Progress Notes (Signed)
Pt has bilateral hearing aids that were placed in ears by spouse this am

## 2016-11-15 NOTE — Progress Notes (Signed)
Pt states he feels he is sob. Pt on 8 liters high flow with O2 sats at 95%. Pt received breathing treatment from respiratory therapist. Attempted to get pt up to chair but he refuses at this time stating he does not feel well. Pt's spouse left earlier in the am to go to urgent care with her son because she stated she did not feel well and was wheezing. Pt is upset that spouse is not at bedside.

## 2016-11-15 NOTE — Progress Notes (Signed)
[facility] TEAM 1 - Stepdown/ICU TEAM  Connor Diaz  VFM:[id number] DOB: 01-May-1923 DOA: 11/06/2016 PCP: Oval Linsey, MD    Brief Narrative:  83yo M w/ Hx Nonischemic Cardiomyopathy (S/P pacemaker placement last checked 09/2016), Chronic Systolic CHF (EF of 25-30%), Atrial Fibrillation, CAD, AAA, CKD stage III, HTN, and a hip fx in March 2018 after which he has been "declining" with multiple intervening acute medical problems who presented to the hospital w/ dyspnea.   In the ED the patient was noted to have bibasilar opacification right greater than left with concern for possible pneumonia.   Subjective: The patient is calm and alert.  He appears somewhat dyspneic but denies shortness of breath.  He denies chest pain nausea vomiting or abdominal pain.  Assessment & Plan:  Acute hypoxic respiratory failure  Wean O2 as able - ongoing attempts to diurese are proving difficult and creat is climbing   Acute exacerbation of Chronic systolic congestive heart failure EF 25-30% - ischemic in etiology - care per CHF Team - escalating doses of lasix being required in attempt to diurese w/ change to lasix gtt today - zaroxolyn added - may soon reach the point at which tx proves futile/ineffective  Wheezing - Acute bronchospasm resolved w/ nebs and once dose of steroid  Possible PNA Given his acute decline abx was resumed empirically 9/26 - cont for a 5 day course through 9/30  AAA  CAD - mild troponin elevation  Demand ischemia due to volume overload - no chest pain   Chronic persistent atrial fibrillation Chadsvasc is 5 - cont eliquis - DCCV 9/25 was not successful - rate presently controlled - amio stopped by Cards   Acute kidney injury on CKD stage III Baseline creatinine 1.3-1.5 - crt is climbing despite effective diuresis - this may soon prove to be the rate limiting step in our diuretic efforts   Recent Labs Lab 11/11/16 0252 11/12/16 0408 11/13/16 0800 11/14/16 0451  11/15/16 0530  CREATININE 2.29* 2.15* 2.12* 2.24* 2.49*   BPH Cont flomax  Hypothyroidism Cont home synthroid dose   L LE w/ 3 areas of full thickness wounds Care as per WOC Team   GERD  DVT prophylaxis: eliquis Code Status: DNR - NO CODE Family Communication: no family present at time of exam today   Disposition Plan: SDU  Consultants:  Cardiology   Procedures: TTE 9/22 EF 25-30% with diffuse hypokinesis   Antimicrobials:  Cefepime 9/21 > 9/23 + 9/26 > Vancomycin 9/21 > 9/23    Objective: Blood pressure 122/82, pulse 72, temperature (!) 97.3 F (36.3 C), temperature source Oral, resp. rate 17, height 5\' 10"  (1.778 m), weight 88 kg (194 lb), SpO2 96 %.  Intake/Output Summary (Last 24 hours) at 11/15/16 1108 Last data filed at 11/15/16 1000  Gross per 24 hour  Intake              592 ml  Output             2475 ml  Net            -1883 ml   Filed Weights   11/13/16 0500 11/14/16 0400 11/15/16 0624  Weight: 86.6 kg (191 lb) 89.8 kg (198 lb) 88 kg (194 lb)    Examination: General: mildly dyspneic to exam without acute distress Lungs: diffuse crackles with mild intermittent expiratory wheezing Cardiovascular: irregularly irregular with controlled rate Abdomen: Nontender, nondistended, soft, BS+ Extremities: 2+ pitting edema right lower extremity and trace edema left lower  extremity - trace edema left upper extremity  CBC:  Recent Labs Lab 11/11/16 1030 11/12/16 0408  WBC 6.7 5.7  HGB 11.8* 11.4*  HCT 36.2* 35.0*  MCV 101.1* 100.0  PLT 170 166   Basic Metabolic Panel:  Recent Labs Lab 11/10/16 0800 11/11/16 0252 11/12/16 0408 11/13/16 0800 11/14/16 0451 11/15/16 0530  NA  --  134* 132* 135 133* 134*  K  --  4.0 4.8 4.6 4.7 4.0  CL  --  97* 96* 96* 93* 93*  CO2  --  32 33*  GLUCOSE  --  99 196* 156* 101* 92  BUN  --  44* 50* 58* 69* 83*  CREATININE  --  2.29* 2.15* 2.12* 2.24* 2.49*  CALCIUM  --  8.5* 8.5* 8.7* 8.7* 8.6*  MG 2.2  2.1  --   --   --   --    GFR: Estimated Creatinine Clearance: 21.6 mL/min (A) (by C-G formula based on SCr of 2.49 mg/dL (H)).  Liver Function Tests: No results for input(s): AST, ALT, ALKPHOS, BILITOT, PROT, ALBUMIN in the last 168 hours. No results for input(s): LIPASE, AMYLASE in the last 168 hours. No results for input(s): AMMONIA in the last 168 hours.   Recent Results (from the past 240 hour(s))  MRSA PCR Screening     Status: None   Collection Time: 11/07/16  2:02 AM  Result Value Ref Range Status   MRSA by PCR NEGATIVE NEGATIVE Final    Comment:        The GeneXpert MRSA Assay (FDA approved for NASAL specimens only), is one component of a comprehensive MRSA colonization surveillance program. It is not intended to diagnose MRSA infection nor to guide or monitor treatment for MRSA infections.      Scheduled Meds: . apixaban  2.5 mg Oral BID  . atorvastatin  40 mg Oral q1800  . carvedilol  6.25 mg Oral BID WC  . famotidine  20 mg Oral QAC breakfast  . feeding supplement (PRO-STAT SUGAR FREE 64)  30 mL Oral BID  . ipratropium-albuterol  3 mL Nebulization QID  . levothyroxine  75 mcg Oral QAC breakfast  . mouth rinse  15 mL Mouth Rinse BID  . multivitamin with minerals  1 tablet Oral Daily  . omega-3 acid ethyl esters  1 g Oral Daily  . sodium chloride flush  10-40 mL Intracatheter Q12H  . tamsulosin  0.4 mg Oral Daily     LOS: 9 days   Lonia Blood, MD [facility] Office  [phone] Pager - Text Page per Amion as per below:  On-Call/Text Page:      [email or web address]      password [email or web address]  If 7PM-7AM, please contact night-coverage [email or web address] Password [email or web address] 11/15/2016, 11:08 AM

## 2016-11-16 ENCOUNTER — Inpatient Hospital Stay (HOSPITAL_COMMUNITY): Payer: Medicare Other

## 2016-11-16 LAB — BASIC METABOLIC PANEL
ANION GAP: 9 (ref 5–15)
BUN: 88 mg/dL — ABNORMAL HIGH (ref 6–20)
CALCIUM: 8.8 mg/dL — AB (ref 8.9–10.3)
CO2: 33 mmol/L — ABNORMAL HIGH (ref 22–32)
CREATININE: 2.56 mg/dL — AB (ref 0.61–1.24)
Chloride: 90 mmol/L — ABNORMAL LOW (ref 101–111)
GFR, EST AFRICAN AMERICAN: 24 mL/min — AB (ref >60)
GFR, EST NON AFRICAN AMERICAN: 20 mL/min — AB (ref >60)
Glucose, Bld: 79 mg/dL (ref 65–99)
Potassium: 3.9 mmol/L (ref 3.5–5.1)
SODIUM: 132 mmol/L — AB (ref 135–145)

## 2016-11-16 LAB — COOXEMETRY PANEL
Carboxyhemoglobin: 1.8 % — ABNORMAL HIGH (ref 0.5–1.5)
Methemoglobin: 0.9 % (ref 0.0–1.5)
O2 Saturation: 82.6 %
Total hemoglobin: 11.2 g/dL — ABNORMAL LOW (ref 12.0–16.0)

## 2016-11-16 LAB — CBC
HCT: 32.8 % — ABNORMAL LOW (ref 39.0–52.0)
HEMOGLOBIN: 11 g/dL — AB (ref 13.0–17.0)
MCH: 33.1 pg (ref 26.0–34.0)
MCHC: 33.5 g/dL (ref 30.0–36.0)
MCV: 98.8 fL (ref 78.0–100.0)
PLATELETS: 165 10*3/uL (ref 150–400)
RBC: 3.32 MIL/uL — AB (ref 4.22–5.81)
RDW: 17.4 % — ABNORMAL HIGH (ref 11.5–15.5)
WBC: 7.9 10*3/uL (ref 4.0–10.5)

## 2016-11-16 MED ORDER — HYDRALAZINE HCL 10 MG PO TABS
10.0000 mg | ORAL_TABLET | Freq: Three times a day (TID) | ORAL | Status: DC
  Administered 2016-11-16 – 2016-11-18 (×9): 10 mg via ORAL
  Filled 2016-11-16 (×9): qty 1

## 2016-11-16 MED ORDER — SODIUM CHLORIDE 0.9% FLUSH: 10.0000 mL | INTRAVENOUS | Status: DC | PRN

## 2016-11-16 MED ORDER — SODIUM CHLORIDE 0.9% FLUSH
10.0000 mL | Freq: Two times a day (BID) | INTRAVENOUS | Status: DC
  Administered 2016-11-16: 10 mL

## 2016-11-16 MED ORDER — METOLAZONE 2.5 MG PO TABS
2.5000 mg | ORAL_TABLET | Freq: Once | ORAL | Status: AC
  Administered 2016-11-16: 2.5 mg via ORAL
  Filled 2016-11-16: qty 1

## 2016-11-16 MED ORDER — POTASSIUM CHLORIDE CRYS ER 20 MEQ PO TBCR
40.0000 meq | EXTENDED_RELEASE_TABLET | Freq: Once | ORAL | Status: AC
  Administered 2016-11-16: 40 meq via ORAL
  Filled 2016-11-16: qty 2

## 2016-11-16 MED ORDER — ISOSORBIDE MONONITRATE ER 30 MG PO TB24
15.0000 mg | ORAL_TABLET | Freq: Every day | ORAL | Status: DC
  Administered 2016-11-16 – 2016-11-20 (×5): 15 mg via ORAL
  Filled 2016-11-16 (×5): qty 1

## 2016-11-16 MED ORDER — IPRATROPIUM-ALBUTEROL 0.5-2.5 (3) MG/3ML IN SOLN
3.0000 mL | Freq: Three times a day (TID) | RESPIRATORY_TRACT | Status: DC
  Administered 2016-11-16 – 2016-11-20 (×13): 3 mL via RESPIRATORY_TRACT
  Filled 2016-11-16 (×13): qty 3

## 2016-11-16 NOTE — Progress Notes (Signed)
   11/16/16 0900  Clinical Encounter Type  Visited With Patient and family together  Visit Type Initial  Spiritual Encounters  Spiritual Needs Emotional;Prayer  Stress Factors  Patient Stress Factors Health changes  Family Stress Factors Health changes   Encountered spouse of patient in the hall.  Visited with spouse and then patient and son.  Provided comport and spiritual support as well as prayer.  Will follow as needed. Chaplain Agustin Cree

## 2016-11-16 NOTE — Progress Notes (Signed)
Patient ID: LARRIE FRAIZER, male   DOB: 01/21/1924, 83 y.o.   MRN: [id number]     Advanced Heart Failure Rounding Note  Primary Cardiologist: Allred  Subjective:    Failed DCCV 11/10/16. Amiodarone stopped. Coreg increased.   PICC line placed 9/27. Initial CVP 16.    Continues to diurese with  lasix drip 15 mg and metolazone. Weight down 4 pounds.   Feeling better. Denies SOB. No CP.  Alert but not oriented to time.   PCT < 0.01 but BNP 1134.    Echo (9/18): EF 25-30%, mild LVH, mildly dilated RV with normal systolic function, PASP 61 mmHg.   Objective:   Weight Range: 194 lb (88 kg) Body mass index is 27.84 kg/m.   Vital Signs:   Temp:  [97.3 F (36.3 C)-97.8 F (36.6 C)] 97.8 F (36.6 C) (10/01 0715) Pulse Rate:  [62-77] 62 (10/01 0715) Resp:  [13-19] 19 (10/01 0715) BP: (110-124)/(64-77) 121/77 (10/01 0715) SpO2:  [91 %-96 %] 91 % (10/01 0715) Last BM Date: 11/12/16  Weight change: Filed Weights   11/13/16 0500 11/14/16 0400 11/15/16 0624  Weight: 191 lb (86.6 kg) 198 lb (89.8 kg) 194 lb (88 kg)    Intake/Output:   Intake/Output Summary (Last 24 hours) at 11/16/16 0752 Last data filed at 11/16/16 0716  Gross per 24 hour  Intake              644 ml  Output             3900 ml  Net            -3256 ml      Physical Exam   CVP ~10-11 General:  Elderly.  No resp difficulty HEENT: normal Neck: supple. JVP 10-11. Carotids 2+ bilat; no bruits. No lymphadenopathy or thryomegaly appreciated. Cor: PMI nondisplaced. Irregular rate & rhythm. No rubs, gallops or murmurs. Lungs: Soft wheezes on 10 liters Abdomen: soft, nontender, distended. No hepatosplenomegaly. No bruits or masses. Good bowel sounds. Extremities: no cyanosis, clubbing, rash,  R>L edema. RLE 1+ edema. RUE PICC Neuro: alert but not oriented to time/date, cranial nerves grossly intact. moves all 4 extremities w/o difficulty. Affect pleasant  Telemetry   A Fib 70s. Personally reviewed.     Labs    CBC  Recent Labs  11/16/16 0630  WBC 7.9  HGB 11.0*  HCT 32.8*  MCV 98.8  PLT 165   Basic Metabolic Panel  Recent Labs  11/15/16 0530 11/16/16 0630  NA 134* 132*  K 4.0 3.9  CL 93* 90*  CO2 33* 33*  GLUCOSE 92 79  BUN 83* 88*  CREATININE 2.49* 2.56*  CALCIUM 8.6* 8.8*   Liver Function Tests No results for input(s): AST, ALT, ALKPHOS, BILITOT, PROT, ALBUMIN in the last 72 hours. No results for input(s): LIPASE, AMYLASE in the last 72 hours. Cardiac Enzymes No results for input(s): CKTOTAL, CKMB, CKMBINDEX, TROPONINI in the last 72 hours.  BNP: BNP (last 3 results)  Recent Labs  05/21/16 1140 11/06/16 1106 11/11/16 1030  BNP 962.0* 940.4* 1,137.4*    ProBNP (last 3 results) No results for input(s): PROBNP in the last 8760 hours.  D-Dimer No results for input(s): DDIMER in the last 72 hours. Hemoglobin A1C No results for input(s): HGBA1C in the last 72 hours. Fasting Lipid Panel No results for input(s): CHOL, HDL, LDLCALC, TRIG, CHOLHDL, LDLDIRECT in the last 72 hours. Thyroid Function Tests No results for input(s): TSH, T4TOTAL, T3FREE, THYROIDAB in the last  72 hours.  Invalid input(s): FREET3  Other results:  Imaging   No results found.  Medications:     Scheduled Medications: . apixaban  2.5 mg Oral BID  . atorvastatin  40 mg Oral q1800  . budesonide (PULMICORT) nebulizer solution  0.25 mg Nebulization BID  . carvedilol  6.25 mg Oral BID WC  . famotidine  20 mg Oral QAC breakfast  . feeding supplement (PRO-STAT SUGAR FREE 64)  30 mL Oral BID  . ipratropium-albuterol  3 mL Nebulization QID  . levothyroxine  75 mcg Oral QAC breakfast  . mouth rinse  15 mL Mouth Rinse BID  . multivitamin with minerals  1 tablet Oral Daily  . omega-3 acid ethyl esters  1 g Oral Daily  . polyethylene glycol  17 g Oral Daily  . senna-docusate  1 tablet Oral BID  . sodium chloride flush  10-40 mL Intracatheter Q12H  . tamsulosin  0.4 mg Oral  Daily    Infusions: . furosemide (LASIX) infusion 15 mg/hr (11/16/16 0600)    PRN Medications: acetaminophen, albuterol, diphenhydrAMINE, LORazepam, morphine injection, nitroGLYCERIN, ondansetron (ZOFRAN) IV, sodium chloride flush  Patient Profile   Mr Sibal is 83 year old with chronic systolic heart failure, A fib, and limited mobility.  Admitted with hypoxic respiratory failure and marked volume overload.   Assessment/Plan   1. Acute on chronic systolic CHF: Ischemic cardiomyopathy.  Echo (9/18) with EF 25-30% with D-shaped ventricle suggestive of RV pressure/volume overload.  He is on home oxygen, 3 L by [place].  He has developed symptoms and lower extremity edema over the last 2-3 weeks prior to admission. He diuresed well initially but developed respiratory distress on 9/26 and IV diuresis restarted.   - Volume status improving. CVP down to 10-11. One more dose of lasix drip 15 mg per hour + 2.5 mg metolazone. Anticipate switching to torsemide 80 mg daily tomorrow. Prefer once day dosing for home diuretic.   - Continue Coreg 6.25 mg BID.   - Hold off on ACEI/ARB/ARNI for now with elevated creatinine. Add hydralazine 10 mg three times a day + 15 mg imdur daily.   2. CAD: Ischemic cardiomyopathy.  No s/s of ischemia.    - Mild troponin elevation was likely due to demand ischemia from volume overload.   - No S/S ischemia. Continue statin - No ASA given stable CAD and apixaban use.  3. Atrial fibrillation: Persistent since August 2017. He had been on amiodarone prior to admission.  We attempted DCCV this admission but this failed.  Amiodarone therefore stopped.  - Continue apixaban 2.5 mg twice a day.    4. AKI on CKD stage 3:  Creatinine rising 2.5. Repeat BMET in am.  5. Deconditioning: Has been wheelchair-bound since he fractured his hip in the spring. PT following. Recommending HHPT.   6. Symptomatic bradycardia: Has [employer] Jude dual chamber pacemaker.  With cardiomyopathy, would limit  RV pacing. Back up rate decreased to 60 bpm this admit.  No change. 7. Leg ulcers: WOC following.  8. Acute on chronic hypoxemic respiratory failure: Wears 3L  at home.  However, currently requiring 10 L HFNC.  This may be due to pulmonary edema primarily.  Initially concerned for PNA but WBCs and PCT are both low and he is afebrile.  CXR looks like pulmonary edema but cannot rule out basilar PNA.  Still has some wheezing on exam.  - He has completed a 5 day course of cefepime.  - Continue Duonebs - Got  an additional dose of Solumedrol 9/27 - On 10 liters oxygen => try to titrate down today.  9. Delirium: Improved but still not oriented to time/date. Likely due to ICU/sundowning. 10. DNR/DNI   Length of Stay: 10  Tonye Becket, NP  11/16/2016, 7:52 AM  Advanced Heart Failure Team Pager [phone] (M-F; 7a - 4p)  Please contact [facility] for night-coverage after hours (4p -7a ) and weekends on [email or web address]  Patient seen with NP, agree with the above note.  He diuresed better yesterday, weight down 4 lbs and CVP 10-11.  Still on 10 L O2 HFNC.  Mild confusion (does not know the day). Creatinine slowly trending up.  - Would continue Lasix gtt + metolazone 2.5 for 1 more day. Transition to torsemide 80 mg daily tomorrow.  - Agree with initiation of low dose hydralazine/nitrates for afterload reduction.   Still some wheezing on exam.  Has completed course of antibiotics.  Continue Duonebs.  Will repeat CXR today.   Agree with palliative care consultation.    [name] 11/16/2016 8:19 AM

## 2016-11-16 NOTE — Progress Notes (Signed)
[place] TEAM 1 - Stepdown/ICU TEAM  Connor Diaz  ZOX:[id number] DOB: 05/06/1923 DOA: 11/06/2016 PCP: Oval Linsey, MD    Brief Narrative:  83yo M w/ Hx Nonischemic Cardiomyopathy (S/P pacemaker placement last checked 09/2016), Chronic Systolic CHF (EF of 25-30%), Atrial Fibrillation, CAD, AAA, CKD stage III, HTN, and a hip fx in March 2018 after which he has been "declining" with multiple intervening acute medical problems who presented to the hospital w/ dyspnea.   In the ED the patient was noted to have bibasilar opacification right greater than left with concern for possible pneumonia.   Subjective: The patient says he slept great last night and feels much better today.  He is much more alert and interactive though mildly confused.  There is no evidence of acute respiratory distress at this time.  He denies cp or abdom pain.    Assessment & Plan:  Acute hypoxic respiratory failure  Wean O2 as able - appears to be having increased response to lasix gtt, but creat is still climbing - remains fragile   Acute exacerbation of Chronic systolic congestive heart failure EF 25-30% - ischemic in etiology - care per CHF Team - escalating doses of lasix being required in attempt to diurese w/ change to lasix gtt - zaroxolyn added - net negative ~13.5L since admit - may soon reach the point at which tx proves futile/ineffective, but has definitely made progress on lasix gtt over last 24hrs  Filed Weights   11/13/16 0500 11/14/16 0400 11/15/16 0624  Weight: 86.6 kg (191 lb) 89.8 kg (198 lb) 88 kg (194 lb)    Acute kidney injury on CKD stage III Baseline creatinine 1.3-1.5 - crt is climbing w/ diuresis - this may soon prove to be the rate limiting step in our diuretic efforts - discussed w/ family   Recent Labs Lab 11/12/16 0408 11/13/16 0800 11/14/16 0451 11/15/16 0530 11/16/16 0630  CREATININE 2.15* 2.12* 2.24* 2.49* 2.56*    Possible PNA Given his acute decline abx was  resumed empirically 9/26 - has completed a 5 day tx course - no current evidence of infection   AAA  CAD - mild troponin elevation  Demand ischemia due to volume overload - no chest pain   Chronic persistent atrial fibrillation Chadsvasc is 5 - cont eliquis - DCCV 9/25 was not successful - rate presently controlled - amio stopped by Cards   BPH Cont flomax  Hypothyroidism Cont home synthroid dose   L LE w/ 3 areas of full thickness wounds Care as per WOC Team   GERD  DVT prophylaxis: eliquis Code Status: DNR - NO CODE Family Communication: spoke with the patient's wife and son at bedside and in the hallway Disposition Plan: SDU - cont active care for now - determine if can be sent home w/ hospice assist   Consultants:  Cardiology  Palliative Care   Procedures: TTE 9/22 EF 25-30% with diffuse hypokinesis   Antimicrobials:  Cefepime 9/21 > 9/23 + 9/26 > 9/30 Vancomycin 9/21 > 9/23    Objective: Blood pressure 121/77, pulse 69, temperature 97.8 F (36.6 C), temperature source Oral, resp. rate 19, height  (1.778 m), weight 88 kg (194 lb), SpO2 95 %.  Intake/Output Summary (Last 24 hours) at 11/16/16 0909 Last data filed at 11/16/16 0716  Gross per 24 hour  Intake              644 ml  Output  3400 ml  Net            -2756 ml   Filed Weights   11/13/16 0500 11/14/16 0400 11/15/16 0624  Weight: 86.6 kg (191 lb) 89.8 kg (198 lb) 88 kg (194 lb)    Examination: General: resting comfortably at time of exam today Lungs: fine crackles diffusely but improved air movement with no wheezing Cardiovascular: irregularly irregular - rate controlled - no M or rub  Abdomen: Nontender, nondistended, soft, BS+ Extremities: no upper extrem edema - 1+ B LE edema   CBC:  Recent Labs Lab 11/11/16 1030 11/12/16 0408 11/16/16 0630  WBC 6.7 5.7 7.9  HGB 11.8* 11.4* 11.0*  HCT 36.2* 35.0* 32.8*  MCV 101.1* 100.0 98.8  PLT 170 166 165   Basic Metabolic  Panel:  Recent Labs Lab 11/10/16 0800 11/11/16 0252 11/12/16 0408 11/13/16 0800 11/14/16 0451 11/15/16 0530 11/16/16 0630  NA  --  134* 132* 135 133* 134* 132*  K  --  4.0 4.8 4.6 4.7 4.0 3.9  CL  --  97* 96* 96* 93* 93* 90*  CO2  --  29 27 31  32 33* 33*  GLUCOSE  --  99 196* 156* 101* 92 79  BUN  --  44* 50* 58* 69* 83* 88*  CREATININE  --  2.29* 2.15* 2.12* 2.24* 2.49* 2.56*  CALCIUM  --  8.5* 8.5* 8.7* 8.7* 8.6* 8.8*  MG 2.2 2.1  --   --   --   --   --    GFR: Estimated Creatinine Clearance: 21 mL/min (A) (by C-G formula based on SCr of 2.56 mg/dL (H)).  Liver Function Tests: No results for input(s): AST, ALT, ALKPHOS, BILITOT, PROT, ALBUMIN in the last 168 hours. No results for input(s): LIPASE, AMYLASE in the last 168 hours. No results for input(s): AMMONIA in the last 168 hours.   Recent Results (from the past 240 hour(s))  MRSA PCR Screening     Status: None   Collection Time: 11/07/16  2:02 AM  Result Value Ref Range Status   MRSA by PCR NEGATIVE NEGATIVE Final    Comment:        The GeneXpert MRSA Assay (FDA approved for NASAL specimens only), is one component of a comprehensive MRSA colonization surveillance program. It is not intended to diagnose MRSA infection nor to guide or monitor treatment for MRSA infections.      Scheduled Meds: . apixaban  2.5 mg Oral BID  . atorvastatin  40 mg Oral q1800  . budesonide (PULMICORT) nebulizer solution  0.25 mg Nebulization BID  . carvedilol  6.25 mg Oral BID WC  . famotidine  20 mg Oral QAC breakfast  . feeding supplement (PRO-STAT SUGAR FREE 64)  30 mL Oral BID  . hydrALAZINE  10 mg Oral TID  . ipratropium-albuterol  3 mL Nebulization QID  . isosorbide mononitrate  15 mg Oral Daily  . levothyroxine  75 mcg Oral QAC breakfast  . mouth rinse  15 mL Mouth Rinse BID  . multivitamin with minerals  1 tablet Oral Daily  . omega-3 acid ethyl esters  1 g Oral Daily  . polyethylene glycol  17 g Oral Daily  .  potassium chloride  40 mEq Oral Once  . senna-docusate  1 tablet Oral BID  . sodium chloride flush  10-40 mL Intracatheter Q12H  . tamsulosin  0.4 mg Oral Daily     LOS: 10 days   Lonia Blood, MD [facility] Office  [phone] Pager - Text Page per Loretha Stapler as per below:  On-Call/Text Page:      [email or web address]      password [email or web address]  If 7PM-7AM, please contact night-coverage [email or web address] Password [email or web address] 11/16/2016, 9:09 AM

## 2016-11-17 LAB — CBC
HEMATOCRIT: 33.8 % — AB (ref 39.0–52.0)
Hemoglobin: 11.2 g/dL — ABNORMAL LOW (ref 13.0–17.0)
MCH: 32.7 pg (ref 26.0–34.0)
MCHC: 33.1 g/dL (ref 30.0–36.0)
MCV: 98.5 fL (ref 78.0–100.0)
Platelets: 159 10*3/uL (ref 150–400)
RBC: 3.43 MIL/uL — ABNORMAL LOW (ref 4.22–5.81)
RDW: 17 % — AB (ref 11.5–15.5)
WBC: 8.9 10*3/uL (ref 4.0–10.5)

## 2016-11-17 LAB — BASIC METABOLIC PANEL
ANION GAP: 10 (ref 5–15)
BUN: 98 mg/dL — AB (ref 6–20)
CHLORIDE: 86 mmol/L — AB (ref 101–111)
CO2: 35 mmol/L — ABNORMAL HIGH (ref 22–32)
Calcium: 8.7 mg/dL — ABNORMAL LOW (ref 8.9–10.3)
Creatinine, Ser: 2.67 mg/dL — ABNORMAL HIGH (ref 0.61–1.24)
GFR calc Af Amer: 22 mL/min — ABNORMAL LOW (ref >60)
GFR, EST NON AFRICAN AMERICAN: 19 mL/min — AB (ref >60)
GLUCOSE: 143 mg/dL — AB (ref 65–99)
POTASSIUM: 4.1 mmol/L (ref 3.5–5.1)
SODIUM: 131 mmol/L — AB (ref 135–145)

## 2016-11-17 LAB — COOXEMETRY PANEL
CARBOXYHEMOGLOBIN: 1.7 % — AB (ref 0.5–1.5)
METHEMOGLOBIN: 0.9 % (ref 0.0–1.5)
O2 Saturation: 56.3 %
TOTAL HEMOGLOBIN: 11.4 g/dL — AB (ref 12.0–16.0)

## 2016-11-17 LAB — GLUCOSE, CAPILLARY
GLUCOSE-CAPILLARY: 105 mg/dL — AB (ref 65–99)
Glucose-Capillary: 123 mg/dL — ABNORMAL HIGH (ref 65–99)
Glucose-Capillary: 96 mg/dL (ref 65–99)

## 2016-11-17 MED ORDER — LORAZEPAM 2 MG/ML IJ SOLN: 0.5000 mg | Freq: Four times a day (QID) | INTRAMUSCULAR | Status: DC | PRN

## 2016-11-17 MED ORDER — MORPHINE SULFATE (PF) 2 MG/ML IV SOLN
1.0000 mg | INTRAVENOUS | Status: DC | PRN
  Administered 2016-11-18 – 2016-11-20 (×2): 2 mg via INTRAVENOUS
  Filled 2016-11-17 (×2): qty 1

## 2016-11-17 MED ORDER — TRAZODONE HCL 50 MG PO TABS
50.0000 mg | ORAL_TABLET | Freq: Every evening | ORAL | Status: DC | PRN
  Administered 2016-11-18: 50 mg via ORAL
  Filled 2016-11-17: qty 1

## 2016-11-17 MED ORDER — LORAZEPAM 2 MG/ML IJ SOLN
0.5000 mg | Freq: Three times a day (TID) | INTRAMUSCULAR | Status: DC | PRN
  Administered 2016-11-20: 0.5 mg via INTRAVENOUS
  Filled 2016-11-17: qty 1

## 2016-11-17 MED ORDER — TORSEMIDE 20 MG PO TABS
80.0000 mg | ORAL_TABLET | Freq: Every day | ORAL | Status: DC
  Administered 2016-11-17: 80 mg via ORAL
  Filled 2016-11-17 (×2): qty 4

## 2016-11-17 NOTE — Progress Notes (Signed)
PT Cancellation Note  Patient Details Name: Connor Diaz MRN: [id number] DOB: 1923/04/08   Cancelled Treatment:    Reason Eval/Treat Not Completed: Other (comment) (Pts family in room including wife that is a pt in 2C as well)   Berline Lopes 11/17/2016, 11:05 AM [facility] [phone] (pager)

## 2016-11-17 NOTE — Progress Notes (Addendum)
Patient ID: Connor Diaz, male   DOB: Sep 11, 1923, 83 y.o.   MRN: [id number]     Advanced Heart Failure Rounding Note  Primary Cardiologist: Connor Diaz  Subjective:    Failed DCCV 11/10/16. Amiodarone stopped. Coreg increased.   PICC line placed 9/27. Initial CVP 16.    Continues to diurese with  lasix drip 15 mg and metolazone. Weight down 5 pounds.  Oxygen down to 6 L Connor Diaz.  He is very sedated after getting Ativan and Benadryl last night.   PCT < 0.01 but BNP 1134.    Echo (9/18): EF 25-30%, mild LVH, mildly dilated RV with normal systolic function, PASP 61 mmHg.   Objective:   Weight Range: 189 lb (85.7 kg) Body mass index is 27.12 kg/m.   Vital Signs:   Temp:  [97.6 F (36.4 C)-98.5 F (36.9 C)] 98 F (36.7 C) (10/02 0400) Pulse Rate:  [55-78] 64 (10/02 0557) Resp:  [15-23] 18 (10/02 0557) BP: (92-127)/(62-90) 127/90 (10/02 0400) SpO2:  [88 %-100 %] 96 % (10/02 0557) Weight:  [189 lb (85.7 kg)] 189 lb (85.7 kg) (10/02 0557) Last BM Date: 11/12/16  Weight change: Filed Weights   11/14/16 0400 11/15/16 0624 11/17/16 0557  Weight: 198 lb (89.8 kg) 194 lb (88 kg) 189 lb (85.7 kg)    Intake/Output:   Intake/Output Summary (Last 24 hours) at 11/17/16 0757 Last data filed at 11/17/16 0700  Gross per 24 hour  Intake              735 ml  Output             2305 ml  Net            -1570 ml      Physical Exam   CVP ~10-11 General: Sedated, occasionally moans Neck: JVP 8-9 cm, no thyromegaly or thyroid nodule.  Lungs: Mild wheezes bilaterally CV: Nondisplaced PMI.  Heart irregular S1/S2, no S3/S4, no murmur.  No peripheral edema.   Abdomen: Soft, nontender, no hepatosplenomegaly, no distention.  Skin: Intact without lesions or rashes.  Neurologic: Sedated  Psych: Sedated. Extremities: No clubbing or cyanosis.  HEENT: Normal.    Telemetry   A Fib 70s. Personally reviewed.    Labs    CBC  Recent Labs  11/16/16 0630 11/17/16 0630  WBC 7.9 8.9  HGB  11.0* 11.2*  HCT 32.8* 33.8*  MCV 98.8 98.5  PLT 165 159   Basic Metabolic Panel  Recent Labs  11/16/16 0630 11/17/16 0338  NA 132* 131*  K 3.9 4.1  CL 90* 86*  CO2 33* 35*  GLUCOSE 79 143*  BUN 88* 98*  CREATININE 2.56* 2.67*  CALCIUM 8.8* 8.7*   Liver Function Tests No results for input(s): AST, ALT, ALKPHOS, BILITOT, PROT, ALBUMIN in the last 72 hours. No results for input(s): LIPASE, AMYLASE in the last 72 hours. Cardiac Enzymes No results for input(s): CKTOTAL, CKMB, CKMBINDEX, TROPONINI in the last 72 hours.  BNP: BNP (last 3 results)  Recent Labs  05/21/16 1140 11/06/16 1106 11/11/16 1030  BNP 962.0* 940.4* 1,137.4*    ProBNP (last 3 results) No results for input(s): PROBNP in the last 8760 hours.  D-Dimer No results for input(s): DDIMER in the last 72 hours. Hemoglobin A1C No results for input(s): HGBA1C in the last 72 hours. Fasting Lipid Panel No results for input(s): CHOL, HDL, LDLCALC, TRIG, CHOLHDL, LDLDIRECT in the last 72 hours. Thyroid Function Tests No results for input(s): TSH, T4TOTAL, T3FREE, THYROIDAB in  the last 72 hours.  Invalid input(s): FREET3  Other results:  Imaging   Dg Chest Port 1 View  Result Date: 11/16/2016 CLINICAL DATA:  Congestion and shortness of breath EXAM: PORTABLE CHEST 1 VIEW COMPARISON:  November 12, 2016 and May 21, 2016 FINDINGS: Central catheter tip is in the superior vena cava near the cavoatrial junction. Pacemaker leads are attached to the right atrium and right ventricle. There are bilateral pleural effusions with persistent interstitial pulmonary edema. There is patchy consolidation in the bases. There is cardiomegaly with pulmonary venous hypertension. No adenopathy is evident. No bone lesions. IMPRESSION: Evidence of chronic congestive heart failure. There may be a degree of underlying interstitial disease of noncardiogenic etiology as well. Airspace consolidation in the bases is likely due to alveolar  edema, although a degree of atelectasis and possible pneumonia in the bases is questioned. The degree of cardiomegaly is stable. Central catheter tip is in the superior vena cava near the cavoatrial junction. No evident pneumothorax. Electronically Signed   By: Bretta Connor Diaz III M.D.   On: 11/16/2016 09:21    Medications:     Scheduled Medications: . apixaban  2.5 mg Oral BID  . atorvastatin  40 mg Oral q1800  . budesonide (PULMICORT) nebulizer solution  0.25 mg Nebulization BID  . carvedilol  6.25 mg Oral BID WC  . famotidine  20 mg Oral QAC breakfast  . feeding supplement (PRO-STAT SUGAR FREE 64)  30 mL Oral BID  . hydrALAZINE  10 mg Oral TID  . ipratropium-albuterol  3 mL Nebulization TID  . isosorbide mononitrate  15 mg Oral Daily  . levothyroxine  75 mcg Oral QAC breakfast  . mouth rinse  15 mL Mouth Rinse BID  . multivitamin with minerals  1 tablet Oral Daily  . omega-3 acid ethyl esters  1 g Oral Daily  . polyethylene glycol  17 g Oral Daily  . senna-docusate  1 tablet Oral BID  . sodium chloride flush  10-40 mL Intracatheter Q12H  . tamsulosin  0.4 mg Oral Daily  . torsemide  80 mg Oral Daily    Infusions:   PRN Medications: acetaminophen, albuterol, morphine injection, nitroGLYCERIN, ondansetron (ZOFRAN) IV, sodium chloride flush, traZODone  Patient Profile   [name] is 83 year old with chronic systolic heart failure, A fib, and limited mobility.  Admitted with hypoxic respiratory failure and marked volume overload.   Assessment/Plan   1. Acute on chronic systolic CHF: Ischemic cardiomyopathy.  Echo (9/18) with EF 25-30% with D-shaped ventricle suggestive of RV pressure/volume overload.  He is on home oxygen, 3 L by [place].  He has developed symptoms and lower extremity edema over the last 2-3 weeks prior to admission. He diuresed well initially but developed respiratory distress on 9/26 and IV diuresis restarted.  Volume status improving. CVP down to 10-11.   BUN/creatinine rising.  - Stop Lasix gtt today.  - Start torsemide 80 mg daily. Prefer once day dosing for home diuretic.   - Continue Coreg 6.25 mg BID.   - Hold off on ACEI/ARB/ARNI for now with elevated creatinine. Added hydralazine 10 mg three times a day + 15 mg imdur daily.   2. CAD: Ischemic cardiomyopathy.  No s/s of ischemia.    - Mild troponin elevation was likely due to demand ischemia from volume overload.   - No S/S ischemia. Continue statin - No ASA given stable CAD and apixaban use.  3. Atrial fibrillation: Persistent since August 2017. He had been on  amiodarone prior to admission.  We attempted DCCV this admission but this failed.  Amiodarone therefore stopped.  - Continue apixaban 2.5 mg twice a day.    4. AKI on CKD stage 3:  BUN/creatinine rising, transitioning to po diuretic today.   5. Deconditioning: Has been wheelchair-bound since he fractured his hip in the spring. PT following. Recommending HHPT.   6. Symptomatic bradycardia: Has St Jude dual chamber pacemaker.  With cardiomyopathy, would limit RV pacing. Back up rate decreased to 60 bpm this admit.  No change. 7. Leg ulcers: WOC following.  8. Acute on chronic hypoxemic respiratory failure: Wears 3L [place] at home.  However, currently requiring 10 L HFNC.  This may be due to pulmonary edema primarily.  Initially concerned for PNA but WBCs and PCT are both low and he is afebrile.  CXR looks like pulmonary edema but cannot rule out basilar PNA.  Still has some wheezing on exam.  - He has completed a 5 day course of cefepime.  - Continue Duonebs - Got an additional dose of Solumedrol 9/27 - On 6 liters oxygen => try to titrate down further today.  9. Delirium: Lethargic this morning after getting Ativan/Benadryl last night.  Should not get these meds again. 10. DNR/DNI  Length of Stay: 3  [name], MD  11/17/2016, 7:57 AM  Advanced Heart Failure Team Pager [phone] (M-F; 7a - 4p)  Please contact [facility]  for night-coverage after hours (4p -7a ) and weekends on [email or web address]  Patient seen with NP, agree with the above note.  CXR yesterday still had evidence for pulmonary edema.  He diuresed well again yesterday, weight down 5 lbs and CVP 10.  Oxygen down to 6L.  BUN/creatinine trending up.  - Stop Lasix gtt, transition to torsemide 80 mg daily.   - Continue current Coreg and hydralazine/nitrates.  - Continue to wean oxygen, he is on 3L at baseline at home.   Should avoid sedating meds in the future (Benadryl, Ativan).  He is very sedated this morning.   Still some wheezing on exam.  Has completed course of antibiotics.  Continue Duonebs.    Agree with palliative care consultation.    [name] 11/17/2016 7:57 AM

## 2016-11-17 NOTE — Care Management Note (Signed)
Case Management Note Original Note Created by Donn Pierini RN, BSN Unit 4E-Case Manager-- 2C-weekend coverage [phone]  Patient Details  Name: Connor Diaz MRN: [id number] Date of Birth: 1923-10-07  Subjective/Objective:  Pt admitted with Acute Resp. Failure- secondary to pulm. Edema and HF, ?HAP               Action/Plan: PTA pt lived at home with wife- has been using w/c lately due to fatigue- CM will follow for d/c needs. May benefit from PT eval   Expected Discharge Date:                  Expected Discharge Plan:  Home w Home Health Services  In-House Referral:     Discharge planning Services  CM Consult  Post Acute Care Choice:    Choice offered to:     DME Arranged:    DME Agency:     HH Arranged:    HH Agency:     Status of Service:  In process, will continue to follow  If discussed at Long Length of Stay Meetings, dates discussed:    Discharge Disposition:   Additional Comments: 11/17/2016  Palliative Care Consult written - if pt continues to decline the plan will be to transition to full comfort care.  Pts wife is now also a pt on unit with PNA.    IV lasix drip d/c due to decline in kidney function - pt switched to torsemide - still requiring HFNC at 8 liters  11/10/16 Pt recently discharged home with [facility], pt is also active with [facility] for home oxygen.  [facility] notified that pt admitted - pt will need HHPT resumption orders prior to discharge.  Pt is also wheelchair bound in the home  [name], RN 11/17/2016, 2:15 PM

## 2016-11-17 NOTE — Progress Notes (Signed)
Nutrition Follow-up  DOCUMENTATION CODES:   Not applicable  INTERVENTION:   -Continue MVI daily -Continue 30 ml Prostat BID, each supplement provides 100 kcals and 15 grams protein  NUTRITION DIAGNOSIS:   Increased nutrient needs related to wound healing as evidenced by estimated needs.  Ongoing  GOAL:   Patient will meet greater than or equal to 90% of their needs  Progressing  MONITOR:   PO intake, Supplement acceptance, Labs, Weight trends, Skin, I & O's  REASON FOR ASSESSMENT:   Consult  (New onset heart failure)  ASSESSMENT:   Pt with PMH of AAA, BPH, CKD Stage III, HTN, hypothyroidism, A fib since 09/2016 , R hip fracture, symptomatic bradycardia St jude dual chamber PPM , and chronic systolic heart failure admitted with worsening SOB, volume overload, severe deconditioning with acute CHF and possible PNA.   CXR on 11/16/16 reveals pulmonary edema. Pt continues to require active diuresis; plan to transition to turosemide due to concern for elevated BUN/Creat.   Pt sitting in recliner chair with several family members in room. RD did not disturb.   Case discussed with RN, who reports she just increased pt's O2 settings. He continues with good appetite, consuming 60-95% meals (averaging 75% meal completion). Per RN, she fed pt 100% of juice, toast, and fruit, and about 50% of cereal. Pt is also taking Prostat and MVI as ordered.   Per RN, plan for family meeting with palliative care tomorrow (11/18/16) to address goals of care.   Labs reviewed: Na: 131, BUN/Creat: 98/2.67.   Diet Order:  Diet Heart Room service appropriate? Yes; Fluid consistency: Thin  Skin:  Wound (see comment) (st 2 sacrum, 3 full thickness wound lt calf)  Last BM:  11/12/16  Height:   Ht Readings from Last 1 Encounters:  11/07/16 5\' 10"  (1.778 m)    Weight:   Wt Readings from Last 1 Encounters:  11/17/16 189 lb (85.7 kg)    Ideal Body Weight:  75.4 kg  BMI:  Body mass index is  27.12 kg/m.  Estimated Nutritional Needs:   Kcal:  1700-1900  Protein:  100-120 grams  Fluid:  >1.5 L/day  EDUCATION NEEDS:   Education needs addressed  Wayman Hoard A. Mayford Knife, RD, LDN, CDE Pager: [phone] After hours Pager: [phone]

## 2016-11-17 NOTE — Progress Notes (Signed)
Error

## 2016-11-17 NOTE — Progress Notes (Signed)
[facility] TEAM 1 - Stepdown/ICU TEAM  Connor Diaz  YIF:[id number] DOB: 01/26/1924 DOA: 11/06/2016 PCP: Oval Linsey, MD    Brief Narrative:  83yo M w/ Hx Nonischemic Cardiomyopathy (S/P pacemaker placement last checked 09/2016), Chronic Systolic CHF (EF of 25-30%), Atrial Fibrillation, CAD, AAA, CKD stage III, HTN, and a hip fx in March 2018 after which he has been "declining" with multiple intervening acute medical problems who presented to the hospital w/ dyspnea.   In the ED the patient was noted to have bibasilar opacification right greater than left with concern for possible pneumonia.   Subjective: Sedate after being given bendaryl and ativan last night, but appears comfortable.  Saturating at 100% on 6L conventional [place].  No uncontrolled pain.    Assessment & Plan:  Goals of Care Palliative Care is meeting w/ the patient and family - at this point the patient and his wife have made it clear to me that he wishes to be comfortable and enjoy the time he has remaining - the plan at this point is to continue active medical care (as it seems to be working and contributing to quality of life), while at the same time assuring the pt is comfortable and not anxious - therefore I do not feel that occasional ativan is unreasonable - should his renal failure progress or his CHF stop responding to diuresis the plan would be to transition to full comfort care - in this case, I suspect a morphine gtt would be required to prevent SOB as he would likely develop severe pulmonary edema and resp distress   Acute hypoxic respiratory failure  Has made progress on lasix gtt, but crt continues to climb - CHF Team transitioning to torsemide today - follow O2 requirements   Acute exacerbation of Chronic systolic congestive heart failure EF 25-30% - ischemic in etiology - care per CHF Team - escalating doses of lasix was being required w/ ultimate change to lasix gtt - net negative ~15L since admit - to  transition back to oral diuretic today - follow Is/Os and weights for stability as this will help determine if d/c home is a possibility   [facility] Weights   11/14/16 0400 11/15/16 0624 11/17/16 0557  Weight: 89.8 kg (198 lb) 88 kg (194 lb) 85.7 kg (189 lb)    Acute kidney injury on CKD stage III Baseline creatinine 1.3-1.5 - crt has been steady climbing w/ diuresis - this may soon prove to be the rate limiting step in our diuretic efforts - discussed w/ family - follow w/ transition back to oral diuretic today   Recent Labs Lab 11/13/16 0800 11/14/16 0451 11/15/16 0530 11/16/16 0630 11/17/16 0338  CREATININE 2.12* 2.24* 2.49* 2.56* 2.67*    Possible PNA Given his acute decline abx was resumed empirically 9/26 - has completed a 5 day tx course - no current evidence of infection   AAA  CAD - mild troponin elevation  Demand ischemia due to volume overload - no chest pain   Chronic persistent atrial fibrillation Chadsvasc is 5 - cont eliquis - DCCV 9/25 was not successful - rate presently controlled - amio stopped by Cards   BPH Cont flomax  Hypothyroidism Cont home synthroid dose   L LE w/ 3 areas of full thickness wounds Care as per WOC Team   GERD  DVT prophylaxis: eliquis Code Status: DNR - NO CODE Family Communication: no family present at time of visit - of note patient's wife has been admitted to the  hospital over the last 24hrs  Disposition Plan: SDU - cont active care for now - determine if can be sent home w/ hospice/palliative care assist   Consultants:  Cardiology  Palliative Care   Procedures: TTE 9/22 EF 25-30% with diffuse hypokinesis   Antimicrobials:  Cefepime 9/21 > 9/23 + 9/26 > 9/30 Vancomycin 9/21 > 9/23    Objective: Blood pressure 127/90, pulse 64, temperature 98 F (36.7 C), temperature source Oral, resp. rate 18, height  (1.778 m), weight 85.7 kg (189 lb), SpO2 100 %.  Intake/Output Summary (Last 24 hours) at 11/17/16 0906 Last  data filed at 11/17/16 0700  Gross per 24 hour  Intake              375 ml  Output             2305 ml  Net            -1930 ml   Filed Weights   11/14/16 0400 11/15/16 0624 11/17/16 0557  Weight: 89.8 kg (198 lb) 88 kg (194 lb) 85.7 kg (189 lb)    Examination: General: sedate - comfortable  Lungs: fine crackles diffusely - no wheezing  Cardiovascular: irregularly irregular - rate at goal  Abdomen: NT/ND, soft, BS+ Extremities: no upper extrem edema - trace B LE edema   CBC:  Recent Labs Lab 11/11/16 1030 11/12/16 0408 11/16/16 0630 11/17/16 0630  WBC 6.7 5.7 7.9 8.9  HGB 11.8* 11.4* 11.0* 11.2*  HCT 36.2* 35.0* 32.8* 33.8*  MCV 101.1* 100.0 98.8 98.5  PLT 170 166 165 159   Basic Metabolic Panel:  Recent Labs Lab 11/11/16 0252  11/13/16 0800 11/14/16 0451 11/15/16 0530 11/16/16 0630 11/17/16 0338  NA 134*  < > 135 133* 134* 132* 131*  K 4.0  < > 4.6 4.7 4.0 3.9 4.1  CL 97*  < > 96* 93* 93* 90* 86*  CO2 29  < > 31 32 33* 33* 35*  GLUCOSE 99  < > 156* 101* 92 79 143*  BUN 44*  < > 58* 69* 83* 88* 98*  CREATININE 2.29*  < > 2.12* 2.24* 2.49* 2.56* 2.67*  CALCIUM 8.5*  < > 8.7* 8.7* 8.6* 8.8* 8.7*  MG 2.1  --   --   --   --   --   --   < > = values in this interval not displayed. GFR: Estimated Creatinine Clearance: 18.6 mL/min (A) (by C-G formula based on SCr of 2.67 mg/dL (H)).  Liver Function Tests: No results for input(s): AST, ALT, ALKPHOS, BILITOT, PROT, ALBUMIN in the last 168 hours. No results for input(s): LIPASE, AMYLASE in the last 168 hours. No results for input(s): AMMONIA in the last 168 hours.   Scheduled Meds: . apixaban  2.5 mg Oral BID  . atorvastatin  40 mg Oral q1800  . budesonide (PULMICORT) nebulizer solution  0.25 mg Nebulization BID  . carvedilol  6.25 mg Oral BID WC  . famotidine  20 mg Oral QAC breakfast  . feeding supplement (PRO-STAT SUGAR FREE 64)  30 mL Oral BID  . hydrALAZINE  10 mg Oral TID  . ipratropium-albuterol  3 mL  Nebulization TID  . isosorbide mononitrate  15 mg Oral Daily  . levothyroxine  75 mcg Oral QAC breakfast  . mouth rinse  15 mL Mouth Rinse BID  . multivitamin with minerals  1 tablet Oral Daily  . omega-3 acid ethyl esters  1 g Oral Daily  . polyethylene  glycol  17 g Oral Daily  . senna-docusate  1 tablet Oral BID  . sodium chloride flush  10-40 mL Intracatheter Q12H  . tamsulosin  0.4 mg Oral Daily  . torsemide  80 mg Oral Daily     LOS: 11 days   Lonia Blood, MD [facility] Office  [phone] Pager - Text Page per Amion as per below:  On-Call/Text Page:      [email or web address]      password [email or web address]  If 7PM-7AM, please contact night-coverage [email or web address] Password [email or web address] 11/17/2016, 9:06 AM

## 2016-11-18 ENCOUNTER — Inpatient Hospital Stay (HOSPITAL_COMMUNITY): Admit: 2016-11-18 | Payer: Medicare Other

## 2016-11-18 DIAGNOSIS — N179 Acute kidney failure, unspecified: Secondary | ICD-10-CM

## 2016-11-18 DIAGNOSIS — E038 Other specified hypothyroidism: Secondary | ICD-10-CM

## 2016-11-18 LAB — BASIC METABOLIC PANEL
Anion gap: 12 (ref 5–15)
BUN: 102 mg/dL — AB (ref 6–20)
CHLORIDE: 84 mmol/L — AB (ref 101–111)
CO2: 37 mmol/L — ABNORMAL HIGH (ref 22–32)
CREATININE: 2.85 mg/dL — AB (ref 0.61–1.24)
Calcium: 9.2 mg/dL (ref 8.9–10.3)
GFR calc Af Amer: 21 mL/min — ABNORMAL LOW (ref >60)
GFR calc non Af Amer: 18 mL/min — ABNORMAL LOW (ref >60)
GLUCOSE: 122 mg/dL — AB (ref 65–99)
POTASSIUM: 4.3 mmol/L (ref 3.5–5.1)
SODIUM: 133 mmol/L — AB (ref 135–145)

## 2016-11-18 LAB — COOXEMETRY PANEL
Carboxyhemoglobin: 2.2 % — ABNORMAL HIGH (ref 0.5–1.5)
METHEMOGLOBIN: 0.7 % (ref 0.0–1.5)
O2 SAT: 58.3 %
TOTAL HEMOGLOBIN: 11.6 g/dL — AB (ref 12.0–16.0)

## 2016-11-18 MED ORDER — TORSEMIDE 20 MG PO TABS
40.0000 mg | ORAL_TABLET | Freq: Every day | ORAL | Status: DC
  Administered 2016-11-18: 40 mg via ORAL
  Filled 2016-11-18: qty 2

## 2016-11-18 NOTE — Progress Notes (Signed)
Chaplain visiting with both husband and wife who is also a patient down the hall.  They are sharing stories of their time together as husband and wife. 36 years.  They are remembering working together and retiring together and vacationing together.  Pleasant visit.      11/18/16 1343  Clinical Encounter Type  Visited With Patient and family together

## 2016-11-18 NOTE — Progress Notes (Signed)
Patient ID: Connor Diaz, male   DOB: 03-16-1923, 83 y.o.   MRN: [id number]     Advanced Heart Failure Rounding Note  Primary Cardiologist: Allred  Subjective:    Failed DCCV 11/10/16. Amiodarone stopped. Coreg increased.   PICC line placed 9/27. Initial CVP 16.    Yesterday lasix drip stopped and 80 mg torsemide daiy started. BUN/creatinine rising. Weight up 1 pound.   Denies SOB   PCT < 0.01 but BNP 1134.    Echo (9/18): EF 25-30%, mild LVH, mildly dilated RV with normal systolic function, PASP 61 mmHg.   Objective:   Weight Range: 190 lb (86.2 kg) Body mass index is 27.26 kg/m.   Vital Signs:   Temp:  [97.4 F (36.3 C)-98.4 F (36.9 C)] 98.2 F (36.8 C) (10/03 0348) Pulse Rate:  [60-73] 65 (10/03 0730) Resp:  [18-24] 19 (10/03 0730) BP: (92-131)/(63-86) 101/86 (10/03 0730) SpO2:  [95 %-100 %] 100 % (10/03 0730) Weight:  [190 lb (86.2 kg)] 190 lb (86.2 kg) (10/03 0608) Last BM Date: 11/12/16  Weight change: Filed Weights   11/15/16 0624 11/17/16 0557 11/18/16 2979  Weight: 194 lb (88 kg) 189 lb (85.7 kg) 190 lb (86.2 kg)    Intake/Output:   Intake/Output Summary (Last 24 hours) at 11/18/16 0736 Last data filed at 11/18/16 0600  Gross per 24 hour  Intake              374 ml  Output             3275 ml  Net            -2901 ml      Physical Exam  CVP 11 General:  Elderly. No resp difficulty HEENT: normal Neck: supple. JVP ~10 . Carotids 2+ bilat; no bruits. No lymphadenopathy or thryomegaly appreciated. Cor: PMI nondisplaced. Regular rate & rhythm. No rubs, gallops or murmurs. Lungs: clear Abdomen: soft, nontender, nondistended. No hepatosplenomegaly. No bruits or masses. Good bowel sounds. Extremities: no cyanosis, clubbing, rash, R and LLE 1+ edema Neuro: alert & orientedx3, cranial nerves grossly intact. moves all 4 extremities w/o difficulty. Affect pleasant  .    Telemetry   A fib 60-70s personally reviewed.    Labs    CBC  Recent  Labs  11/16/16 0630 11/17/16 0630  WBC 7.9 8.9  HGB 11.0* 11.2*  HCT 32.8* 33.8*  MCV 98.8 98.5  PLT 165 159   Basic Metabolic Panel  Recent Labs  11/17/16 0338 11/18/16 0530  NA 131* 133*  K 4.1 4.3  CL 86* 84*  CO2 35* 37*  GLUCOSE 143* 122*  BUN 98* 102*  CREATININE 2.67* 2.85*  CALCIUM 8.7* 9.2   Liver Function Tests No results for input(s): AST, ALT, ALKPHOS, BILITOT, PROT, ALBUMIN in the last 72 hours. No results for input(s): LIPASE, AMYLASE in the last 72 hours. Cardiac Enzymes No results for input(s): CKTOTAL, CKMB, CKMBINDEX, TROPONINI in the last 72 hours.  BNP: BNP (last 3 results)  Recent Labs  05/21/16 1140 11/06/16 1106 11/11/16 1030  BNP 962.0* 940.4* 1,137.4*    ProBNP (last 3 results) No results for input(s): PROBNP in the last 8760 hours.  D-Dimer No results for input(s): DDIMER in the last 72 hours. Hemoglobin A1C No results for input(s): HGBA1C in the last 72 hours. Fasting Lipid Panel No results for input(s): CHOL, HDL, LDLCALC, TRIG, CHOLHDL, LDLDIRECT in the last 72 hours. Thyroid Function Tests No results for input(s): TSH, T4TOTAL, T3FREE, THYROIDAB in  the last 72 hours.  Invalid input(s): FREET3  Other results:  Imaging   No results found.  Medications:     Scheduled Medications: . apixaban  2.5 mg Oral BID  . atorvastatin  40 mg Oral q1800  . budesonide (PULMICORT) nebulizer solution  0.25 mg Nebulization BID  . carvedilol  6.25 mg Oral BID WC  . famotidine  20 mg Oral QAC breakfast  . feeding supplement (PRO-STAT SUGAR FREE 64)  30 mL Oral BID  . hydrALAZINE  10 mg Oral TID  . ipratropium-albuterol  3 mL Nebulization TID  . isosorbide mononitrate  15 mg Oral Daily  . levothyroxine  75 mcg Oral QAC breakfast  . mouth rinse  15 mL Mouth Rinse BID  . multivitamin with minerals  1 tablet Oral Daily  . omega-3 acid ethyl esters  1 g Oral Daily  . polyethylene glycol  17 g Oral Daily  . senna-docusate  1 tablet  Oral BID  . sodium chloride flush  10-40 mL Intracatheter Q12H  . tamsulosin  0.4 mg Oral Daily  . torsemide  80 mg Oral Daily    Infusions:   PRN Medications: acetaminophen, albuterol, LORazepam, morphine injection, nitroGLYCERIN, ondansetron (ZOFRAN) IV, sodium chloride flush, traZODone  Patient Profile   Connor Diaz is 83 year old with chronic systolic heart failure, A fib, and limited mobility.  Admitted with hypoxic respiratory failure and marked volume overload.   Assessment/Plan   1. Acute on chronic systolic CHF: Ischemic cardiomyopathy.  Echo (9/18) with EF 25-30% with D-shaped ventricle suggestive of RV pressure/volume overload.  He is on home oxygen, 3 L by [place].  He has developed symptoms and lower extremity edema over the last 2-3 weeks prior to admission. He diuresed well initially but developed respiratory distress on 9/26 and IV diuresis restarted.  Volume status improving. CVP 11. BUN/creatinine rising.  - Cut back torsemide to 40 mg daily.  - Continue Coreg 6.25 mg BID.   - Hold off on ACEI/ARB/ARNI for now with elevated creatinine. Added hydralazine 10 mg three times a day + 15 mg imdur daily.   2. CAD: Ischemic cardiomyopathy.  No s/s of ischemia.    - Mild troponin elevation was likely due to demand ischemia from volume overload.   - No S/S ischemia. Continue statin - No ASA given stable CAD and apixaban use.  3. Atrial fibrillation: Persistent since August 2017. He had been on amiodarone prior to admission.  We attempted DCCV this admission but this failed.  Amiodarone therefore stopped. Rate controlled.   - Continue apixaban 2.5 mg twice a day.    4. AKI on CKD stage 3:  BUN/Creatinine rising. CVP 11. Cut back torsemide to 40 mg daily.  5. Deconditioning: Has been wheelchair-bound since he fractured his hip in the spring. PT following. Recommending HHPT.   6. Symptomatic bradycardia: Has [employer] dual chamber pacemaker.  With cardiomyopathy, would limit RV pacing.  Back up rate decreased to 60 bpm this admit.  No change. 7. Leg ulcers: WOC following.  8. Acute on chronic hypoxemic respiratory failure: Wears 3L  at home.  However, initially required 10 L HFNC.  This may be due to pulmonary edema primarily.  Initially concerned for PNA but WBCs and PCT are both low and he is afebrile.  CXR looks like pulmonary edema but cannot rule out basilar PNA.  Still has some wheezing on exam.  - He has completed a 5 day course of cefepime.  - Continue Duonebs -  Got an additional dose of Solumedrol 9/27 - On 6 liters oxygen => try to titrate down further today. Discussed with his nurse to wean today.  9. Delirium:  10. DNR/DNI  Length of Stay: 12  Tonye Becket, NP  11/18/2016, 7:36 AM   Patient seen with NP, agree with the above note.  Lungs sound much better on exam today, I/Os negative again yesterday on po torsemide but BUN/creatinine up again.  CVP remains 10-11.  He is down to 6L [place] oxygen.  More alert this morning.   Would cut torsemide to 40 mg daily today, continue to follow BUN/creatinine closely.   If he clinically worsens, would favor comfort care.  I think that renal function is going to be our rate-limiting step at this point.   [name] 11/18/2016 8:02 AM   Advanced Heart Failure Team Pager [phone] (M-F; 7a - 4p)  Please contact [facility] for night-coverage after hours (4p -7a ) and weekends on [email or web address]

## 2016-11-18 NOTE — Progress Notes (Addendum)
PROGRESS NOTE    Connor Diaz  AXE:[id number] DOB: 09/28/1923 DOA: 11/06/2016 PCP: Oval Linsey, MD   Brief Narrative:  83 y.o. WM PMHx Nonischemic Cardiomyopathy (S/P pacemaker placement last checked 09/2016), CHRONIC Systolic CHF (EF of 25-30%), Atrial Fibrillation, CAD, AAA, CKD stage III, HTN,  March 2018 sustained a hip fracture. Since then patient and family note that he has been "declining"with multiple intervening acute medical problems. Patient is presently wheelchair bound due to fatigue and debilitation. He notes that he has been having a slow progressive decrease in his exercise tolerance over the past several months which has increased over the past week. Patient notes that over the past week he has been short of breath even at rest which is unusual for him. He admits to orthopnea and PND at home. Patient has been managed by his PCP with Lasix 40 twice a day and has been told that the Lasix dose was conservative due to concerns about kidney function. Patient is also noted increasing abdominal girth and markedly lower extremity edema which has been treated with wrapping of legs.   This morning patient was markedly short of breath and gasping for air at rest. He had an outpatient M.D. appointment and was told to come to the ED from that appointment. Patient denies any acute chest or shoulder or jaw pain.   Patient denies fevers or chills. No malaise other than his acutely worsening shortness of breath. No sweats. Patient has baseline cough but that has not really changed from usual. No sputum production.    Subjective: 10/3 A/O 4, negative CP, negative abdominal pain, negative N/V. Positive acute on chronic SOB (improved from last week). Positive fatigue.      Assessment & Plan:   Principal Problem:   Acute respiratory failure with hypoxia (HCC) Active Problems:   CAD (coronary artery disease)   Acute combined systolic and diastolic heart failure (HCC)   Paroxysmal  atrial fibrillation (HCC)   Prostate hypertrophy   HAP (hospital-acquired pneumonia)   Acute Respiratory Failure with Hypoxia -on Admission SPO2=64% on room air,placed on BiPAP -consistent with CHF exacerbation.   Acute on Chronic systolic CHF  -9/22 Echocardiogram: C/W systolic CHF and Pulmonary HTN see results below -negative troponin -Coreg 6.25 mg BID -hydralazine 10 mg TID -imdur 15 mg daily -Torsemide 40 mg daily -Strict in and out since admission -18 L -Daily weight Filed Weights   11/15/16 0624 11/17/16 0557 11/18/16 1103  Weight: 194 lb (88 kg) 189 lb (85.7 kg) 190 lb (86.2 kg)  -Echocardiogram: EF unchanged significantly increased pulmonary HTN see results below  -Continued fluid overload: 10/3 CVP= ??  AAA without rupture -Increasing size patient family aware. Has refused further intervention   Pulmonary HTN -See CHF  Chronic Atrial fibrillation(CHADSVASC score 5) -See CHF -Eliquis  -9/25 S/P unsuccessful DCCV  -Currently in NSR  Acute on CKD stage III(Baseline Cr 1.3-1.5)    Recent Labs Lab 11/13/16 0800 11/14/16 0451 11/15/16 0530 11/16/16 0630 11/17/16 0338 11/18/16 0530  CREATININE 2.12* 2.24* 2.49* 2.56* 2.67* 2.85*   BPH -  Flomax 0.4 mg daily  HYPOTHYROIDISM -Synthroid 75 g daily   GERD Continue Pepcid  LLE w/ 3 areas of full thickness wounds Care as per WOC Team   Goals of care -PALLIATIVE CARE: Awaiting palliative care evaluation and recommendations.   DVT prophylaxis: Eliquis Code Status: DO NOT RESUSCITATE Family Communication: None Disposition Plan: TBD   Consultants:  Cardiology PALLIATIVE CARE    Procedures/Significant Events:  -9/22 Echocardiogram:  LVEF= 25% to 30%.- Diffuse hypokinesis worse in the inferolateral and anterolateral myocardium. -Left atrium: The appendage was moderately dilated. -Pulmonary arteries: PA peak pressure: 61 mm Hg (S). 9/25 DCCV: Unsuccessful    I have personally reviewed and  interpreted all radiology studies and my findings are as above.  VENTILATOR SETTINGS: None   Cultures 9/22 MRSA by PCR negative     Antimicrobials: Anti-infectives    Start     Dose/Rate Stop   11/07/16 1400  vancomycin (VANCOCIN) IVPB 1000 mg/200 mL premix     1,000 mg 200 mL/hr over 60 Minutes     11/07/16 1330  ceFEPIme (MAXIPIME) 1 g in dextrose 5 % 50 mL IVPB     1 g 100 mL/hr over 30 Minutes     11/06/16 1300  ceFEPIme (MAXIPIME) 2 g in dextrose 5 % 50 mL IVPB     2 g 100 mL/hr over 30 Minutes 11/06/16 1403   11/06/16 1300  vancomycin (VANCOCIN) 1,750 mg in sodium chloride 0.9 % 500 mL IVPB     1,750 mg 250 mL/hr over 120 Minutes 11/06/16 1830      Devices    LINES / TUBES:      Continuous Infusions:    Objective: Vitals:   11/17/16 1934 11/17/16 2330 11/18/16 0348 11/18/16 0608  BP: 121/81 131/74 109/67   Pulse: 73 68 72   Resp: (!) 21 (!) 24 18   Temp: (!) 97.4 F (36.3 C) (!) 97.4 F (36.3 C) 98.2 F (36.8 C)   TempSrc: Oral Oral Oral   SpO2: 100% 95% 100%   Weight:    190 lb (86.2 kg)  Height:        Intake/Output Summary (Last 24 hours) at 11/18/16 0729 Last data filed at 11/18/16 0600  Gross per 24 hour  Intake              374 ml  Output             3275 ml  Net            -2901 ml   Filed Weights   11/15/16 0624 11/17/16 0557 11/18/16 1610  Weight: 194 lb (88 kg) 189 lb (85.7 kg) 190 lb (86.2 kg)     Physical Exam:  General: A/O 4, positive acute on chronic respiratory distress Neck:  Negative scars, masses, torticollis, lymphadenopathy, JVD Lungs: positive diffuse crackles, decreased air movement but improved  Cardiovascular: Regular rate and rhythm without murmur gallop or rub normal S1 and S2 Abdomen: negative abdominal pain, nondistended, positive soft, bowel sounds, no rebound, no ascites, no appreciable mass Extremities: No significant cyanosis, clubbing. +1 pitting edema bilateral, Skin: Venous stasis ulcers covered  and clean. Psychiatric:  Negative depression, negative anxiety, negative fatigue, negative mania  Central nervous system:  Cranial nerves II through XII intact, tongue/uvula midline, all extremities muscle strength 5/5, sensation intact throughout, negative dysarthria, negative expressive aphasia, negative receptive aphasia.   .     Data Reviewed: Care during the described time interval was provided by me .  I have reviewed this patient's available data, including medical history, events of note, physical examination, and all test results as part of my evaluation.   CBC:  Recent Labs Lab 11/11/16 1030 11/12/16 0408 11/16/16 0630 11/17/16 0630  WBC 6.7 5.7 7.9 8.9  HGB 11.8* 11.4* 11.0* 11.2*  HCT 36.2* 35.0* 32.8* 33.8*  MCV 101.1* 100.0 98.8 98.5  PLT 170 166 165 159   Basic Metabolic  Panel:  Recent Labs Lab 11/14/16 0451 11/15/16 0530 11/16/16 0630 11/17/16 0338 11/18/16 0530  NA 133* 134* 132* 131* 133*  K 4.7 4.0 3.9 4.1 4.3  CL 93* 93* 90* 86* 84*  CO2 32 33* 33* 35* 37*  GLUCOSE 101* 92 79 143* 122*  BUN 69* 83* 88* 98* 102*  CREATININE 2.24* 2.49* 2.56* 2.67* 2.85*  CALCIUM 8.7* 8.6* 8.8* 8.7* 9.2   GFR: Estimated Creatinine Clearance: 17.4 mL/min (A) (by C-G formula based on SCr of 2.85 mg/dL (H)). Liver Function Tests: No results for input(s): AST, ALT, ALKPHOS, BILITOT, PROT, ALBUMIN in the last 168 hours. No results for input(s): LIPASE, AMYLASE in the last 168 hours. No results for input(s): AMMONIA in the last 168 hours. Coagulation Profile: No results for input(s): INR, PROTIME in the last 168 hours. Cardiac Enzymes: No results for input(s): CKTOTAL, CKMB, CKMBINDEX, TROPONINI in the last 168 hours. BNP (last 3 results) No results for input(s): PROBNP in the last 8760 hours. HbA1C: No results for input(s): HGBA1C in the last 72 hours. CBG:  Recent Labs Lab 11/13/16 0803 11/17/16 0819 11/17/16 1301 11/17/16 1745  GLUCAP 150* 105* 123* 96     Lipid Profile: No results for input(s): CHOL, HDL, LDLCALC, TRIG, CHOLHDL, LDLDIRECT in the last 72 hours. Thyroid Function Tests: No results for input(s): TSH, T4TOTAL, FREET4, T3FREE, THYROIDAB in the last 72 hours. Anemia Panel: No results for input(s): VITAMINB12, FOLATE, FERRITIN, TIBC, IRON, RETICCTPCT in the last 72 hours. Urine analysis:    Component Value Date/Time   COLORURINE RED (A) 10/04/2016 1125   APPEARANCEUR TURBID (A) 10/04/2016 1125   LABSPEC  10/04/2016 1125    TEST NOT REPORTED DUE TO COLOR INTERFERENCE OF URINE PIGMENT   PHURINE  10/04/2016 1125    TEST NOT REPORTED DUE TO COLOR INTERFERENCE OF URINE PIGMENT   GLUCOSEU (A) 10/04/2016 1125    TEST NOT REPORTED DUE TO COLOR INTERFERENCE OF URINE PIGMENT   HGBUR (A) 10/04/2016 1125    TEST NOT REPORTED DUE TO COLOR INTERFERENCE OF URINE PIGMENT   BILIRUBINUR (A) 10/04/2016 1125    TEST NOT REPORTED DUE TO COLOR INTERFERENCE OF URINE PIGMENT   KETONESUR (A) 10/04/2016 1125    TEST NOT REPORTED DUE TO COLOR INTERFERENCE OF URINE PIGMENT   PROTEINUR (A) 10/04/2016 1125    TEST NOT REPORTED DUE TO COLOR INTERFERENCE OF URINE PIGMENT   NITRITE (A) 10/04/2016 1125    TEST NOT REPORTED DUE TO COLOR INTERFERENCE OF URINE PIGMENT   LEUKOCYTESUR (A) 10/04/2016 1125    TEST NOT REPORTED DUE TO COLOR INTERFERENCE OF URINE PIGMENT   Sepsis Labs: (procalcitonin:4,lacticidven:4)  ) No results found for this or any previous visit (from the past 240 hour(s)).       Radiology Studies: Dg Chest Port 1 View  Result Date: 11/16/2016 CLINICAL DATA:  Congestion and shortness of breath EXAM: PORTABLE CHEST 1 VIEW COMPARISON:  November 12, 2016 and May 21, 2016 FINDINGS: Central catheter tip is in the superior vena cava near the cavoatrial junction. Pacemaker leads are attached to the right atrium and right ventricle. There are bilateral pleural effusions with persistent interstitial pulmonary edema. There is  patchy consolidation in the bases. There is cardiomegaly with pulmonary venous hypertension. No adenopathy is evident. No bone lesions. IMPRESSION: Evidence of chronic congestive heart failure. There may be a degree of underlying interstitial disease of noncardiogenic etiology as well. Airspace consolidation in the bases is likely due to alveolar edema, although a  degree of atelectasis and possible pneumonia in the bases is questioned. The degree of cardiomegaly is stable. Central catheter tip is in the superior vena cava near the cavoatrial junction. No evident pneumothorax. Electronically Signed   By: Bretta Bang III M.D.   On: 11/16/2016 09:21        Scheduled Meds: . apixaban  2.5 mg Oral BID  . atorvastatin  40 mg Oral q1800  . budesonide (PULMICORT) nebulizer solution  0.25 mg Nebulization BID  . carvedilol  6.25 mg Oral BID WC  . famotidine  20 mg Oral QAC breakfast  . feeding supplement (PRO-STAT SUGAR FREE 64)  30 mL Oral BID  . hydrALAZINE  10 mg Oral TID  . ipratropium-albuterol  3 mL Nebulization TID  . isosorbide mononitrate  15 mg Oral Daily  . levothyroxine  75 mcg Oral QAC breakfast  . mouth rinse  15 mL Mouth Rinse BID  . multivitamin with minerals  1 tablet Oral Daily  . omega-3 acid ethyl esters  1 g Oral Daily  . polyethylene glycol  17 g Oral Daily  . senna-docusate  1 tablet Oral BID  . sodium chloride flush  10-40 mL Intracatheter Q12H  . tamsulosin  0.4 mg Oral Daily  . torsemide  80 mg Oral Daily   Continuous Infusions:    LOS: 12 days    Time spent: 40 minutes    Santresa Levett, Roselind Messier, MD [facility] Pager [phone]   If 7PM-7AM, please contact night-coverage [email or web address] Password [email or web address] 11/18/2016, 7:29 AM

## 2016-11-18 NOTE — Progress Notes (Signed)
Desats to 80's, HFNC increased to 10L, sat-90's continue to monitor.

## 2016-11-18 NOTE — Care Management Note (Addendum)
Case Management Note Original Note Created by Donn Pierini RN, BSN Unit 4E-Case Manager-- 2C-weekend coverage [phone]  Patient Details  Name: Connor Diaz MRN: [id number] Date of Birth: Sep 06, 1923  Subjective/Objective:  Pt admitted with Acute Resp. Failure- secondary to pulm. Edema and HF, ?HAP               Action/Plan: PTA pt lived at home with wife- has been using w/c lately due to fatigue- CM will follow for d/c needs. May benefit from PT eval   Expected Discharge Date:                  Expected Discharge Plan:  Skilled Nursing Facility  In-House Referral:  Clinical Social Work  Discharge planning Services  CM Consult  Post Acute Care Choice:    Choice offered to:     DME Arranged:    DME Agency:     HH Arranged:    HH Agency:     Status of Service:  In process, will continue to follow  If discussed at Long Length of Stay Meetings, dates discussed:    Discharge Disposition:   Additional Comments: 11/19/16 Pt is continuing to require 10L HFNC and SNF will not be able to accommodate.  Attending gave LTACH referral directly to [facility] Liaison for palliative program.  Attending and palliative discussed program and discharge plan with family - family in agreement and are only interested in [facility] per palliative.   Facilitation of discharge to [facility] orchestrated by attending service, palliative team, [facility] liaison and unit staff  11/18/2016  SNF recommended - CSW consulted.  Palliative following  Palliative Care Consult written - if pt continues to decline the plan will be to transition to full comfort care.  Pts wife is now also a pt on unit with PNA.    IV lasix drip d/c due to decline in kidney function - pt switched to torsemide - still requiring HFNC at 8 liters  11/10/16 Pt recently discharged home with [facility], pt is also [facility] with [facility] for [facility].  [facility] notified that pt admitted - pt will need HHPT resumption orders prior to discharge.   Pt is also wheelchair bound in the home  [name], RN 11/18/2016, 3:27 PM

## 2016-11-18 NOTE — Progress Notes (Signed)
Physical Therapy Treatment Patient Details Name: Connor Diaz MRN: [id number] DOB: June 09, 1923 Today's Date: 11/18/2016    History of Present Illness Mr Swilley is a 83 year old with a history of AAA, BPH, CKD Stage III, DGD, HTN, hypothyroidism, A fib since 09/2016 , R hip fracture, symptomatic bradycardia [employer] jude dual chamber PPM , and chronic systolic heart failure. Admitted with SOB and 20# weight gain.    PT Comments    Pt admitted with above diagnosis. Pt currently with functional limitations due to the deficits listed below (see PT Problem List). Pt was able to transfer to chair with +2 mod assist.  O2 sats on 6LHFNC dropped to 83% therefore incr to 10LHFNC and sats >90% in 3 min.  Nurse aware of desat and that pt is on 10LHFNC.  Pt comfortable in chair.  Pt kept repeating, "Everyone thinks I am about to die.  I am ready to meet Jesus."  Will continue to follow as pt able.  Pt will benefit from skilled PT to increase their independence and safety with mobility to allow discharge to the venue listed below.     Follow Up Recommendations  Supervision/Assistance - 24 hour;SNF (HHPT only if 24 hour care can be arranged)     Equipment Recommendations  None recommended by PT    Recommendations for Other Services       Precautions / Restrictions Precautions Precautions: Fall Precaution Comments: O2 3.5L @ home Restrictions Weight Bearing Restrictions: No    Mobility  Bed Mobility Overal bed mobility: Needs Assistance Bed Mobility: Supine to Sit     Supine to sit: Max assist;+2 for physical assistance;HOB elevated     General bed mobility comments: assist to mobilize R LE and get to EOB and to elevate trunk into sitting; cues for sequencing and technique  Transfers Overall transfer level: Needs assistance Equipment used: Rolling walker (2 wheeled) Transfers: Sit to/from [employer] Sit to Stand: +2 physical assistance;From elevated surface;Mod  assist Stand pivot transfers: Mod assist;+2 physical assistance       General transfer comment: cues for technique; assist to power up into standing and for balance and weight shifting during pivot  Ambulation/Gait                 Stairs            Wheelchair Mobility    Modified Rankin (Stroke Patients Only)       Balance Overall balance assessment: Needs assistance Sitting-balance support: Feet supported;Bilateral upper extremity supported Sitting balance-Leahy Scale: Fair     Standing balance support: Bilateral upper extremity supported Standing balance-Leahy Scale: Poor Standing balance comment: UE support for balance                            Cognition Arousal/Alertness: Awake/alert Behavior During Therapy: WFL for tasks assessed/performed Overall Cognitive Status: Within Functional Limits for tasks assessed                                        Exercises      General Comments General comments (skin integrity, edema, etc.): Pt was 93% on 6LHFNC at rest.  After getting to chair, desat to 83% on 6LHFNC therefore bumped up to 10LHFNC and sats >90% after about 3 min of rest in chair. Nurse aware and will titrate pt prn.  Pertinent Vitals/Pain Pain Assessment: No/denies pain    Home Living                      Prior Function            PT Goals (current goals can now be found in the care plan section) Progress towards PT goals: Not progressing toward goals - comment (transfer to chair only, desat on 6LHFNC)    Frequency    Min 3X/week      PT Plan Discharge plan needs to be updated    Co-evaluation              AM-PAC PT "6 Clicks" Daily Activity  Outcome Measure  Difficulty turning over in bed (including adjusting bedclothes, sheets and blankets)?: A Little Difficulty moving from lying on back to sitting on the side of the bed? : A Little Difficulty sitting down on and standing up  from a chair with arms (e.g., wheelchair, bedside commode, etc,.)?: A Lot Help needed moving to and from a bed to chair (including a wheelchair)?: A Lot Help needed walking in hospital room?: A Lot Help needed climbing 3-5 steps with a railing? : A Lot 6 Click Score: 14    End of Session Equipment Utilized During Treatment: Gait belt;Oxygen (10LHFNC) Activity Tolerance: Patient limited by fatigue Patient left: in chair;with call bell/phone within reach;with chair alarm set Nurse Communication: Mobility status;Need for lift equipment PT Visit Diagnosis: Other abnormalities of gait and mobility (R26.89);Muscle weakness (generalized) (M62.81)     Time: 5409-8119 PT Time Calculation (min) (ACUTE ONLY): 18 min  Charges:  $Therapeutic Activity: 8-22 mins                    G Codes:       Francee Setzer,PT Acute Rehabilitation [phone] (pager)    Berline Lopes 11/18/2016, 11:19 AM

## 2016-11-19 DIAGNOSIS — I509 Heart failure, unspecified: Secondary | ICD-10-CM

## 2016-11-19 DIAGNOSIS — Z7189 Other specified counseling: Secondary | ICD-10-CM

## 2016-11-19 DIAGNOSIS — R0600 Dyspnea, unspecified: Secondary | ICD-10-CM

## 2016-11-19 DIAGNOSIS — Z515 Encounter for palliative care: Secondary | ICD-10-CM

## 2016-11-19 LAB — CBC
HCT: 30 % — ABNORMAL LOW (ref 39.0–52.0)
Hemoglobin: 10.2 g/dL — ABNORMAL LOW (ref 13.0–17.0)
MCH: 33.3 pg (ref 26.0–34.0)
MCHC: 34 g/dL (ref 30.0–36.0)
MCV: 98 fL (ref 78.0–100.0)
PLATELETS: 124 10*3/uL — AB (ref 150–400)
RBC: 3.06 MIL/uL — AB (ref 4.22–5.81)
RDW: 17.5 % — AB (ref 11.5–15.5)
WBC: 8.4 10*3/uL (ref 4.0–10.5)

## 2016-11-19 LAB — COOXEMETRY PANEL
CARBOXYHEMOGLOBIN: 1.8 % — AB (ref 0.5–1.5)
METHEMOGLOBIN: 1.2 % (ref 0.0–1.5)
O2 SAT: 57.6 %
TOTAL HEMOGLOBIN: 10.9 g/dL — AB (ref 12.0–16.0)

## 2016-11-19 LAB — BASIC METABOLIC PANEL
Anion gap: 10 (ref 5–15)
BUN: 103 mg/dL — AB (ref 6–20)
CALCIUM: 8.9 mg/dL (ref 8.9–10.3)
CO2: 39 mmol/L — ABNORMAL HIGH (ref 22–32)
CREATININE: 3.06 mg/dL — AB (ref 0.61–1.24)
Chloride: 84 mmol/L — ABNORMAL LOW (ref 101–111)
GFR, EST AFRICAN AMERICAN: 19 mL/min — AB (ref >60)
GFR, EST NON AFRICAN AMERICAN: 16 mL/min — AB (ref >60)
Glucose, Bld: 102 mg/dL — ABNORMAL HIGH (ref 65–99)
Potassium: 4.3 mmol/L (ref 3.5–5.1)
SODIUM: 133 mmol/L — AB (ref 135–145)

## 2016-11-19 MED ORDER — TRAZODONE HCL 50 MG PO TABS
50.0000 mg | ORAL_TABLET | Freq: Every day | ORAL | Status: DC
  Administered 2016-11-19 – 2016-11-20 (×2): 50 mg via ORAL
  Filled 2016-11-19 (×2): qty 1

## 2016-11-19 NOTE — Progress Notes (Signed)
Pt refused to wear Bipap. RT will continue to monitor. 

## 2016-11-19 NOTE — Progress Notes (Addendum)
Consultation Note Date: 11/19/2016   Patient Name: Connor Diaz  DOB: [birth year]  MRN: [id number]  Age / Sex: 83 y.o., male  PCP: Lucia Gaskins, MD Referring Physician: Allie Bossier, MD  Reason for Consultation: Disposition and Establishing goals of care  HPI/Patient Profile: 83 y.o. male  with past medical history of nonischemic cardiomyopathy, pacemaker placement, CHF (EF 25-30%), a fib, CAD, AAA, CKD stg 3, and HTN. He sustained a hip fracture in March 2018 and has been wheelchair bound since. He has experienced a slow decline since the hip fracture.   He was admitted on 11/06/2016 with severe shortness of breath. On admission he was found to have acute respiratory failure d/t systolic heart failure and probable pneumonia. Throughout admission he has required bipap intermittently, aggressive IV diuretics, and antibiotics. His treatment has been complicated by CKD and rising creatinine.   Palliative care consulted to discuss goals of care with patient's family.   Clinical Assessment and Goals of Care:  Kathie Rhodes, NP and Mariana Kaufman, NP met with patient's wife and their children to discuss goals of care. We introduced the role of palliative care and the differences between hospice and palliative care.   Family shared the decline they have seen in the patient over the previous months d/t his dyspnea and weakness. They were clear that they do not want him to suffer and their goals for him are to be comfortable, maintain dignity, and focus on quality of life. They state that he has shared with them before that these were his goals. He has completed a living will previously and indicated he would not want aggressive measures - no resuscitation or feeding tubes.   Family is most concerned about placement of patient, knowing that he would not want to spend his end of life somewhere without his wife. At present, his wife is hospitalized and  also needs care outside of the hospital. Per [facility] representative, they feel they have a viable option for this family where the two could be placed in a room together while the wife receives rehab and the patient receives palliative care. The family is hopeful this option could work out so that the couple could stay together.   Primary Decision Maker NEXT OF KIN - wife Connor Diaz    SUMMARY OF RECOMMENDATIONS    - Social work consult for referral to [facility] per discussion with attending physician   - Comfort measures in addition to current level of care- do not escalate care    Code Status/Advance Care Planning:  DNR   Symptom Management:   - PRN morphine for dyspnea  - PRN ativan for anxiety  Palliative Prophylaxis:   Delirium Protocol, Frequent Pain Assessment and Oral Care  Additional Recommendations (Limitations, Scope, Preferences):  Avoid Hospitalization, Minimize Medications and No Artificial Feeding  Prognosis:   < 6 weeks d/t end stage heart failure, acute on chronic kidney failure, transition in [place] to focus on comfort  Discharge Planning: LTAC for end of life care      Primary Diagnoses: Present on Admission: . Acute respiratory failure with hypoxia ([place]) . Acute combined systolic and diastolic heart failure ([place]) . CAD (coronary artery disease) . Paroxysmal atrial fibrillation (HCC) . Prostate hypertrophy   I have reviewed the medical record, interviewed the patient and family, and examined the patient. The following aspects are pertinent.  Past Medical History:  Diagnosis Date  . AAA (abdominal aortic aneurysm) ([place])    a. 02/2009 U/S 4.4 x 4.5 cm  .  BPH (benign prostatic hyperplasia)   . CKD (chronic kidney disease), stage III   . Coronary artery disease   . DJD (degenerative joint disease)   . History of tobacco abuse    a. roughly 36 pack years, quit @ age 23.  Marland Kitchen Hypertension   . Hypothyroidism   . Non-ischemic cardiomyopathy ([place])   .  Pancreatitis    a. 10/2011 - managed conservatively @ home.  . Persistent atrial fibrillation ([place])   . Symptomatic bradycardia    a. s/p STJ dual chamber PPM   . Systolic heart failure ([place])    02/2013   Social History   Social History  . Marital status: Married    Spouse name: N/A  . Number of children: N/A  . Years of education: N/A   Occupational History  . retired    Social History Main Topics  . Smoking status: Former Smoker    Packs/day: 1.00    Years: 35.00    Types: Cigarettes    Quit date: 12/17/1980  . Smokeless tobacco: Never Used     Comment: Quit at age 53.  Marland Kitchen Alcohol use No  . Drug use: No  . Sexual activity: Not Currently   Other Topics Concern  . None   Social History Narrative   Pt lives in [place] with his wife.  He is retired from [employer].  He is relatively active @ home.   Family History  Problem Relation Age of Onset  . Other Father        Accidental death @ age 39 - box fell on him at work  . Cancer Mother        died @ 70 Bladder cancer   Scheduled Meds: . apixaban  2.5 mg Oral BID  . atorvastatin  40 mg Oral q1800  . budesonide (PULMICORT) nebulizer solution  0.25 mg Nebulization BID  . carvedilol  6.25 mg Oral BID WC  . famotidine  20 mg Oral QAC breakfast  . feeding supplement (PRO-STAT SUGAR FREE 64)  30 mL Oral BID  . ipratropium-albuterol  3 mL Nebulization TID  . isosorbide mononitrate  15 mg Oral Daily  . levothyroxine  75 mcg Oral QAC breakfast  . mouth rinse  15 mL Mouth Rinse BID  . multivitamin with minerals  1 tablet Oral Daily  . omega-3 acid ethyl esters  1 g Oral Daily  . polyethylene glycol  17 g Oral Daily  . senna-docusate  1 tablet Oral BID  . sodium chloride flush  10-40 mL Intracatheter Q12H  . tamsulosin  0.4 mg Oral Daily   Continuous Infusions: PRN Meds:.acetaminophen, albuterol, LORazepam, morphine injection, nitroGLYCERIN, ondansetron (ZOFRAN) IV, sodium chloride flush,  traZODone Allergies  Allergen Reactions  . Iodinated Diagnostic Agents Rash and Swelling    Swelling in face, urticaria. Received Isovoc 370 (non-ionic) on 10/24/14.  . Isovue [Iopamidol] Itching and Swelling    Pt had slight erythema, itching and facial swelling right ear and right lower chin.  Pt sneezed several times after contrast injection.  Pt was monitored for 1 hr post injection and was given water to drink. Dr Alvester Chou feels he had a very mild reaction.  Premeds are warranted in this patient.  Thanks, J Bohm   Review of Systems  Constitutional: Positive for activity change and fatigue.  Respiratory: Positive for shortness of breath.   Neurological: Positive for weakness.  Psychiatric/Behavioral: Positive for agitation. The patient is nervous/anxious.     Physical Exam  Constitutional:  No distress.  Cardiovascular: Normal rate and regular rhythm.   Pulmonary/Chest: Effort normal. No respiratory distress.  Skin: Skin is warm and dry. He is not diaphoretic.    Vital Signs: BP (!) 112/96 (BP Location: Left Arm)   Pulse 81   Temp 97.7 F (36.5 C) (Oral)   Resp (!) 25   Ht '5\' 10"'$  (1.778 m)   Wt 84.4 kg (186 lb)   SpO2 94%   BMI 26.69 kg/m  Pain Assessment: No/denies pain POSS *See Group Information*: 1-Acceptable,Awake and alert Pain Score: 6    SpO2: SpO2: 94 % O2 Device:SpO2: 94 % O2 Flow Rate: .O2 Flow Rate (L/min): 7 L/min  IO: Intake/output summary:  Intake/Output Summary (Last 24 hours) at 11/19/16 1607 Last data filed at 11/19/16 1200  Gross per 24 hour  Intake              200 ml  Output             1925 ml  Net            -1725 ml    LBM: Last BM Date: 11/12/16 Baseline Weight: Weight: 86 kg (189 lb 9.5 oz) Most recent weight: Weight: 84.4 kg (186 lb)     Palliative Assessment/Data:30%     Time In: 15:00 Time Out: 16:30 Time Total: 90 minutes  Greater than 50%  of this time was spent counseling and coordinating care related to the above  assessment and plan.  Juel Burrow, DNP, AGNP-C Palliative Medicine Team [phone]

## 2016-11-19 NOTE — Progress Notes (Signed)
PROGRESS NOTE    Connor Diaz  XBJ:[id number] DOB: 08-Nov-1923 DOA: 11/06/2016 PCP: Oval Linsey, MD   Brief Narrative:  83 y.o. WM PMHx Nonischemic Cardiomyopathy (S/P pacemaker placement last checked 09/2016), CHRONIC Systolic CHF (EF of 25-30%), Atrial Fibrillation, CAD, AAA, CKD stage III, HTN,  March 2018 sustained a hip fracture. Since then patient and family note that he has been "declining"with multiple intervening acute medical problems. Patient is presently wheelchair bound due to fatigue and debilitation. He notes that he has been having a slow progressive decrease in his exercise tolerance over the past several months which has increased over the past week. Patient notes that over the past week he has been short of breath even at rest which is unusual for him. He admits to orthopnea and PND at home. Patient has been managed by his PCP with Lasix 40 twice a day and has been told that the Lasix dose was conservative due to concerns about kidney function. Patient is also noted increasing abdominal girth and markedly lower extremity edema which has been treated with wrapping of legs.   This morning patient was markedly short of breath and gasping for air at rest. He had an outpatient M.D. appointment and was told to come to the ED from that appointment. Patient denies any acute chest or shoulder or jaw pain.   Patient denies fevers or chills. No malaise other than his acutely worsening shortness of breath. No sweats. Patient has baseline cough but that has not really changed from usual. No sputum production.    Subjective: 10/4  A/O 4, negative CP, negative abdominal pain, negative N/V. Positive acute on chronic SOB continues to improve. C/O insomnia. Also states sleeping throughout the day.    Assessment & Plan:   Principal Problem:   Acute respiratory failure with hypoxia (HCC) Active Problems:   CAD (coronary artery disease)   Acute combined systolic and diastolic heart  failure (HCC)   Paroxysmal atrial fibrillation (HCC)   Prostate hypertrophy   HAP (hospital-acquired pneumonia)   Acute Respiratory Failure with Hypoxia -on Admission SPO2=64% on room air,placed on BiPAP -consistent with CHF exacerbation.   Acute on Chronic systolic CHF  -9/22 Echocardiogram: C/W systolic CHF and Pulmonary HTN see results below -negative troponin -Coreg 6.25 mg BID -Imdur 15 mg daily -Strict in and out since admission -20 L -Daily weight Filed Weights   11/17/16 0557 11/18/16 0608 11/19/16 0349  Weight: 189 lb (85.7 kg) 190 lb (86.2 kg) 186 lb (84.4 kg)  -Echocardiogram: EF unchanged significantly increased pulmonary HTN see results below  -Continued fluid overload: 10/4 CVP = 9 mmHg -Although CVP still slightly elevated believed patient is his dry as we are going to get him without totally decreasing renal function.  AAA without rupture -Increasing size patient family aware. Has refused further intervention   Pulmonary HTN -See CHF  Chronic Atrial fibrillation(CHADSVASC score 5) -See CHF -Eliquis  -9/25 S/P unsuccessful DCC V  -Currently in NSR  Acute on CKD stage III(Baseline Cr 1.3-1.5)    Recent Labs Lab 11/14/16 0451 11/15/16 0530 11/16/16 0630 11/17/16 0338 11/18/16 0530 11/19/16 0600  CREATININE 2.24* 2.49* 2.56* 2.67* 2.85* 3.06*  -Diuretic discontinue secondary to increasing creatinine  BPH -  Flomax 0.4 mg daily  HYPOTHYROIDISM -Synthroid 75 g daily   GERD Continue Pepcid  LLE w/ 3 areas of full thickness wounds Care as per WOC Team   Goals of care -PALLIATIVE CARE: Awaiting palliative care evaluation and recommendations. -10/4 family agrees with  placement to [facility]: Comfort measures in addition to current level of care NO ESCALATION   DVT prophylaxis: Eliquis Code Status: DO NOT RESUSCITATE Family Communication: None Disposition Plan: TBD   Consultants:  Cardiology PALLIATIVE CARE    Procedures/Significant  Events:  -9/22 Echocardiogram: LVEF= 25% to 30%.- Diffuse hypokinesis worse in the inferolateral and anterolateral myocardium. -Left atrium: The appendage was moderately dilated. -Pulmonary arteries: PA peak pressure: 61 mm Hg (S). 9/25 DCCV: Unsuccessful    I have personally reviewed and interpreted all radiology studies and my findings are as above.  VENTILATOR SETTINGS: None   Cultures 9/22 MRSA by PCR negative     Antimicrobials:Anti-infectives    Start     Stop   11/11/16 1300  ceFEPIme (MAXIPIME) 2 g in dextrose 5 % 50 mL IVPB     11/15/16 1440   11/07/16 1400  vancomycin (VANCOCIN) IVPB 1000 mg/200 mL premix  Status:  Discontinued     11/08/16 1658   11/07/16 1330  ceFEPIme (MAXIPIME) 1 g in dextrose 5 % 50 mL IVPB  Status:  Discontinued     11/08/16 1658   11/06/16 1300  ceFEPIme (MAXIPIME) 2 g in dextrose 5 % 50 mL IVPB     11/06/16 1403   11/06/16 1300  vancomycin (VANCOCIN) 1,750 mg in sodium chloride 0.9 % 500 mL IVPB     11/06/16 1830      Devices    LINES / TUBES:      Continuous Infusions:    Objective: Vitals:   11/18/16 2300 11/19/16 0349 11/19/16 0700 11/19/16 0814  BP: 106/68 107/75 104/71   Pulse: 64 65 68   Resp: 17 (!) 21 18   Temp: 98 F (36.7 C) 97.6 F (36.4 C) 97.6 F (36.4 C)   TempSrc: Oral Oral Oral   SpO2: 97% 96% 95% 96%  Weight:  186 lb (84.4 kg)    Height:        Intake/Output Summary (Last 24 hours) at 11/19/16 1158 Last data filed at 11/19/16 0800  Gross per 24 hour  Intake              350 ml  Output             1850 ml  Net            -1500 ml   Filed Weights   11/17/16 0557 11/18/16 0608 11/19/16 0349  Weight: 189 lb (85.7 kg) 190 lb (86.2 kg) 186 lb (84.4 kg)     Physical Exam:   General: A/O 4, positive acute on chronic respiratory distress Neck:  Negative scars, masses, torticollis, lymphadenopathy, JVD Lungs: clear to auscultation bilaterally, decreased air movement but improved    Cardiovascular: Regular rate and rhythm without murmur gallop or rub normal S1 and S2 Abdomen: negative abdominal pain, nondistended, positive soft, bowel sounds, no rebound, no ascites, no appreciable mass Extremities: No significant cyanosis, clubbing. Negative pedal edema, Skin: Venous stasis ulcers covered and clean. Psychiatric:  Negative depression, negative anxiety, negative fatigue, negative mania  Central nervous system:  Cranial nerves II through XII intact, tongue/uvula midline, all extremities muscle strength 5/5, sensation intact throughout, negative dysarthria, negative expressive aphasia, negative receptive aphasia.   .     Data Reviewed: Care during the described time interval was provided by me .  I have reviewed this patient's available data, including medical history, events of note, physical examination, and all test results as part of my evaluation.   CBC:  Recent  Labs Lab 11/16/16 0630 11/17/16 0630 11/19/16 0600  WBC 7.9 8.9 8.4  HGB 11.0* 11.2* 10.2*  HCT 32.8* 33.8* 30.0*  MCV 98.8 98.5 98.0  PLT 165 159 124*   Basic Metabolic Panel:  Recent Labs Lab 11/15/16 0530 11/16/16 0630 11/17/16 0338 11/18/16 0530 11/19/16 0600  NA 134* 132* 131* 133* 133*  K 4.0 3.9 4.1 4.3 4.3  CL 93* 90* 86* 84* 84*  CO2 33* 33* 35* 37* 39*  GLUCOSE 92 79 143* 122* 102*  BUN 83* 88* 98* 102* 103*  CREATININE 2.49* 2.56* 2.67* 2.85* 3.06*  CALCIUM 8.6* 8.8* 8.7* 9.2 8.9   GFR: Estimated Creatinine Clearance: 16.2 mL/min (A) (by C-G formula based on SCr of 3.06 mg/dL (H)). Liver Function Tests: No results for input(s): AST, ALT, ALKPHOS, BILITOT, PROT, ALBUMIN in the last 168 hours. No results for input(s): LIPASE, AMYLASE in the last 168 hours. No results for input(s): AMMONIA in the last 168 hours. Coagulation Profile: No results for input(s): INR, PROTIME in the last 168 hours. Cardiac Enzymes: No results for input(s): CKTOTAL, CKMB, CKMBINDEX, TROPONINI in  the last 168 hours. BNP (last 3 results) No results for input(s): PROBNP in the last 8760 hours. HbA1C: No results for input(s): HGBA1C in the last 72 hours. CBG:  Recent Labs Lab 11/13/16 0803 11/17/16 0819 11/17/16 1301 11/17/16 1745  GLUCAP 150* 105* 123* 96   Lipid Profile: No results for input(s): CHOL, HDL, LDLCALC, TRIG, CHOLHDL, LDLDIRECT in the last 72 hours. Thyroid Function Tests: No results for input(s): TSH, T4TOTAL, FREET4, T3FREE, THYROIDAB in the last 72 hours. Anemia Panel: No results for input(s): VITAMINB12, FOLATE, FERRITIN, TIBC, IRON, RETICCTPCT in the last 72 hours. Urine analysis:    Component Value Date/Time   COLORURINE RED (A) 10/04/2016 1125   APPEARANCEUR TURBID (A) 10/04/2016 1125   LABSPEC  10/04/2016 1125    TEST NOT REPORTED DUE TO COLOR INTERFERENCE OF URINE PIGMENT   PHURINE  10/04/2016 1125    TEST NOT REPORTED DUE TO COLOR INTERFERENCE OF URINE PIGMENT   GLUCOSEU (A) 10/04/2016 1125    TEST NOT REPORTED DUE TO COLOR INTERFERENCE OF URINE PIGMENT   HGBUR (A) 10/04/2016 1125    TEST NOT REPORTED DUE TO COLOR INTERFERENCE OF URINE PIGMENT   BILIRUBINUR (A) 10/04/2016 1125    TEST NOT REPORTED DUE TO COLOR INTERFERENCE OF URINE PIGMENT   KETONESUR (A) 10/04/2016 1125    TEST NOT REPORTED DUE TO COLOR INTERFERENCE OF URINE PIGMENT   PROTEINUR (A) 10/04/2016 1125    TEST NOT REPORTED DUE TO COLOR INTERFERENCE OF URINE PIGMENT   NITRITE (A) 10/04/2016 1125    TEST NOT REPORTED DUE TO COLOR INTERFERENCE OF URINE PIGMENT   LEUKOCYTESUR (A) 10/04/2016 1125    TEST NOT REPORTED DUE TO COLOR INTERFERENCE OF URINE PIGMENT   Sepsis Labs: (procalcitonin:4,lacticidven:4)  ) No results found for this or any previous visit (from the past 240 hour(s)).       Radiology Studies: No results found.      Scheduled Meds: . apixaban  2.5 mg Oral BID  . atorvastatin  40 mg Oral q1800  . budesonide (PULMICORT) nebulizer solution   0.25 mg Nebulization BID  . carvedilol  6.25 mg Oral BID WC  . famotidine  20 mg Oral QAC breakfast  . feeding supplement (PRO-STAT SUGAR FREE 64)  30 mL Oral BID  . ipratropium-albuterol  3 mL Nebulization TID  . isosorbide mononitrate  15 mg Oral Daily  .  levothyroxine  75 mcg Oral QAC breakfast  . mouth rinse  15 mL Mouth Rinse BID  . multivitamin with minerals  1 tablet Oral Daily  . omega-3 acid ethyl esters  1 g Oral Daily  . polyethylene glycol  17 g Oral Daily  . senna-docusate  1 tablet Oral BID  . sodium chloride flush  10-40 mL Intracatheter Q12H  . tamsulosin  0.4 mg Oral Daily   Continuous Infusions:    LOS: 13 days    Time spent: 40 minutes    WOODS, Roselind Messier, MD [facility] Pager [phone]   If 7PM-7AM, please contact night-coverage [email or web address] Password [email or web address] 11/19/2016, 11:58 AM

## 2016-11-19 NOTE — Consult Note (Signed)
This note was previously entered as progress note. Copied and pasted and reentered as consult note so that it could be co-signed electronically by attending physician. No additional charges entered.                                               Consultation Note Date: 11/19/2016   Patient Name: Connor Diaz  DOB: [birth year]  MRN: [id number]  Age / Sex: 83 y.o., male  PCP: Lucia Gaskins, MD Referring Physician: Allie Bossier, MD  Reason for Consultation: Disposition and Establishing goals of care  HPI/Patient Profile: 83 y.o. male  with past medical history of nonischemic cardiomyopathy, pacemaker placement, CHF (EF 25-30%), a fib, CAD, AAA, CKD stg 3, and HTN. He sustained a hip fracture in March 2018 and has been wheelchair bound since. He has experienced a slow decline since the hip fracture.   He was admitted on 11/06/2016 with severe shortness of breath. On admission he was found to have acute respiratory failure d/t systolic heart failure and probable pneumonia. Throughout admission he has required bipap intermittently, aggressive IV diuretics, and antibiotics. His treatment has been complicated by CKD and rising creatinine.   Palliative care consulted to discuss goals of care with patient's family.   Clinical Assessment and Goals of Care:  Kathie Rhodes, NP and Mariana Kaufman, NP met with patient's wife and their children to discuss goals of care. We introduced the role of palliative care and the differences between hospice and palliative care.   Family shared the decline they have seen in the patient over the previous months d/t his dyspnea and weakness. They were clear that they do not want him to suffer and their goals for him are to be comfortable, maintain dignity, and focus on quality of life. They state that he has shared with them before that these were his goals. He has completed a living will previously and indicated he would not want aggressive measures - no resuscitation or  feeding tubes.   Family is most concerned about placement of patient, knowing that he would not want to spend his end of life somewhere without his wife. At present, his wife is hospitalized and also needs care outside of the hospital. Per [facility] representative, they feel they have a viable option for this family where the two could be placed in a room together while the wife receives rehab and the patient receives palliative care. The family is hopeful this option could work out so that the couple could stay together.   Primary Decision Maker NEXT OF KIN - wife Connor Diaz    SUMMARY OF RECOMMENDATIONS    - Social work consult for referral to [facility]AC per discussion with attending physician   - Comfort measures in addition to current level of care- do not escalate care    Code Status/Advance Care Planning:  DNR   Symptom Management:   - PRN morphine for dyspnea  - PRN ativan for anxiety  Palliative Prophylaxis:   Delirium Protocol, Frequent Pain Assessment and Oral Care  Additional Recommendations (Limitations, Scope, Preferences):  Avoid Hospitalization, Minimize Medications and No Artificial Feeding  Prognosis:   < 6 weeks d/t end stage heart failure, acute on chronic kidney failure, transition in [place] to focus on comfort  Discharge Planning: LTAC for end of life care      Primary Diagnoses: Present on  Admission: . Acute respiratory failure with hypoxia ([place]) . Acute combined systolic and diastolic heart failure ([place]) . CAD (coronary artery disease) . Paroxysmal atrial fibrillation ([facility]) . Prostate hypertrophy   I have reviewed the medical record, interviewed the patient and family, and examined the patient. The following aspects are pertinent.  Past Medical History:  Diagnosis Date  . AAA (abdominal aortic aneurysm) ([place])    a. 02/2009 U/S 4.4 x 4.5 cm  . BPH (benign prostatic hyperplasia)   . CKD (chronic kidney disease), stage III   . Coronary artery disease    . DJD (degenerative joint disease)   . History of tobacco abuse    a. roughly 36 pack years, quit @ age 89.  Marland Kitchen Hypertension   . Hypothyroidism   . Non-ischemic cardiomyopathy ([place])   . Pancreatitis    a. 10/2011 - managed conservatively @ home.  . Persistent atrial fibrillation ([place])   . Symptomatic bradycardia    a. s/p STJ dual chamber PPM   . Systolic heart failure ([place])    02/2013   Social History   Social History  . Marital status: Married    Spouse name: N/A  . Number of children: N/A  . Years of education: N/A   Occupational History  . retired    Social History Main Topics  . Smoking status: Former Smoker    Packs/day: 1.00    Years: 35.00    Types: Cigarettes    Quit date: 12/17/1980  . Smokeless tobacco: Never Used     Comment: Quit at age 38.  Marland Kitchen Alcohol use No  . Drug use: No  . Sexual activity: Not Currently   Other Topics Concern  . None   Social History Narrative   Pt lives in [place] with his wife.  He is retired from [employer].  He is relatively active @ home.   Family History  Problem Relation Age of Onset  . Other Father        Accidental death @ age 29 - box fell on him at work  . Cancer Mother        died @ 25 Bladder cancer   Scheduled Meds: . apixaban  2.5 mg Oral BID  . atorvastatin  40 mg Oral q1800  . budesonide (PULMICORT) nebulizer solution  0.25 mg Nebulization BID  . carvedilol  6.25 mg Oral BID WC  . famotidine  20 mg Oral QAC breakfast  . feeding supplement (PRO-STAT SUGAR FREE 64)  30 mL Oral BID  . ipratropium-albuterol  3 mL Nebulization TID  . isosorbide mononitrate  15 mg Oral Daily  . levothyroxine  75 mcg Oral QAC breakfast  . mouth rinse  15 mL Mouth Rinse BID  . multivitamin with minerals  1 tablet Oral Daily  . omega-3 acid ethyl esters  1 g Oral Daily  . polyethylene glycol  17 g Oral Daily  . senna-docusate  1 tablet Oral BID  . sodium chloride flush  10-40 mL Intracatheter Q12H  . tamsulosin   0.4 mg Oral Daily   Continuous Infusions: PRN Meds:.acetaminophen, albuterol, LORazepam, morphine injection, nitroGLYCERIN, ondansetron (ZOFRAN) IV, sodium chloride flush, traZODone Allergies  Allergen Reactions  . Iodinated Diagnostic Agents Rash and Swelling    Swelling in face, urticaria. Received I[place] 370 (non-ionic) on 10/24/14.  . Isovue [Iopamidol] Itching and Swelling    Pt had slight erythema, itching and facial swelling right ear and right lower chin.  Pt sneezed several times after contrast injection.  Pt was monitored for 1 hr post injection and was given water to drink. Dr Alvester Chou feels he had a very mild reaction.  Premeds are warranted in this patient.  Thanks, J Bohm   Review of Systems  Constitutional: Positive for activity change and fatigue.  Respiratory: Positive for shortness of breath.   Neurological: Positive for weakness.  Psychiatric/Behavioral: Positive for agitation. The patient is nervous/anxious.     Physical Exam  Constitutional: No distress.  Cardiovascular: Normal rate and regular rhythm.   Pulmonary/Chest: Effort normal. No respiratory distress.  Skin: Skin is warm and dry. He is not diaphoretic.    Vital Signs: BP (!) 112/96 (BP Location: Left Arm)   Pulse 81   Temp 97.7 F (36.5 C) (Oral)   Resp (!) 25   Ht _0  (1.778 m)   Wt 84.4 kg (186 lb)   SpO2 94%   BMI 26.69 kg/m  Pain Assessment: No/denies pain POSS *See Group Information*: 1-Acceptable,Awake and alert Pain Score: 6    SpO2: SpO2: 94 % O2 Device:SpO2: 94 % O2 Flow Rate: .O2 Flow Rate (L/min): 7 L/min  IO: Intake/output summary:  Intake/Output Summary (Last 24 hours) at 11/19/16 1607 Last data filed at 11/19/16 1200  Gross per 24 hour  Intake              200 ml  Output             1925 ml  Net            -1725 ml    LBM: Last BM Date: 11/12/16 Baseline Weight: Weight: 86 kg (189 lb 9.5 oz) Most recent weight: Weight: 84.4 kg (186 lb)     Palliative  Assessment/Data:30%     Time In: 15:00 Time Out: 16:30 Time Total: 90 minutes  Greater than 50%  of this time was spent counseling and coordinating care related to the above assessment and plan.  Juel Burrow, DNP, AGNP-C Palliative Medicine Team [phone]  Mariana Kaufman, AGNP-C Palliative Medicine

## 2016-11-19 NOTE — Progress Notes (Signed)
Patient ID: Connor Diaz, male   DOB: April 30, 1923, 83 y.o.   MRN: [id number]     Advanced Heart Failure Rounding Note  Primary Cardiologist: Allred  Subjective:    Failed DCCV 11/10/16. Amiodarone stopped. Coreg increased.   PICC line placed 9/27. Initial CVP 16.  Today, CVP 10.  He diuresed again yesterday on po torsemide and weight down but creatinine up to 3.   Wants to go home. Denies SOB   PCT < 0.01 but BNP 1134.  He has completed abx.   Echo (9/18): EF 25-30%, mild LVH, mildly dilated RV with normal systolic function, PASP 61 mmHg.   Objective:   Weight Range: 186 lb (84.4 kg) Body mass index is 26.69 kg/m.   Vital Signs:   Temp:  [97.6 F (36.4 C)-98.2 F (36.8 C)] 97.6 F (36.4 C) (10/04 0349) Pulse Rate:  [64-86] 65 (10/04 0349) Resp:  [17-23] 21 (10/04 0349) BP: (105-118)/(68-96) 107/75 (10/04 0349) SpO2:  [80 %-98 %] 96 % (10/04 0349) FiO2 (%):  [60 %] 60 % (10/03 1355) Weight:  [186 lb (84.4 kg)] 186 lb (84.4 kg) (10/04 0349) Last BM Date: 11/12/16  Weight change: Filed Weights   11/17/16 0557 11/18/16 0608 11/19/16 0349  Weight: 189 lb (85.7 kg) 190 lb (86.2 kg) 186 lb (84.4 kg)    Intake/Output:   Intake/Output Summary (Last 24 hours) at 11/19/16 0748 Last data filed at 11/19/16 0351  Gross per 24 hour  Intake              590 ml  Output             1850 ml  Net            -1260 ml      Physical Exam  CVP 10  General:  Well appearing. No resp difficulty HEENT: normal Neck: supple. no JVD. Carotids 2+ bilat; no bruits. No lymphadenopathy or thryomegaly appreciated. Cor: PMI nondisplaced. Irregular rate & rhythm. No rubs, gallops or murmurs. Lungs: clear on 9 liters  Abdomen: soft, nontender, nondistended. No hepatosplenomegaly. No bruits or masses. Good bowel sounds. Extremities: no cyanosis, clubbing, rash, edema Neuro: alert & orientedx3, cranial nerves grossly intact. moves all 4 extremities w/o difficulty. Affect pleasant .     Telemetry   A fib 60-70s personally reviewed.    Labs    CBC  Recent Labs  11/17/16 0630 11/19/16 0600  WBC 8.9 8.4  HGB 11.2* 10.2*  HCT 33.8* 30.0*  MCV 98.5 98.0  PLT 159 124*   Basic Metabolic Panel  Recent Labs  11/18/16 0530 11/19/16 0600  NA 133* 133*  K 4.3 4.3  CL 84* 84*  CO2 37* 39*  GLUCOSE 122* 102*  BUN 102* 103*  CREATININE 2.85* 3.06*  CALCIUM 9.2 8.9   Liver Function Tests No results for input(s): AST, ALT, ALKPHOS, BILITOT, PROT, ALBUMIN in the last 72 hours. No results for input(s): LIPASE, AMYLASE in the last 72 hours. Cardiac Enzymes No results for input(s): CKTOTAL, CKMB, CKMBINDEX, TROPONINI in the last 72 hours.  BNP: BNP (last 3 results)  Recent Labs  05/21/16 1140 11/06/16 1106 11/11/16 1030  BNP 962.0* 940.4* 1,137.4*    ProBNP (last 3 results) No results for input(s): PROBNP in the last 8760 hours.  D-Dimer No results for input(s): DDIMER in the last 72 hours. Hemoglobin A1C No results for input(s): HGBA1C in the last 72 hours. Fasting Lipid Panel No results for input(s): CHOL, HDL, LDLCALC, TRIG, CHOLHDL,  LDLDIRECT in the last 72 hours. Thyroid Function Tests No results for input(s): TSH, T4TOTAL, T3FREE, THYROIDAB in the last 72 hours.  Invalid input(s): FREET3  Other results:  Imaging   No results found.  Medications:     Scheduled Medications: . apixaban  2.5 mg Oral BID  . atorvastatin  40 mg Oral q1800  . budesonide (PULMICORT) nebulizer solution  0.25 mg Nebulization BID  . carvedilol  6.25 mg Oral BID WC  . famotidine  20 mg Oral QAC breakfast  . feeding supplement (PRO-STAT SUGAR FREE 64)  30 mL Oral BID  . hydrALAZINE  10 mg Oral TID  . ipratropium-albuterol  3 mL Nebulization TID  . isosorbide mononitrate  15 mg Oral Daily  . levothyroxine  75 mcg Oral QAC breakfast  . mouth rinse  15 mL Mouth Rinse BID  . multivitamin with minerals  1 tablet Oral Daily  . omega-3 acid ethyl esters  1 g  Oral Daily  . polyethylene glycol  17 g Oral Daily  . senna-docusate  1 tablet Oral BID  . sodium chloride flush  10-40 mL Intracatheter Q12H  . tamsulosin  0.4 mg Oral Daily  . torsemide  40 mg Oral Daily    Infusions:   PRN Medications: acetaminophen, albuterol, LORazepam, morphine injection, nitroGLYCERIN, ondansetron (ZOFRAN) IV, sodium chloride flush, traZODone  Patient Profile   Mr Koopmann is 83 year old with chronic systolic heart failure, A fib, and limited mobility.  Admitted with hypoxic respiratory failure and marked volume overload.   Assessment/Plan   1. Acute on chronic systolic CHF: Ischemic cardiomyopathy.  Echo (9/18) with EF 25-30% with D-shaped ventricle suggestive of RV pressure/volume overload.  He is on home oxygen, 3 L by [place].  He has developed symptoms and lower extremity edema over the last 2-3 weeks prior to admission. He diuresed well initially but developed respiratory distress on 9/26 and IV diuresis restarted.  Volume status improving, CVP 10. Renal function worse.  - Hold torsemide today, restart when creatinine starts to trend down.  Hopefully it has plateaued. - Continue Coreg 6.25 mg BID.   - Hold off on ACEI/ARB/ARNI for now with elevated creatinine.  - Hold hydralazine today to promote higher BP with rise in creatinine.  2. CAD: Ischemic cardiomyopathy.  No s/s of ischemia.    - Mild troponin elevation was likely due to demand ischemia from volume overload.   - No S/S ischemia. Continue statin - No ASA given stable CAD and apixaban use.  3. Atrial fibrillation: Persistent since August 2017. He had been on amiodarone prior to admission.  We attempted DCCV this admission but this failed.  Amiodarone therefore stopped. Rate controlled.   - Continue apixaban 2.5 mg twice a day.    4. AKI on CKD stage 3:  BUN/Creatinine rising.CVP 10. Hold torsemide.  Will also hold hydralazine to promote higher BP.  5. Deconditioning: Has been wheelchair-bound since  he fractured his hip in the spring. PT following. Recommending HHPT.   6. Symptomatic bradycardia: Has St Jude dual chamber pacemaker.  With cardiomyopathy, would limit RV pacing. Back up rate decreased to 60 bpm this admit.  No change. 7. Leg ulcers: WOC following.  8. Acute on chronic hypoxemic respiratory failure: Wears 3L [place] at home.  However, initially required 10 L HFNC.  This may be due to pulmonary edema primarily.  Initially concerned for PNA but WBCs and PCT are both low and he is afebrile.  CXR looks like pulmonary edema  but cannot rule out basilar PNA.  Still has some wheezing on exam.  - He has completed a 5 day course of cefepime.  - Continue Duonebs - Got an additional dose of Solumedrol 9/27 - On 9 liters oxygen => try to titrate down further today. Discussed with his nurse to wean today.  9. Delirium: Resolved.  10. DNR/DNI  Length of Stay: 13  Tonye Becket, NP  11/19/2016, 7:48 AM   Patient seen with NP, agree with the above note.  Volume status improved and weight down, but creatinine up to 3. Lungs sound better as well though still on 9L [place]. Less confusion today.   - Wean oxygen as able, on 3 L at home.  - Hold torsemide today, restart when BUN/creatinine trending down.  - Hold hydralazine today to promote higher BP with rise in creatinine.   Still awaiting palliative care consult, order is in.   [name] 11/19/2016 7:58 AM

## 2016-11-20 DIAGNOSIS — E871 Hypo-osmolality and hyponatremia: Secondary | ICD-10-CM

## 2016-11-20 DIAGNOSIS — D649 Anemia, unspecified: Secondary | ICD-10-CM

## 2016-11-20 DIAGNOSIS — E873 Alkalosis: Secondary | ICD-10-CM | POA: Diagnosis present

## 2016-11-20 DIAGNOSIS — Z7189 Other specified counseling: Secondary | ICD-10-CM

## 2016-11-20 DIAGNOSIS — J810 Acute pulmonary edema: Secondary | ICD-10-CM

## 2016-11-20 LAB — BASIC METABOLIC PANEL
Anion gap: 7 (ref 5–15)
BUN: 94 mg/dL — ABNORMAL HIGH (ref 6–20)
CHLORIDE: 88 mmol/L — AB (ref 101–111)
CO2: 37 mmol/L — ABNORMAL HIGH (ref 22–32)
Calcium: 8.5 mg/dL — ABNORMAL LOW (ref 8.9–10.3)
Creatinine, Ser: 2.76 mg/dL — ABNORMAL HIGH (ref 0.61–1.24)
GFR calc non Af Amer: 19 mL/min — ABNORMAL LOW (ref >60)
GFR, EST AFRICAN AMERICAN: 22 mL/min — AB (ref >60)
Glucose, Bld: 97 mg/dL (ref 65–99)
POTASSIUM: 4 mmol/L (ref 3.5–5.1)
SODIUM: 132 mmol/L — AB (ref 135–145)

## 2016-11-20 MED ORDER — PREDNISONE 20 MG PO TABS
40.0000 mg | ORAL_TABLET | Freq: Every day | ORAL | Status: DC
  Administered 2016-11-20: 40 mg via ORAL
  Filled 2016-11-20: qty 2

## 2016-11-20 MED ORDER — SENNOSIDES-DOCUSATE SODIUM 8.6-50 MG PO TABS: 1.0000 | ORAL_TABLET | Freq: Two times a day (BID) | ORAL | Status: AC

## 2016-11-20 MED ORDER — PRO-STAT SUGAR FREE PO LIQD: 30.0000 mL | Freq: Two times a day (BID) | ORAL | 0 refills | Status: AC

## 2016-11-20 MED ORDER — ORAL CARE MOUTH RINSE: 15.0000 mL | Freq: Two times a day (BID) | OROMUCOSAL | 0 refills | Status: AC

## 2016-11-20 MED ORDER — BUDESONIDE 0.25 MG/2ML IN SUSP: 0.2500 mg | Freq: Two times a day (BID) | RESPIRATORY_TRACT | 12 refills | Status: AC

## 2016-11-20 MED ORDER — TORSEMIDE 20 MG PO TABS: 20.0000 mg | ORAL_TABLET | Freq: Every day | ORAL | Status: DC

## 2016-11-20 MED ORDER — PREDNISONE 20 MG PO TABS: 40.0000 mg | ORAL_TABLET | Freq: Every day | ORAL | Status: AC

## 2016-11-20 MED ORDER — TORSEMIDE 20 MG PO TABS
40.0000 mg | ORAL_TABLET | Freq: Every day | ORAL | Status: DC
Start: 2016-11-21 — End: 2016-11-21

## 2016-11-20 MED ORDER — POLYETHYLENE GLYCOL 3350 17 G PO PACK: 17.0000 g | PACK | Freq: Every day | ORAL | 0 refills | Status: AC

## 2016-11-20 MED ORDER — MORPHINE SULFATE (PF) 2 MG/ML IV SOLN: 1.0000 mg | INTRAVENOUS | 0 refills | Status: AC | PRN

## 2016-11-20 MED ORDER — LORAZEPAM 2 MG/ML IJ SOLN: 0.5000 mg | Freq: Three times a day (TID) | INTRAMUSCULAR | 0 refills | Status: AC | PRN

## 2016-11-20 MED ORDER — ISOSORBIDE MONONITRATE ER 30 MG PO TB24: 15.0000 mg | ORAL_TABLET | Freq: Every day | ORAL | Status: AC

## 2016-11-20 MED ORDER — ACETAMINOPHEN 325 MG PO TABS: 650.0000 mg | ORAL_TABLET | ORAL | Status: AC | PRN

## 2016-11-20 NOTE — Progress Notes (Signed)
Desats to 80'S  HFNC increased to 15 , sats  90's, continue to monitor.

## 2016-11-20 NOTE — Progress Notes (Addendum)
Physical Therapy Treatment Patient Details Name: ELMORE HYSLOP MRN: [id number] DOB: 10/20/1923 Today's Date: 11/20/2016    History of Present Illness Mr Wakefield is a 83 year old with a history of AAA, BPH, CKD Stage III, DGD, HTN, hypothyroidism, A fib since 09/2016 , R hip fracture, symptomatic bradycardia St jude dual chamber PPM , and chronic systolic heart failure. Admitted with SOB and 20# weight gain.    PT Comments    Pt admitted with above diagnosis. Pt currently with functional limitations due to balance and endurance deficits. Pt was only able to perform a few exercises before sats down to low 80's on 6LHFNC.  NUrsing in room and incr O2 to [facility] and then to 10 [facility] for pt to tolerate minimal UE and LE exercises.  At end of treatment, had to leave pt on 8LHFNC to keep sats >90%.  Nurse aware.  Pt with very poor reserve for even exercises in bed.  Will continue as pt tolerates. Goals revised today - pt did not meet any of them due to decline is status.  Revised goals.  Pt will benefit from skilled PT to increase their independence and safety with mobility to allow discharge to the venue listed below.    Follow Up Recommendations  Supervision/Assistance - 24 hour;LTACH     Equipment Recommendations  None recommended by PT    Recommendations for Other Services       Precautions / Restrictions Precautions Precautions: Fall Restrictions Weight Bearing Restrictions: No    Mobility  Bed Mobility                  Transfers                    Ambulation/Gait                 Stairs            Wheelchair Mobility    Modified Rankin (Stroke Patients Only)       Balance                                            Cognition Arousal/Alertness: Awake/alert Behavior During Therapy: WFL for tasks assessed/performed Overall Cognitive Status: Impaired/Different from baseline Area of Impairment: Orientation;Memory;Following  commands;Safety/judgement;Problem solving                 Orientation Level: Disoriented to;Place;Time;Situation   Memory: Decreased short-term memory Following Commands: Follows one step commands with increased time Safety/Judgement: Decreased awareness of safety;Decreased awareness of deficits   Problem Solving: Difficulty sequencing;Requires verbal cues;Requires tactile cues;Decreased initiation;Slow processing        Exercises General Exercises - Lower Extremity Ankle Circles/Pumps: AROM;Both;10 reps;Supine Quad Sets: AROM;Both;10 reps;Supine Heel Slides: AROM;Both;15 reps;Supine Hip ABduction/ADduction: AROM;Both;10 reps;Supine Straight Leg Raises: AROM;Both;10 reps;Supine    General Comments        Pertinent Vitals/Pain Pain Assessment: No/denies pain    Home Living                      Prior Function            PT Goals (current goals can now be found in the care plan section) Acute Rehab PT Goals PT Goal Formulation: With patient Time For Goal Achievement: 12/04/16 Potential to Achieve Goals: Good Progress towards PT goals: Not progressing toward goals -  comment (Poor respiratory reserve)    Frequency    Min 3X/week      PT Plan Discharge plan needs to be updated    Co-evaluation              AM-PAC PT "6 Clicks" Daily Activity  Outcome Measure  Difficulty turning over in bed (including adjusting bedclothes, sheets and blankets)?: Unable Difficulty moving from lying on back to sitting on the side of the bed? : Unable Difficulty sitting down on and standing up from a chair with arms (e.g., wheelchair, bedside commode, etc,.)?: Unable Help needed moving to and from a bed to chair (including a wheelchair)?: Total Help needed walking in hospital room?: Total Help needed climbing 3-5 steps with a railing? : Total 6 Click Score: 6    End of Session Equipment Utilized During Treatment: Gait belt;Oxygen (10LHFNC) Activity  Tolerance: Patient limited by fatigue Patient left: with call bell/phone within reach;in bed;with family/visitor present Nurse Communication: Mobility status;Need for lift equipment PT Visit Diagnosis: Other abnormalities of gait and mobility (R26.89);Muscle weakness (generalized) (M62.81)     Time: 3958-4417 PT Time Calculation (min) (ACUTE ONLY): 24 min  Charges:  $Therapeutic Exercise: 23-37 mins                    G Codes:       Cimone Fahey,PT Acute Rehabilitation [phone] (pager)    Berline Lopes 11/20/2016, 12:27 PM

## 2016-11-20 NOTE — Progress Notes (Signed)
Daily Progress Note   Patient Name: Connor Diaz       Date: 11/20/2016 DOB: [birth year]  Age: 83 y.o. MRN#: [id number] Attending Physician: Lucy Antigua, MD Primary Care Physician: Oval Linsey, MD Admit Date: 11/06/2016  Reason for Consultation/Follow-up: Disposition, Establishing goals of care, Non pain symptom management and Terminal Care  Subjective: Patient in bed, visibly uncomfortable. Desats to 75% on room air, on NRB. Encouraged RN to administer prn morphine. Patient breathing improved and sat increased to 99% on nasal cannula after morphine administration.  Discussed disposition and GOC with spouse and caregiver, [name]. GOC remains to provide symptomatic relief to patient with understanding he is at end of life- CHF, renal failure. Do not escalate care. Plan for discharge to LTAC.  Review of Systems  Unable to perform ROS: Acuity of condition    Length of Stay: 14  Current Medications: Scheduled Meds:  . apixaban  2.5 mg Oral BID  . atorvastatin  40 mg Oral q1800  . budesonide (PULMICORT) nebulizer solution  0.25 mg Nebulization BID  . carvedilol  6.25 mg Oral BID WC  . famotidine  20 mg Oral QAC breakfast  . feeding supplement (PRO-STAT SUGAR FREE 64)  30 mL Oral BID  . ipratropium-albuterol  3 mL Nebulization TID  . isosorbide mononitrate  15 mg Oral Daily  . levothyroxine  75 mcg Oral QAC breakfast  . mouth rinse  15 mL Mouth Rinse BID  . multivitamin with minerals  1 tablet Oral Daily  . omega-3 acid ethyl esters  1 g Oral Daily  . polyethylene glycol  17 g Oral Daily  . predniSONE  40 mg Oral Q breakfast  . senna-docusate  1 tablet Oral BID  . sodium chloride flush  10-40 mL Intracatheter Q12H  . tamsulosin  0.4 mg Oral Daily  . [START ON 11/21/2016]  torsemide  40 mg Oral Daily  . traZODone  50 mg Oral QHS    Continuous Infusions:   PRN Meds: acetaminophen, albuterol, LORazepam, morphine injection, nitroGLYCERIN, ondansetron (ZOFRAN) IV, sodium chloride flush  Physical Exam  Constitutional: He appears well-developed and well-nourished. He appears distressed.  Cardiovascular: Normal rate and regular rhythm.   Pulmonary/Chest:  Labored, diminished throughout  Abdominal: Soft.  Neurological:  Lethargic, extremely HOH  Skin: Skin is warm and dry.  Nursing  note and vitals reviewed.           Vital Signs: BP 122/78 (BP Location: Left Arm)   Pulse 73   Temp (!) 96.5 F (35.8 C) (Axillary)   Resp (!) 25   Ht  (1.778 m)   Wt 80.7 kg (178 lb)   SpO2 96%   BMI 25.54 kg/m  SpO2: SpO2: 96 % O2 Device: O2 Device: NRB O2 Flow Rate: O2 Flow Rate (L/min): 15 L/min  Intake/output summary:  Intake/Output Summary (Last 24 hours) at 11/20/16 1304 Last data filed at 11/20/16 1003  Gross per 24 hour  Intake              354 ml  Output              950 ml  Net             -596 ml   LBM: Last BM Date: 11/12/16 Baseline Weight: Weight: 86 kg (189 lb 9.5 oz) Most recent weight: Weight: 80.7 kg (178 lb)       Palliative Assessment/Data:  PPS: 30%   Flowsheet Rows     Most Recent Value  Intake Tab  Referral Department  Hospitalist  Unit at Time of Referral  Intermediate Care Unit  Palliative Care Primary Diagnosis  Cardiac  Date Notified  11/16/16  Palliative Care Type  New Palliative care  Reason for referral  Clarify Goals of Care  Date of Admission  11/06/16  Date first seen by Palliative Care  11/19/16  # of days Palliative referral response time  3 Day(s)  # of days IP prior to Palliative referral  10  Clinical Assessment  Palliative Performance Scale Score  30%  Psychosocial & Spiritual Assessment  Palliative Care Outcomes      Patient Active Problem List   Diagnosis Date Noted  . Alkalosis, metabolic  11/20/2016  . Hyponatremia 11/20/2016  . Normocytic anemia 11/20/2016  . Acute on chronic congestive heart failure (HCC)   . Dyspnea   . Advance care planning   . Palliative care by specialist   . Acute respiratory failure with hypoxia (HCC) 11/06/2016  . HAP (hospital-acquired pneumonia) 11/06/2016  . Hematuria 10/04/2016  . Chronic respiratory failure with hypoxia (HCC) 10/04/2016  . Acute renal failure superimposed on stage 3 chronic kidney disease (HCC) 10/04/2016  . Chronic systolic CHF (congestive heart failure) (HCC) 10/04/2016  . Abdominal aortic aneurysm (AAA) without rupture (HCC)   . Prostate hypertrophy   . Pressure injury of skin 05/22/2016  . Acute systolic CHF (congestive heart failure) (HCC) 05/21/2016  . Acute on chronic respiratory failure with hypoxia (HCC) 05/21/2016  . ARF (acute renal failure) (HCC) 05/21/2016  . Intertrochanteric fracture of right hip (HCC) 04/19/2016  . Malnutrition of moderate degree 04/17/2016  . Diarrhea 04/14/2016  . CKD (chronic kidney disease) stage 3, GFR 30-59 ml/min (HCC) 04/14/2016  . Paroxysmal atrial fibrillation (HCC) 09/27/2015  . Non-ischemic cardiomyopathy (HCC)   . Respiratory failure with hypoxia (HCC) 02/24/2013  . Bronchopneumonia 02/22/2013  . SOB (shortness of breath) 02/22/2013  . Acute combined systolic and diastolic heart failure (HCC) 02/22/2013  . CAP (community acquired pneumonia) 02/22/2013  . Cellulitis of left lower extremity 02/22/2013  . Aneurysm of abdominal vessel (HCC) 03/07/2012  . Bradycardia 03/04/2012  . CAD (coronary artery disease) 03/04/2012  . Pacemaker-St. Jude 11/23/2011    Palliative Care Assessment & Plan   Patient Profile:Connor Diaz is an 83 y.o. male with past medical  history of nonischemic cardiomyopathy, pacemaker placement, CHF (EF 25-30%), a fib, CAD, AAA, CKD stg 3, and HTN who sustained a hip fracture in March 2018 and has been wheelchair bound since. He has experienced a  slow decline since the hip fracture. He was admitted on 11/06/2016 with severe shortness of breath. On admission he was found to have acute respiratory failure d/t systolic heart failure and probable pneumonia. Throughout admission he has required bipap intermittently, aggressive IV diuretics, and antibiotics. His treatment has been complicated by CKD and rising creatinine.   Assessment/Recommendations/Plan   Patient is at end of life with chronic a. Fib, chf, PAH- continue current level of care, with comfort measures, do not escalate care  Planned discharge to LTAC today. GOC is to support patient symptomatically through end of life and receive palliative/hospice support at [facility] in double occupancy room with spouse.   Goals of Care and Additional Recommendations:  Limitations on Scope of Treatment: Avoid Hospitalization and Minimize Medications  Code Status:  DNR  Prognosis:   < 4 weeks  Discharge Planning:  LTAC with Palliative/Hospice  Care plan was discussed with patient's spouse and stepson.  Thank you for allowing the Palliative Medicine Team to assist in the care of this patient.   Time In: 1230 Time Out: 1315 Total Time 45 mins Prolonged Time Billed No      Greater than 50%  of this time was spent counseling and coordinating care related to the above assessment and plan.  Ocie Bob, AGNP-C Palliative Medicine   Please contact Palliative Medicine Team phone at [phone] for questions and concerns.

## 2016-11-20 NOTE — Progress Notes (Signed)
Nutrition Brief Note  Chart reviewed. Pt now transitioning to comfort care.  No further nutrition interventions warranted at this time.  Please re-consult as needed.   Drayke Grabel A. Caidin Heidenreich, RD, LDN, CDE Pager: [phone] After hours Pager: [phone]  

## 2016-11-20 NOTE — Clinical Social Work Note (Signed)
CSW acknowledges consult regarding patient discharging to [facility] with palliative. This has already been arranged and patient has orders to discharge there today.  CSW signing off.  Charlynn Court, CSW [phone]

## 2016-11-20 NOTE — Progress Notes (Signed)
Pt refused to wear Bipap. RT will continue to monitor. 

## 2016-11-20 NOTE — Progress Notes (Signed)
Transport arrangement  with [facility] for pick-up after 7PM made  as per coordinator. Report given to RN.

## 2016-11-20 NOTE — Progress Notes (Signed)
Morphine 2 mg iv given, placed on HFNC 10L sat-96% continue to monitor.

## 2016-11-20 NOTE — Progress Notes (Signed)
Desats to 70's on HFNC15L, placed on 100%NRM sats-98% continue to monitor.

## 2016-11-20 NOTE — Progress Notes (Signed)
Patient ID: Connor Diaz, male   DOB: [birth year], 83 y.o.   MRN: [id number]     Advanced Heart Failure Rounding Note  Primary Cardiologist: Connor Diaz  Subjective:    Failed DCCV 11/10/16. Amiodarone stopped. Coreg increased.   PICC line placed 9/27. Initial CVP 16.    CVP down to 6-7. Yesterday diuretics help. BUN/Creatinine improved.   Denies SOB.    PCT < 0.01 but BNP 1134.  He has completed abx.   Echo (9/18): EF 25-30%, mild LVH, mildly dilated RV with normal systolic function, PASP 61 mmHg.   Objective:   Weight Range: 178 lb (80.7 kg) Body mass index is 25.54 kg/m.   Vital Signs:   Temp:  [97.1 F (36.2 C)-97.9 F (36.6 C)] 97.8 F (36.6 C) (10/05 0410) Pulse Rate:  [65-81] 73 (10/05 0410) Resp:  [17-25] 25 (10/05 0410) BP: (112-136)/(70-96) 136/84 (10/05 0410) SpO2:  [93 %-98 %] 96 % (10/05 0746) Weight:  [178 lb (80.7 kg)] 178 lb (80.7 kg) (10/05 0438) Last BM Date: 11/12/16  Weight change: Filed Weights   11/18/16 0608 11/19/16 0349 11/20/16 0438  Weight: 190 lb (86.2 kg) 186 lb (84.4 kg) 178 lb (80.7 kg)    Intake/Output:   Intake/Output Summary (Last 24 hours) at 11/20/16 0831 Last data filed at 11/20/16 0415  Gross per 24 hour  Intake                0 ml  Output             1225 ml  Net            -1225 ml      Physical Exam  CVP 7.  General:  Elderly.  No resp difficulty HEENT: normal Neck: supple. no JVD. Carotids 2+ bilat; no bruits. No lymphadenopathy or thryomegaly appreciated. Cor: PMI nondisplaced. Irregular rate & rhythm. No rubs, gallops or murmurs. Lungs: clear on 9 liters  Abdomen: soft, nontender, nondistended. No hepatosplenomegaly. No bruits or masses. Good bowel sounds. Extremities: no cyanosis, clubbing, rash, edema.  RUE PICC  Neuro: alert & orientedx3, cranial nerves grossly intact. moves all 4 extremities w/o difficulty. Affect pleasant .    Telemetry   A fib 60-70s . Personally reviewed.    Labs     CBC  Recent Labs  11/19/16 0600  WBC 8.4  HGB 10.2*  HCT 30.0*  MCV 98.0  PLT 124*   Basic Metabolic Panel  Recent Labs  11/19/16 0600 11/20/16 0514  NA 133* 132*  K 4.3 4.0  CL 84* 88*  CO2 39* 37*  GLUCOSE 102* 97  BUN 103* 94*  CREATININE 3.06* 2.76*  CALCIUM 8.9 8.5*   Liver Function Tests No results for input(s): AST, ALT, ALKPHOS, BILITOT, PROT, ALBUMIN in the last 72 hours. No results for input(s): LIPASE, AMYLASE in the last 72 hours. Cardiac Enzymes No results for input(s): CKTOTAL, CKMB, CKMBINDEX, TROPONINI in the last 72 hours.  BNP: BNP (last 3 results)  Recent Labs  05/21/16 1140 11/06/16 1106 11/11/16 1030  BNP 962.0* 940.4* 1,137.4*    ProBNP (last 3 results) No results for input(s): PROBNP in the last 8760 hours.  D-Dimer No results for input(s): DDIMER in the last 72 hours. Hemoglobin A1C No results for input(s): HGBA1C in the last 72 hours. Fasting Lipid Panel No results for input(s): CHOL, HDL, LDLCALC, TRIG, CHOLHDL, LDLDIRECT in the last 72 hours. Thyroid Function Tests No results for input(s): TSH, T4TOTAL, T3FREE, THYROIDAB in the last  72 hours.  Invalid input(s): FREET3  Other results:  Imaging   No results found.  Medications:     Scheduled Medications: . apixaban  2.5 mg Oral BID  . atorvastatin  40 mg Oral q1800  . budesonide (PULMICORT) nebulizer solution  0.25 mg Nebulization BID  . carvedilol  6.25 mg Oral BID WC  . famotidine  20 mg Oral QAC breakfast  . feeding supplement (PRO-STAT SUGAR FREE 64)  30 mL Oral BID  . ipratropium-albuterol  3 mL Nebulization TID  . isosorbide mononitrate  15 mg Oral Daily  . levothyroxine  75 mcg Oral QAC breakfast  . mouth rinse  15 mL Mouth Rinse BID  . multivitamin with minerals  1 tablet Oral Daily  . omega-3 acid ethyl esters  1 g Oral Daily  . polyethylene glycol  17 g Oral Daily  . senna-docusate  1 tablet Oral BID  . sodium chloride flush  10-40 mL Intracatheter  Q12H  . tamsulosin  0.4 mg Oral Daily  . traZODone  50 mg Oral QHS    Infusions:   PRN Medications: acetaminophen, albuterol, LORazepam, morphine injection, nitroGLYCERIN, ondansetron (ZOFRAN) IV, sodium chloride flush  Patient Profile   [name] is 83 year old with chronic systolic heart failure, A fib, and limited mobility.  Admitted with hypoxic respiratory failure and marked volume overload.   Assessment/Plan   1. Acute on chronic systolic CHF: Ischemic cardiomyopathy.  Echo (9/18) with EF 25-30% with D-shaped ventricle suggestive of RV pressure/volume overload.  He is on home oxygen, 3 L by Connor Diaz.  He has developed symptoms and lower extremity edema over the last 2-3 weeks prior to admission. He diuresed well initially but developed respiratory distress on 9/26 and IV diuresis restarted.  CVP 6-7 - Volume status improved. Tomorrow can restart torsemide 40 mg daily. Weight down. Renal function getting better.  - Continue Coreg 6.25 mg BID.   - Hold off on ACEI/ARB/ARNI for now with elevated creatinine.  - Hold hydralazine today to promote higher BP with rise in creatinine.  2. CAD: Ischemic cardiomyopathy.  No s/s of ischemia.    - Mild troponin elevation was likely due to demand ischemia from volume overload.   - No S/S ischemia. Continue statin - No ASA given stable CAD and apixaban use.  3. Atrial fibrillation: Persistent since August 2017. He had been on amiodarone prior to admission.  We attempted DCCV this admission but this failed.  Amiodarone therefore stopped.  Rate controlled.    - Continue apixaban 2.5 mg twice a day.    4. AKI on CKD stage 3:  BUN creatinine trending down.     Will also hold hydralazine to promote higher BP.  5. Deconditioning: Has been wheelchair-bound since he fractured his hip in the spring. PT following. Recommending HHPT.   6. Symptomatic bradycardia: Has St Jude dual chamber pacemaker.  With cardiomyopathy, would limit RV pacing. Back up rate  decreased to 60 bpm this admit.  No change. 7. Leg ulcers: WOC following.  8. Acute on chronic hypoxemic respiratory failure: Wears 3L Connor Diaz at home.  However, initially required 10 L HFNC.  This may be due to pulmonary edema primarily.  Initially concerned for PNA but WBCs and PCT are both low and he is afebrile.  CXR looks like pulmonary edema but cannot rule out basilar PNA.  Still has some wheezing on exam.  - Completed 5 day course of cefepime.  - Continue Duonebs - Got an additional dose  of Solumedrol 9/27 - Oxygen down to 5 liters. Continue to titrate down further today. 9. Delirium: Resolved.  10. DNR/DNI- Palliative Care has seen. Planning on placement .   He has been optimized. From HF perspective ok to go home.   Length of Stay: 14  Tonye Becket, NP  11/20/2016, 8:31 AM   Patient seen with NP, agree with the above note.  Renal function starting to improve.  CVP 7 today, so will remain off diuretics today.  Feeling ok, sitting with his wife having breakfast.   - Start torsemide 40 mg daily tomorrow.  - Would hold off on hydralazine/imdur with soft BP when he was getting these meds.  - From my standpoint, he can go to rehab facility.   [name] 11/20/2016 9:35 AM

## 2016-11-20 NOTE — Discharge Summary (Signed)
Physician Discharge Summary  Connor Diaz:[id number] DOB: [birth year] DOA: 11/06/2016  PCP: Oval Linsey, MD  Admit date: 11/06/2016 Discharge date: 11/20/2016  Admitted From: Home Discharge disposition: [facility]   Recommendations for Outpatient Follow-Up:   1. Will need ongoing volume management and close monitoring of renal function.   Discharge Diagnosis:   Principal Problem:   Acute respiratory failure with hypoxia (HCC) Active Problems:   CAD (coronary artery disease)   Acute combined systolic and diastolic heart failure (HCC)   Paroxysmal atrial fibrillation (HCC)   Prostate hypertrophy   HAP (hospital-acquired pneumonia)   Acute on chronic congestive heart failure (HCC)   Dyspnea   Advance care planning   Palliative care by specialist   Alkalosis, metabolic   Hyponatremia   Normocytic anemia   Goals of care, counseling/discussion    Discharge Condition: Improved.  Diet recommendation: Low sodium, heart healthy.    Wound care: Foam dressing to protect and promote healing.   History of Present Illness:   Connor Diaz is an 83 y.o. male with past medical history of nonischemic cardiomyopathy, pacemaker placement, CHF (EF 25-30%), a fib, CAD, AAA, CKD stg 3, and HTN who sustained a hip fracture in March 2018 and has been wheelchair bound since. He has experienced a slow decline since the hip fracture. He was admitted on 11/06/2016 with severe shortness of breath. On admission he was found to have acute respiratory failure d/t systolic heart failure and probable pneumonia. Throughout admission he has required bipap intermittently, aggressive IV diuretics, and antibiotics. His treatment has been complicated by CKD and rising creatinine.   Hospital Course by Problem:   Principal Problem:   Acute respiratory failure with hypoxia (HCC) Secondary to acute on chronic systolic CHF/contraction alkalosis secondary to diuresis/hyponatremia secondary to  CHF physiology EF 25-30 percent by echo done 11/07/16. Troponin negative. Profoundly hypoxic on admission with a oxygen saturation of 64%, requiring BiPAP. Has diuresed over 21 L since admission. Creatinine beginning to creep up, making additional diuresis risky.  Active problems: Hyponatremia Mild. Maybe from CHF physiology.  AAA without rupture Increasing in size, patient and family aware. Has refused further intervention.   Pulmonary HTN Continue supplemental oxygen.  Chronic Atrial fibrillation(CHADSVASC score 5)  S/P unsuccessful DCCV 11/10/16. Continue Eliquis. Converted spontaneously to normal sinus rhythm.  Acute on CKD stage III(Baseline Cr 1.3-1.5) Creatinine rising. Hold further diuretics.  BPH Continue Flomax 0.4 mg daily.  HYPOTHYROIDISM Continue Synthroid 75 g daily.  GERD Continue Pepcid.  LLE w/ 3 areas of full thickness wounds Care as per WOC Team. Foam dressing to protect and promote healing.   Goals of care Palliative care consultation provided 11/19/16. Plan for placement to [facility]: Comfort measures in addition to current level of care NO ESCALATION.  Normocytic anemia Likely anemia of chronic disease. Hemoglobin stable. No signs of bleeding.   Medical Consultants:    Palliative Care  Cardiology   Discharge Exam:   Vitals:   11/20/16 1008 11/20/16 1403  BP:    Pulse: 73   Resp:    Temp:    SpO2:  98%   Vitals:   11/20/16 0746 11/20/16 0749 11/20/16 1008 11/20/16 1403  BP:  122/78    Pulse:   73   Resp:      Temp:  (!) 96.5 F (35.8 C)    TempSrc:  Axillary    SpO2: 96%   98%  Weight:      Height:  General exam: Frail elderly male who is in no acute distress but with visibly with increased work of breathing. Respiratory system: Audible expiratory wheezes, increased work of breathing.. Cardiovascular system: S1 & S2 heard, RRR. No JVD,  rubs, gallops or clicks. No murmurs. Gastrointestinal system:  Abdomen is nondistended, soft and nontender. No organomegaly or masses felt. Normal bowel sounds heard. Central nervous system: Alert and oriented to self and place. No focal neurological deficits. Extremities: No clubbing,  or cyanosis. No edema. Skin: No rashes, lesions or ulcers. Upper extremity ecchymosis. Wounds to the left lower leg, full thickness. Psychiatry: Judgement and insight appear normal. Mood & affect appropriate.    The results of significant diagnostics from this hospitalization (including imaging, microbiology, ancillary and laboratory) are listed below for reference.     Procedures and Diagnostic Studies:   Dg Chest Portable 1 View  Result Date: 11/06/2016 CLINICAL DATA:  Shortness of breath. EXAM: PORTABLE CHEST 1 VIEW COMPARISON:  Jul 07, 2016 FINDINGS: Study is limited due to rotated lordotic positioning. Cardiomegaly is noted. The hila and mediastinum are unchanged. Mild opacity in the left base is slightly increased in the interval. Opacity in the right base has increased. Mild interstitial prominence on the right. No other acute abnormalities. IMPRESSION: 1. Increasing bibasilar opacities, right greater than left. Developing pneumonia not excluded in the right base. Probable atelectasis in the left base. Electronically Signed   By: Gerome Sam III M.D   On: 11/06/2016 12:10     Labs:   Basic Metabolic Panel:  Recent Labs Lab 11/16/16 0630 11/17/16 0338 11/18/16 0530 11/19/16 0600 11/20/16 0514  NA 132* 131* 133* 133* 132*  K 3.9 4.1 4.3 4.3 4.0  CL 90* 86* 84* 84* 88*  CO2 33* 35* 37* 39* 37*  GLUCOSE 79 143* 122* 102* 97  BUN 88* 98* 102* 103* 94*  CREATININE 2.56* 2.67* 2.85* 3.06* 2.76*  CALCIUM 8.8* 8.7* 9.2 8.9 8.5*   GFR Estimated Creatinine Clearance: 18 mL/min (A) (by C-G formula based on SCr of 2.76 mg/dL (H)).  CBC:  Recent Labs Lab 11/16/16 0630 11/17/16 0630 11/19/16 0600  WBC 7.9 8.9 8.4  HGB 11.0* 11.2* 10.2*  HCT 32.8*  33.8* 30.0*  MCV 98.8 98.5 98.0  PLT 165 159 124*   CBG:  Recent Labs Lab 11/17/16 0819 11/17/16 1301 11/17/16 1745  GLUCAP 105* 123* 96    Discharge Instructions:   Discharge Instructions    (HEART FAILURE PATIENTS) Call MD:  Anytime you have any of the following symptoms: 1) 3 pound weight gain in 24 hours or 5 pounds in 1 week 2) shortness of breath, with or without a dry hacking cough 3) swelling in the hands, feet or stomach 4) if you have to sleep on extra pillows at night in order to breathe.    Complete by:  As directed    Call MD for:  difficulty breathing, headache or visual disturbances    Complete by:  As directed    Call MD for:  extreme fatigue    Complete by:  As directed    Call MD for:  temperature >100.4    Complete by:  As directed    Diet - low sodium heart healthy    Complete by:  As directed    Increase activity slowly    Complete by:  As directed      Allergies as of 11/20/2016      Reactions   Iodinated Diagnostic Agents Rash, Swelling  Swelling in face, urticaria. Received Isovoc 370 (non-ionic) on 10/24/14.   Isovue [iopamidol] Itching, Swelling   Pt had slight erythema, itching and facial swelling right ear and right lower chin.  Pt sneezed several times after contrast injection.  Pt was monitored for 1 hr post injection and was given water to drink. Dr Gery Pray feels he had a very mild reaction.  Premeds are warranted in this patient.  Thanks, J Bohm      Medication List    STOP taking these medications   amiodarone 200 MG tablet Commonly known as:  PACERONE   furosemide 20 MG tablet Commonly known as:  LASIX     TAKE these medications   acetaminophen 325 MG tablet Commonly known as:  TYLENOL Take 2 tablets (650 mg total) by mouth every 4 (four) hours as needed for headache or mild pain.   allopurinol 100 MG tablet Commonly known as:  ZYLOPRIM Take 1 tablet (100 mg total) by mouth daily.   atorvastatin 80 MG tablet Commonly known as:   LIPITOR Take 40 mg by mouth daily at 6 PM.   budesonide 0.25 MG/2ML nebulizer solution Commonly known as:  PULMICORT Take 2 mLs (0.25 mg total) by nebulization 2 (two) times daily.   carvedilol 6.25 MG tablet Commonly known as:  COREG Take 1 tablet (6.25 mg total) by mouth 2 (two) times daily with a meal. What changed:  medication strength  how much to take   ELIQUIS 2.5 MG Tabs tablet Generic drug:  apixaban Take 2.5 mg by mouth 2 (two) times daily.   famotidine 20 MG tablet Commonly known as:  PEPCID Take 20 mg by mouth daily before breakfast.   feeding supplement (PRO-STAT SUGAR FREE 64) Liqd Take 30 mLs by mouth 2 (two) times daily.   fish oil-omega-3 fatty acids 1000 MG capsule Take 1 g by mouth every morning.   ipratropium-albuterol 0.5-2.5 (3) MG/3ML Soln Commonly known as:  DUONEB Take 3 mLs by nebulization 3 (three) times daily.   isosorbide mononitrate 30 MG 24 hr tablet Commonly known as:  IMDUR Take 0.5 tablets (15 mg total) by mouth daily.   levothyroxine 75 MCG tablet Commonly known as:  SYNTHROID, LEVOTHROID Take 75 mcg by mouth daily before breakfast.   LORazepam 2 MG/ML injection Commonly known as:  ATIVAN Inject 0.25 mLs (0.5 mg total) into the vein every 8 (eight) hours as needed for anxiety.   morphine 2 MG/ML injection Inject 0.5-1 mLs (1-2 mg total) into the vein every hour as needed (or for air hunger not controlled w/ BIPAP or O2 support).   mouth rinse Liqd solution 15 mLs by Mouth Rinse route 2 (two) times daily.   multivitamin with minerals Tabs tablet Take 1 tablet by mouth daily. What changed:  when to take this   nitroGLYCERIN 0.4 MG SL tablet Commonly known as:  NITROSTAT Place 1 tablet (0.4 mg total) under the tongue every 5 (five) minutes x 3 doses as needed for chest pain.   polyethylene glycol packet Commonly known as:  MIRALAX / GLYCOLAX Take 17 g by mouth daily.   predniSONE 20 MG tablet Commonly known as:   DELTASONE Take 2 tablets (40 mg total) by mouth daily with breakfast.   senna-docusate 8.6-50 MG tablet Commonly known as:  Senokot-S Take 1 tablet by mouth 2 (two) times daily.   tamsulosin 0.4 MG Caps capsule Commonly known as:  FLOMAX Take 0.4 mg by mouth daily.   torsemide 20 MG tablet Commonly known  as:  DEMADEX Take torsemide 40 mg (2 tablets) daily, alternating with 20 mg (1 tablet) daily.   VENTOLIN HFA 108 (90 Base) MCG/ACT inhaler Generic drug:  albuterol Take 2 puffs by mouth every 6 (six) hours as needed for shortness of breath or wheezing.      Follow-up Information    Oval Linsey, MD Follow up.   Specialty:  Internal Medicine Contact information: [address] [place] [address] [phone]        [facility] Follow up on 11/27/2016.   Specialty:  Cardiology Why:  at 11:30 . Please bring all of your medications to your visit. The code for parking is [phone]. Psychologist, sport and exercise thru [employer] on [address]. Underground parking on your right. Can also park in lower ED lot and enter thru blue awning.  Contact information: [address] [id number] [place] [address] [phone]           Time coordinating discharge: 35 minutes.  Signed:  RAMA,CHRISTINA  Pager [phone] [facility] 11/20/2016, 2:17 PM

## 2016-11-27 ENCOUNTER — Inpatient Hospital Stay (HOSPITAL_COMMUNITY): Payer: Medicare Other

## 2016-12-17 DEATH — deceased

## 2016-12-18 ENCOUNTER — Ambulatory Visit: Payer: Medicare Other | Admitting: Urology

## 2018-01-13 IMAGING — DX DG HIP (WITH OR WITHOUT PELVIS) 2-3V*R*
4 series · 4 of 4 positions shown · non-contrast
Comparison: 04/14/2016, 04/19/2016

CLINICAL DATA: Right hip pain, fall

EXAM:
DG HIP (WITH OR WITHOUT PELVIS) 2-3V RIGHT

[hip ap (1 of 2)]
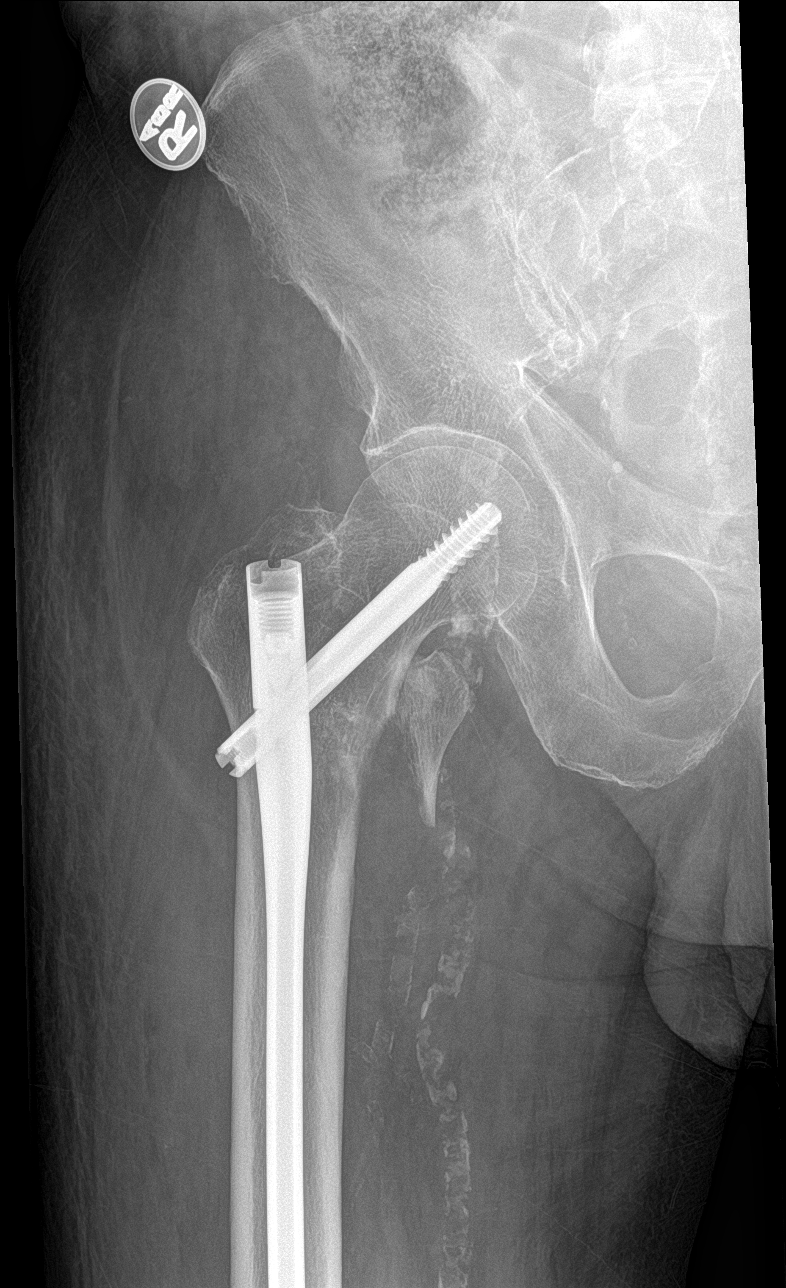

[hip lat]
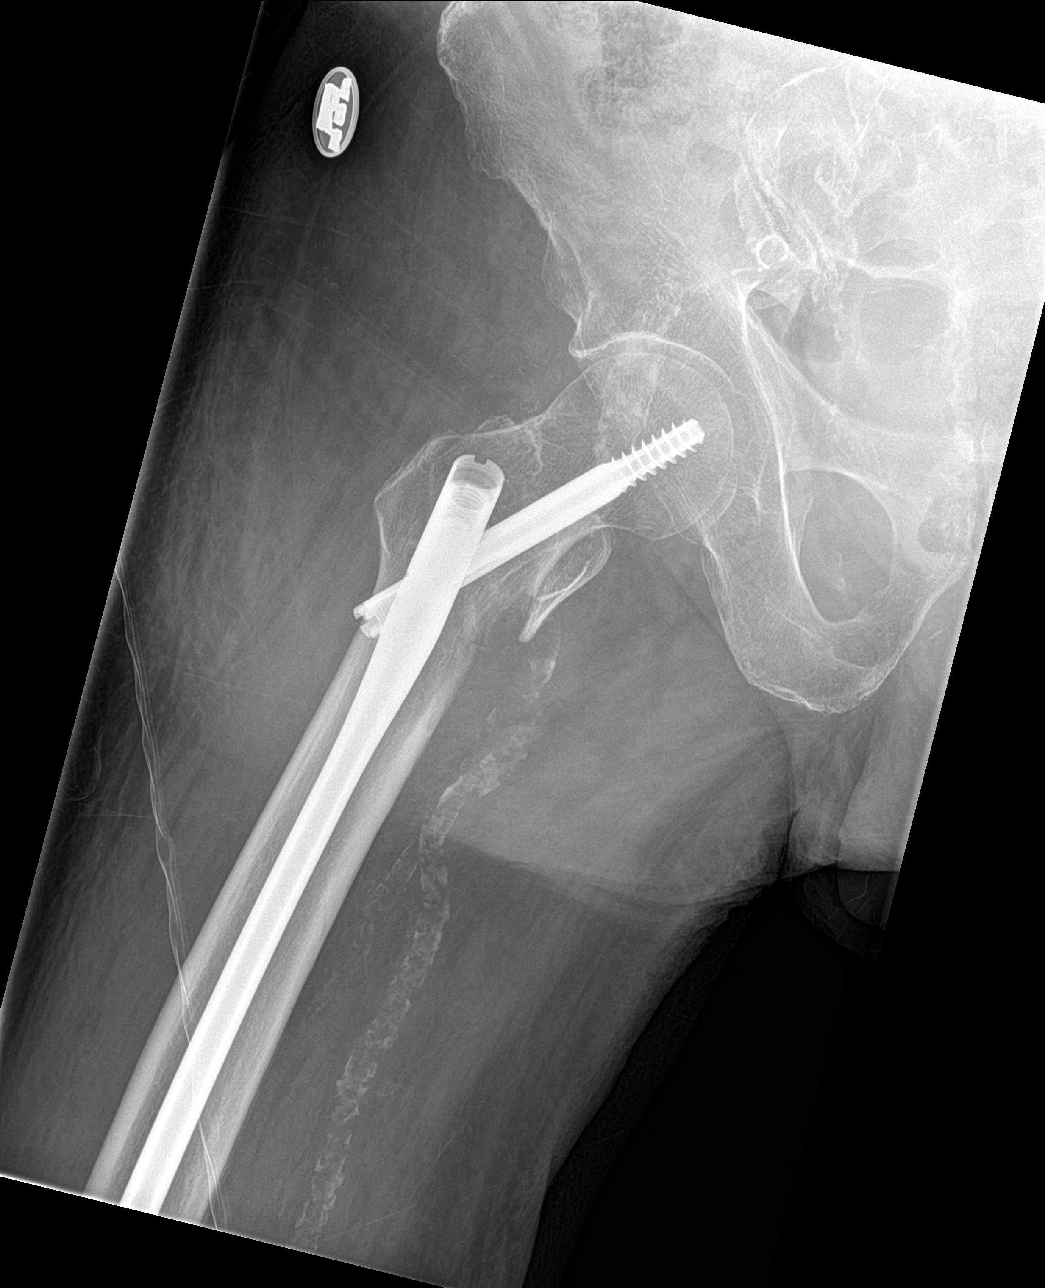

[pelvis ap]
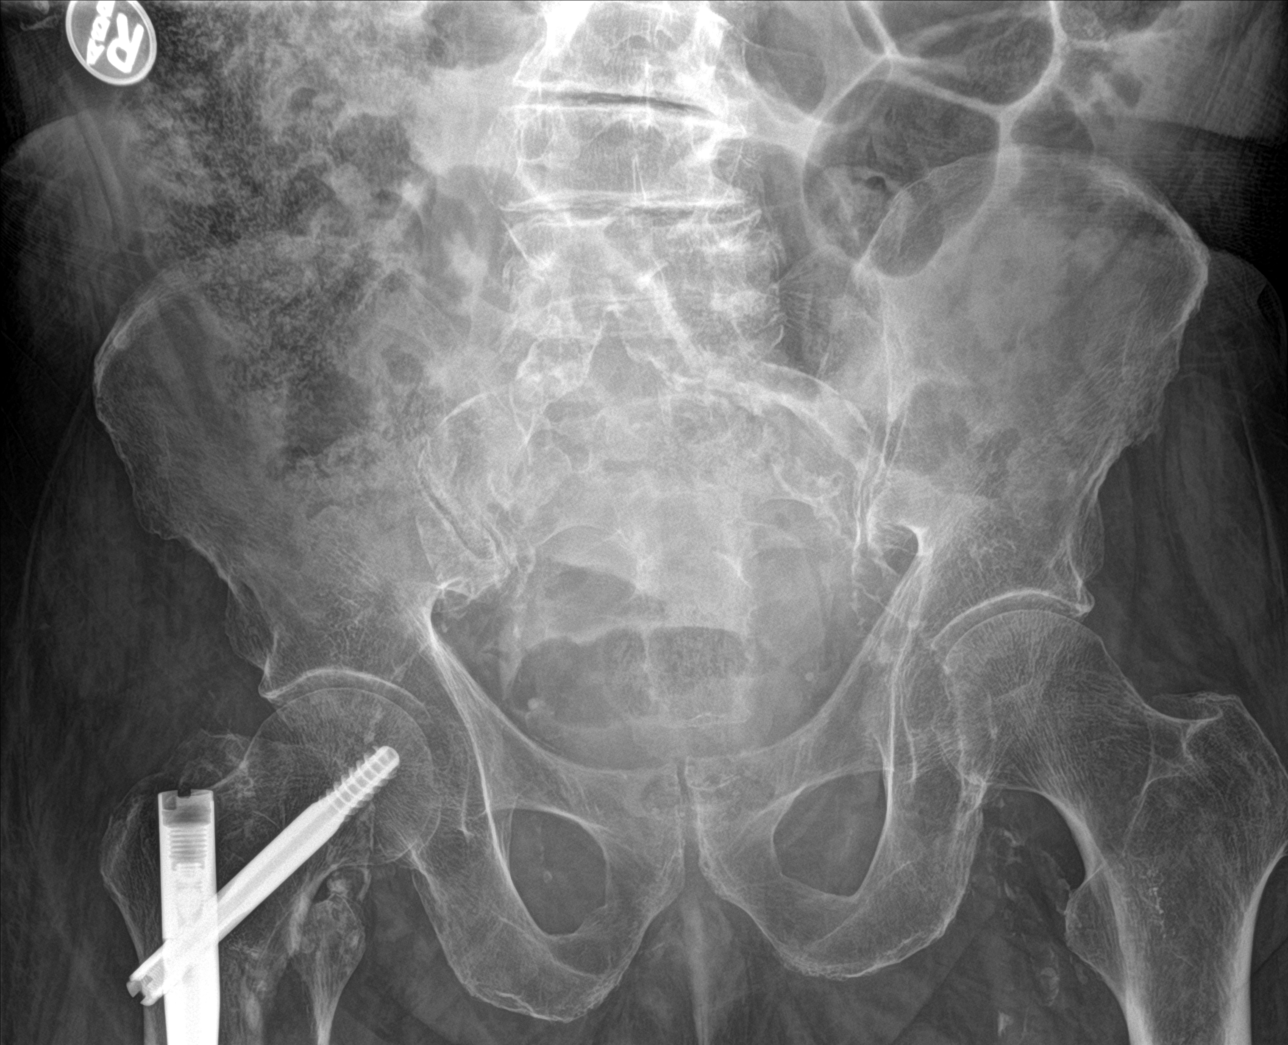

[hip ap (2 of 2)]
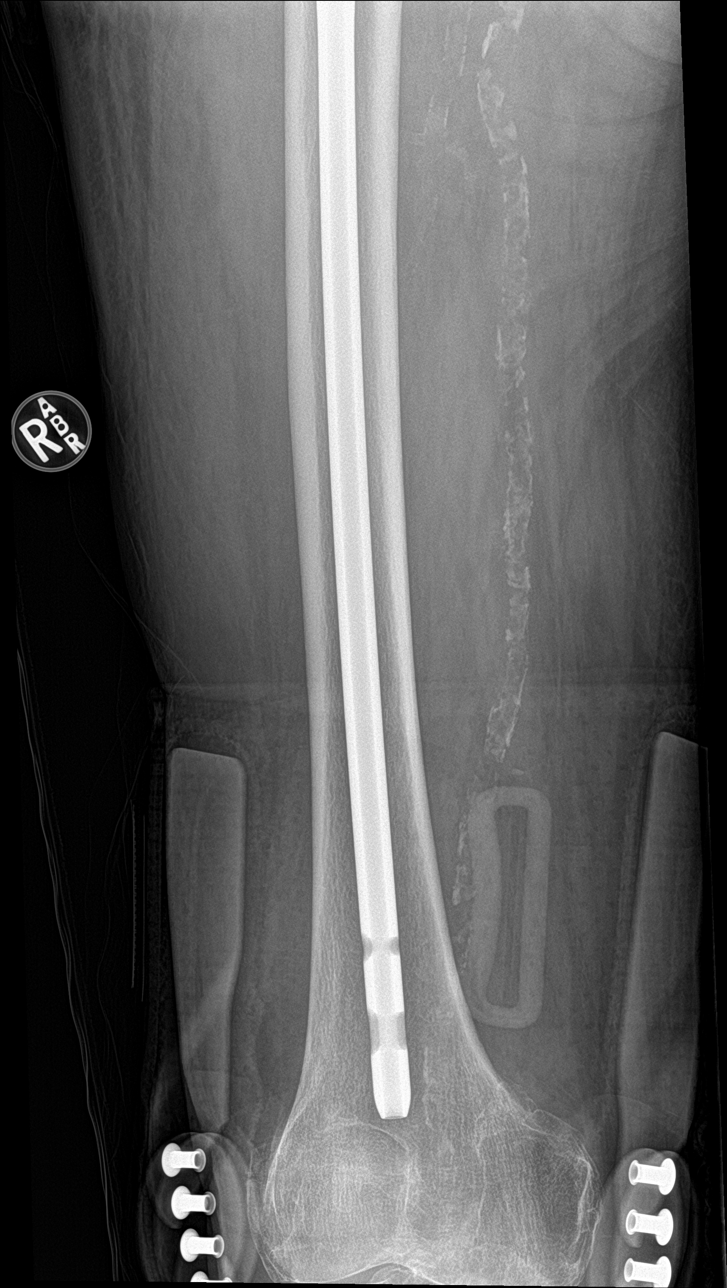

[4 of 4 positions shown; findings below may reference images not displayed]

FINDINGS: Extensive vascular calcifications in the pelvis and proximal thighs.
Pubic symphysis appears intact. SI joint arthritis. Interval
intramedullary rod fixation of previously noted right
intertrochanteric fracture. Displaced lesser trochanteric fracture
is similar compared to prior. Hardware appears intact. No definite
acute fracture is seen. There is soft tissue swelling over the
lateral hip.
IMPRESSION: 1. Intramedullary rod fixation of the right femur across prior
intertrochanteric fracture deformity. No definite acute osseous
abnormality.
2. Extensive vascular calcifications.

## 2018-07-27 IMAGING — DX DG CHEST 1V PORT
1 series · 1 of 1 positions shown · non-contrast
Comparison: November 11, 2016

CLINICAL DATA: Central catheter placement

EXAM:
PORTABLE CHEST 1 VIEW

[chest ap]
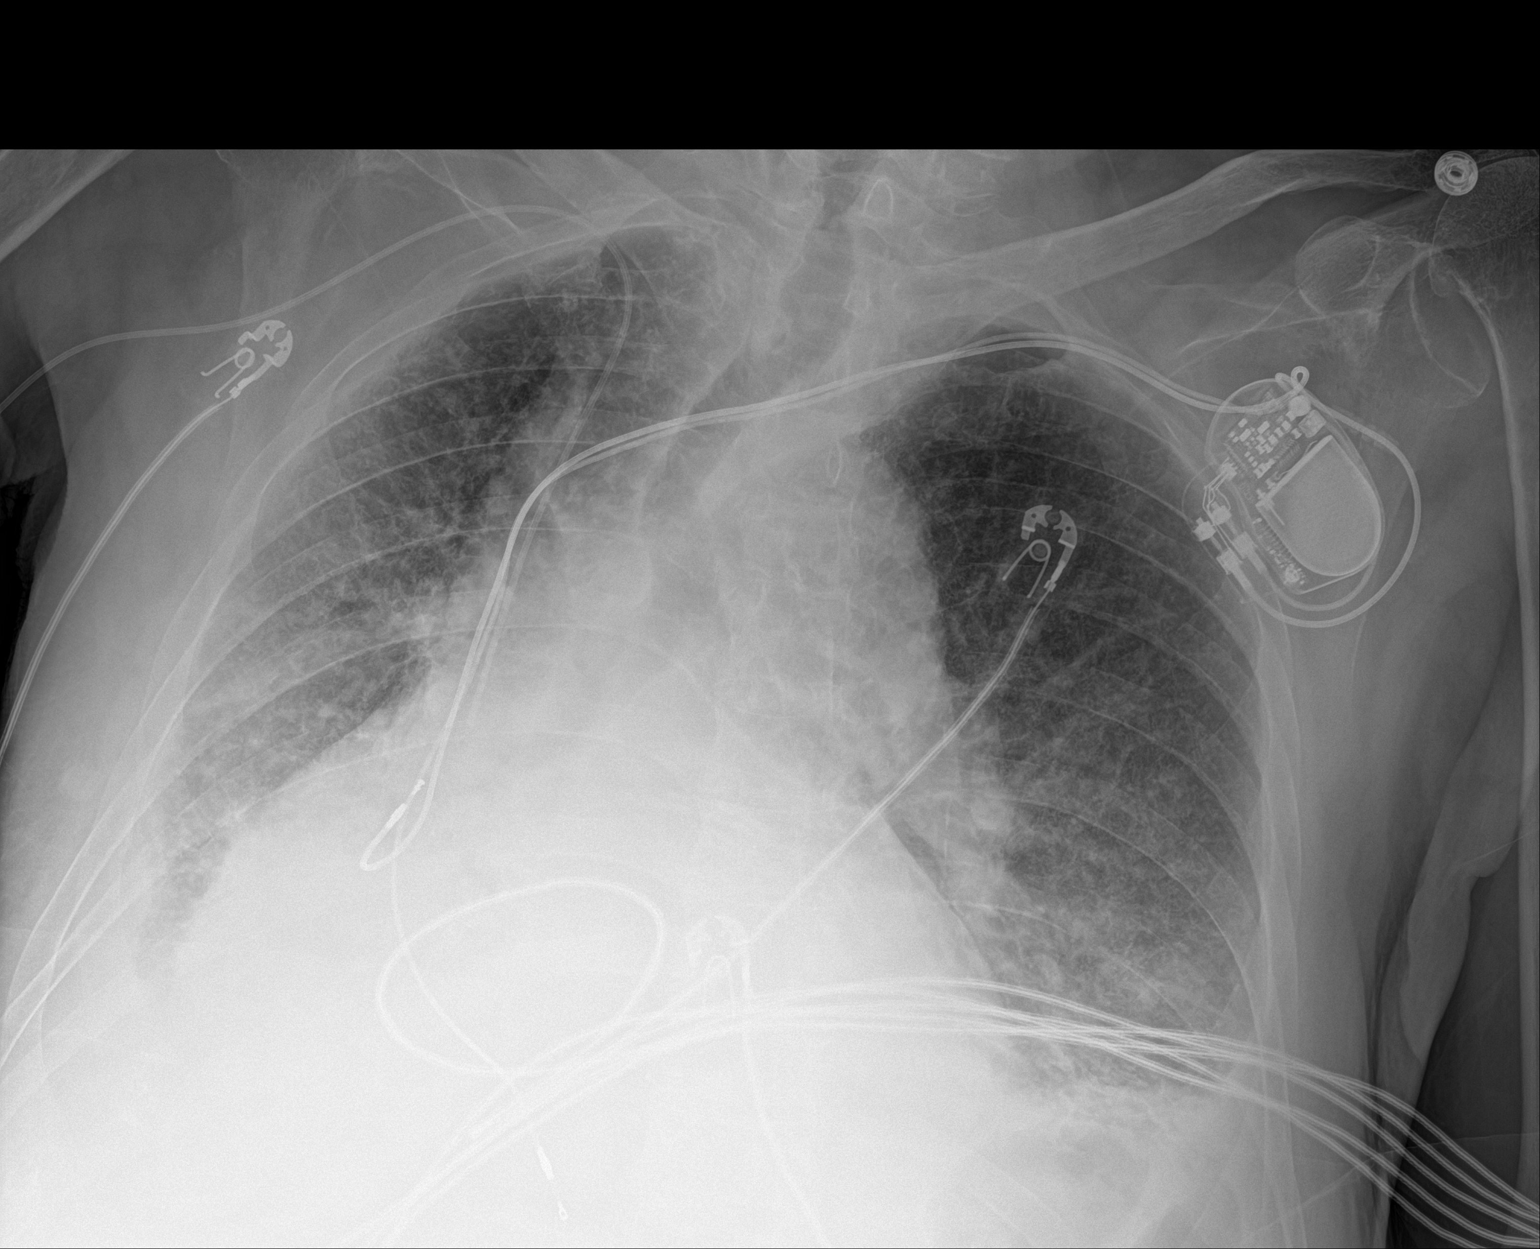

[1 of 1 positions shown; findings below may reference images not displayed]

FINDINGS: Central catheter tip is in the superior vena cava near the
cavoatrial junction. Pacemaker leads are attached to the right
atrium and right ventricle. No pneumothorax.

There remains interstitial pulmonary edema with bibasilar alveolar
consolidation. There are pleural effusions bilaterally, slightly
better seen and felt to be slightly larger than 1 day prior. There
is cardiomegaly with mild pulmonary venous hypertension. There is
aortic atherosclerosis. No evident adenopathy. Calcification is
noted in the coronary arteries. Superior migration of both humeral
heads noted.
IMPRESSION: Central catheter tip in superior vena cava near cavoatrial junction.
No pneumothorax. Changes of congestive heart failure with edema
stable and slight increase in pleural effusions. Stable cardiac
enlargement. Bibasilar alveolar consolidation may represent alveolar
edema or superimposed pneumonia. Both entities may exist
concurrently.

There is aortic atherosclerosis. There is calcification in each
carotid artery.

Aortic Atherosclerosis ([id number]).
